# Patient Record
Sex: Female | Born: 1955
Health system: Southern US, Community
[De-identification: ages and names within clinical notes are randomized; demographics above are authoritative.]

## PROBLEM LIST (undated history)

## (undated) DIAGNOSIS — Z8489 Family history of other specified conditions: Secondary | ICD-10-CM

## (undated) DIAGNOSIS — G43909 Migraine, unspecified, not intractable, without status migrainosus: Secondary | ICD-10-CM

## (undated) DIAGNOSIS — C799 Secondary malignant neoplasm of unspecified site: Secondary | ICD-10-CM

## (undated) DIAGNOSIS — G9050 Complex regional pain syndrome I, unspecified: Secondary | ICD-10-CM

## (undated) DIAGNOSIS — J329 Chronic sinusitis, unspecified: Secondary | ICD-10-CM

## (undated) DIAGNOSIS — C50919 Malignant neoplasm of unspecified site of unspecified female breast: Secondary | ICD-10-CM

## (undated) DIAGNOSIS — M1990 Unspecified osteoarthritis, unspecified site: Secondary | ICD-10-CM

## (undated) DIAGNOSIS — E039 Hypothyroidism, unspecified: Secondary | ICD-10-CM

## (undated) DIAGNOSIS — C7951 Secondary malignant neoplasm of bone: Secondary | ICD-10-CM

## (undated) DIAGNOSIS — Z9221 Personal history of antineoplastic chemotherapy: Secondary | ICD-10-CM

## (undated) DIAGNOSIS — R42 Dizziness and giddiness: Secondary | ICD-10-CM

## (undated) DIAGNOSIS — E7800 Pure hypercholesterolemia, unspecified: Secondary | ICD-10-CM

## (undated) HISTORY — DX: Pure hypercholesterolemia, unspecified: E78.00

## (undated) HISTORY — PX: EYE SURGERY: SHX253

## (undated) HISTORY — DX: Hypothyroidism, unspecified: E03.9

## (undated) HISTORY — DX: Complex regional pain syndrome I, unspecified: G90.50

## (undated) HISTORY — PX: KNEE ARTHROSCOPY: SHX127

## (undated) HISTORY — PX: ROOT CANAL: SHX2363

## (undated) HISTORY — PX: KNEE SURGERY: SHX244

## (undated) HISTORY — DX: Chronic sinusitis, unspecified: J32.9

## (undated) HISTORY — DX: Migraine, unspecified, not intractable, without status migrainosus: G43.909

## (undated) HISTORY — PX: BREAST RECONSTRUCTION: SHX9

## (undated) HISTORY — PX: NASAL SINUS SURGERY: SHX719

## (undated) HISTORY — DX: Malignant neoplasm of unspecified site of unspecified female breast: C50.919

## (undated) HISTORY — PX: SHOULDER SURGERY: SHX246

---
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
## 2000-03-12 ENCOUNTER — Encounter (INDEPENDENT_AMBULATORY_CARE_PROVIDER_SITE_OTHER): Payer: Self-pay | Admitting: Specialist

## 2000-03-12 ENCOUNTER — Other Ambulatory Visit: Admission: RE | Admit: 2000-03-12 | Discharge: 2000-03-12 | Payer: Self-pay | Admitting: *Deleted

## 2001-05-08 ENCOUNTER — Other Ambulatory Visit: Admission: RE | Admit: 2001-05-08 | Discharge: 2001-05-08 | Payer: Self-pay | Admitting: Family Medicine

## 2003-03-08 ENCOUNTER — Ambulatory Visit (HOSPITAL_BASED_OUTPATIENT_CLINIC_OR_DEPARTMENT_OTHER): Admission: RE | Admit: 2003-03-08 | Discharge: 2003-03-08 | Payer: Self-pay | Admitting: Orthopaedic Surgery

## 2003-06-10 ENCOUNTER — Other Ambulatory Visit: Admission: RE | Admit: 2003-06-10 | Discharge: 2003-06-10 | Payer: Self-pay | Admitting: Family Medicine

## 2004-06-14 ENCOUNTER — Other Ambulatory Visit: Admission: RE | Admit: 2004-06-14 | Discharge: 2004-06-14 | Payer: Self-pay | Admitting: Family Medicine

## 2005-06-26 ENCOUNTER — Other Ambulatory Visit: Admission: RE | Admit: 2005-06-26 | Discharge: 2005-06-26 | Payer: Self-pay | Admitting: Family Medicine

## 2006-07-01 ENCOUNTER — Other Ambulatory Visit: Admission: RE | Admit: 2006-07-01 | Discharge: 2006-07-01 | Payer: Self-pay | Admitting: Family Medicine

## 2006-09-11 ENCOUNTER — Ambulatory Visit (HOSPITAL_COMMUNITY): Admission: RE | Admit: 2006-09-11 | Discharge: 2006-09-11 | Payer: Self-pay | Admitting: Family Medicine

## 2008-04-15 DIAGNOSIS — C50919 Malignant neoplasm of unspecified site of unspecified female breast: Secondary | ICD-10-CM

## 2008-04-15 HISTORY — DX: Malignant neoplasm of unspecified site of unspecified female breast: C50.919

## 2008-10-12 ENCOUNTER — Encounter: Admission: RE | Admit: 2008-10-12 | Discharge: 2008-10-12 | Payer: Self-pay | Admitting: Family Medicine

## 2008-12-14 HISTORY — PX: PORTA CATH INSERTION: CATH118285

## 2008-12-21 ENCOUNTER — Encounter: Admission: RE | Admit: 2008-12-21 | Discharge: 2008-12-21 | Payer: Self-pay | Admitting: Radiology

## 2008-12-23 ENCOUNTER — Ambulatory Visit: Payer: Self-pay | Admitting: Oncology

## 2009-01-04 LAB — CBC WITH DIFFERENTIAL/PLATELET
Basophils Absolute: 0 10*3/uL (ref 0.0–0.1)
Eosinophils Absolute: 0 10*3/uL (ref 0.0–0.5)
HCT: 36.3 % (ref 34.8–46.6)
HGB: 12.6 g/dL (ref 11.6–15.9)
MCH: 32.1 pg (ref 25.1–34.0)
MCV: 92.1 fL (ref 79.5–101.0)
MONO%: 8.5 % (ref 0.0–14.0)
NEUT#: 2 10*3/uL (ref 1.5–6.5)
NEUT%: 44.3 % (ref 38.4–76.8)
Platelets: 344 10*3/uL (ref 145–400)
RDW: 12.7 % (ref 11.2–14.5)

## 2009-01-05 LAB — COMPREHENSIVE METABOLIC PANEL
Albumin: 4.4 g/dL (ref 3.5–5.2)
Alkaline Phosphatase: 72 U/L (ref 39–117)
BUN: 18 mg/dL (ref 6–23)
Calcium: 9.5 mg/dL (ref 8.4–10.5)
Creatinine, Ser: 0.74 mg/dL (ref 0.40–1.20)
Glucose, Bld: 92 mg/dL (ref 70–99)
Potassium: 3.9 mEq/L (ref 3.5–5.3)

## 2009-01-05 LAB — LACTATE DEHYDROGENASE: LDH: 145 U/L (ref 94–250)

## 2009-01-05 LAB — VITAMIN D 25 HYDROXY (VIT D DEFICIENCY, FRACTURES): Vit D, 25-Hydroxy: 35 ng/mL (ref 30–89)

## 2009-01-05 LAB — CANCER ANTIGEN 27.29: CA 27.29: 28 U/mL (ref 0–39)

## 2009-01-06 ENCOUNTER — Ambulatory Visit (HOSPITAL_COMMUNITY): Admission: RE | Admit: 2009-01-06 | Discharge: 2009-01-06 | Payer: Self-pay | Admitting: Oncology

## 2009-01-11 ENCOUNTER — Ambulatory Visit: Payer: Self-pay

## 2009-01-11 ENCOUNTER — Encounter: Payer: Self-pay | Admitting: Cardiology

## 2009-01-11 ENCOUNTER — Encounter: Payer: Self-pay | Admitting: Oncology

## 2009-01-13 ENCOUNTER — Ambulatory Visit (HOSPITAL_BASED_OUTPATIENT_CLINIC_OR_DEPARTMENT_OTHER): Admission: RE | Admit: 2009-01-13 | Discharge: 2009-01-13 | Payer: Self-pay | Admitting: General Surgery

## 2009-01-18 ENCOUNTER — Ambulatory Visit (HOSPITAL_COMMUNITY): Admission: RE | Admit: 2009-01-18 | Discharge: 2009-01-18 | Payer: Self-pay | Admitting: Oncology

## 2009-01-25 ENCOUNTER — Ambulatory Visit: Payer: Self-pay | Admitting: Oncology

## 2009-02-03 LAB — CBC WITH DIFFERENTIAL/PLATELET
BASO%: 0.7 % (ref 0.0–2.0)
EOS%: 10.3 % — ABNORMAL HIGH (ref 0.0–7.0)
HCT: 35.5 % (ref 34.8–46.6)
HGB: 12 g/dL (ref 11.6–15.9)
MCH: 30.9 pg (ref 25.1–34.0)
MCHC: 33.8 g/dL (ref 31.5–36.0)
MONO#: 0 10*3/uL — ABNORMAL LOW (ref 0.1–0.9)
NEUT%: 4.4 % — ABNORMAL LOW (ref 38.4–76.8)
RDW: 12.4 % (ref 11.2–14.5)
WBC: 1.4 10*3/uL — ABNORMAL LOW (ref 3.9–10.3)
lymph#: 1.1 10*3/uL (ref 0.9–3.3)

## 2009-02-10 LAB — CBC WITH DIFFERENTIAL/PLATELET
Basophils Absolute: 0 10*3/uL (ref 0.0–0.1)
Eosinophils Absolute: 0 10*3/uL (ref 0.0–0.5)
HCT: 34.5 % — ABNORMAL LOW (ref 34.8–46.6)
HGB: 11.8 g/dL (ref 11.6–15.9)
MONO#: 0.9 10*3/uL (ref 0.1–0.9)
NEUT#: 10.3 10*3/uL — ABNORMAL HIGH (ref 1.5–6.5)
NEUT%: 80.1 % — ABNORMAL HIGH (ref 38.4–76.8)
RDW: 12.7 % (ref 11.2–14.5)
lymph#: 1.6 10*3/uL (ref 0.9–3.3)

## 2009-02-16 LAB — CBC WITH DIFFERENTIAL/PLATELET
Basophils Absolute: 0 10*3/uL (ref 0.0–0.1)
EOS%: 0.2 % (ref 0.0–7.0)
Eosinophils Absolute: 0 10*3/uL (ref 0.0–0.5)
HCT: 32.5 % — ABNORMAL LOW (ref 34.8–46.6)
HGB: 11.5 g/dL — ABNORMAL LOW (ref 11.6–15.9)
MCH: 32.7 pg (ref 25.1–34.0)
NEUT#: 0.9 10*3/uL — ABNORMAL LOW (ref 1.5–6.5)
NEUT%: 60.9 % (ref 38.4–76.8)
lymph#: 0.6 10*3/uL — ABNORMAL LOW (ref 0.9–3.3)

## 2009-02-24 ENCOUNTER — Ambulatory Visit: Payer: Self-pay | Admitting: Oncology

## 2009-02-24 LAB — CBC WITH DIFFERENTIAL/PLATELET
Basophils Absolute: 0.1 10*3/uL (ref 0.0–0.1)
EOS%: 0 % (ref 0.0–7.0)
Eosinophils Absolute: 0 10*3/uL (ref 0.0–0.5)
HCT: 32.7 % — ABNORMAL LOW (ref 34.8–46.6)
HGB: 10.6 g/dL — ABNORMAL LOW (ref 11.6–15.9)
LYMPH%: 9.2 % — ABNORMAL LOW (ref 14.0–49.7)
MCH: 30.5 pg (ref 25.1–34.0)
MCV: 94.2 fL (ref 79.5–101.0)
MONO%: 7.7 % (ref 0.0–14.0)
NEUT%: 82.6 % — ABNORMAL HIGH (ref 38.4–76.8)
Platelets: 194 10*3/uL (ref 145–400)
RDW: 14 % (ref 11.2–14.5)

## 2009-03-03 LAB — CBC WITH DIFFERENTIAL/PLATELET
EOS%: 0.2 % (ref 0.0–7.0)
LYMPH%: 29.8 % (ref 14.0–49.7)
MCH: 32 pg (ref 25.1–34.0)
MCHC: 34.3 g/dL (ref 31.5–36.0)
MCV: 93.3 fL (ref 79.5–101.0)
MONO%: 5.4 % (ref 0.0–14.0)
Platelets: 118 10*3/uL — ABNORMAL LOW (ref 145–400)
RBC: 2.98 10*6/uL — ABNORMAL LOW (ref 3.70–5.45)
RDW: 13.3 % (ref 11.2–14.5)

## 2009-03-08 LAB — COMPREHENSIVE METABOLIC PANEL
ALT: 39 U/L — ABNORMAL HIGH (ref 0–35)
AST: 29 U/L (ref 0–37)
Albumin: 4 g/dL (ref 3.5–5.2)
BUN: 9 mg/dL (ref 6–23)
CO2: 30 mEq/L (ref 19–32)
Calcium: 9.1 mg/dL (ref 8.4–10.5)
Chloride: 104 mEq/L (ref 96–112)
Creatinine, Ser: 0.69 mg/dL (ref 0.40–1.20)
Potassium: 3.6 mEq/L (ref 3.5–5.3)

## 2009-03-08 LAB — CBC WITH DIFFERENTIAL/PLATELET
Basophils Absolute: 0 10*3/uL (ref 0.0–0.1)
HCT: 29.3 % — ABNORMAL LOW (ref 34.8–46.6)
HGB: 9.7 g/dL — ABNORMAL LOW (ref 11.6–15.9)
MONO#: 0.3 10*3/uL (ref 0.1–0.9)
NEUT#: 12.9 10*3/uL — ABNORMAL HIGH (ref 1.5–6.5)
NEUT%: 90 % — ABNORMAL HIGH (ref 38.4–76.8)
RDW: 13.7 % (ref 11.2–14.5)
WBC: 14.3 10*3/uL — ABNORMAL HIGH (ref 3.9–10.3)
lymph#: 1.1 10*3/uL (ref 0.9–3.3)

## 2009-03-16 LAB — CBC WITH DIFFERENTIAL/PLATELET
BASO%: 0.1 % (ref 0.0–2.0)
Basophils Absolute: 0 10*3/uL (ref 0.0–0.1)
HCT: 26.3 % — ABNORMAL LOW (ref 34.8–46.6)
HGB: 9.2 g/dL — ABNORMAL LOW (ref 11.6–15.9)
MONO#: 0 10*3/uL — ABNORMAL LOW (ref 0.1–0.9)
NEUT%: 88.7 % — ABNORMAL HIGH (ref 38.4–76.8)
WBC: 3 10*3/uL — ABNORMAL LOW (ref 3.9–10.3)
lymph#: 0.3 10*3/uL — ABNORMAL LOW (ref 0.9–3.3)

## 2009-03-21 ENCOUNTER — Encounter: Admission: RE | Admit: 2009-03-21 | Discharge: 2009-03-21 | Payer: Self-pay | Admitting: Oncology

## 2009-03-27 ENCOUNTER — Ambulatory Visit: Payer: Self-pay | Admitting: Oncology

## 2009-04-15 HISTORY — PX: MASTECTOMY: SHX3

## 2009-05-16 HISTORY — PX: PORTA CATH REMOVAL: CATH118286

## 2009-05-22 ENCOUNTER — Encounter (INDEPENDENT_AMBULATORY_CARE_PROVIDER_SITE_OTHER): Payer: Self-pay | Admitting: General Surgery

## 2009-05-22 ENCOUNTER — Ambulatory Visit (HOSPITAL_COMMUNITY): Admission: RE | Admit: 2009-05-22 | Discharge: 2009-05-24 | Payer: Self-pay | Admitting: General Surgery

## 2009-05-22 HISTORY — PX: MASTECTOMY: SHX3

## 2009-05-25 ENCOUNTER — Ambulatory Visit: Payer: Self-pay | Admitting: Oncology

## 2009-05-29 LAB — CBC WITH DIFFERENTIAL/PLATELET
BASO%: 0.2 % (ref 0.0–2.0)
EOS%: 5.3 % (ref 0.0–7.0)
HCT: 34.6 % — ABNORMAL LOW (ref 34.8–46.6)
HGB: 11.7 g/dL (ref 11.6–15.9)
MCH: 31.6 pg (ref 25.1–34.0)
MCV: 93.6 fL (ref 79.5–101.0)
MONO%: 8.5 % (ref 0.0–14.0)
NEUT%: 64.5 % (ref 38.4–76.8)
Platelets: 408 10*3/uL — ABNORMAL HIGH (ref 145–400)
lymph#: 1 10*3/uL (ref 0.9–3.3)

## 2009-05-29 LAB — CANCER ANTIGEN 27.29: CA 27.29: 22 U/mL (ref 0–39)

## 2009-05-29 LAB — COMPREHENSIVE METABOLIC PANEL
ALT: 70 U/L — ABNORMAL HIGH (ref 0–35)
BUN: 11 mg/dL (ref 6–23)
CO2: 28 mEq/L (ref 19–32)
Chloride: 102 mEq/L (ref 96–112)
Glucose, Bld: 119 mg/dL — ABNORMAL HIGH (ref 70–99)
Potassium: 4 mEq/L (ref 3.5–5.3)
Sodium: 140 mEq/L (ref 135–145)

## 2009-05-29 LAB — VITAMIN D 25 HYDROXY (VIT D DEFICIENCY, FRACTURES): Vit D, 25-Hydroxy: 39 ng/mL (ref 30–89)

## 2009-05-29 LAB — LACTATE DEHYDROGENASE: LDH: 137 U/L (ref 94–250)

## 2009-07-14 ENCOUNTER — Ambulatory Visit: Payer: Self-pay | Admitting: Oncology

## 2009-07-18 LAB — HEPATIC FUNCTION PANEL
Bilirubin, Direct: 0.1 mg/dL (ref 0.0–0.3)
Total Bilirubin: 0.4 mg/dL (ref 0.3–1.2)

## 2009-07-18 LAB — GAMMA GT: GGT: 17 U/L (ref 7–51)

## 2009-08-14 ENCOUNTER — Other Ambulatory Visit: Admission: RE | Admit: 2009-08-14 | Discharge: 2009-08-14 | Payer: Self-pay | Admitting: Family Medicine

## 2009-11-24 ENCOUNTER — Ambulatory Visit: Payer: Self-pay | Admitting: Oncology

## 2009-11-28 LAB — CBC WITH DIFFERENTIAL/PLATELET
BASO%: 0.5 % (ref 0.0–2.0)
Basophils Absolute: 0 10*3/uL (ref 0.0–0.1)
HCT: 38 % (ref 34.8–46.6)
MCH: 32.2 pg (ref 25.1–34.0)
MONO%: 8.9 % (ref 0.0–14.0)
NEUT%: 52.5 % (ref 38.4–76.8)
Platelets: 356 10*3/uL (ref 145–400)
RDW: 12.7 % (ref 11.2–14.5)
WBC: 4 10*3/uL (ref 3.9–10.3)
lymph#: 1.4 10*3/uL (ref 0.9–3.3)

## 2009-11-28 LAB — COMPREHENSIVE METABOLIC PANEL
ALT: 10 U/L (ref 0–35)
Albumin: 4.6 g/dL (ref 3.5–5.2)
Alkaline Phosphatase: 79 U/L (ref 39–117)
BUN: 19 mg/dL (ref 6–23)
CO2: 25 mEq/L (ref 19–32)
Creatinine, Ser: 0.81 mg/dL (ref 0.40–1.20)
Glucose, Bld: 98 mg/dL (ref 70–99)
Sodium: 140 mEq/L (ref 135–145)
Total Protein: 7 g/dL (ref 6.0–8.3)

## 2009-11-28 LAB — CANCER ANTIGEN 27.29: CA 27.29: 25 U/mL (ref 0–39)

## 2009-11-28 LAB — VITAMIN D 25 HYDROXY (VIT D DEFICIENCY, FRACTURES): Vit D, 25-Hydroxy: 47 ng/mL (ref 30–89)

## 2009-11-28 LAB — LACTATE DEHYDROGENASE: LDH: 130 U/L (ref 94–250)

## 2009-12-06 ENCOUNTER — Ambulatory Visit (HOSPITAL_BASED_OUTPATIENT_CLINIC_OR_DEPARTMENT_OTHER): Admission: RE | Admit: 2009-12-06 | Discharge: 2009-12-06 | Payer: Self-pay | Admitting: Plastic Surgery

## 2010-02-21 ENCOUNTER — Ambulatory Visit: Payer: Self-pay | Admitting: Pain Medicine

## 2010-02-21 ENCOUNTER — Other Ambulatory Visit: Payer: Self-pay | Admitting: Pain Medicine

## 2010-05-06 ENCOUNTER — Encounter: Payer: Self-pay | Admitting: Oncology

## 2010-05-18 ENCOUNTER — Other Ambulatory Visit: Payer: Self-pay | Admitting: Oncology

## 2010-05-18 DIAGNOSIS — Z853 Personal history of malignant neoplasm of breast: Secondary | ICD-10-CM

## 2010-05-18 DIAGNOSIS — Z780 Asymptomatic menopausal state: Secondary | ICD-10-CM

## 2010-05-18 DIAGNOSIS — M898X9 Other specified disorders of bone, unspecified site: Secondary | ICD-10-CM

## 2010-05-23 ENCOUNTER — Ambulatory Visit: Payer: Self-pay | Admitting: Pain Medicine

## 2010-05-31 ENCOUNTER — Other Ambulatory Visit: Payer: Self-pay | Admitting: Oncology

## 2010-05-31 ENCOUNTER — Encounter (HOSPITAL_BASED_OUTPATIENT_CLINIC_OR_DEPARTMENT_OTHER): Payer: 59 | Admitting: Oncology

## 2010-05-31 DIAGNOSIS — C50319 Malignant neoplasm of lower-inner quadrant of unspecified female breast: Secondary | ICD-10-CM

## 2010-05-31 LAB — CBC WITH DIFFERENTIAL/PLATELET
BASO%: 0.5 % (ref 0.0–2.0)
EOS%: 1.8 % (ref 0.0–7.0)
HCT: 36.8 % (ref 34.8–46.6)
LYMPH%: 34.5 % (ref 14.0–49.7)
MCH: 32.2 pg (ref 25.1–34.0)
MCHC: 34.5 g/dL (ref 31.5–36.0)
MCV: 93.3 fL (ref 79.5–101.0)
MONO%: 7.6 % (ref 0.0–14.0)
Platelets: 348 10*3/uL (ref 145–400)
RBC: 3.95 10*6/uL (ref 3.70–5.45)
WBC: 4.6 10*3/uL (ref 3.9–10.3)
lymph#: 1.6 10*3/uL (ref 0.9–3.3)

## 2010-06-01 LAB — COMPREHENSIVE METABOLIC PANEL
ALT: 12 U/L (ref 0–35)
AST: 18 U/L (ref 0–37)
Albumin: 4.5 g/dL (ref 3.5–5.2)
Alkaline Phosphatase: 81 U/L (ref 39–117)
BUN: 21 mg/dL (ref 6–23)
CO2: 29 mEq/L (ref 19–32)
Calcium: 9.4 mg/dL (ref 8.4–10.5)
Chloride: 101 mEq/L (ref 96–112)
Creatinine, Ser: 0.7 mg/dL (ref 0.40–1.20)
Glucose, Bld: 114 mg/dL — ABNORMAL HIGH (ref 70–99)
Potassium: 4.1 mEq/L (ref 3.5–5.3)
Sodium: 139 mEq/L (ref 135–145)
Total Bilirubin: 0.3 mg/dL (ref 0.3–1.2)
Total Protein: 6.8 g/dL (ref 6.0–8.3)

## 2010-06-01 LAB — VITAMIN D 25 HYDROXY (VIT D DEFICIENCY, FRACTURES): Vit D, 25-Hydroxy: 49 ng/mL (ref 30–89)

## 2010-06-01 LAB — CANCER ANTIGEN 27.29: CA 27.29: 22 U/mL (ref 0–39)

## 2010-06-07 ENCOUNTER — Encounter (HOSPITAL_BASED_OUTPATIENT_CLINIC_OR_DEPARTMENT_OTHER): Payer: 59 | Admitting: Oncology

## 2010-06-07 DIAGNOSIS — C50319 Malignant neoplasm of lower-inner quadrant of unspecified female breast: Secondary | ICD-10-CM

## 2010-06-07 DIAGNOSIS — Z170 Estrogen receptor positive status [ER+]: Secondary | ICD-10-CM

## 2010-06-25 ENCOUNTER — Ambulatory Visit
Admission: RE | Admit: 2010-06-25 | Discharge: 2010-06-25 | Disposition: A | Discharge status: Home / Self Care | Payer: 59 | Source: Ambulatory Visit | Attending: Chiropractic Medicine | Admitting: Chiropractic Medicine

## 2010-06-25 ENCOUNTER — Other Ambulatory Visit: Payer: Self-pay | Admitting: Chiropractic Medicine

## 2010-06-25 DIAGNOSIS — M542 Cervicalgia: Secondary | ICD-10-CM

## 2010-06-25 DIAGNOSIS — M5416 Radiculopathy, lumbar region: Secondary | ICD-10-CM

## 2010-07-04 LAB — COMPREHENSIVE METABOLIC PANEL
AST: 36 U/L (ref 0–37)
Albumin: 4.3 g/dL (ref 3.5–5.2)
Alkaline Phosphatase: 68 U/L (ref 39–117)
Chloride: 101 mEq/L (ref 96–112)
Creatinine, Ser: 0.78 mg/dL (ref 0.4–1.2)
GFR calc Af Amer: >60 mL/min (ref >60)
Potassium: 3.6 mEq/L (ref 3.5–5.1)
Total Bilirubin: 0.4 mg/dL (ref 0.3–1.2)
Total Protein: 7.1 g/dL (ref 6.0–8.3)

## 2010-07-04 LAB — CBC
HCT: 26.9 % — ABNORMAL LOW (ref 36.0–46.0)
Hemoglobin: 9.1 g/dL — ABNORMAL LOW (ref 12.0–15.0)
MCHC: 34 g/dL (ref 30.0–36.0)
MCV: 96.2 fL (ref 78.0–100.0)
Platelets: 316 10*3/uL (ref 150–400)
RBC: 2.8 MIL/uL — ABNORMAL LOW (ref 3.87–5.11)
RDW: 14.4 % (ref 11.5–15.5)
WBC: 3.7 10*3/uL — ABNORMAL LOW (ref 4.0–10.5)
WBC: 6.4 10*3/uL (ref 4.0–10.5)

## 2010-07-04 LAB — DIFFERENTIAL
Basophils Absolute: 0 10*3/uL (ref 0.0–0.1)
Eosinophils Relative: 2 % (ref 0–5)
Lymphocytes Relative: 33 % (ref 12–46)
Monocytes Absolute: 0.4 10*3/uL (ref 0.1–1.0)
Monocytes Relative: 10 % (ref 3–12)

## 2010-07-04 LAB — URINALYSIS, ROUTINE W REFLEX MICROSCOPIC
Bilirubin Urine: NEGATIVE
Glucose, UA: NEGATIVE mg/dL
Hgb urine dipstick: NEGATIVE
Specific Gravity, Urine: 1.012 (ref 1.005–1.030)
Urobilinogen, UA: 0.2 mg/dL (ref 0.0–1.0)

## 2010-07-04 LAB — CANCER ANTIGEN 27.29: CA 27.29: 26 U/mL (ref 0–39)

## 2010-07-19 LAB — POCT HEMOGLOBIN-HEMACUE: Hemoglobin: 13.3 g/dL (ref 12.0–15.0)

## 2010-08-16 ENCOUNTER — Other Ambulatory Visit: Payer: Self-pay | Admitting: Physician Assistant

## 2010-08-16 ENCOUNTER — Other Ambulatory Visit (HOSPITAL_COMMUNITY)
Admission: RE | Admit: 2010-08-16 | Discharge: 2010-08-16 | Disposition: A | Discharge status: Home / Self Care | Payer: 59 | Source: Ambulatory Visit | Attending: Family Medicine | Admitting: Family Medicine

## 2010-08-16 DIAGNOSIS — Z124 Encounter for screening for malignant neoplasm of cervix: Secondary | ICD-10-CM | POA: Insufficient documentation

## 2010-08-31 NOTE — Op Note (Signed)
NAME:  APPLEErandi, Lemma                           ACCOUNT NO.:  [id number]   MEDICAL RECORD NO.:  [id number]                   PATIENT TYPE:  AMB   LOCATION:  DSC                                  FACILITY:  [facility]   PHYSICIAN:  Lubertha Basque. Jerl Santos, M.D.             DATE OF BIRTH:  1955/09/18   DATE OF PROCEDURE:  03/08/2003  DATE OF DISCHARGE:                                 OPERATIVE REPORT   PREOPERATIVE DIAGNOSES:  1. Right shoulder impingement.  2. Right shoulder acromioclavicular joint pain.   POSTOPERATIVE DIAGNOSES:  1. Right shoulder impingement.  2. Right shoulder acromioclavicular joint pain.   PROCEDURE:  1. Right shoulder arthroscopic acromioplasty.  2. Right shoulder AC resection.   ANESTHESIA:  General and block.   SURGEON:  Lubertha Basque. Jerl Santos, M.D.   ASSISTANT:  Lindwood Qua, P.A.   INDICATIONS FOR PROCEDURE:  The patient is a 55 year old woman with 5 or 6  months of right shoulder pain. This has persisted despite oral anti-  inflammatories and an exercise program. She responded in a transient fashion  to a subacromial injection. By x-ray and MRI she has things consistent with  impingement. She has pain at rest and pain with activity and is offered an  arthroscopy. Informed operative consent was obtained after a discussion of  possible complications of reaction to anesthesia and infection.   DESCRIPTION OF PROCEDURE:  The patient was brought to the operating suite  where general anesthesia was applied without difficulty. She was also given  a block in the preanesthesia area. She was positioned in the beachchair  position and prepped and draped in the normal sterile fashion.   After administration of preoperative IV antibiotics, an arthroscopy of the  right shoulder was performed through a total of 3 portals. The glenohumeral  joint showed no degenerative  change  and  the  biceps tendon and labral  structures were all intact. The rotator cuff appeared  benign  from below. In  the subacromial space she had an abundance of bursitis and some things  consistent with partial thickness rotator cuff tear. A thorough debridement  was done of the subacromial space. No full thickness tear was seen  consistent with her MRI.   She had a prominent subacromial morphology addressed with an acromioplasty  back  to a flat surface. This was done with the bur in the lateral  position, followed  by transfer of the bur to the posterior position. She  did have bone-on-bone contact at the acromioclavicular joint and a superior  spur on the distal clavicle. An arthroscopic AC resection done through the  additional anterior portal.   The shoulder was thoroughly irrigated at the end of the case, followed  by  placement of epinephrine and morphine  at the end of the case. Simple  sutures of nylon loosely  reapproximated the portals followed  by Adaptic  and a dry gauze  dressing  with tape.  Estimated blood loss and  intraoperative fluids can be obtained  from the anesthesia records.   DISPOSITION:  The patient was extubated in the operating room and taken to  the recovery room in stable condition. Plans for her to go home on the same  day and follow up in the office in one week. I will contact her  by phone  tonight.                                               Lubertha Basque Jerl Santos, M.D.    PGD/MEDQ  D:  03/08/2003  T:  03/08/2003  Job:  [id number]

## 2010-09-04 ENCOUNTER — Other Ambulatory Visit: Payer: Self-pay | Admitting: Dermatology

## 2010-10-19 ENCOUNTER — Ambulatory Visit
Admission: RE | Admit: 2010-10-19 | Discharge: 2010-10-19 | Disposition: A | Discharge status: Home / Self Care | Payer: 59 | Source: Ambulatory Visit | Attending: Oncology | Admitting: Oncology

## 2010-10-19 DIAGNOSIS — Z853 Personal history of malignant neoplasm of breast: Secondary | ICD-10-CM

## 2010-10-19 DIAGNOSIS — Z780 Asymptomatic menopausal state: Secondary | ICD-10-CM

## 2010-11-29 ENCOUNTER — Encounter (HOSPITAL_BASED_OUTPATIENT_CLINIC_OR_DEPARTMENT_OTHER): Payer: 59 | Admitting: Oncology

## 2010-11-29 ENCOUNTER — Other Ambulatory Visit: Payer: Self-pay | Admitting: Oncology

## 2010-11-29 DIAGNOSIS — Z170 Estrogen receptor positive status [ER+]: Secondary | ICD-10-CM

## 2010-11-29 DIAGNOSIS — C50319 Malignant neoplasm of lower-inner quadrant of unspecified female breast: Secondary | ICD-10-CM

## 2010-11-29 LAB — COMPREHENSIVE METABOLIC PANEL
ALT: 12 U/L (ref 0–35)
AST: 17 U/L (ref 0–37)
BUN: 17 mg/dL (ref 6–23)
Calcium: 9.5 mg/dL (ref 8.4–10.5)
Chloride: 105 mEq/L (ref 96–112)
Creatinine, Ser: 0.76 mg/dL (ref 0.50–1.10)
Total Bilirubin: 0.4 mg/dL (ref 0.3–1.2)

## 2010-11-29 LAB — CBC WITH DIFFERENTIAL/PLATELET
BASO%: 0.7 % (ref 0.0–2.0)
Basophils Absolute: 0 10*3/uL (ref 0.0–0.1)
EOS%: 2.3 % (ref 0.0–7.0)
HCT: 37.5 % (ref 34.8–46.6)
HGB: 13.1 g/dL (ref 11.6–15.9)
LYMPH%: 40.4 % (ref 14.0–49.7)
MCH: 32.3 pg (ref 25.1–34.0)
MCHC: 35 g/dL (ref 31.5–36.0)
MCV: 92.2 fL (ref 79.5–101.0)
NEUT%: 47.1 % (ref 38.4–76.8)
Platelets: 307 10*3/uL (ref 145–400)
lymph#: 1.5 10*3/uL (ref 0.9–3.3)

## 2010-11-29 LAB — VITAMIN D 25 HYDROXY (VIT D DEFICIENCY, FRACTURES): Vit D, 25-Hydroxy: 52 ng/mL (ref 30–89)

## 2010-11-29 LAB — CANCER ANTIGEN 27.29: CA 27.29: 24 U/mL (ref 0–39)

## 2010-12-06 ENCOUNTER — Encounter: Payer: 59 | Admitting: Oncology

## 2011-03-30 ENCOUNTER — Telehealth: Payer: Self-pay | Admitting: *Deleted

## 2011-03-30 NOTE — Telephone Encounter (Signed)
left message to inform the patient of her new date and time on 05-2011 printed out calendar and maield out to the patient

## 2011-05-24 ENCOUNTER — Other Ambulatory Visit: Payer: 59 | Admitting: Lab

## 2011-05-31 ENCOUNTER — Ambulatory Visit: Payer: 59 | Admitting: Oncology

## 2011-06-26 ENCOUNTER — Other Ambulatory Visit (HOSPITAL_BASED_OUTPATIENT_CLINIC_OR_DEPARTMENT_OTHER): Payer: PRIVATE HEALTH INSURANCE | Admitting: Lab

## 2011-06-26 ENCOUNTER — Other Ambulatory Visit: Payer: Self-pay | Admitting: Oncology

## 2011-06-26 DIAGNOSIS — C50919 Malignant neoplasm of unspecified site of unspecified female breast: Secondary | ICD-10-CM

## 2011-06-26 DIAGNOSIS — E559 Vitamin D deficiency, unspecified: Secondary | ICD-10-CM

## 2011-06-26 DIAGNOSIS — D702 Other drug-induced agranulocytosis: Secondary | ICD-10-CM

## 2011-06-26 DIAGNOSIS — C50319 Malignant neoplasm of lower-inner quadrant of unspecified female breast: Secondary | ICD-10-CM

## 2011-06-26 LAB — CBC WITH DIFFERENTIAL/PLATELET
Basophils Absolute: 0 10*3/uL (ref 0.0–0.1)
Eosinophils Absolute: 0.1 10*3/uL (ref 0.0–0.5)
HGB: 12.5 g/dL (ref 11.6–15.9)
MCV: 93.5 fL (ref 79.5–101.0)
MONO#: 0.3 10*3/uL (ref 0.1–0.9)
MONO%: 8.9 % (ref 0.0–14.0)
NEUT#: 1.5 10*3/uL (ref 1.5–6.5)
RBC: 3.94 10*6/uL (ref 3.70–5.45)
RDW: 13.1 % (ref 11.2–14.5)
WBC: 3.7 10*3/uL — ABNORMAL LOW (ref 3.9–10.3)
lymph#: 1.7 10*3/uL (ref 0.9–3.3)

## 2011-06-27 ENCOUNTER — Other Ambulatory Visit: Payer: Self-pay | Admitting: Oncology

## 2011-06-27 DIAGNOSIS — C50319 Malignant neoplasm of lower-inner quadrant of unspecified female breast: Secondary | ICD-10-CM

## 2011-06-27 DIAGNOSIS — Z170 Estrogen receptor positive status [ER+]: Secondary | ICD-10-CM

## 2011-06-27 DIAGNOSIS — C50919 Malignant neoplasm of unspecified site of unspecified female breast: Secondary | ICD-10-CM

## 2011-06-27 LAB — COMPREHENSIVE METABOLIC PANEL
Albumin: 4.4 g/dL (ref 3.5–5.2)
Alkaline Phosphatase: 72 U/L (ref 39–117)
BUN: 13 mg/dL (ref 6–23)
Calcium: 9.4 mg/dL (ref 8.4–10.5)
Chloride: 103 mEq/L (ref 96–112)
Glucose, Bld: 95 mg/dL (ref 70–99)
Potassium: 4.5 mEq/L (ref 3.5–5.3)
Sodium: 140 mEq/L (ref 135–145)
Total Protein: 6.6 g/dL (ref 6.0–8.3)

## 2011-06-27 LAB — VITAMIN D 25 HYDROXY (VIT D DEFICIENCY, FRACTURES): Vit D, 25-Hydroxy: 50 ng/mL (ref 30–89)

## 2011-07-03 ENCOUNTER — Ambulatory Visit (HOSPITAL_BASED_OUTPATIENT_CLINIC_OR_DEPARTMENT_OTHER): Payer: PRIVATE HEALTH INSURANCE | Admitting: Oncology

## 2011-07-03 ENCOUNTER — Telehealth: Payer: Self-pay | Admitting: *Deleted

## 2011-07-03 VITALS — BP 151/94 | HR 92 | Temp 98.3°F | Ht 64.5 in | Wt 127.2 lb

## 2011-07-03 DIAGNOSIS — E559 Vitamin D deficiency, unspecified: Secondary | ICD-10-CM

## 2011-07-03 DIAGNOSIS — Z170 Estrogen receptor positive status [ER+]: Secondary | ICD-10-CM

## 2011-07-03 DIAGNOSIS — C50319 Malignant neoplasm of lower-inner quadrant of unspecified female breast: Secondary | ICD-10-CM

## 2011-07-03 DIAGNOSIS — C50919 Malignant neoplasm of unspecified site of unspecified female breast: Secondary | ICD-10-CM

## 2011-07-03 NOTE — Patient Instructions (Signed)
   For Hot Flashes  Take peridin C , over the counter medicine, 2 tablets up to 3 times a day.

## 2011-07-03 NOTE — Telephone Encounter (Signed)
gave patient appointment for 01-2012 with labs one week before appointment with the doctor

## 2011-08-05 NOTE — Progress Notes (Signed)
Hematology and Oncology Follow Up Visit  Elizabeth [name] [id number] 03-03-1956 56 y.o. 08/05/2011 8:06 PM PCP   Principle Diagnosis:  1. Advanced ER/PR positive, HER-2/neu negative breast cancer, status post 4 cycles of dose-dense FEC.  Status post surgery 05/22/2009.  Status post bilateral expander.  Currently on Femara. 2. History of RSD with peripheral neuropathy.  3.   Interim History:  There have been no intercurrent illness, hospitalizations or medication changes. She remains on femara, which she is tolerating a bit better. Some hot flashes continue.  Medications: I have reviewed the patient's current medications.  Allergies:  Allergies  Allergen Reactions  . Shellfish Allergy Anaphylaxis    Allergist said she was "highly allergic to shellfish"  . Sulfa Antibiotics Rash    Looks like severe sunburn    Past Medical History, Surgical history, Social history, and Family History were reviewed and updated.  Review of Systems: Constitutional:  Negative for fever, chills, night sweats, anorexia, weight loss, pain. Cardiovascular: no chest pain or dyspnea on exertion Respiratory: negative Neurological: negative Dermatological: negative ENT: negative Skin Gastrointestinal: negative Genito-Urinary: negative Hematological and Lymphatic: negative Breast: negative Musculoskeletal: negative Remaining ROS negative.  Physical Exam: Blood pressure 151/94, pulse 92, temperature 98.3 F (36.8 C), temperature source Oral, height 5' 4.5" (1.638 m), weight 127 lb 3.2 oz (57.698 kg). ECOG: 0 General appearance: alert, cooperative and appears stated age Head: Normocephalic, without obvious abnormality, atraumatic Neck: no adenopathy, no carotid bruit, no JVD, supple, symmetrical, trachea midline and thyroid not enlarged, symmetric, no tenderness/mass/nodules Lymph nodes: Cervical, supraclavicular, and axillary nodes normal. Cardiac : regular rate and rhythm, no murmurs or  gallops Pulmonary:clear to auscultation bilaterally and normal percussion bilaterally Breasts: inspection negative, s/p bilateral implants, no evidence of local recurrence Abdomen:soft, non-tender; bowel sounds normal; no masses,  no organomegaly Extremities negative Neuro: alert, oriented, normal speech, no focal findings or movement disorder noted  Lab Results: Lab Results  Component Value Date   WBC 3.7* 06/26/2011   HGB 12.5 06/26/2011   HCT 36.9 06/26/2011   MCV 93.5 06/26/2011   PLT 338 06/26/2011     Chemistry      Component Value Date/Time   NA 140 06/26/2011 1251   K 4.5 06/26/2011 1251   CL 103 06/26/2011 1251   CO2 27 06/26/2011 1251   BUN 13 06/26/2011 1251   CREATININE 0.87 06/26/2011 1251      Component Value Date/Time   CALCIUM 9.4 06/26/2011 1251   ALKPHOS 72 06/26/2011 1251   AST 17 06/26/2011 1251   ALT 13 06/26/2011 1251   BILITOT 0.3 06/26/2011 1251      .pathology. Radiological Studies: chest X-ray n/a Mammogram n/a Bone density 7/12- wnl  Impression and Plan: Elizabeth [name] is doing well, she is tolerating femara, labs from 3/13- wnl; f/u in 6 months.  More than 50% of the visit was spent in patient-related counselling   Pierce Crane, MD 4/22/20138:06 PM

## 2011-08-21 ENCOUNTER — Other Ambulatory Visit (HOSPITAL_COMMUNITY)
Admission: RE | Admit: 2011-08-21 | Discharge: 2011-08-21 | Disposition: A | Discharge status: Home / Self Care | Payer: PRIVATE HEALTH INSURANCE | Source: Ambulatory Visit | Attending: Family Medicine | Admitting: Family Medicine

## 2011-08-21 ENCOUNTER — Other Ambulatory Visit: Payer: Self-pay | Admitting: Physician Assistant

## 2011-08-21 DIAGNOSIS — Z124 Encounter for screening for malignant neoplasm of cervix: Secondary | ICD-10-CM | POA: Insufficient documentation

## 2012-01-28 ENCOUNTER — Other Ambulatory Visit (HOSPITAL_BASED_OUTPATIENT_CLINIC_OR_DEPARTMENT_OTHER): Payer: PRIVATE HEALTH INSURANCE | Admitting: Lab

## 2012-01-28 DIAGNOSIS — E559 Vitamin D deficiency, unspecified: Secondary | ICD-10-CM

## 2012-01-28 DIAGNOSIS — C50919 Malignant neoplasm of unspecified site of unspecified female breast: Secondary | ICD-10-CM

## 2012-01-28 LAB — CBC WITH DIFFERENTIAL/PLATELET
Basophils Absolute: 0 10*3/uL (ref 0.0–0.1)
Eosinophils Absolute: 0.1 10*3/uL (ref 0.0–0.5)
HCT: 35.8 % (ref 34.8–46.6)
HGB: 12.5 g/dL (ref 11.6–15.9)
LYMPH%: 45.4 % (ref 14.0–49.7)
MCV: 93.5 fL (ref 79.5–101.0)
MONO%: 10.1 % (ref 0.0–14.0)
NEUT#: 1.7 10*3/uL (ref 1.5–6.5)
NEUT%: 42.1 % (ref 38.4–76.8)
Platelets: 309 10*3/uL (ref 145–400)
RDW: 12.9 % (ref 11.2–14.5)

## 2012-01-28 LAB — COMPREHENSIVE METABOLIC PANEL (CC13)
Albumin: 3.7 g/dL (ref 3.5–5.0)
Alkaline Phosphatase: 74 U/L (ref 40–150)
BUN: 16 mg/dL (ref 7.0–26.0)
Creatinine: 0.9 mg/dL (ref 0.6–1.1)
Glucose: 89 mg/dl (ref 70–99)
Potassium: 4.1 mEq/L (ref 3.5–5.1)

## 2012-01-29 LAB — CANCER ANTIGEN 27.29: CA 27.29: 22 U/mL (ref 0–39)

## 2012-02-04 ENCOUNTER — Ambulatory Visit (HOSPITAL_BASED_OUTPATIENT_CLINIC_OR_DEPARTMENT_OTHER): Payer: PRIVATE HEALTH INSURANCE | Admitting: Oncology

## 2012-02-04 ENCOUNTER — Telehealth: Payer: Self-pay | Admitting: Oncology

## 2012-02-04 VITALS — BP 103/73 | HR 102 | Temp 98.6°F | Resp 20 | Ht 64.5 in | Wt 123.6 lb

## 2012-02-04 DIAGNOSIS — C50919 Malignant neoplasm of unspecified site of unspecified female breast: Secondary | ICD-10-CM

## 2012-02-04 DIAGNOSIS — Z170 Estrogen receptor positive status [ER+]: Secondary | ICD-10-CM

## 2012-02-04 DIAGNOSIS — C50319 Malignant neoplasm of lower-inner quadrant of unspecified female breast: Secondary | ICD-10-CM

## 2012-02-04 NOTE — Telephone Encounter (Signed)
gve the pt her 6 mnth f/u for April,may 2014

## 2012-02-04 NOTE — Progress Notes (Signed)
Hematology and Oncology Follow Up Visit  Elizabeth [name] [id number] 1955/06/03 56 y.o. 02/04/2012 9:25 AM PCP   Principle Diagnosis:  1. Advanced ER/PR positive, HER-2/neu negative breast cancer, status post 4 cycles of dose-dense FEC.  Status post surgery, with bilateral mastectomy,   05/22/2009.  Status post bilateral expander.  Currently on Femara. 2. History of RSD with peripheral neuropathy.  3.   Interim History:  There have been no intercurrent illness, hospitalizations or medication changes. She remains on femara, which she is tolerating a bit better. Some hot flashes continue., Especially nighttime. She does take a paradigm C. with some modest relief. She is getting used to them. She continues to work full-time Density due in 2014.  Medications: I have reviewed the patient's current medications.  Allergies:  Allergies  Allergen Reactions  . Shellfish Allergy Anaphylaxis    Allergist said she was "highly allergic to shellfish"  . Sulfa Antibiotics Rash    Looks like severe sunburn    Past Medical History, Surgical history, Social history, and Family History were reviewed and updated.  Review of Systems: Constitutional:  Negative for fever, chills, night sweats, anorexia, weight loss, pain. Cardiovascular: no chest pain or dyspnea on exertion Respiratory: negative Neurological: negative Dermatological: negative ENT: negative Skin Gastrointestinal: negative Genito-Urinary: negative Hematological and Lymphatic: negative Breast: negative Musculoskeletal: negative Remaining ROS negative.  Physical Exam: Blood pressure 103/73, pulse 102, temperature 98.6 F (37 C), resp. rate 20, height 5' 4.5" (1.638 m), weight 123 lb 9.6 oz (56.065 kg). ECOG: 0 Slightly anxious woman General appearance: alert, cooperative and appears stated age Head: Normocephalic, without obvious abnormality, atraumatic Neck: no adenopathy, no carotid bruit, no JVD, supple, symmetrical, trachea  midline and thyroid not enlarged, symmetric, no tenderness/mass/nodules Lymph nodes: Cervical, supraclavicular, and axillary nodes normal. Cardiac : regular rate and rhythm, no murmurs or gallops Pulmonary:clear to auscultation bilaterally and normal percussion bilaterally Breasts: inspection negative, s/p bilateral implants, no evidence of local recurrence Abdomen:soft, non-tender; bowel sounds normal; no masses,  no organomegaly Extremities negative Neuro: alert, oriented, normal speech, no focal findings or movement disorder noted  Lab Results: Lab Results  Component Value Date   WBC 4.0 01/28/2012   HGB 12.5 01/28/2012   HCT 35.8 01/28/2012   MCV 93.5 01/28/2012   PLT 309 01/28/2012     Chemistry      Component Value Date/Time   NA 140 01/28/2012 1230   NA 140 06/26/2011 1251   K 4.1 01/28/2012 1230   K 4.5 06/26/2011 1251   CL 105 01/28/2012 1230   CL 103 06/26/2011 1251   CO2 27 01/28/2012 1230   CO2 27 06/26/2011 1251   BUN 16.0 01/28/2012 1230   BUN 13 06/26/2011 1251   CREATININE 0.9 01/28/2012 1230   CREATININE 0.87 06/26/2011 1251      Component Value Date/Time   CALCIUM 9.5 01/28/2012 1230   CALCIUM 9.4 06/26/2011 1251   ALKPHOS 74 01/28/2012 1230   ALKPHOS 72 06/26/2011 1251   AST 14 01/28/2012 1230   AST 17 06/26/2011 1251   ALT 12 01/28/2012 1230   ALT 13 06/26/2011 1251   BILITOT 0.40 01/28/2012 1230   BILITOT 0.3 06/26/2011 1251      .pathology. Radiological Studies: chest X-ray n/a Mammogram n/a Bone density 7/12- wnl  Impression and Plan: Ms Narang is doing well, she is tolerating femara, I will see her in 6 months time. I've referred for sheet study. She has no evidence of recurrence. More than 50% of  the visit was spent in patient-related counselling   Pierce Crane, MD 10/22/20139:25 AM

## 2012-02-20 ENCOUNTER — Other Ambulatory Visit: Payer: Self-pay | Admitting: Oncology

## 2012-06-17 ENCOUNTER — Telehealth: Payer: Self-pay | Admitting: *Deleted

## 2012-06-17 NOTE — Telephone Encounter (Signed)
Left message for a return phone call to reschedule her appt.  Awaiting patient response I have cancelled her appts.  

## 2012-06-22 ENCOUNTER — Encounter: Payer: Self-pay | Admitting: *Deleted

## 2012-06-22 ENCOUNTER — Encounter: Payer: Self-pay | Admitting: Oncology

## 2012-06-22 NOTE — Progress Notes (Signed)
Mailed letter & calendar to pt. 

## 2012-08-06 ENCOUNTER — Other Ambulatory Visit: Payer: PRIVATE HEALTH INSURANCE | Admitting: Lab

## 2012-08-06 ENCOUNTER — Ambulatory Visit (HOSPITAL_BASED_OUTPATIENT_CLINIC_OR_DEPARTMENT_OTHER): Payer: PRIVATE HEALTH INSURANCE | Admitting: Lab

## 2012-08-06 ENCOUNTER — Ambulatory Visit (HOSPITAL_BASED_OUTPATIENT_CLINIC_OR_DEPARTMENT_OTHER): Payer: 59 | Admitting: Family

## 2012-08-06 ENCOUNTER — Encounter: Payer: Self-pay | Admitting: Family

## 2012-08-06 ENCOUNTER — Telehealth: Payer: Self-pay | Admitting: *Deleted

## 2012-08-06 VITALS — BP 135/87 | HR 87 | Temp 98.6°F | Resp 20 | Ht 64.0 in | Wt 122.9 lb

## 2012-08-06 DIAGNOSIS — Z853 Personal history of malignant neoplasm of breast: Secondary | ICD-10-CM

## 2012-08-06 DIAGNOSIS — C50519 Malignant neoplasm of lower-outer quadrant of unspecified female breast: Secondary | ICD-10-CM

## 2012-08-06 DIAGNOSIS — Z170 Estrogen receptor positive status [ER+]: Secondary | ICD-10-CM

## 2012-08-06 DIAGNOSIS — M899 Disorder of bone, unspecified: Secondary | ICD-10-CM

## 2012-08-06 DIAGNOSIS — C50912 Malignant neoplasm of unspecified site of left female breast: Secondary | ICD-10-CM

## 2012-08-06 DIAGNOSIS — G4700 Insomnia, unspecified: Secondary | ICD-10-CM

## 2012-08-06 DIAGNOSIS — C50319 Malignant neoplasm of lower-inner quadrant of unspecified female breast: Secondary | ICD-10-CM

## 2012-08-06 DIAGNOSIS — M949 Disorder of cartilage, unspecified: Secondary | ICD-10-CM

## 2012-08-06 DIAGNOSIS — C50512 Malignant neoplasm of lower-outer quadrant of left female breast: Secondary | ICD-10-CM | POA: Insufficient documentation

## 2012-08-06 LAB — CBC WITH DIFFERENTIAL/PLATELET
BASO%: 0.5 % (ref 0.0–2.0)
EOS%: 1.6 % (ref 0.0–7.0)
HCT: 37.2 % (ref 34.8–46.6)
LYMPH%: 36.3 % (ref 14.0–49.7)
MCH: 31.4 pg (ref 25.1–34.0)
MCHC: 34.4 g/dL (ref 31.5–36.0)
MONO#: 0.5 10*3/uL (ref 0.1–0.9)
NEUT%: 52.8 % (ref 38.4–76.8)
Platelets: 315 10*3/uL (ref 145–400)

## 2012-08-06 LAB — COMPREHENSIVE METABOLIC PANEL (CC13)
ALT: 13 U/L (ref 0–55)
Albumin: 3.9 g/dL (ref 3.5–5.0)
Alkaline Phosphatase: 95 U/L (ref 40–150)
CO2: 27 mEq/L (ref 22–29)
Glucose: 100 mg/dl — ABNORMAL HIGH (ref 70–99)
Potassium: 3.9 mEq/L (ref 3.5–5.1)
Sodium: 141 mEq/L (ref 136–145)
Total Bilirubin: 0.27 mg/dL (ref 0.20–1.20)
Total Protein: 7 g/dL (ref 6.4–8.3)

## 2012-08-06 MED ORDER — LETROZOLE 2.5 MG PO TABS: 2.5000 mg | ORAL_TABLET | Freq: Every day | ORAL | Status: DC

## 2012-08-06 NOTE — Patient Instructions (Addendum)
Please contact us at [phone] if you have any questions or concerns. 

## 2012-08-06 NOTE — Telephone Encounter (Signed)
appts made and printed...td 

## 2012-08-06 NOTE — Progress Notes (Signed)
[facility]  Telephone:([phone]) [phone] Fax:([phone]) [phone]  OFFICE PROGRESS NOTE  PATIENT: Elizabeth [name]   DOB: 02-28-1956  MR#: [id number]  ZHY#:[id number]   EX:BMWUXL,KGMWNU, PA-C Claud Kelp, MD   DIAGNOSIS: A 57 year old Elizabeth [name] woman with a history of invasive ductal carcinoma of the left breast diagnosed in 11/2008.   PRIOR THERAPY: 1.  On 12/13/2008 the patient had a left breast needle core biopsy at the 5:30 position which showed invasive ductal carcinoma, ER 99%, PR 41%, Ki-67 11%, HER-2/neu negative by CISH with a ratio of 1.00.  Oncotype DX revealed a Oncotype recurrence score of 23 with average rate of distance recurrence at 15%.  2.  The patient underwent a bilateral breast MRI on 12/21/2008 which showed:  "Minimal background parenchymal nodular enhancement in both breasts. 2.7 x 2.0 x 1.7 cm enhancing mass with irregular margins in the lower outer left breast in the 5 o'clock position. This contains a biopsy marker clip artifact superiorly and corresponds to the recently biopsied invasive ductal carcinoma. No other masses or areas of abnormal enhancement suspicious for malignancy in either breast. No abnormal appearing lymph nodes."  3.  On 01/11/2009 the patient had echocardiogram which showed an ejection fraction of 55-60%.  4.  Status post neoadjuvant chemotherapy with FEC from 01/27/2009 through 03/10/2009 x 4 cycles.  5.  The patient was a participant in the "bed sheets" study.  6.  Status post bilateral mastectomy with sentinel node biopsy with small fibroadenoma on 05/22/2009 for a stage IIB, pT2 pN0 (i-), invasive ductal carcinoma the left breast, grade 1, 2.5 cm, 8 or 99%, PR 41%, Ki-67 11%, HER-2/neu negative by CISH.  7.  The patient underwent breast reconstruction surgery in 08/201.  8.  The patient has been on antiestrogen therapy with Femara since 04/2009.  CURRENT THERAPY: Femara 2.5 mg by mouth daily.   INTERVAL  HISTORY: Dr. Welton Flakes and I saw Ms. Elizabeth [name].  Ponciano today for follow up of invasive ductal carcinoma of the left breast status post bilateral mastectomy with breast reconstruction.  She was last seen by Dr. Donnie Coffin on 02/04/2012.  Since her last office visit the patient has ongoing complaints of hot flashes, body aches and insomnia.  The patient has RSD (reflex sympathetic dystrophy - a nerve disorder).  The patient denies any other symptomatology.  Elizabeth [name] is establishing herself with Dr. Milta Deiters service today.  PAST MEDICAL HISTORY: Past Medical History  Diagnosis Date  . Breast cancer 2010    Left  . Hypercholesteremia   . RSD (reflex sympathetic dystrophy)   . Chronic sinusitis   . Migraines   . Hypothyroid     PAST SURGICAL HISTORY: Past Surgical History  Procedure Laterality Date  . Mastectomy Bilateral 05/22/2009  . Breast reconstruction    . Nasal sinus surgery    . Shoulder surgery Right   . Knee surgery Right     Two surgeries  . Knee arthroscopy Bilateral     FAMILY HISTORY: Family History  Problem Relation Age of Onset  . Hypertension Mother     SOCIAL HISTORY: History  Substance Use Topics  . Smoking status: Never Smoker   . Smokeless tobacco: Never Used  . Alcohol Use: No    ALLERGIES: Allergies  Allergen Reactions  . Shellfish Allergy Anaphylaxis    Allergist said she was "highly allergic to shellfish"  . Sulfa Antibiotics Rash    Looks like severe sunburn     MEDICATIONS:  Current Outpatient Prescriptions  Medication Sig Dispense Refill  . atorvastatin (LIPITOR) 20 MG tablet Take 20 mg by mouth daily.      Marland Kitchen BIOTIN PO Take 1,000 mcg by mouth daily.       . fish oil-omega-3 fatty acids 1000 MG capsule Take 1 g by mouth daily.       Marland Kitchen letrozole (FEMARA) 2.5 MG tablet Take 1 tablet (2.5 mg total) by mouth daily.  90 tablet  8  . levothyroxine (SYNTHROID, LEVOTHROID) 25 MCG tablet Take 25 mcg by mouth daily.      . Multiple Vitamin (MULTIVITAMIN) tablet  Take 1 tablet by mouth daily.      . Red Yeast Rice 600 MG CAPS Take 1,200 mg by mouth 2 (two) times daily.       No current facility-administered medications for this visit.      REVIEW OF SYSTEMS: A 10 point review of systems was completed and is negative except as noted above.    PHYSICAL EXAMINATION: BP 135/87  Pulse 87  Temp(Src) 98.6 F (37 C) (Oral)  Resp 20  Ht 5\' 4"  (1.626 m)  Wt 122 lb 14.4 oz (55.747 kg)  BMI 21.09 kg/m2   General appearance: Alert, cooperative, well nourished, thin frame, no apparent distress Head: Normocephalic, without obvious abnormality, atraumatic Eyes: Conjunctivae/corneas clear, PERRLA, EOMI Nose: Nares, septum and mucosa are normal, no drainage or sinus tenderness Neck: No adenopathy, supple, symmetrical, trachea midline, thyroid not enlarged, no tenderness Resp: Clear to auscultation bilaterally Cardio: Regular rate and rhythm, S1, S2 normal, no murmur, click, rub or gallop Breasts: Bilateral mastectomy with reconstruction, bilateral well-healed surgical scars, no lymphadenopathy, no axilla fullness GI: Soft, not distended, non-tender, hypoactive bowel sounds, no organomegaly Extremities: Extremities normal, atraumatic, no cyanosis or edema Lymph nodes: Cervical, supraclavicular, and axillary nodes normal Neurologic: Grossly normal    ECOG FS:  Grade 1 - Symptomatic but completely ambulatory   LAB RESULTS: Lab Results  Component Value Date   WBC 5.3 08/06/2012   NEUTROABS 2.8 08/06/2012   HGB 12.8 08/06/2012   HCT 37.2 08/06/2012   MCV 91.2 08/06/2012   PLT 315 08/06/2012      Chemistry      Component Value Date/Time   NA 141 08/06/2012 1313   NA 140 06/26/2011 1251   K 3.9 08/06/2012 1313   K 4.5 06/26/2011 1251   CL 105 08/06/2012 1313   CL 103 06/26/2011 1251   CO2 27 08/06/2012 1313   CO2 27 06/26/2011 1251   BUN 16.9 08/06/2012 1313   BUN 13 06/26/2011 1251   CREATININE 0.8 08/06/2012 1313   CREATININE 0.87 06/26/2011 1251       Component Value Date/Time   CALCIUM 9.3 08/06/2012 1313   CALCIUM 9.4 06/26/2011 1251   ALKPHOS 95 08/06/2012 1313   ALKPHOS 72 06/26/2011 1251   AST 18 08/06/2012 1313   AST 17 06/26/2011 1251   ALT 13 08/06/2012 1313   ALT 13 06/26/2011 1251   BILITOT 0.27 08/06/2012 1313   BILITOT 0.3 06/26/2011 1251       Lab Results  Component Value Date   LABCA2 22 01/28/2012     RADIOGRAPHIC STUDIES: No results found.  ASSESSMENT: 57 y.o. [place], [place] woman:  1.  On 12/13/2008 the patient had a left breast needle core biopsy at the 5:30 position which showed invasive ductal carcinoma, ER 99%, PR 41%, Ki-67 11%, HER-2/neu negative by CISH with a ratio of 1.00.  Oncotype DX revealed a Oncotype recurrence  score of 23 with average rate of distance recurrence at 15%.  The patient underwent a bilateral breast MRI on 12/21/2008 which showed:  "Minimal background parenchymal nodular enhancement in both breasts. 2.7 x 2.0 x 1.7 cm enhancing mass with irregular margins in the lower outer left breast in the 5 o'clock position. No abnormal appearing lymph nodes."  On 01/11/2009 the patient had echocardiogram which showed an ejection fraction of 55-60%.  Status post neoadjuvant chemotherapy with FEC from 01/27/2009 through 03/10/2009 x 4 cycles.  The patient was a participant in the "bed sheets" study.  Status post bilateral mastectomy with sentinel node biopsy with small fibroadenoma on 05/22/2009 for a stage IIB, pT2 pN0 (i-), invasive ductal carcinoma the left breast, grade 1, 2.5 cm, 8 or 99%, PR 41%, Ki-67 11%, HER-2/neu negative by CISH.  The patient underwent breast reconstruction surgery in 08/201.  The patient has been on antiestrogen therapy with Femara since 04/2009.  2.  Hot flashes/ body aches/ insomnia  3.  Osteopenia  PLAN: 1.  The patient will continue antiestrogen therapy with Femara 2.5 mg by mouth daily.  Electronic prescription for Femara #90 with 8 refills was sent to the patient's  pharmacy.  2.  Dr. Welton Flakes discussed prescribing Cymbalta to the patient as a possible remedy for the patient's hot flashes/ body aches/insomnia.  The patient declined literature and a prescription for this medication, but will complete her own research and give this medication further consideration.  3.  The patient's last bone density examination on 10/22/2010 showed a T score of -1.6 (osteopenia).  The patient was asked to take vitamin D 1000 IUs by mouth daily in addition to calcium 600 mg by mouth daily for osteopenia.  We will schedule a bone density scan for her in 10/2012.  4.  We plan to see the patient again in 6 months at which time we will check a CBC, CMP, LDH, and vitamin D level.  All questions were answered.  The patient was encouraged to contact us in the interim with any problems, questions or concerns.    Larina Bras, NP-C 08/08/2012, 6:49 PM

## 2012-08-07 LAB — VITAMIN D 25 HYDROXY (VIT D DEFICIENCY, FRACTURES): Vit D, 25-Hydroxy: 47 ng/mL (ref 30–89)

## 2012-08-11 ENCOUNTER — Telehealth: Payer: Self-pay | Admitting: Medical Oncology

## 2012-08-11 NOTE — Telephone Encounter (Signed)
Per Larina Bras, NP informed patient that her labs are great [facility]). Patient to call office with any questions or concerns.

## 2012-08-13 ENCOUNTER — Ambulatory Visit: Payer: PRIVATE HEALTH INSURANCE | Admitting: Oncology

## 2012-10-22 ENCOUNTER — Ambulatory Visit
Admission: RE | Admit: 2012-10-22 | Discharge: 2012-10-22 | Disposition: A | Discharge status: Home / Self Care | Payer: PRIVATE HEALTH INSURANCE | Source: Ambulatory Visit | Attending: Family | Admitting: Family

## 2012-10-22 DIAGNOSIS — Z853 Personal history of malignant neoplasm of breast: Secondary | ICD-10-CM

## 2012-11-05 ENCOUNTER — Telehealth: Payer: Self-pay | Admitting: Medical Oncology

## 2012-11-05 NOTE — Telephone Encounter (Signed)
Patient called inquiring whether she was BRCA positive or negative. Informed patient that there is nothing in medical records to show that this test has been done. Patient expressed verbal understanding, no further questions at this time.

## 2012-11-13 ENCOUNTER — Telehealth: Payer: Self-pay | Admitting: Family

## 2012-11-13 NOTE — Telephone Encounter (Signed)
Wanted to discuss bone density scan results with the patient.  Her bone density scan showed osteopenia with a T score of -1.5.  I would like the patient to take calcium 600 mg daily (i.e. Caltrate with D., calcium gummies, Viactiv chews) and vitamin D3 1000 IUs by mouth daily for osteopenia.  LM to call back

## 2012-11-17 ENCOUNTER — Telehealth: Payer: Self-pay | Admitting: Family

## 2012-11-17 NOTE — Telephone Encounter (Signed)
Explained bone density exam results (mild osteopenia - Tscore -1.5) - Patient is already taking vitamin D3 1000 IUs daily.  Will increase to 2000 IUs daily + add Caltrate with D once daily.  Elizabeth [name] voiced understanding.

## 2013-02-04 ENCOUNTER — Other Ambulatory Visit (HOSPITAL_BASED_OUTPATIENT_CLINIC_OR_DEPARTMENT_OTHER): Payer: PRIVATE HEALTH INSURANCE | Admitting: Lab

## 2013-02-04 ENCOUNTER — Telehealth: Payer: Self-pay | Admitting: Oncology

## 2013-02-04 ENCOUNTER — Ambulatory Visit (HOSPITAL_BASED_OUTPATIENT_CLINIC_OR_DEPARTMENT_OTHER): Payer: PRIVATE HEALTH INSURANCE | Admitting: Oncology

## 2013-02-04 ENCOUNTER — Encounter: Payer: Self-pay | Admitting: Oncology

## 2013-02-04 ENCOUNTER — Encounter (INDEPENDENT_AMBULATORY_CARE_PROVIDER_SITE_OTHER): Payer: Self-pay

## 2013-02-04 VITALS — BP 131/78 | HR 99 | Temp 98.2°F | Resp 20 | Ht 64.0 in | Wt 122.7 lb

## 2013-02-04 DIAGNOSIS — C50919 Malignant neoplasm of unspecified site of unspecified female breast: Secondary | ICD-10-CM

## 2013-02-04 DIAGNOSIS — R232 Flushing: Secondary | ICD-10-CM

## 2013-02-04 DIAGNOSIS — C50519 Malignant neoplasm of lower-outer quadrant of unspecified female breast: Secondary | ICD-10-CM

## 2013-02-04 DIAGNOSIS — M899 Disorder of bone, unspecified: Secondary | ICD-10-CM

## 2013-02-04 DIAGNOSIS — Z853 Personal history of malignant neoplasm of breast: Secondary | ICD-10-CM

## 2013-02-04 DIAGNOSIS — G4700 Insomnia, unspecified: Secondary | ICD-10-CM

## 2013-02-04 DIAGNOSIS — R52 Pain, unspecified: Secondary | ICD-10-CM

## 2013-02-04 DIAGNOSIS — C50912 Malignant neoplasm of unspecified site of left female breast: Secondary | ICD-10-CM

## 2013-02-04 LAB — CBC WITH DIFFERENTIAL/PLATELET
BASO%: 0.9 % (ref 0.0–2.0)
EOS%: 3.7 % (ref 0.0–7.0)
HCT: 36.7 % (ref 34.8–46.6)
LYMPH%: 27.7 % (ref 14.0–49.7)
MCH: 31.7 pg (ref 25.1–34.0)
MCHC: 34.1 g/dL (ref 31.5–36.0)
MONO%: 10.5 % (ref 0.0–14.0)
NEUT%: 57.2 % (ref 38.4–76.8)
Platelets: 323 10*3/uL (ref 145–400)
RBC: 3.94 10*6/uL (ref 3.70–5.45)
WBC: 4.5 10*3/uL (ref 3.9–10.3)

## 2013-02-04 LAB — COMPREHENSIVE METABOLIC PANEL (CC13)
ALT: 12 U/L (ref 0–55)
AST: 18 U/L (ref 5–34)
Alkaline Phosphatase: 81 U/L (ref 40–150)
BUN: 12.6 mg/dL (ref 7.0–26.0)
Calcium: 9.4 mg/dL (ref 8.4–10.4)
Chloride: 109 mEq/L (ref 98–109)
Creatinine: 0.8 mg/dL (ref 0.6–1.1)
Glucose: 81 mg/dl (ref 70–140)
Total Protein: 6.8 g/dL (ref 6.4–8.3)

## 2013-02-04 NOTE — Telephone Encounter (Signed)
, °

## 2013-02-04 NOTE — Progress Notes (Signed)
[facility]  Telephone:(336) [phone] [phone]  OFFICE PROGRESS NOTE  PATIENT: Elizabeth [name]   DOB: 05/09/1955  MR#: [id number]  JYN#:[id number]   QM:VHQION,GEXBMW, PA-C Claud Kelp, MD   DIAGNOSIS: A 57 year old [place], [place] woman with a history of invasive ductal carcinoma of the left breast diagnosed in 11/2008.   PRIOR THERAPY: 1.  On 12/13/2008 the patient had a left breast needle core biopsy at the 5:30 position which showed invasive ductal carcinoma, ER 99%, PR 41%, Ki-67 11%, HER-2/neu negative by CISH with a ratio of 1.00.  Oncotype DX revealed a Oncotype recurrence score of 23 with average rate of distance recurrence at 15%.  2.  The patient underwent a bilateral breast MRI on 12/21/2008 which showed:  "Minimal background parenchymal nodular enhancement in both breasts. 2.7 x 2.0 x 1.7 cm enhancing mass with irregular margins in the lower outer left breast in the 5 o'clock position. This contains a biopsy marker clip artifact superiorly and corresponds to the recently biopsied invasive ductal carcinoma. No other masses or areas of abnormal enhancement suspicious for malignancy in either breast. No abnormal appearing lymph nodes."  3.  On 01/11/2009 the patient had echocardiogram which showed an ejection fraction of 55-60%.  4.  Status post neoadjuvant chemotherapy with FEC from 01/27/2009 through 03/10/2009 x 4 cycles.  5.  The patient was a participant in the "bed sheets" study.  6.  Status post bilateral mastectomy with sentinel node biopsy with small fibroadenoma on 05/22/2009 for a stage IIB, pT2 pN0 (i-), invasive ductal carcinoma the left breast, grade 1, 2.5 cm, 8 or 99%, PR 41%, Ki-67 11%, HER-2/neu negative by CISH.  7.  The patient underwent breast reconstruction surgery in 11/2009.  8.  The patient has been on antiestrogen therapy with Femara since 04/2009.  CURRENT THERAPY: Femara 2.5 mg by mouth daily.   INTERVAL HISTORY:  Ms. Nasia Cannan.  Holloran today for follow up of invasive ductal carcinoma of the left breast status post bilateral mastectomy with breast reconstruction.Since her last office visit the patient has ongoing complaints of hot flashes, body aches and insomnia.  The patient has RSD (reflex sympathetic dystrophy - a nerve disorder).  The patient denies any other symptomatology.    PAST MEDICAL HISTORY: Past Medical History  Diagnosis Date  . Breast cancer 2010    Left  . Hypercholesteremia   . RSD (reflex sympathetic dystrophy)   . Chronic sinusitis   . Migraines   . Hypothyroid     PAST SURGICAL HISTORY: Past Surgical History  Procedure Laterality Date  . Mastectomy Bilateral 05/22/2009  . Breast reconstruction    . Nasal sinus surgery    . Shoulder surgery Right   . Knee surgery Right     Two surgeries  . Knee arthroscopy Bilateral     FAMILY HISTORY: Family History  Problem Relation Age of Onset  . Hypertension Mother     SOCIAL HISTORY: History  Substance Use Topics  . Smoking status: Never Smoker   . Smokeless tobacco: Never Used  . Alcohol Use: No    ALLERGIES: Allergies  Allergen Reactions  . Shellfish Allergy Anaphylaxis    Allergist said she was "highly allergic to shellfish"  . Sulfa Antibiotics Rash    Looks like severe sunburn     MEDICATIONS:  Current Outpatient Prescriptions  Medication Sig Dispense Refill  . atorvastatin (LIPITOR) 20 MG tablet Take 20 mg by mouth daily.      Marland Kitchen BIOTIN PO  Take 1,000 mcg by mouth daily.       . Calcium Carbonate-Vit D-Min (CALTRATE 600+D PLUS PO) Take by mouth.      . cholecalciferol (VITAMIN D) 1000 UNITS tablet Take 1,000 Units by mouth daily.      . fish oil-omega-3 fatty acids 1000 MG capsule Take 1 g by mouth daily.       Marland Kitchen letrozole (FEMARA) 2.5 MG tablet Take 1 tablet (2.5 mg total) by mouth daily.  90 tablet  8  . levothyroxine (SYNTHROID, LEVOTHROID) 25 MCG tablet Take 25 mcg by mouth daily.      . Multiple Vitamin  (MULTIVITAMIN) tablet Take 1 tablet by mouth daily.      . Red Yeast Rice 600 MG CAPS Take 1,200 mg by mouth 2 (two) times daily.       No current facility-administered medications for this visit.      REVIEW OF SYSTEMS: A 10 point review of systems was completed and is negative except as noted above.    PHYSICAL EXAMINATION: BP 131/78  Pulse 99  Temp(Src) 98.2 F (36.8 C) (Oral)  Resp 20  Ht 5\' 4"  (1.626 m)  Wt 122 lb 11.2 oz (55.656 kg)  BMI 21.05 kg/m2   General appearance: Alert, cooperative, well nourished, thin frame, no apparent distress Head: Normocephalic, without obvious abnormality, atraumatic Eyes: Conjunctivae/corneas clear, PERRLA, EOMI Nose: Nares, septum and mucosa are normal, no drainage or sinus tenderness Neck: No adenopathy, supple, symmetrical, trachea midline, thyroid not enlarged, no tenderness Resp: Clear to auscultation bilaterally Cardio: Regular rate and rhythm, S1, S2 normal, no murmur, click, rub or gallop Breasts: Bilateral mastectomy with reconstruction, bilateral well-healed surgical scars, no lymphadenopathy, no axilla fullness GI: Soft, not distended, non-tender, hypoactive bowel sounds, no organomegaly Extremities: Extremities normal, atraumatic, no cyanosis or edema Lymph nodes: Cervical, supraclavicular, and axillary nodes normal Neurologic: Grossly normal    ECOG FS:  Grade 1 - Symptomatic but completely ambulatory   LAB RESULTS: Lab Results  Component Value Date   WBC 4.5 02/04/2013   NEUTROABS 2.6 02/04/2013   HGB 12.5 02/04/2013   HCT 36.7 02/04/2013   MCV 93.0 02/04/2013   PLT 323 02/04/2013      Chemistry      Component Value Date/Time   NA 141 08/06/2012 1313   NA 140 06/26/2011 1251   K 3.9 08/06/2012 1313   K 4.5 06/26/2011 1251   CL 105 08/06/2012 1313   CL 103 06/26/2011 1251   CO2 27 08/06/2012 1313   CO2 27 06/26/2011 1251   BUN 16.9 08/06/2012 1313   BUN 13 06/26/2011 1251   CREATININE 0.8 08/06/2012 1313    CREATININE 0.87 06/26/2011 1251      Component Value Date/Time   CALCIUM 9.3 08/06/2012 1313   CALCIUM 9.4 06/26/2011 1251   ALKPHOS 95 08/06/2012 1313   ALKPHOS 72 06/26/2011 1251   AST 18 08/06/2012 1313   AST 17 06/26/2011 1251   ALT 13 08/06/2012 1313   ALT 13 06/26/2011 1251   BILITOT 0.27 08/06/2012 1313   BILITOT 0.3 06/26/2011 1251       Lab Results  Component Value Date   LABCA2 22 01/28/2012     RADIOGRAPHIC STUDIES: No results found.  ASSESSMENT: 57 y.o. [place], [place] woman:  1.  On 12/13/2008 the patient had a left breast needle core biopsy at the 5:30 position which showed invasive ductal carcinoma, ER 99%, PR 41%, Ki-67 11%, HER-2/neu negative by CISH with a ratio  of 1.00.  Oncotype DX revealed a Oncotype recurrence score of 23 with average rate of distance recurrence at 15%.  The patient underwent a bilateral breast MRI on 12/21/2008 which showed:  "Minimal background parenchymal nodular enhancement in both breasts. 2.7 x 2.0 x 1.7 cm enhancing mass with irregular margins in the lower outer left breast in the 5 o'clock position. No abnormal appearing lymph nodes."  On 01/11/2009 the patient had echocardiogram which showed an ejection fraction of 55-60%.  Status post neoadjuvant chemotherapy with FEC from 01/27/2009 through 03/10/2009 x 4 cycles.  The patient was a participant in the "bed sheets" study.  Status post bilateral mastectomy with sentinel node biopsy with small fibroadenoma on 05/22/2009 for a stage IIB, pT2 pN0 (i-), invasive ductal carcinoma the left breast, grade 1, 2.5 cm, 8 or 99%, PR 41%, Ki-67 11%, HER-2/neu negative by CISH.  The patient underwent breast reconstruction surgery in 08/201.  The patient has been on antiestrogen therapy with Femara since 04/2009.  2.  Hot flashes/ body aches/ insomnia  3.  Osteopenia improved  PLAN: 1.  The patient will continue antiestrogen therapy with Femara 2.5 mg by mouth daily.   2. Osteopenia on calcium and  vitamin D. Repeat bone density looks normal  3. We will see her back in 6 months    All questions were answered.  The patient was encouraged to contact us in the interim with any problems, questions or concerns. The length of time of the face-to-face encounter was 25    minutes. More than 50% of time was spent counseling and coordination of care.   Drue Second, MD Medical/Oncology [facility] [phone] (beeper) [phone] (Office)  02/04/2013, 9:24 AM

## 2013-02-04 NOTE — Patient Instructions (Signed)
Continue femara daily  Continue exercise and being active, eating healthy  We will see you back in 6 months

## 2013-02-05 LAB — VITAMIN D 25 HYDROXY (VIT D DEFICIENCY, FRACTURES): Vit D, 25-Hydroxy: 62 ng/mL (ref 30–89)

## 2013-08-17 ENCOUNTER — Telehealth: Payer: Self-pay | Admitting: Oncology

## 2013-08-17 ENCOUNTER — Other Ambulatory Visit: Payer: Self-pay | Admitting: *Deleted

## 2013-08-17 DIAGNOSIS — Z853 Personal history of malignant neoplasm of breast: Secondary | ICD-10-CM

## 2013-08-17 DIAGNOSIS — C50912 Malignant neoplasm of unspecified site of left female breast: Secondary | ICD-10-CM

## 2013-08-17 MED ORDER — LETROZOLE 2.5 MG PO TABS: 2.5000 mg | ORAL_TABLET | Freq: Every day | ORAL | Status: DC

## 2013-08-17 NOTE — Telephone Encounter (Signed)
, °

## 2013-08-26 ENCOUNTER — Ambulatory Visit: Payer: PRIVATE HEALTH INSURANCE | Admitting: Oncology

## 2013-08-26 ENCOUNTER — Other Ambulatory Visit: Payer: PRIVATE HEALTH INSURANCE

## 2013-09-23 ENCOUNTER — Other Ambulatory Visit: Payer: Self-pay | Admitting: Hematology and Oncology

## 2013-09-23 DIAGNOSIS — C50919 Malignant neoplasm of unspecified site of unspecified female breast: Secondary | ICD-10-CM

## 2013-09-24 ENCOUNTER — Ambulatory Visit (HOSPITAL_BASED_OUTPATIENT_CLINIC_OR_DEPARTMENT_OTHER): Payer: PRIVATE HEALTH INSURANCE | Admitting: Hematology and Oncology

## 2013-09-24 ENCOUNTER — Telehealth: Payer: Self-pay | Admitting: Hematology and Oncology

## 2013-09-24 ENCOUNTER — Other Ambulatory Visit (HOSPITAL_BASED_OUTPATIENT_CLINIC_OR_DEPARTMENT_OTHER): Payer: PRIVATE HEALTH INSURANCE

## 2013-09-24 VITALS — BP 132/86 | HR 97 | Temp 98.3°F | Resp 18 | Ht 64.0 in | Wt 123.6 lb

## 2013-09-24 DIAGNOSIS — IMO0001 Reserved for inherently not codable concepts without codable children: Secondary | ICD-10-CM

## 2013-09-24 DIAGNOSIS — C50519 Malignant neoplasm of lower-outer quadrant of unspecified female breast: Secondary | ICD-10-CM

## 2013-09-24 DIAGNOSIS — Z170 Estrogen receptor positive status [ER+]: Secondary | ICD-10-CM

## 2013-09-24 DIAGNOSIS — C50919 Malignant neoplasm of unspecified site of unspecified female breast: Secondary | ICD-10-CM

## 2013-09-24 DIAGNOSIS — M949 Disorder of cartilage, unspecified: Secondary | ICD-10-CM

## 2013-09-24 DIAGNOSIS — N951 Menopausal and female climacteric states: Secondary | ICD-10-CM

## 2013-09-24 DIAGNOSIS — M899 Disorder of bone, unspecified: Secondary | ICD-10-CM

## 2013-09-24 LAB — COMPREHENSIVE METABOLIC PANEL (CC13)
ALT: 12 U/L (ref 0–55)
ANION GAP: 8 meq/L (ref 3–11)
AST: 17 U/L (ref 5–34)
Albumin: 3.9 g/dL (ref 3.5–5.0)
Alkaline Phosphatase: 86 U/L (ref 40–150)
BUN: 15 mg/dL (ref 7.0–26.0)
CALCIUM: 9.1 mg/dL (ref 8.4–10.4)
CHLORIDE: 108 meq/L (ref 98–109)
CO2: 28 mEq/L (ref 22–29)
Creatinine: 0.9 mg/dL (ref 0.6–1.1)
Glucose: 101 mg/dl (ref 70–140)
Potassium: 4.1 mEq/L (ref 3.5–5.1)
SODIUM: 145 meq/L (ref 136–145)
TOTAL PROTEIN: 6.6 g/dL (ref 6.4–8.3)
Total Bilirubin: 0.26 mg/dL (ref 0.20–1.20)

## 2013-09-24 LAB — CBC WITH DIFFERENTIAL/PLATELET
BASO%: 0.7 % (ref 0.0–2.0)
Basophils Absolute: 0 10*3/uL (ref 0.0–0.1)
EOS%: 1.9 % (ref 0.0–7.0)
Eosinophils Absolute: 0.1 10*3/uL (ref 0.0–0.5)
HCT: 37.3 % (ref 34.8–46.6)
HGB: 12.6 g/dL (ref 11.6–15.9)
LYMPH#: 2 10*3/uL (ref 0.9–3.3)
LYMPH%: 42.9 % (ref 14.0–49.7)
MCH: 30.8 pg (ref 25.1–34.0)
MCHC: 33.7 g/dL (ref 31.5–36.0)
MCV: 91.3 fL (ref 79.5–101.0)
MONO#: 0.5 10*3/uL (ref 0.1–0.9)
MONO%: 10.1 % (ref 0.0–14.0)
NEUT#: 2 10*3/uL (ref 1.5–6.5)
NEUT%: 44.4 % (ref 38.4–76.8)
Platelets: 332 10*3/uL (ref 145–400)
RBC: 4.08 10*6/uL (ref 3.70–5.45)
RDW: 13.3 % (ref 11.2–14.5)
WBC: 4.6 10*3/uL (ref 3.9–10.3)

## 2013-09-24 MED ORDER — VENLAFAXINE HCL ER 37.5 MG PO CP24: 37.5000 mg | ORAL_CAPSULE | Freq: Every day | ORAL | Status: DC

## 2013-09-24 NOTE — Progress Notes (Signed)
Elizabeth [name]  Telephone:(336) [phone] Fax:(336) [phone]  OFFICE PROGRESS NOTE  PATIENT: Elizabeth [name]   DOB: 06-16-1955  MR#: [id number]  RFX#:[id number]   YM:EBRAXE,NMMHWK, PA-C Elizabeth Skates, MD  Chief complaint: Follow up visit for breast cancer  DIAGNOSIS:  Invasive ductal carcinoma of the left breast diagnosed in 11/2008.   PRIOR THERAPY: As per previously dictated Dr. Laurelyn Sickle note:  1.  On 12/13/2008 the patient had a left breast needle core biopsy at the 5:30 position which showed invasive ductal carcinoma, ER 99%, PR 41%, Ki-67 11%, HER-2/neu negative by CISH with a ratio of 1.00.  Oncotype DX revealed a Oncotype recurrence score of 23 with average rate of distance recurrence at 15%.  2.  The patient underwent a bilateral breast MRI on 12/21/2008 which showed:  "Minimal background parenchymal nodular enhancement in both breasts. 2.7 x 2.0 x 1.7 cm enhancing mass with irregular margins in the lower outer left breast in the 5 o'clock position. This contains a biopsy marker clip artifact superiorly and corresponds to the recently biopsied invasive ductal carcinoma. No other masses or areas of abnormal enhancement suspicious for malignancy in either breast. No abnormal appearing lymph nodes."  3.  On 01/11/2009 the patient had echocardiogram which showed an ejection fraction of 55-60%.  4.  Status post neoadjuvant chemotherapy with FEC from 01/27/2009 through 03/10/2009 x 4 cycles.  5.  The patient was a participant in the "bed sheets" study.  6.  Status post bilateral mastectomy with sentinel node biopsy with small fibroadenoma on 05/22/2009 for a stage IIB, pT2 pN0 (i-), invasive ductal carcinoma the left breast, grade 1, 2.5 cm, 8 or 99%, PR 41%, Ki-67 11%, HER-2/neu negative by CISH.  7.  The patient underwent breast reconstruction surgery in 11/2009.  8.  The patient has been on antiestrogen therapy with Femara since 04/2009.  CURRENT THERAPY: Femara 2.5 mg  by mouth daily.   INTERVAL HISTORY: Elizabeth [name] is a 58 years old [place] female is here for followup visit for her left breast cancer. She's been on having worsening hot flashes and also arthralgias and myalgias. she says for  2 years she had worsening of hot flashes and those got better.  Recently she started noticing worsening of hot flashes. She says she's  not able to sleep properly because of hot flashes. She denies any shortness of breath, chest pain, palpitations, blood in the stool or blood in the urine. Denies any fever headaches or blurred vision.  she gained some weight recently because of not able to exercise because of her arthralgias and hot flashes  PAST MEDICAL HISTORY: Past Medical History  Diagnosis Date  . Breast cancer 2010    Left  . Hypercholesteremia   . RSD (reflex sympathetic dystrophy)   . Chronic sinusitis   . Migraines   . Hypothyroid     PAST SURGICAL HISTORY: Past Surgical History  Procedure Laterality Date  . Mastectomy Bilateral 05/22/2009  . Breast reconstruction    . Nasal sinus surgery    . Shoulder surgery Right   . Knee surgery Right     Two surgeries  . Knee arthroscopy Bilateral     FAMILY HISTORY: Family History  Problem Relation Age of Onset  . Hypertension Mother     SOCIAL HISTORY: History  Substance Use Topics  . Smoking status: Never Smoker   . Smokeless tobacco: Never Used  . Alcohol Use: No    ALLERGIES: Allergies  Allergen Reactions  . Shellfish Allergy Anaphylaxis  Allergist said she was "highly allergic to shellfish"  . Sulfa Antibiotics Rash    Looks like severe sunburn     MEDICATIONS:  Current Outpatient Prescriptions  Medication Sig Dispense Refill  . atorvastatin (LIPITOR) 20 MG tablet Take 20 mg by mouth daily.      Marland Kitchen BIOTIN PO Take 1,000 mcg by mouth daily.       . Calcium Carbonate-Vit D-Min (CALTRATE 600+D PLUS PO) Take by mouth.      . cholecalciferol (VITAMIN D) 1000 UNITS tablet Take 1,000 Units by  mouth daily.      Marland Kitchen letrozole (FEMARA) 2.5 MG tablet Take 1 tablet (2.5 mg total) by mouth daily.  90 tablet  0  . levothyroxine (SYNTHROID, LEVOTHROID) 25 MCG tablet Take 25 mcg by mouth daily.      . Multiple Vitamin (MULTIVITAMIN) tablet Take 1 tablet by mouth daily.      Marland Kitchen venlafaxine XR (EFFEXOR-XR) 37.5 MG 24 hr capsule Take 1 capsule (37.5 mg total) by mouth daily with breakfast.  30 capsule  6   No current facility-administered medications for this visit.      REVIEW OF SYSTEMS: A 60 point review of systems was completed and is negative except as noted above.    PHYSICAL EXAMINATION: BP 132/86  Pulse 97  Temp(Src) 98.3 F (36.8 C) (Oral)  Resp 18  Ht '5\' 4"'  (1.626 m)  Wt 123 lb 9.6 oz (56.065 kg)  BMI 21.21 kg/m2   General appearance: Alert, cooperative, well nourished, thin frame, no apparent distress Head: Normocephalic, without obvious abnormality, atraumatic Eyes: Conjunctivae/corneas clear, PERRLA, EOMI Nose: Nares, septum and mucosa are normal, no drainage or sinus tenderness Neck: No adenopathy, supple, symmetrical, trachea midline, thyroid not enlarged, no tenderness Lymph nodes: Cervical, supraclavicular, and axillary nodes normal Resp: Clear to auscultation bilaterally Cardio: Regular rate and rhythm, S1, S2 normal, no murmur, click, rub or gallop Breasts: Bilateral mastectomy with reconstruction, bilateral well-healed surgical scars, no lymphadenopathy, no axillary lymphadenopathy  GI: Soft, not distended, non-tender, hypoactive bowel sounds, no organomegaly Extremities: Extremities normal, atraumatic, no cyanosis or edema Neurologic: Grossly normal  Skin: Intact, no petechiae or purpura noted  ECOG FS:  Grade 1 - Symptomatic but completely ambulatory   LAB RESULTS: Lab Results  Component Value Date   WBC 4.6 09/24/2013   NEUTROABS 2.0 09/24/2013   HGB 12.6 09/24/2013   HCT 37.3 09/24/2013   MCV 91.3 09/24/2013   PLT 332 09/24/2013      Chemistry       Component Value Date/Time   NA 145 09/24/2013 1359   NA 140 06/26/2011 1251   K 4.1 09/24/2013 1359   K 4.5 06/26/2011 1251   CL 105 08/06/2012 1313   CL 103 06/26/2011 1251   CO2 28 09/24/2013 1359   CO2 27 06/26/2011 1251   BUN 15.0 09/24/2013 1359   BUN 13 06/26/2011 1251   CREATININE 0.9 09/24/2013 1359   CREATININE 0.87 06/26/2011 1251      Component Value Date/Time   CALCIUM 9.1 09/24/2013 1359   CALCIUM 9.4 06/26/2011 1251   ALKPHOS 86 09/24/2013 1359   ALKPHOS 72 06/26/2011 1251   AST 17 09/24/2013 1359   AST 17 06/26/2011 1251   ALT 12 09/24/2013 1359   ALT 13 06/26/2011 1251   BILITOT 0.26 09/24/2013 1359   BILITOT 0.3 06/26/2011 1251       Lab Results  Component Value Date   LABCA2 22 01/28/2012     RADIOGRAPHIC STUDIES:  No results found.  ASSESSMENT/PLAN: 58 y.o. [place], [place] woman:  1.  On 12/13/2008 the patient had a left breast needle core biopsy at the 5:30 position which showed invasive ductal carcinoma, ER 99%, PR 41%, Ki-67 11%, HER-2/neu negative by CISH with a ratio of 1.00.  Oncotype DX revealed a Oncotype recurrence score of 23 with average rate of distance recurrence at 15%.  The patient underwent a bilateral breast MRI on 12/21/2008 which showed:  "Minimal background parenchymal nodular enhancement in both breasts. 2.7 x 2.0 x 1.7 cm enhancing mass with irregular margins in the lower outer left breast in the 5 o'clock position. No abnormal appearing lymph nodes."  On 01/11/2009 the patient had echocardiogram which showed an ejection fraction of 55-60%.  Status post neoadjuvant chemotherapy with FEC from 01/27/2009 through 03/10/2009 x 4 cycles.  The patient was a participant in the "bed sheets" study.  Status post bilateral mastectomy with sentinel node biopsy with small fibroadenoma on 05/22/2009 for a stage IIB, pT2 pN0 (i-), invasive ductal carcinoma the left breast, grade 1, 2.5 cm, 8 or 99%, PR 41%, Ki-67 11%, HER-2/neu negative by CISH.  The patient  underwent breast reconstruction surgery in 11/2009.  The patient has been on antiestrogen therapy with Femara since 04/2009. I have reviewed her CBC differential and CMP in those are acceptable. Continue Femara 2.5 mg once daily as recommended  2.  Hot flashes: We'll initiate Effexor-XR 37.5 mg by mouth once daily. I have discussed the benefits and side effects of Effexor therapy. patient understood the same and she verbally consented to proceed with Effexor.  3.  Osteopenia improved to normal on repeat DEXA scan which was done in July 2014: Continue calcium with vitamin D supplementation.  4. arthralgias and myalgias  secondary to aromatase inhibitor therapy  Next followup visit in 6 months with CBC differential and CMP   All questions were answered.  The patient was encouraged to contact us in the interim with any problems, questions or concerns.  The length of time of the face-to-face encounter was 20  minutes. Total time spent was 30 minutes  Wilmon Arms, MD Medical/Oncology [facility] [phone] (Office)  09/24/2013, 4:22 PM

## 2013-09-24 NOTE — Telephone Encounter (Signed)
per pof to sch pt appt-printed & gave pt copy of sch

## 2013-09-30 ENCOUNTER — Other Ambulatory Visit: Payer: Self-pay | Admitting: Physician Assistant

## 2013-09-30 ENCOUNTER — Other Ambulatory Visit (HOSPITAL_COMMUNITY)
Admission: RE | Admit: 2013-09-30 | Discharge: 2013-09-30 | Disposition: A | Discharge status: Home / Self Care | Payer: PRIVATE HEALTH INSURANCE | Source: Ambulatory Visit | Attending: Family Medicine | Admitting: Family Medicine

## 2013-09-30 DIAGNOSIS — Z124 Encounter for screening for malignant neoplasm of cervix: Secondary | ICD-10-CM | POA: Insufficient documentation

## 2013-10-04 LAB — CYTOLOGY - PAP

## 2014-02-15 ENCOUNTER — Telehealth: Payer: Self-pay

## 2014-02-15 NOTE — Telephone Encounter (Signed)
LMOVM - pt to return call to clinic.   

## 2014-02-16 ENCOUNTER — Telehealth: Payer: Self-pay

## 2014-02-16 NOTE — Telephone Encounter (Signed)
Returned pt call re: femara.  Pt wants to know ramifications of stopping femara after 5 yrs vs 10 due to hot flashes, joint pain.  Effexor did not help with the hot flashed - pt stopped taking.  Recommended pt try claritin with the femara.  Appt made for 11/24 at 815.  Pt to call if symptoms improve.  Pt voiced understanding.  pof sent

## 2014-02-22 ENCOUNTER — Other Ambulatory Visit: Payer: Self-pay | Admitting: Hematology and Oncology

## 2014-02-24 ENCOUNTER — Other Ambulatory Visit: Payer: Self-pay | Admitting: *Deleted

## 2014-02-24 DIAGNOSIS — Z853 Personal history of malignant neoplasm of breast: Secondary | ICD-10-CM

## 2014-02-24 DIAGNOSIS — C50912 Malignant neoplasm of unspecified site of left female breast: Secondary | ICD-10-CM

## 2014-02-24 MED ORDER — LETROZOLE 2.5 MG PO TABS: 2.5000 mg | ORAL_TABLET | Freq: Every day | ORAL | Status: DC

## 2014-03-01 ENCOUNTER — Telehealth: Payer: Self-pay | Admitting: Hematology and Oncology

## 2014-03-08 ENCOUNTER — Ambulatory Visit: Payer: PRIVATE HEALTH INSURANCE | Admitting: Hematology and Oncology

## 2014-03-08 ENCOUNTER — Other Ambulatory Visit: Payer: PRIVATE HEALTH INSURANCE

## 2014-04-06 ENCOUNTER — Other Ambulatory Visit: Payer: Self-pay | Admitting: *Deleted

## 2014-04-06 DIAGNOSIS — C50919 Malignant neoplasm of unspecified site of unspecified female breast: Secondary | ICD-10-CM

## 2014-04-11 ENCOUNTER — Other Ambulatory Visit (HOSPITAL_BASED_OUTPATIENT_CLINIC_OR_DEPARTMENT_OTHER): Payer: PRIVATE HEALTH INSURANCE

## 2014-04-11 ENCOUNTER — Telehealth: Payer: Self-pay | Admitting: Hematology and Oncology

## 2014-04-11 ENCOUNTER — Ambulatory Visit (HOSPITAL_BASED_OUTPATIENT_CLINIC_OR_DEPARTMENT_OTHER): Payer: PRIVATE HEALTH INSURANCE | Admitting: Hematology and Oncology

## 2014-04-11 VITALS — BP 138/81 | HR 88 | Temp 98.6°F | Resp 20 | Ht 64.0 in | Wt 122.6 lb

## 2014-04-11 DIAGNOSIS — C50512 Malignant neoplasm of lower-outer quadrant of left female breast: Secondary | ICD-10-CM

## 2014-04-11 DIAGNOSIS — C50919 Malignant neoplasm of unspecified site of unspecified female breast: Secondary | ICD-10-CM

## 2014-04-11 DIAGNOSIS — M8580 Other specified disorders of bone density and structure, unspecified site: Secondary | ICD-10-CM

## 2014-04-11 DIAGNOSIS — N951 Menopausal and female climacteric states: Secondary | ICD-10-CM

## 2014-04-11 DIAGNOSIS — Z170 Estrogen receptor positive status [ER+]: Secondary | ICD-10-CM

## 2014-04-11 LAB — CBC WITH DIFFERENTIAL/PLATELET
BASO%: 1 % (ref 0.0–2.0)
Basophils Absolute: 0 10*3/uL (ref 0.0–0.1)
EOS%: 3.6 % (ref 0.0–7.0)
Eosinophils Absolute: 0.2 10*3/uL (ref 0.0–0.5)
HCT: 39.5 % (ref 34.8–46.6)
HGB: 13 g/dL (ref 11.6–15.9)
LYMPH%: 40.9 % (ref 14.0–49.7)
MCH: 30.7 pg (ref 25.1–34.0)
MCHC: 32.8 g/dL (ref 31.5–36.0)
MCV: 93.4 fL (ref 79.5–101.0)
MONO#: 0.5 10*3/uL (ref 0.1–0.9)
MONO%: 11 % (ref 0.0–14.0)
NEUT#: 1.9 10*3/uL (ref 1.5–6.5)
NEUT%: 43.5 % (ref 38.4–76.8)
PLATELETS: 359 10*3/uL (ref 145–400)
RBC: 4.23 10*6/uL (ref 3.70–5.45)
RDW: 13.1 % (ref 11.2–14.5)
WBC: 4.3 10*3/uL (ref 3.9–10.3)
lymph#: 1.8 10*3/uL (ref 0.9–3.3)

## 2014-04-11 LAB — COMPREHENSIVE METABOLIC PANEL (CC13)
ALBUMIN: 3.7 g/dL (ref 3.5–5.0)
ALK PHOS: 91 U/L (ref 40–150)
ALT: 16 U/L (ref 0–55)
ANION GAP: 7 meq/L (ref 3–11)
AST: 16 U/L (ref 5–34)
BUN: 13.4 mg/dL (ref 7.0–26.0)
CO2: 28 mEq/L (ref 22–29)
Calcium: 9.1 mg/dL (ref 8.4–10.4)
Chloride: 106 mEq/L (ref 98–109)
Creatinine: 0.8 mg/dL (ref 0.6–1.1)
EGFR: 81 mL/min/{1.73_m2} — AB (ref >90)
Glucose: 82 mg/dl (ref 70–140)
Potassium: 4 mEq/L (ref 3.5–5.1)
Sodium: 142 mEq/L (ref 136–145)
TOTAL PROTEIN: 6.6 g/dL (ref 6.4–8.3)
Total Bilirubin: 0.48 mg/dL (ref 0.20–1.20)

## 2014-04-11 NOTE — Progress Notes (Signed)
Patient Care Team: [name], PA-C as PCP - General (Nurse Practitioner)  DIAGNOSIS: No matching staging information was found for the patient.  SUMMARY OF ONCOLOGIC HISTORY:   Breast cancer of lower-outer quadrant of left female breast   12/13/2008 Initial Diagnosis Left breast biopsy: Invasive ductal carcinoma ER 90% percent, PR 41%, Ki-67 11%, HER-2 negative ratio 1, Oncotype DX score 23, ROR15%   01/27/2009 - 03/10/2009 Neo-Adjuvant Chemotherapy Neoadjuvant FEC 4; participant in the "bed sheets" study.   05/12/2009 -  Anti-estrogen oral therapy Femara 2.5 mg daily   05/22/2009 Surgery Bilateral mastectomy stage IIB T2 N0 M0 IDC left breast grade 1, 2.5 cm, ER 99%, PR 41%, Ki-67 11%, HER-2 negative   12/10/2009 Surgery Breast reconstruction surgery    CHIEF COMPLIANT: Six-month follow-up on aromatase and immunotherapy  INTERVAL HISTORY: Elizabeth [name] is a 58 year old lady with above-mentioned history of left-sided breast cancer treated with bilateral mastectomies followed by reconstruction and on aromatase inhibitor therapy since January 2011. She would complete 5 years of therapy as of January 2016. Over the past several months she was having increasing muscle aches and pains. She finally figured it out and it was attributed to her cholesterol medication. Once the medication was changed to some to Crestor, her symptoms had improved significantly. She would like to stay on aromatase inhibitor past 5 years.  REVIEW OF SYSTEMS:   Constitutional: Denies fevers, chills or abnormal weight loss Eyes: Denies blurriness of vision Ears, nose, mouth, throat, and face: Denies mucositis or sore throat Respiratory: Denies cough, dyspnea or wheezes Cardiovascular: Denies palpitation, chest discomfort or lower extremity swelling Gastrointestinal:  Denies nausea, heartburn or change in bowel habits Skin: Denies abnormal skin rashes Lymphatics: Denies new lymphadenopathy or easy  bruising Neurological:Denies numbness, tingling or new weaknesses Behavioral/Psych: Mood is stable, no new changes  Breast:  denies any pain or lumps or nodules in either breasts or axilla All other systems were reviewed with the patient and are negative.  I have reviewed the past medical history, past surgical history, social history and family history with the patient and they are unchanged from previous note.  ALLERGIES:  is allergic to shellfish allergy and sulfa antibiotics.  MEDICATIONS:  Current Outpatient Prescriptions  Medication Sig Dispense Refill  . BIOTIN PO Take 1,000 mcg by mouth daily.     . Calcium Carbonate-Vit D-Min (CALTRATE 600+D PLUS PO) Take by mouth.    . cholecalciferol (VITAMIN D) 1000 UNITS tablet Take 1,000 Units by mouth daily.    . CRESTOR 10 MG tablet Take 5 mg by mouth daily.  3  . letrozole (FEMARA) 2.5 MG tablet Take 1 tablet (2.5 mg total) by mouth daily. 90 tablet 6  . levothyroxine (SYNTHROID, LEVOTHROID) 25 MCG tablet Take 25 mcg by mouth daily.    . Multiple Vitamin (MULTIVITAMIN) tablet Take 1 tablet by mouth daily.    . Omega-3 Fatty Acids (OMEGA 3 PO) Take by mouth.    . venlafaxine XR (EFFEXOR-XR) 37.5 MG 24 hr capsule Take 1 capsule (37.5 mg total) by mouth daily with breakfast. (Patient not taking: Reported on 04/11/2014) 30 capsule 6   No current facility-administered medications for this visit.    PHYSICAL EXAMINATION: ECOG PERFORMANCE STATUS: 0 - Asymptomatic  Filed Vitals:   04/11/14 0822  BP: 138/81  Pulse: 88  Temp: 98.6 F (37 C)  Resp: 20   Filed Weights   04/11/14 0822  Weight: 122 lb 9.6 oz (55.611 kg)    GENERAL:alert, no distress  and comfortable SKIN: skin color, texture, turgor are normal, no rashes or significant lesions EYES: normal, Conjunctiva are pink and non-injected, sclera clear OROPHARYNX:no exudate, no erythema and lips, buccal mucosa, and tongue normal  NECK: supple, thyroid normal size, non-tender,  without nodularity LYMPH:  no palpable lymphadenopathy in the cervical, axillary or inguinal LUNGS: clear to auscultation and percussion with normal breathing effort HEART: regular rate & rhythm and no murmurs and no lower extremity edema ABDOMEN:abdomen soft, non-tender and normal bowel sounds Musculoskeletal:no cyanosis of digits and no clubbing  NEURO: alert & oriented x 3 with fluent speech, no focal motor/sensory deficits BREAST: No palpable masses or nodules in either right or left breasts around reconstruction. No palpable axillary supraclavicular or infraclavicular adenopathy.   LABORATORY DATA:  I have reviewed the data as listed   Chemistry      Component Value Date/Time   NA 142 04/11/2014 0804   NA 140 06/26/2011 1251   K 4.0 04/11/2014 0804   K 4.5 06/26/2011 1251   CL 105 08/06/2012 1313   CL 103 06/26/2011 1251   CO2 28 04/11/2014 0804   CO2 27 06/26/2011 1251   BUN 13.4 04/11/2014 0804   BUN 13 06/26/2011 1251   CREATININE 0.8 04/11/2014 0804   CREATININE 0.87 06/26/2011 1251      Component Value Date/Time   CALCIUM 9.1 04/11/2014 0804   CALCIUM 9.4 06/26/2011 1251   ALKPHOS 91 04/11/2014 0804   ALKPHOS 72 06/26/2011 1251   AST 16 04/11/2014 0804   AST 17 06/26/2011 1251   ALT 16 04/11/2014 0804   ALT 13 06/26/2011 1251   BILITOT 0.48 04/11/2014 0804   BILITOT 0.3 06/26/2011 1251       Lab Results  Component Value Date   WBC 4.3 04/11/2014   HGB 13.0 04/11/2014   HCT 39.5 04/11/2014   MCV 93.4 04/11/2014   PLT 359 04/11/2014   NEUTROABS 1.9 04/11/2014   ASSESSMENT & PLAN:  Breast cancer of lower-outer quadrant of left female breast Left breast cancer invasive ductal carcinoma 2.5 cm in size grade 1, ER 99%, PR 41%, Ki-67 11%, HER-2 negative T2 N0 M0 stage II a status post neoadjuvant chemotherapy followed by surgery February 2011 currently on antiestrogen therapy with Femara from January 2011  Femara adverse effects: 1. Hot flashes: On  Effexor 2. Myalgias: Probably not related to aromatase inhibitors but will related to cholesterol medication. Since there was a change in the cholesterol medication her symptoms have improved markedly. 3. Difficulty with sleep related to hot flashes 4. Weight gain 5. Osteopenia calcium and vitamin D supplementation; next bone density will be scheduled for July 2016 at [facility] Patient would complete 5 years of aromatase inhibitor therapy as of January 2016. She wishes to continue aromatase and immunotherapy with the full understanding and knowledge that there is no data to support it  And there are risks to antiestrogen therapy including the risk of osteoporosis, uterine bleeding/cancer, DVT risk.  Breast cancer surveillance: Breast and axilla exam 04/11/2014 is normal Since she had bilateral mastectomies no role of imaging studies   Return to clinic in 6 months for follow-up    Orders Placed This Encounter  Procedures  . DG Bone Density    Standing Status: Future     Number of Occurrences:      Standing Expiration Date: 04/11/2015    Order Specific Question:  Reason for Exam (SYMPTOM  OR DIAGNOSIS REQUIRED)    Answer:  on aromatase inhibitor with  osteopenia follow up    Order Specific Question:  Is the patient pregnant?    Answer:  No    Order Specific Question:  Preferred imaging location?    Answer:  External     Comments:  [facility]   The patient has a good understanding of the overall plan. she agrees with it. She will call with any problems that may develop before her next visit here.   Rulon Eisenmenger, MD 04/11/2014 8:52 AM

## 2014-04-11 NOTE — Assessment & Plan Note (Addendum)
Left breast cancer invasive ductal carcinoma 2.5 cm in size grade 1, ER 99%, PR 41%, Ki-67 11%, HER-2 negative T2 N0 M0 stage II a status post neoadjuvant chemotherapy followed by surgery February 2011 currently on antiestrogen therapy with Femara from January 2011  Femara adverse effects: 1. Hot flashes: On Effexor 2. Myalgias: Probably not related to aromatase inhibitors but will related to cholesterol medication. Since there was a change in the cholesterol medication her symptoms have improved markedly. 3. Difficulty with sleep related to hot flashes 4. Weight gain 5. Osteopenia calcium and vitamin D supplementation; next bone density will be scheduled for July 2016 at [facility] Patient would complete 5 years of aromatase inhibitor therapy as of January 2016. She wishes to continue aromatase and immunotherapy with the full understanding and knowledge that there is no data to support it  And there are risks to antiestrogen therapy including the risk of osteoporosis, uterine bleeding/cancer, DVT risk.  Breast cancer surveillance: Breast and axilla exam 04/11/2014 is normal Since she had bilateral mastectomies no role of imaging studies   Return to clinic in 6 months for follow-up  

## 2014-04-11 NOTE — Telephone Encounter (Signed)
per pof to sch pt appt-gave pt copy of sch °

## 2014-04-12 ENCOUNTER — Ambulatory Visit: Payer: PRIVATE HEALTH INSURANCE | Admitting: Hematology and Oncology

## 2014-04-12 ENCOUNTER — Other Ambulatory Visit: Payer: PRIVATE HEALTH INSURANCE

## 2014-10-10 NOTE — Assessment & Plan Note (Signed)
Left breast cancer invasive ductal carcinoma 2.5 cm in size grade 1, ER 99%, PR 41%, Ki-67 11%, HER-2 negative T2 N0 M0 stage II a status post neoadjuvant chemotherapy followed by surgery February 2011 currently on antiestrogen therapy with Femara from January 2011  Femara adverse effects: 1. Hot flashes: On Effexor 2. Myalgias: Probably not related to aromatase inhibitors but will related to cholesterol medication. Since there was a change in the cholesterol medication her symptoms have improved markedly. 3. Difficulty with sleep related to hot flashes 4. Weight gain 5. Osteopenia calcium and vitamin D supplementation; next bone density will be scheduled for July 2016 at [facility] Patient would complete 5 years of aromatase inhibitor therapy as of January 2016. She wishes to continue aromatase and immunotherapy with the full understanding and knowledge that there is no data to support it  And there are risks to antiestrogen therapy including the risk of osteoporosis, uterine bleeding/cancer, DVT risk.  Breast cancer surveillance: Breast and axilla exam 04/11/2014 is normal Since she had bilateral mastectomies no role of imaging studies   Return to clinic in 12 months for follow-up 

## 2014-10-11 ENCOUNTER — Encounter: Payer: Self-pay | Admitting: Hematology and Oncology

## 2014-10-11 ENCOUNTER — Telehealth: Payer: Self-pay | Admitting: Hematology and Oncology

## 2014-10-11 ENCOUNTER — Ambulatory Visit (HOSPITAL_BASED_OUTPATIENT_CLINIC_OR_DEPARTMENT_OTHER): Payer: 59 | Admitting: Hematology and Oncology

## 2014-10-11 VITALS — BP 135/86 | HR 76 | Temp 98.7°F | Resp 18 | Ht 64.0 in | Wt 119.5 lb

## 2014-10-11 DIAGNOSIS — M791 Myalgia: Secondary | ICD-10-CM

## 2014-10-11 DIAGNOSIS — C50512 Malignant neoplasm of lower-outer quadrant of left female breast: Secondary | ICD-10-CM | POA: Diagnosis not present

## 2014-10-11 DIAGNOSIS — M8580 Other specified disorders of bone density and structure, unspecified site: Secondary | ICD-10-CM

## 2014-10-11 DIAGNOSIS — Z170 Estrogen receptor positive status [ER+]: Secondary | ICD-10-CM | POA: Diagnosis not present

## 2014-10-11 DIAGNOSIS — N951 Menopausal and female climacteric states: Secondary | ICD-10-CM

## 2014-10-11 DIAGNOSIS — Z79811 Long term (current) use of aromatase inhibitors: Secondary | ICD-10-CM

## 2014-10-11 NOTE — Progress Notes (Signed)
Patient Care Team: [name], PA-C as PCP - General (Nurse Practitioner)  DIAGNOSIS: No matching staging information was found for the patient.  SUMMARY OF ONCOLOGIC HISTORY:   Breast cancer of lower-outer quadrant of left female breast   12/13/2008 Initial Diagnosis Left breast biopsy: Invasive ductal carcinoma ER 90% percent, PR 41%, Ki-67 11%, HER-2 negative ratio 1, Oncotype DX score 23, ROR15%   01/27/2009 - 03/10/2009 Neo-Adjuvant Chemotherapy Neoadjuvant FEC 4; participant in the "bed sheets" study.   05/12/2009 -  Anti-estrogen oral therapy Femara 2.5 mg daily   05/22/2009 Surgery Bilateral mastectomy stage IIB T2 N0 M0 IDC left breast grade 1, 2.5 cm, ER 99%, PR 41%, Ki-67 11%, HER-2 negative   12/10/2009 Surgery Breast reconstruction surgery    CHIEF COMPLIANT: Follow-up of breast cancer on Femara  INTERVAL HISTORY: Elizabeth [name] is a 59 year old with above-mentioned history of left breast cancer treated with beam adjuvant chemotherapy followed by bilateral mastectomies currently on antiestrogen therapy with Femara are generally 2011. She is tolerating it fairly well without any major problems.  REVIEW OF SYSTEMS:   Constitutional: Denies fevers, chills or abnormal weight loss Eyes: Denies blurriness of vision Ears, nose, mouth, throat, and face: Denies mucositis or sore throat Respiratory: Denies cough, dyspnea or wheezes Cardiovascular: Denies palpitation, chest discomfort or lower extremity swelling Gastrointestinal:  Denies nausea, heartburn or change in bowel habits Skin: Denies abnormal skin rashes Lymphatics: Denies new lymphadenopathy or easy bruising Neurological:Denies numbness, tingling or new weaknesses Behavioral/Psych: Mood is stable, no new changes  Breast:  Patient had bilateral mastectomies All other systems were reviewed with the patient and are negative.  I have reviewed the past medical history, past surgical history, social history and family history  with the patient and they are unchanged from previous note.  ALLERGIES:  is allergic to shellfish allergy and sulfa antibiotics.  MEDICATIONS:  Current Outpatient Prescriptions  Medication Sig Dispense Refill  . BIOTIN PO Take 1,000 mcg by mouth daily.     . Calcium Carbonate-Vit D-Min (CALTRATE 600+D PLUS PO) Take by mouth.    . cholecalciferol (VITAMIN D) 1000 UNITS tablet Take 1,000 Units by mouth daily.    . CRESTOR 10 MG tablet Take 5 mg by mouth daily.  3  . letrozole (FEMARA) 2.5 MG tablet Take 1 tablet (2.5 mg total) by mouth daily. 90 tablet 6  . levothyroxine (SYNTHROID, LEVOTHROID) 25 MCG tablet Take 25 mcg by mouth daily.    . Multiple Vitamin (MULTIVITAMIN) tablet Take 1 tablet by mouth daily.    . Omega-3 Fatty Acids (OMEGA 3 PO) Take by mouth.    . venlafaxine XR (EFFEXOR-XR) 37.5 MG 24 hr capsule Take 1 capsule (37.5 mg total) by mouth daily with breakfast. (Patient not taking: Reported on 04/11/2014) 30 capsule 6   No current facility-administered medications for this visit.    PHYSICAL EXAMINATION: ECOG PERFORMANCE STATUS: 0 - Asymptomatic  There were no vitals filed for this visit. There were no vitals filed for this visit.  GENERAL:alert, no distress and comfortable SKIN: skin color, texture, turgor are normal, no rashes or significant lesions EYES: normal, Conjunctiva are pink and non-injected, sclera clear OROPHARYNX:no exudate, no erythema and lips, buccal mucosa, and tongue normal  NECK: supple, thyroid normal size, non-tender, without nodularity LYMPH:  no palpable lymphadenopathy in the cervical, axillary or inguinal LUNGS: clear to auscultation and percussion with normal breathing effort HEART: regular rate & rhythm and no murmurs and no lower extremity edema ABDOMEN:abdomen soft, non-tender and  normal bowel sounds Musculoskeletal:no cyanosis of digits and no clubbing  NEURO: alert & oriented x 3 with fluent speech, no focal motor/sensory  deficits BREAST: No palpable masses or nodules in either right or left breasts. No palpable axillary supraclavicular or infraclavicular adenopathy no breast tenderness or nipple discharge. (exam performed in the presence of a chaperone)  LABORATORY DATA:  I have reviewed the data as listed   Chemistry      Component Value Date/Time   NA 142 04/11/2014 0804   NA 140 06/26/2011 1251   K 4.0 04/11/2014 0804   K 4.5 06/26/2011 1251   CL 105 08/06/2012 1313   CL 103 06/26/2011 1251   CO2 28 04/11/2014 0804   CO2 27 06/26/2011 1251   BUN 13.4 04/11/2014 0804   BUN 13 06/26/2011 1251   CREATININE 0.8 04/11/2014 0804   CREATININE 0.87 06/26/2011 1251      Component Value Date/Time   CALCIUM 9.1 04/11/2014 0804   CALCIUM 9.4 06/26/2011 1251   ALKPHOS 91 04/11/2014 0804   ALKPHOS 72 06/26/2011 1251   AST 16 04/11/2014 0804   AST 17 06/26/2011 1251   ALT 16 04/11/2014 0804   ALT 13 06/26/2011 1251   BILITOT 0.48 04/11/2014 0804   BILITOT 0.3 06/26/2011 1251       Lab Results  Component Value Date   WBC 4.3 04/11/2014   HGB 13.0 04/11/2014   HCT 39.5 04/11/2014   MCV 93.4 04/11/2014   PLT 359 04/11/2014   NEUTROABS 1.9 04/11/2014   ASSESSMENT & PLAN:  Breast cancer of lower-outer quadrant of left female breast Left breast cancer invasive ductal carcinoma 2.5 cm in size grade 1, ER 99%, PR 41%, Ki-67 11%, HER-2 negative T2 N0 M0 stage II a status post neoadjuvant chemotherapy followed by surgery February 2011 currently on antiestrogen therapy with Femara from January 2011  Femara adverse effects: 1. Hot flashes: On Effexor 2. Myalgias: Probably not related to aromatase inhibitors but will related to cholesterol medication. Since there was a change in the cholesterol medication her symptoms have improved markedly. 3. Difficulty with sleep related to hot flashes 4. Weight gain 5. Osteopenia calcium and vitamin D supplementation; next bone density will be scheduled for July 2016  at [facility]  Patient would complete 5 years of aromatase inhibitor therapy as of January 2016. She wishes to continue aromatase and immunotherapy with the full understanding and knowledge that there is no data to support it  And there are risks to antiestrogen therapy including the risk of osteoporosis, uterine bleeding/cancer, DVT risk.  Breast cancer surveillance: Breast and axilla exam 10/11/2014 is normal Since she had bilateral mastectomies no role of imaging studies   Return to clinic in 12 months for follow-up   No orders of the defined types were placed in this encounter.   The patient has a good understanding of the overall plan. she agrees with it. she will call with any problems that may develop before the next visit here.   Rulon Eisenmenger, MD     His business patient is

## 2014-10-11 NOTE — Telephone Encounter (Signed)
Appointments made and avs printed for patient °

## 2014-10-31 ENCOUNTER — Telehealth: Payer: Self-pay

## 2014-11-04 ENCOUNTER — Other Ambulatory Visit: Payer: Self-pay | Admitting: Hematology and Oncology

## 2014-11-04 ENCOUNTER — Other Ambulatory Visit: Payer: Self-pay | Admitting: Physician Assistant

## 2014-11-04 DIAGNOSIS — M8580 Other specified disorders of bone density and structure, unspecified site: Secondary | ICD-10-CM

## 2014-11-04 NOTE — Telephone Encounter (Signed)
Charting error.

## 2014-11-09 ENCOUNTER — Telehealth: Payer: Self-pay

## 2014-11-09 NOTE — Telephone Encounter (Signed)
Lab results dtd 11/02/14 rcvd from [place].  Reviewed by Dr. Lindi Adie.  Sent to scan.

## 2015-04-11 ENCOUNTER — Other Ambulatory Visit: Payer: Self-pay | Admitting: Hematology and Oncology

## 2015-04-12 ENCOUNTER — Other Ambulatory Visit: Payer: Self-pay | Admitting: *Deleted

## 2015-04-12 DIAGNOSIS — Z853 Personal history of malignant neoplasm of breast: Secondary | ICD-10-CM

## 2015-04-12 DIAGNOSIS — C50912 Malignant neoplasm of unspecified site of left female breast: Secondary | ICD-10-CM

## 2015-04-12 MED ORDER — LETROZOLE 2.5 MG PO TABS: 2.5000 mg | ORAL_TABLET | Freq: Every day | ORAL | Status: DC

## 2015-05-17 ENCOUNTER — Other Ambulatory Visit: Payer: 59

## 2015-07-11 ENCOUNTER — Ambulatory Visit
Admission: RE | Admit: 2015-07-11 | Discharge: 2015-07-11 | Disposition: A | Discharge status: Home / Self Care | Payer: 59 | Source: Ambulatory Visit | Attending: Physician Assistant | Admitting: Physician Assistant

## 2015-07-11 DIAGNOSIS — M8580 Other specified disorders of bone density and structure, unspecified site: Secondary | ICD-10-CM

## 2015-10-09 ENCOUNTER — Telehealth: Payer: Self-pay | Admitting: Hematology and Oncology

## 2015-10-09 NOTE — Telephone Encounter (Signed)
lvm to inform pt of r/s appt 6/29 to 6/28 per [facility]G 6/26, meeting 10am-noon [place] cxl 6/28

## 2015-10-09 NOTE — Telephone Encounter (Signed)
Patient called to move 6/28 f/u to 7/5 @ 11:45 am. Patient has new date/time.

## 2015-10-11 ENCOUNTER — Ambulatory Visit: Payer: 59 | Admitting: Hematology and Oncology

## 2015-10-12 ENCOUNTER — Ambulatory Visit: Payer: 59 | Admitting: Hematology and Oncology

## 2015-10-17 NOTE — Assessment & Plan Note (Signed)
Left breast cancer invasive ductal carcinoma 2.5 cm in size grade 1, ER 99%, PR 41%, Ki-67 11%, HER-2 negative T2 N0 M0 stage II a status post neoadjuvant chemotherapy followed by surgery February 2011 currently on antiestrogen therapy with Femara from January 2011  Femara adverse effects: 1. Hot flashes: On Effexor 2. Myalgias: Probably not related to aromatase inhibitors but will related to cholesterol medication. Since there was a change in the cholesterol medication her symptoms have improved markedly. 3. Difficulty with sleep related to hot flashes 4. Weight gain 5. Osteopenia calcium and vitamin D supplementation; next bone density will be scheduled for July 2016 at [facility]  Patient completed 5 years of aromatase inhibitor therapy as of January 2016. She wishes to continue aromatase inhibitor for 5 more years.  Breast cancer surveillance: Breast and axilla exam 10/18/15 is normal Since she had bilateral mastectomies no role of imaging studies   Return to clinic in 12 months for follow-up

## 2015-10-18 ENCOUNTER — Encounter: Payer: Self-pay | Admitting: Hematology and Oncology

## 2015-10-18 ENCOUNTER — Ambulatory Visit (HOSPITAL_BASED_OUTPATIENT_CLINIC_OR_DEPARTMENT_OTHER): Payer: 59 | Admitting: Hematology and Oncology

## 2015-10-18 VITALS — BP 135/90 | HR 85 | Temp 98.4°F | Resp 18 | Ht 64.0 in | Wt 116.7 lb

## 2015-10-18 DIAGNOSIS — M8580 Other specified disorders of bone density and structure, unspecified site: Secondary | ICD-10-CM

## 2015-10-18 DIAGNOSIS — Z79811 Long term (current) use of aromatase inhibitors: Secondary | ICD-10-CM

## 2015-10-18 DIAGNOSIS — C50512 Malignant neoplasm of lower-outer quadrant of left female breast: Secondary | ICD-10-CM

## 2015-10-18 DIAGNOSIS — Z170 Estrogen receptor positive status [ER+]: Secondary | ICD-10-CM | POA: Diagnosis not present

## 2015-10-18 NOTE — Progress Notes (Signed)
Patient Care Team: [name], PA-C as PCP - General (Nurse Practitioner)  SUMMARY OF ONCOLOGIC HISTORY:   Breast cancer of lower-outer quadrant of left female breast ([place])   12/13/2008 Initial Diagnosis Left breast biopsy: Invasive ductal carcinoma ER 90% percent, PR 41%, Ki-67 11%, HER-2 negative ratio 1, Oncotype DX score 23, ROR15%   01/27/2009 - 03/10/2009 Neo-Adjuvant Chemotherapy Neoadjuvant FEC 4; participant in the "bed sheets" study.   05/12/2009 -  Anti-estrogen oral therapy Femara 2.5 mg daily   05/22/2009 Surgery Bilateral mastectomy stage IIB T2 N0 M0 IDC left breast grade 1, 2.5 cm, ER 99%, PR 41%, Ki-67 11%, HER-2 negative   12/10/2009 Surgery Breast reconstruction surgery    CHIEF COMPLIANT: Follow-up on antiestrogen therapy with Femara  INTERVAL HISTORY: Elizabeth [name] is a 60 year old with above-mentioned history of left breast cancer treated with bilateral mastectomies with reconstruction currently on antiestrogen therapy with Femara. She has been on it since January 2011 and had completed 6-1/2 years. Recently she has had multiple grandchildren. These grandchildren were born around 94 weeks because of her twins. One of the twins did not survive but she currently has 2 twins. They are doing well and she is very happy about that. She has to help her daughter take care of the twins and this is causing her lots of musculoskeletal aches and pains.  REVIEW OF SYSTEMS:   Constitutional: Denies fevers, chills or abnormal weight loss Eyes: Denies blurriness of vision Ears, nose, mouth, throat, and face: Denies mucositis or sore throat Respiratory: Denies cough, dyspnea or wheezes Cardiovascular: Denies palpitation, chest discomfort Gastrointestinal:  Denies nausea, heartburn or change in bowel habits Skin: Denies abnormal skin rashes Lymphatics: Denies new lymphadenopathy or easy bruising Neurological:Denies numbness, tingling or new weaknesses Behavioral/Psych: Mood is  stable, no new changes  Extremities: No lower extremity edema Breast:  denies any pain or lumps or nodules in either breasts All other systems were reviewed with the patient and are negative.  I have reviewed the past medical history, past surgical history, social history and family history with the patient and they are unchanged from previous note.  ALLERGIES:  is allergic to shellfish allergy and sulfa antibiotics.  MEDICATIONS:  Current Outpatient Prescriptions  Medication Sig Dispense Refill  . BIOTIN PO Take 1,000 mcg by mouth daily.     . Calcium Carbonate-Vit D-Min (CALTRATE 600+D PLUS PO) Take by mouth.    . cholecalciferol (VITAMIN D) 1000 UNITS tablet Take 1,000 Units by mouth daily.    . CRESTOR 10 MG tablet Take 5 mg by mouth daily.  3  . letrozole (FEMARA) 2.5 MG tablet Take 1 tablet (2.5 mg total) by mouth daily. 90 tablet 3  . levothyroxine (SYNTHROID, LEVOTHROID) 25 MCG tablet Take 25 mcg by mouth daily.    . Multiple Vitamin (MULTIVITAMIN) tablet Take 1 tablet by mouth daily.    . Omega-3 Fatty Acids (OMEGA 3 PO) Take by mouth.    . venlafaxine XR (EFFEXOR-XR) 37.5 MG 24 hr capsule Take 1 capsule (37.5 mg total) by mouth daily with breakfast. 30 capsule 6   No current facility-administered medications for this visit.    PHYSICAL EXAMINATION: ECOG PERFORMANCE STATUS: 1 - Symptomatic but completely ambulatory  Filed Vitals:   10/18/15 1142  BP: 135/90  Pulse: 85  Temp: 98.4 F (36.9 C)  Resp: 18   Filed Weights   10/18/15 1142  Weight: 116 lb 11.2 oz (52.935 kg)    GENERAL:alert, no distress and comfortable SKIN:  skin color, texture, turgor are normal, no rashes or significant lesions EYES: normal, Conjunctiva are pink and non-injected, sclera clear OROPHARYNX:no exudate, no erythema and lips, buccal mucosa, and tongue normal  NECK: supple, thyroid normal size, non-tender, without nodularity LYMPH:  no palpable lymphadenopathy in the cervical, axillary or  inguinal LUNGS: clear to auscultation and percussion with normal breathing effort HEART: regular rate & rhythm and no murmurs and no lower extremity edema ABDOMEN:abdomen soft, non-tender and normal bowel sounds MUSCULOSKELETAL:no cyanosis of digits and no clubbing  NEURO: alert & oriented x 3 with fluent speech, no focal motor/sensory deficits EXTREMITIES: No lower extremity edema BREAST: No palpable masses or nodules in either right or left Axilla. No palpable axillary supraclavicular or infraclavicular adenopathy . (exam performed in the presence of a chaperone)  LABORATORY DATA:  I have reviewed the data as listed   Chemistry      Component Value Date/Time   NA 142 04/11/2014 0804   NA 140 06/26/2011 1251   K 4.0 04/11/2014 0804   K 4.5 06/26/2011 1251   CL 105 08/06/2012 1313   CL 103 06/26/2011 1251   CO2 28 04/11/2014 0804   CO2 27 06/26/2011 1251   BUN 13.4 04/11/2014 0804   BUN 13 06/26/2011 1251   CREATININE 0.8 04/11/2014 0804   CREATININE 0.87 06/26/2011 1251      Component Value Date/Time   CALCIUM 9.1 04/11/2014 0804   CALCIUM 9.4 06/26/2011 1251   ALKPHOS 91 04/11/2014 0804   ALKPHOS 72 06/26/2011 1251   AST 16 04/11/2014 0804   AST 17 06/26/2011 1251   ALT 16 04/11/2014 0804   ALT 13 06/26/2011 1251   BILITOT 0.48 04/11/2014 0804   BILITOT 0.3 06/26/2011 1251       Lab Results  Component Value Date   WBC 4.3 04/11/2014   HGB 13.0 04/11/2014   HCT 39.5 04/11/2014   MCV 93.4 04/11/2014   PLT 359 04/11/2014   NEUTROABS 1.9 04/11/2014     ASSESSMENT & PLAN:  Breast cancer of lower-outer quadrant of left female breast Left breast cancer invasive ductal carcinoma 2.5 cm in size grade 1, ER 99%, PR 41%, Ki-67 11%, HER-2 negative T2 N0 M0 stage II a status post neoadjuvant chemotherapy followed by surgery February 2011 currently on antiestrogen therapy with Femara from January 2011  Femara adverse effects: 1. Hot flashes: On Effexor 2. Myalgias:  Probably not related to aromatase inhibitors but will related to cholesterol medication. Since there was a change in the cholesterol medication her symptoms have improved markedly. 3. Difficulty with sleep related to hot flashes 4. Weight gain 5. Osteopenia calcium and vitamin D supplementation; next bone density will be scheduled [facility] July 2016 [facility]  Patient completed 6 years of aromatase inhibitor therapy as of January 2017. We discussed the pros and cons of continuing antiestrogen therapy. I recommended sending for breast cancer index to determine if she will need to continue it for 10 years. She currently wants to take a break from antiestrogen therapy. I recommended that she can take a break until the fall. Even if she were to need to stay on antiestrogen therapy for the full 10 years, she plans to take every summer off if possible.  Breast cancer surveillance: Breast and axilla exam 10/18/15 is normal Since she had bilateral mastectomies no role of imaging studies   Return to clinic in 12 months for follow-up   No orders of the defined types were placed in this encounter.  The patient has a good understanding of the overall plan. she agrees with it. she will call with any problems that may develop before the next visit here.   Rulon Eisenmenger, MD 10/18/2015

## 2015-10-19 ENCOUNTER — Telehealth: Payer: Self-pay | Admitting: *Deleted

## 2015-10-19 NOTE — Telephone Encounter (Signed)
Received order per Dr. Lindi Adie for BCI testing. Requisition sent to [facility].

## 2015-11-14 ENCOUNTER — Encounter (HOSPITAL_COMMUNITY): Payer: Self-pay

## 2015-11-14 ENCOUNTER — Telehealth: Payer: Self-pay | Admitting: Hematology and Oncology

## 2015-11-14 ENCOUNTER — Other Ambulatory Visit (HOSPITAL_COMMUNITY)
Admission: RE | Admit: 2015-11-14 | Discharge: 2015-11-14 | Disposition: A | Discharge status: Home / Self Care | Payer: 59 | Source: Ambulatory Visit | Attending: Family Medicine | Admitting: Family Medicine

## 2015-11-14 ENCOUNTER — Other Ambulatory Visit: Payer: Self-pay | Admitting: Physician Assistant

## 2015-11-14 DIAGNOSIS — Z124 Encounter for screening for malignant neoplasm of cervix: Secondary | ICD-10-CM | POA: Diagnosis present

## 2015-11-14 NOTE — Telephone Encounter (Signed)
I called and discussed with the patient the result of breast cancer index which showed low risk of distant recurrence of 3.1%. Although the predictive BCI suggests that she would have high likelihood of benefit from extended adjuvant therapy, I do not recommend continuation of antiestrogen therapy any further. Patient is very happy to hear this information. She wishes to follow with her primary care physician for annual surveillance. She currently has a one-year follow-up to see me. She may try to cancel this appointment.

## 2015-11-15 LAB — CYTOLOGY - PAP

## 2016-09-06 ENCOUNTER — Telehealth: Payer: Self-pay | Admitting: Hematology and Oncology

## 2016-09-06 NOTE — Telephone Encounter (Signed)
Thank you for letting us know.  

## 2016-09-06 NOTE — Telephone Encounter (Signed)
Patient called to say that she had to have more surgery and would call back to reschedule after she knew when the surgery would be

## 2016-10-17 ENCOUNTER — Ambulatory Visit: Payer: 59 | Admitting: Hematology and Oncology

## 2016-10-24 ENCOUNTER — Ambulatory Visit: Payer: 59 | Admitting: Hematology and Oncology

## 2017-04-28 ENCOUNTER — Other Ambulatory Visit: Payer: Self-pay | Admitting: Orthopedic Surgery

## 2017-05-09 ENCOUNTER — Other Ambulatory Visit: Payer: Self-pay | Admitting: Orthopedic Surgery

## 2017-05-15 ENCOUNTER — Encounter (HOSPITAL_COMMUNITY): Payer: Self-pay

## 2017-05-15 NOTE — Pre-Procedure Instructions (Addendum)
Elizabeth [name]  05/15/2017      [id number] [address], [place] [address] [place] [address] Phone: [phone] Fax: [phone]    Your procedure is scheduled on Mon. Feb. 11  Report to [facility] Admitting at 5:30 A.M.  Call this number if you have problems the morning of surgery:  [phone]   Remember:  Do not eat food or drink liquids after midnight on Sun. Feb. 10   Take these medicines the morning of surgery with A SIP OF WATER : levothyroxine (synthroid),eye drops                 7 days prior to surgery STOP taking any Aspirin(unless otherwise instructed by your surgeon), Aleve, Naproxen, Ibuprofen, Motrin, Advil, Goody's, BC's, all herbal medications, fish oil, and all vitamins   Do not wear jewelry, make-up or nail polish.  Do not wear lotions, powders, or perfumes, or deodorant.  Do not shave 48 hours prior to surgery.  Men may shave face and neck.  Do not bring valuables to the hospital.  [facility] is not responsible for any belongings or valuables.  Contacts, dentures or bridgework may not be worn into surgery.  Leave your suitcase in the car.  After surgery it may be brought to your room.  For patients admitted to the hospital, discharge time will be determined by your treatment team.  Patients discharged the day of surgery will not be allowed to drive home.     Special instructions:   [name]- Preparing For Surgery  Before surgery, you can play an important role. Because skin is not sterile, your skin needs to be as free of germs as possible. You can reduce the number of germs on your skin by washing with CHG (chlorahexidine gluconate) Soap before surgery.  CHG is an antiseptic cleaner which kills germs and bonds with the skin to continue killing germs even after washing.  Please do not use if you have an allergy to CHG or antibacterial soaps. If your skin becomes reddened/irritated stop using the CHG.   Do not shave (including legs and underarms) for at least 48 hours prior to first CHG shower. It is OK to shave your face.  Please follow these instructions carefully.   1. Shower the NIGHT BEFORE SURGERY and the MORNING OF SURGERY with CHG.   2. If you chose to wash your hair, wash your hair first as usual with your normal shampoo.  3. After you shampoo, rinse your hair and body thoroughly to remove the shampoo.  4. Use CHG as you would any other liquid soap. You can apply CHG directly to the skin and wash gently with a scrungie or a clean washcloth.   5. Apply the CHG Soap to your body ONLY FROM THE NECK DOWN.  Do not use on open wounds or open sores. Avoid contact with your eyes, ears, mouth and genitals (private parts). Wash Face and genitals (private parts)  with your normal soap.  6. Wash thoroughly, paying special attention to the area where your surgery will be performed.  7. Thoroughly rinse your body with warm water from the neck down.  8. DO NOT shower/wash with your normal soap after using and rinsing off the CHG Soap.  9. Pat yourself dry with a CLEAN TOWEL.  10. Wear CLEAN PAJAMAS to bed the night before surgery, wear comfortable clothes the morning of surgery  11. Place CLEAN SHEETS on your  bed the night of your first shower and DO NOT SLEEP WITH PETS.    Day of Surgery: Do not apply any deodorants/lotions. Please wear clean clothes to the hospital/surgery center.      Please read over the following fact sheets that you were given. Coughing and Deep Breathing and Surgical Site Infection Prevention

## 2017-05-16 ENCOUNTER — Other Ambulatory Visit: Payer: Self-pay

## 2017-05-16 ENCOUNTER — Ambulatory Visit (HOSPITAL_COMMUNITY)
Admission: RE | Admit: 2017-05-16 | Discharge: 2017-05-16 | Disposition: A | Discharge status: Home / Self Care | Payer: 59 | Source: Ambulatory Visit | Attending: Orthopedic Surgery | Admitting: Orthopedic Surgery

## 2017-05-16 ENCOUNTER — Encounter (HOSPITAL_COMMUNITY): Payer: Self-pay

## 2017-05-16 ENCOUNTER — Encounter (HOSPITAL_COMMUNITY)
Admission: RE | Admit: 2017-05-16 | Discharge: 2017-05-16 | Disposition: A | Discharge status: Home / Self Care | Payer: 59 | Source: Ambulatory Visit | Attending: Orthopedic Surgery | Admitting: Orthopedic Surgery

## 2017-05-16 DIAGNOSIS — Z01812 Encounter for preprocedural laboratory examination: Secondary | ICD-10-CM | POA: Insufficient documentation

## 2017-05-16 DIAGNOSIS — Z01818 Encounter for other preprocedural examination: Secondary | ICD-10-CM

## 2017-05-16 HISTORY — DX: Family history of other specified conditions: Z84.89

## 2017-05-16 HISTORY — DX: Personal history of antineoplastic chemotherapy: Z92.21

## 2017-05-16 LAB — CBC WITH DIFFERENTIAL/PLATELET
BASOS ABS: 0 10*3/uL (ref 0.0–0.1)
BASOS PCT: 0 %
EOS ABS: 0.1 10*3/uL (ref 0.0–0.7)
Eosinophils Relative: 2 %
HCT: 41.2 % (ref 36.0–46.0)
Hemoglobin: 13.4 g/dL (ref 12.0–15.0)
Lymphocytes Relative: 41 %
Lymphs Abs: 1.9 10*3/uL (ref 0.7–4.0)
MCH: 30.7 pg (ref 26.0–34.0)
MCHC: 32.5 g/dL (ref 30.0–36.0)
MCV: 94.5 fL (ref 78.0–100.0)
Monocytes Absolute: 0.5 10*3/uL (ref 0.1–1.0)
Monocytes Relative: 11 %
Neutro Abs: 2.1 10*3/uL (ref 1.7–7.7)
Neutrophils Relative %: 46 %
PLATELETS: 388 10*3/uL (ref 150–400)
RBC: 4.36 MIL/uL (ref 3.87–5.11)
RDW: 12.8 % (ref 11.5–15.5)
WBC: 4.6 10*3/uL (ref 4.0–10.5)

## 2017-05-16 LAB — BASIC METABOLIC PANEL
ANION GAP: 10 (ref 5–15)
BUN: 16 mg/dL (ref 6–20)
CALCIUM: 9.5 mg/dL (ref 8.9–10.3)
CO2: 27 mmol/L (ref 22–32)
Chloride: 104 mmol/L (ref 101–111)
Creatinine, Ser: 0.81 mg/dL (ref 0.44–1.00)
GFR calc Af Amer: >60 mL/min (ref >60)
GLUCOSE: 87 mg/dL (ref 65–99)
Potassium: 3.8 mmol/L (ref 3.5–5.1)
SODIUM: 141 mmol/L (ref 135–145)

## 2017-05-16 LAB — PROTIME-INR
INR: 0.96
PROTHROMBIN TIME: 12.7 s (ref 11.4–15.2)

## 2017-05-16 LAB — URINALYSIS, ROUTINE W REFLEX MICROSCOPIC
BILIRUBIN URINE: NEGATIVE
GLUCOSE, UA: 50 mg/dL — AB
HGB URINE DIPSTICK: NEGATIVE
Ketones, ur: NEGATIVE mg/dL
Leukocytes, UA: NEGATIVE
Nitrite: NEGATIVE
PH: 6 (ref 5.0–8.0)
Protein, ur: NEGATIVE mg/dL
SPECIFIC GRAVITY, URINE: 1.016 (ref 1.005–1.030)

## 2017-05-16 LAB — TYPE AND SCREEN
ABO/RH(D): O POS
ANTIBODY SCREEN: NEGATIVE

## 2017-05-16 LAB — SURGICAL PCR SCREEN
MRSA, PCR: NEGATIVE
STAPHYLOCOCCUS AUREUS: POSITIVE — AB

## 2017-05-16 LAB — ABO/RH: ABO/RH(D): O POS

## 2017-05-16 LAB — APTT: APTT: 29 s (ref 24–36)

## 2017-05-16 NOTE — Progress Notes (Addendum)
PCP: [name], PA @ [facility]  No cardiologist  [facility] [place]

## 2017-05-21 DIAGNOSIS — Z947 Corneal transplant status: Secondary | ICD-10-CM | POA: Diagnosis not present

## 2017-05-21 DIAGNOSIS — Z9841 Cataract extraction status, right eye: Secondary | ICD-10-CM | POA: Diagnosis not present

## 2017-05-21 DIAGNOSIS — Z9842 Cataract extraction status, left eye: Secondary | ICD-10-CM | POA: Diagnosis not present

## 2017-05-23 DIAGNOSIS — M1612 Unilateral primary osteoarthritis, left hip: Secondary | ICD-10-CM | POA: Diagnosis present

## 2017-05-23 MED ORDER — LACTATED RINGERS IV SOLN
INTRAVENOUS | Status: DC
  Administered 2017-05-26 (×2): via INTRAVENOUS

## 2017-05-23 MED ORDER — TRANEXAMIC ACID 1000 MG/10ML IV SOLN
2000.0000 mg | INTRAVENOUS | Status: AC
  Administered 2017-05-26: 2000 mg via TOPICAL
  Filled 2017-05-23: qty 20

## 2017-05-23 MED ORDER — CEFAZOLIN SODIUM-DEXTROSE 2-4 GM/100ML-% IV SOLN
2.0000 g | INTRAVENOUS | Status: AC
  Administered 2017-05-26: 2 g via INTRAVENOUS
  Filled 2017-05-23: qty 100

## 2017-05-23 NOTE — H&P (Signed)
TOTAL HIP ADMISSION H&P  Patient is admitted for left total hip arthroplasty.  Subjective:  Chief Complaint: left hip pain  HPI: Elizabeth [name], 62 y.o. female, has a history of pain and functional disability in the left hip(s) due to arthritis and patient has failed non-surgical conservative treatments for greater than 12 weeks to include NSAID's and/or analgesics, corticosteriod injections and activity modification.  Onset of symptoms was gradual starting 1 years ago with gradually worsening course since that time.The patient noted no past surgery on the left hip(s).  Patient currently rates pain in the left hip at 10 out of 10 with activity. Patient has night pain, worsening of pain with activity and weight bearing, pain that interfers with activities of daily living and pain with passive range of motion. Patient has evidence of subchondral cysts and joint space narrowing by imaging studies. This condition presents safety issues increasing the risk of falls.    There is no current active infection.  Patient Active Problem List   Diagnosis Date Noted  . Breast cancer of lower-outer quadrant of left female breast ([place]) 08/06/2012   Past Medical History:  Diagnosis Date  . Breast cancer ([place]) 2010   Left  . Chronic sinusitis   . Family history of adverse reaction to anesthesia    sister-nausea/vomiting  . History of chemotherapy 01/2009-03/2009  . Hypercholesteremia   . Hypothyroid    "inactive thyroid"  . Migraines   . RSD (reflex sympathetic dystrophy)     Past Surgical History:  Procedure Laterality Date  . BREAST RECONSTRUCTION    . EYE SURGERY Bilateral 7/18, 10/18  . KNEE ARTHROSCOPY Bilateral   . KNEE SURGERY Right    Two surgeries  . MASTECTOMY Bilateral 05/22/2009  . MASTECTOMY Bilateral 2011   restrictions on left arm-no labs/bloodpressure  . NASAL SINUS SURGERY    . PORTA CATH INSERTION  12/2008  . PORTA CATH REMOVAL  05/2009  . SHOULDER SURGERY Right     Current  Facility-Administered Medications  Medication Dose Route Frequency Provider Last Rate Last Dose  . [START ON 05/26/2017] ceFAZolin (ANCEF) IVPB 2g/100 mL premix  2 g Intravenous To Joellyn Haff, MD      . Derrill Memo ON 05/26/2017] lactated ringers infusion   Intravenous Continuous Frederik Pear, MD      . Derrill Memo ON 05/26/2017] tranexamic acid (CYKLOKAPRON) 2,000 mg in sodium chloride 0.9 % 50 mL Topical Application  5,284 mg Topical To OR Frederik Pear, MD       Current Outpatient Medications  Medication Sig Dispense Refill Last Dose  . Biotin 5000 MCG TABS Take 5,000 mcg by mouth daily.    Taking  . cholecalciferol (VITAMIN D) 1000 UNITS tablet Take 5,000 Units by mouth daily.    Taking  . levothyroxine (SYNTHROID, LEVOTHROID) 25 MCG tablet Take 25 mcg by mouth daily.   Taking  . Multiple Vitamin (MULTIVITAMIN) tablet Take 1 tablet by mouth daily.   Taking  . Omega-3 Fatty Acids (OMEGA 3 PO) Take 1 capsule by mouth daily.    Taking  . Phenylephrine-APAP-Guaifenesin (TYLENOL SINUS SEVERE) 5-325-200 MG TABS Take 1 tablet by mouth 3 (three) times daily as needed (for sinus congestion).     . prednisoLONE acetate (PRED FORTE) 1 % ophthalmic suspension Place 1 drop into both eyes daily.     Marland Kitchen letrozole (FEMARA) 2.5 MG tablet Take 1 tablet (2.5 mg total) by mouth daily. (Patient not taking: Reported on 05/12/2017) 90 tablet 3 Not Taking at Unknown  time   Allergies  Allergen Reactions  . Shellfish Allergy Anaphylaxis and Other (See Comments)    Allergist said she was "highly allergic to shellfish"  . Sulfa Antibiotics Rash and Other (See Comments)    Looks like severe sunburn    Social History   Tobacco Use  . Smoking status: Never Smoker  . Smokeless tobacco: Never Used  Substance Use Topics  . Alcohol use: No    Family History  Problem Relation Age of Onset  . Hypertension Mother      Review of Systems  Constitutional: Negative.   HENT: Positive for sinus pain and tinnitus.   Eyes:  Positive for blurred vision.  Respiratory: Negative.   Cardiovascular: Negative.   Gastrointestinal: Negative.   Genitourinary: Negative.   Musculoskeletal: Positive for joint pain.  Skin: Negative.   Neurological: Negative.   Endo/Heme/Allergies: Negative.   Psychiatric/Behavioral: Positive for memory loss.    Objective:  Physical Exam  Constitutional: She is oriented to person, place, and time. She appears well-developed and well-nourished.  HENT:  Head: Normocephalic and atraumatic.  Eyes: Pupils are equal, round, and reactive to light.  Neck: Normal range of motion. Neck supple.  Cardiovascular: Intact distal pulses.  Respiratory: Effort normal.  Musculoskeletal:  The patient has a highly irritable left hip internal rotation to 5 causes severe pain and pelvic shift.  Foot tap is negative.  Skin is intact she is neurovascular intact.  Straight leg raising does not cause any pain.  Extension of the knee causes no pain  Neurological: She is alert and oriented to person, place, and time.  Skin: Skin is warm and dry.  Psychiatric: She has a normal mood and affect. Her behavior is normal. Judgment and thought content normal.    Vital signs in last 24 hours:    Labs:   Estimated body mass index is 20.91 kg/m as calculated from the following:   Height as of 05/16/17: 5\' 4"  (1.626 m).   Weight as of 05/16/17: 55.2 kg (121 lb 12.8 oz).   Imaging Review  MRI scan with super acetabular cysts and loss of articular cartilage.   Assessment/Plan:  End stage arthritis, left hip(s)  The patient history, physical examination, clinical judgement of the provider and imaging studies are consistent with end stage degenerative joint disease of the left hip(s) and total hip arthroplasty is deemed medically necessary. The treatment options including medical management, injection therapy, arthroscopy and arthroplasty were discussed at length. The risks and benefits of total hip arthroplasty  were presented and reviewed. The risks due to aseptic loosening, infection, stiffness, dislocation/subluxation,  thromboembolic complications and other imponderables were discussed.  The patient acknowledged the explanation, agreed to proceed with the plan and consent was signed. Patient is being admitted for inpatient treatment for surgery, pain control, PT, OT, prophylactic antibiotics, VTE prophylaxis, progressive ambulation and ADL's and discharge planning.The patient is planning to be discharged home with home health services.

## 2017-05-25 NOTE — Anesthesia Preprocedure Evaluation (Addendum)
Anesthesia Evaluation  Patient identified by MRN, date of birth, ID band Patient awake    Reviewed: Allergy & Precautions, NPO status , Patient's Chart, lab work & pertinent test results  Airway Mallampati: II  TM Distance: >3 FB Neck ROM: Full    Dental no notable dental hx.    Pulmonary neg pulmonary ROS,    Pulmonary exam normal        Cardiovascular negative cardio ROS   Rhythm:Regular Rate:Normal     Neuro/Psych  Headaches, negative neurological ROS  negative psych ROS   GI/Hepatic negative GI ROS, Neg liver ROS,   Endo/Other  negative endocrine ROSHypothyroidism   Renal/GU negative Renal ROS  negative genitourinary   Musculoskeletal negative musculoskeletal ROS (+) Arthritis ,   Abdominal   Peds  Hematology negative hematology ROS (+)   Anesthesia Other Findings   Reproductive/Obstetrics                            Lab Results  Component Value Date   WBC 4.6 05/16/2017   HGB 13.4 05/16/2017   HCT 41.2 05/16/2017   MCV 94.5 05/16/2017   PLT 388 05/16/2017   This SmartLink has not been configured with any valid records.    Lab Results  Component Value Date   CREATININE 0.81 05/16/2017   BUN 16 05/16/2017   NA 141 05/16/2017   K 3.8 05/16/2017   CL 104 05/16/2017   CO2 27 05/16/2017     Anesthesia Physical Anesthesia Plan  ASA: III  Anesthesia Plan: Spinal   Post-op Pain Management:    Induction:   PONV Risk Score and Plan: Treatment may vary due to age or medical condition and Ondansetron  Airway Management Planned: Natural Airway, Nasal Cannula and Mask  Additional Equipment:   Intra-op Plan:   Post-operative Plan:   Informed Consent:   Plan Discussed with:   Anesthesia Plan Comments:         Anesthesia Quick Evaluation

## 2017-05-26 ENCOUNTER — Inpatient Hospital Stay (HOSPITAL_COMMUNITY): Payer: 59 | Admitting: Anesthesiology

## 2017-05-26 ENCOUNTER — Encounter (HOSPITAL_COMMUNITY)
Admission: RE | Disposition: A | Discharge status: Home / Self Care | Payer: Self-pay | Source: Ambulatory Visit | Attending: Orthopedic Surgery

## 2017-05-26 ENCOUNTER — Inpatient Hospital Stay (HOSPITAL_COMMUNITY): Payer: 59

## 2017-05-26 ENCOUNTER — Inpatient Hospital Stay (HOSPITAL_COMMUNITY)
Admission: RE | Admit: 2017-05-26 | Discharge: 2017-05-28 | DRG: 470 | Disposition: A | Discharge status: Home / Self Care | Payer: 59 | Source: Ambulatory Visit | Attending: Orthopedic Surgery | Admitting: Orthopedic Surgery

## 2017-05-26 ENCOUNTER — Encounter (HOSPITAL_COMMUNITY): Payer: Self-pay

## 2017-05-26 ENCOUNTER — Other Ambulatory Visit: Payer: Self-pay

## 2017-05-26 DIAGNOSIS — Z471 Aftercare following joint replacement surgery: Secondary | ICD-10-CM | POA: Diagnosis not present

## 2017-05-26 DIAGNOSIS — C50512 Malignant neoplasm of lower-outer quadrant of left female breast: Secondary | ICD-10-CM | POA: Diagnosis not present

## 2017-05-26 DIAGNOSIS — Z79899 Other long term (current) drug therapy: Secondary | ICD-10-CM | POA: Diagnosis not present

## 2017-05-26 DIAGNOSIS — E039 Hypothyroidism, unspecified: Secondary | ICD-10-CM | POA: Diagnosis present

## 2017-05-26 DIAGNOSIS — Z9013 Acquired absence of bilateral breasts and nipples: Secondary | ICD-10-CM

## 2017-05-26 DIAGNOSIS — M25552 Pain in left hip: Secondary | ICD-10-CM | POA: Diagnosis not present

## 2017-05-26 DIAGNOSIS — Z96642 Presence of left artificial hip joint: Secondary | ICD-10-CM | POA: Diagnosis not present

## 2017-05-26 DIAGNOSIS — D62 Acute posthemorrhagic anemia: Secondary | ICD-10-CM | POA: Diagnosis not present

## 2017-05-26 DIAGNOSIS — G9050 Complex regional pain syndrome I, unspecified: Secondary | ICD-10-CM | POA: Diagnosis present

## 2017-05-26 DIAGNOSIS — Z419 Encounter for procedure for purposes other than remedying health state, unspecified: Secondary | ICD-10-CM

## 2017-05-26 DIAGNOSIS — Z853 Personal history of malignant neoplasm of breast: Secondary | ICD-10-CM

## 2017-05-26 DIAGNOSIS — Z9221 Personal history of antineoplastic chemotherapy: Secondary | ICD-10-CM | POA: Diagnosis not present

## 2017-05-26 DIAGNOSIS — Z79811 Long term (current) use of aromatase inhibitors: Secondary | ICD-10-CM | POA: Diagnosis not present

## 2017-05-26 DIAGNOSIS — E7800 Pure hypercholesterolemia, unspecified: Secondary | ICD-10-CM | POA: Diagnosis present

## 2017-05-26 DIAGNOSIS — M1612 Unilateral primary osteoarthritis, left hip: Principal | ICD-10-CM | POA: Diagnosis present

## 2017-05-26 HISTORY — DX: Unspecified osteoarthritis, unspecified site: M19.90

## 2017-05-26 HISTORY — PX: TOTAL HIP ARTHROPLASTY: SHX124

## 2017-05-26 SURGERY — ARTHROPLASTY, HIP, TOTAL, ANTERIOR APPROACH
Anesthesia: Spinal | Site: Hip | Laterality: Left

## 2017-05-26 MED ORDER — MENTHOL 3 MG MT LOZG
1.0000 | LOZENGE | OROMUCOSAL | Status: DC | PRN
Start: 2017-05-26 — End: 2017-05-28

## 2017-05-26 MED ORDER — FLEET ENEMA 7-19 GM/118ML RE ENEM: 1.0000 | ENEMA | Freq: Once | RECTAL | Status: DC | PRN

## 2017-05-26 MED ORDER — ONDANSETRON HCL 4 MG/2ML IJ SOLN
INTRAMUSCULAR | Status: DC | PRN
  Administered 2017-05-26: 4 mg via INTRAVENOUS

## 2017-05-26 MED ORDER — MEPERIDINE HCL 50 MG/ML IJ SOLN: 6.2500 mg | INTRAMUSCULAR | Status: DC | PRN

## 2017-05-26 MED ORDER — PREDNISOLONE ACETATE 1 % OP SUSP
1.0000 [drp] | Freq: Every day | OPHTHALMIC | Status: DC
  Administered 2017-05-27 – 2017-05-28 (×2): 1 [drp] via OPHTHALMIC
  Filled 2017-05-26: qty 1

## 2017-05-26 MED ORDER — HYDROCODONE-ACETAMINOPHEN 7.5-325 MG PO TABS: 1.0000 | ORAL_TABLET | Freq: Once | ORAL | Status: DC | PRN

## 2017-05-26 MED ORDER — ASPIRIN EC 325 MG PO TBEC: 325.0000 mg | DELAYED_RELEASE_TABLET | Freq: Two times a day (BID) | ORAL | 0 refills | Status: DC

## 2017-05-26 MED ORDER — METOCLOPRAMIDE HCL 5 MG PO TABS: 5.0000 mg | ORAL_TABLET | Freq: Three times a day (TID) | ORAL | Status: DC | PRN

## 2017-05-26 MED ORDER — PHENYLEPHRINE HCL 10 MG PO TABS
5.0000 mg | ORAL_TABLET | Freq: Three times a day (TID) | ORAL | Status: DC | PRN
  Filled 2017-05-26: qty 1

## 2017-05-26 MED ORDER — FENTANYL CITRATE (PF) 250 MCG/5ML IJ SOLN
INTRAMUSCULAR | Status: AC
  Filled 2017-05-26: qty 5

## 2017-05-26 MED ORDER — ONDANSETRON HCL 4 MG PO TABS: 4.0000 mg | ORAL_TABLET | Freq: Four times a day (QID) | ORAL | Status: DC | PRN

## 2017-05-26 MED ORDER — METOCLOPRAMIDE HCL 5 MG/ML IJ SOLN
5.0000 mg | Freq: Three times a day (TID) | INTRAMUSCULAR | Status: DC | PRN
  Administered 2017-05-27: 10 mg via INTRAVENOUS
  Filled 2017-05-26: qty 2

## 2017-05-26 MED ORDER — ACETAMINOPHEN 10 MG/ML IV SOLN: 1000.0000 mg | Freq: Once | INTRAVENOUS | Status: DC | PRN

## 2017-05-26 MED ORDER — GABAPENTIN 300 MG PO CAPS
300.0000 mg | ORAL_CAPSULE | Freq: Three times a day (TID) | ORAL | Status: DC
  Administered 2017-05-26 – 2017-05-28 (×6): 300 mg via ORAL
  Filled 2017-05-26 (×6): qty 1

## 2017-05-26 MED ORDER — TRANEXAMIC ACID 1000 MG/10ML IV SOLN
1000.0000 mg | INTRAVENOUS | Status: AC
  Administered 2017-05-26: 1000 mg via INTRAVENOUS
  Filled 2017-05-26: qty 1100

## 2017-05-26 MED ORDER — DIPHENHYDRAMINE HCL 12.5 MG/5ML PO ELIX: 12.5000 mg | ORAL_SOLUTION | ORAL | Status: DC | PRN

## 2017-05-26 MED ORDER — ALUMINUM HYDROXIDE GEL 320 MG/5ML PO SUSP
15.0000 mL | ORAL | Status: DC | PRN
  Filled 2017-05-26: qty 30

## 2017-05-26 MED ORDER — ONDANSETRON HCL 4 MG/2ML IJ SOLN
INTRAMUSCULAR | Status: AC
  Filled 2017-05-26: qty 2

## 2017-05-26 MED ORDER — BISACODYL 5 MG PO TBEC: 5.0000 mg | DELAYED_RELEASE_TABLET | Freq: Every day | ORAL | Status: DC | PRN

## 2017-05-26 MED ORDER — TRANEXAMIC ACID 1000 MG/10ML IV SOLN
1000.0000 mg | Freq: Once | INTRAVENOUS | Status: AC
  Administered 2017-05-26: 1000 mg via INTRAVENOUS
  Filled 2017-05-26: qty 10

## 2017-05-26 MED ORDER — MIDAZOLAM HCL 5 MG/5ML IJ SOLN
INTRAMUSCULAR | Status: DC | PRN
  Administered 2017-05-26: 2 mg via INTRAVENOUS

## 2017-05-26 MED ORDER — KCL IN DEXTROSE-NACL 20-5-0.45 MEQ/L-%-% IV SOLN
INTRAVENOUS | Status: DC
  Administered 2017-05-26 (×2): via INTRAVENOUS
  Filled 2017-05-26 (×3): qty 1000

## 2017-05-26 MED ORDER — PHENYLEPHRINE-APAP-GUAIFENESIN 5-325-200 MG PO TABS: 1.0000 | ORAL_TABLET | Freq: Three times a day (TID) | ORAL | Status: DC | PRN

## 2017-05-26 MED ORDER — BUPIVACAINE IN DEXTROSE 0.75-8.25 % IT SOLN
INTRATHECAL | Status: DC | PRN
  Administered 2017-05-26: 1.8 mL via INTRATHECAL

## 2017-05-26 MED ORDER — OXYCODONE-ACETAMINOPHEN 5-325 MG PO TABS: 1.0000 | ORAL_TABLET | ORAL | 0 refills | Status: DC | PRN

## 2017-05-26 MED ORDER — FENTANYL CITRATE (PF) 100 MCG/2ML IJ SOLN
INTRAMUSCULAR | Status: DC | PRN
  Administered 2017-05-26: 50 ug via INTRAVENOUS

## 2017-05-26 MED ORDER — METHOCARBAMOL 500 MG PO TABS
500.0000 mg | ORAL_TABLET | Freq: Four times a day (QID) | ORAL | Status: DC | PRN
  Administered 2017-05-26 – 2017-05-27 (×3): 500 mg via ORAL
  Filled 2017-05-26 (×3): qty 1

## 2017-05-26 MED ORDER — HYDROMORPHONE HCL 1 MG/ML IJ SOLN
0.2500 mg | INTRAMUSCULAR | Status: DC | PRN
  Administered 2017-05-26 (×2): 0.5 mg via INTRAVENOUS

## 2017-05-26 MED ORDER — OXYCODONE HCL 5 MG PO TABS
10.0000 mg | ORAL_TABLET | ORAL | Status: DC | PRN
  Administered 2017-05-26 (×2): 10 mg via ORAL
  Filled 2017-05-26 (×3): qty 2

## 2017-05-26 MED ORDER — ASPIRIN EC 325 MG PO TBEC
325.0000 mg | DELAYED_RELEASE_TABLET | Freq: Every day | ORAL | Status: DC
  Administered 2017-05-27 – 2017-05-28 (×2): 325 mg via ORAL
  Filled 2017-05-26 (×2): qty 1

## 2017-05-26 MED ORDER — HYDROMORPHONE HCL 1 MG/ML IJ SOLN
0.5000 mg | INTRAMUSCULAR | Status: DC | PRN
  Administered 2017-05-26 (×3): 0.5 mg via INTRAVENOUS
  Filled 2017-05-26 (×3): qty 1

## 2017-05-26 MED ORDER — POLYETHYLENE GLYCOL 3350 17 G PO PACK: 17.0000 g | PACK | Freq: Every day | ORAL | Status: DC | PRN

## 2017-05-26 MED ORDER — DEXAMETHASONE SODIUM PHOSPHATE 10 MG/ML IJ SOLN
10.0000 mg | Freq: Once | INTRAMUSCULAR | Status: AC
  Administered 2017-05-27: 10 mg via INTRAVENOUS
  Filled 2017-05-26: qty 1

## 2017-05-26 MED ORDER — BUPIVACAINE-EPINEPHRINE (PF) 0.5% -1:200000 IJ SOLN
INTRAMUSCULAR | Status: AC
  Filled 2017-05-26: qty 60

## 2017-05-26 MED ORDER — ACETAMINOPHEN 650 MG RE SUPP: 650.0000 mg | RECTAL | Status: DC | PRN

## 2017-05-26 MED ORDER — GUAIFENESIN 200 MG PO TABS
200.0000 mg | ORAL_TABLET | Freq: Three times a day (TID) | ORAL | Status: DC | PRN
  Filled 2017-05-26: qty 1

## 2017-05-26 MED ORDER — CHLORHEXIDINE GLUCONATE 4 % EX LIQD: 60.0000 mL | Freq: Once | CUTANEOUS | Status: DC

## 2017-05-26 MED ORDER — LEVOTHYROXINE SODIUM 25 MCG PO TABS
25.0000 ug | ORAL_TABLET | Freq: Every day | ORAL | Status: DC
  Administered 2017-05-27 – 2017-05-28 (×2): 25 ug via ORAL
  Filled 2017-05-26 (×2): qty 1

## 2017-05-26 MED ORDER — PROPOFOL 10 MG/ML IV BOLUS
INTRAVENOUS | Status: AC
  Filled 2017-05-26: qty 20

## 2017-05-26 MED ORDER — HYDROMORPHONE HCL 1 MG/ML IJ SOLN
INTRAMUSCULAR | Status: AC
  Filled 2017-05-26: qty 1

## 2017-05-26 MED ORDER — PROPOFOL 1000 MG/100ML IV EMUL
INTRAVENOUS | Status: AC
  Filled 2017-05-26: qty 100

## 2017-05-26 MED ORDER — ONDANSETRON HCL 4 MG/2ML IJ SOLN
4.0000 mg | Freq: Four times a day (QID) | INTRAMUSCULAR | Status: DC | PRN
  Administered 2017-05-26 – 2017-05-27 (×3): 4 mg via INTRAVENOUS
  Filled 2017-05-26 (×3): qty 2

## 2017-05-26 MED ORDER — BUPIVACAINE-EPINEPHRINE (PF) 0.5% -1:200000 IJ SOLN
INTRAMUSCULAR | Status: DC | PRN
  Administered 2017-05-26: 20 mL
  Administered 2017-05-26: 30 mL

## 2017-05-26 MED ORDER — DOCUSATE SODIUM 100 MG PO CAPS
100.0000 mg | ORAL_CAPSULE | Freq: Two times a day (BID) | ORAL | Status: DC
  Administered 2017-05-26 – 2017-05-28 (×4): 100 mg via ORAL
  Filled 2017-05-26 (×4): qty 1

## 2017-05-26 MED ORDER — BUPIVACAINE LIPOSOME 1.3 % IJ SUSP
20.0000 mL | Freq: Once | INTRAMUSCULAR | Status: AC
Start: 2017-05-26 — End: 2017-05-26
  Administered 2017-05-26: 20 mL
  Filled 2017-05-26: qty 20

## 2017-05-26 MED ORDER — DEXTROSE 5 % IV SOLN: 500.0000 mg | Freq: Four times a day (QID) | INTRAVENOUS | Status: DC | PRN

## 2017-05-26 MED ORDER — 0.9 % SODIUM CHLORIDE (POUR BTL) OPTIME
TOPICAL | Status: DC | PRN
  Administered 2017-05-26: 1000 mL

## 2017-05-26 MED ORDER — PROMETHAZINE HCL 25 MG/ML IJ SOLN: 6.2500 mg | INTRAMUSCULAR | Status: DC | PRN

## 2017-05-26 MED ORDER — PROPOFOL 500 MG/50ML IV EMUL
INTRAVENOUS | Status: DC | PRN
  Administered 2017-05-26: 50 ug/kg/min via INTRAVENOUS

## 2017-05-26 MED ORDER — OXYCODONE HCL 5 MG PO TABS
5.0000 mg | ORAL_TABLET | ORAL | Status: DC | PRN
  Administered 2017-05-27: 5 mg via ORAL

## 2017-05-26 MED ORDER — ACETAMINOPHEN 325 MG PO TABS
650.0000 mg | ORAL_TABLET | ORAL | Status: DC | PRN
Start: 2017-05-26 — End: 2017-05-28
  Administered 2017-05-27 – 2017-05-28 (×2): 650 mg via ORAL
  Filled 2017-05-26 (×2): qty 2

## 2017-05-26 MED ORDER — PHENOL 1.4 % MT LIQD: 1.0000 | OROMUCOSAL | Status: DC | PRN

## 2017-05-26 MED ORDER — TIZANIDINE HCL 2 MG PO TABS: 2.0000 mg | ORAL_TABLET | Freq: Four times a day (QID) | ORAL | 0 refills | Status: DC | PRN

## 2017-05-26 MED ORDER — MIDAZOLAM HCL 2 MG/2ML IJ SOLN
INTRAMUSCULAR | Status: AC
  Filled 2017-05-26: qty 2

## 2017-05-26 MED ORDER — AMOXICILLIN 500 MG PO CAPS
500.0000 mg | ORAL_CAPSULE | Freq: Four times a day (QID) | ORAL | Status: DC
  Administered 2017-05-26 – 2017-05-28 (×7): 500 mg via ORAL
  Filled 2017-05-26 (×9): qty 1

## 2017-05-26 SURGICAL SUPPLY — 44 items
BAG DECANTER FOR FLEXI CONT (MISCELLANEOUS) ×2 IMPLANT
BLADE SAW SGTL 18X1.27X75 (BLADE) ×2 IMPLANT
CAPT HIP TOTAL 2 ×2 IMPLANT
COVER PERINEAL POST (MISCELLANEOUS) ×2 IMPLANT
COVER SURGICAL LIGHT HANDLE (MISCELLANEOUS) ×2 IMPLANT
DRAPE C-ARM 42X72 X-RAY (DRAPES) ×2 IMPLANT
DRAPE STERI IOBAN 125X83 (DRAPES) ×2 IMPLANT
DRAPE U-SHAPE 47X51 STRL (DRAPES) ×4 IMPLANT
DRSG AQUACEL AG ADV 3.5X10 (GAUZE/BANDAGES/DRESSINGS) ×2 IMPLANT
DURAPREP 26ML APPLICATOR (WOUND CARE) ×2 IMPLANT
ELECT BLADE 4.0 EZ CLEAN MEGAD (MISCELLANEOUS) ×2
ELECT REM PT RETURN 9FT ADLT (ELECTROSURGICAL) ×2
ELECTRODE BLDE 4.0 EZ CLN MEGD (MISCELLANEOUS) ×1 IMPLANT
ELECTRODE REM PT RTRN 9FT ADLT (ELECTROSURGICAL) ×1 IMPLANT
FACESHIELD WRAPAROUND (MASK) ×4 IMPLANT
GLOVE BIO SURGEON STRL SZ7.5 (GLOVE) ×2 IMPLANT
GLOVE BIO SURGEON STRL SZ8.5 (GLOVE) ×2 IMPLANT
GLOVE BIOGEL PI IND STRL 8 (GLOVE) ×1 IMPLANT
GLOVE BIOGEL PI IND STRL 9 (GLOVE) ×1 IMPLANT
GLOVE BIOGEL PI INDICATOR 8 (GLOVE) ×1
GLOVE BIOGEL PI INDICATOR 9 (GLOVE) ×1
GOWN STRL REUS W/ TWL LRG LVL3 (GOWN DISPOSABLE) ×1 IMPLANT
GOWN STRL REUS W/ TWL XL LVL3 (GOWN DISPOSABLE) ×2 IMPLANT
GOWN STRL REUS W/TWL LRG LVL3 (GOWN DISPOSABLE) ×1
GOWN STRL REUS W/TWL XL LVL3 (GOWN DISPOSABLE) ×2
KIT BASIN OR (CUSTOM PROCEDURE TRAY) ×2 IMPLANT
KIT ROOM TURNOVER OR (KITS) ×2 IMPLANT
MANIFOLD NEPTUNE II (INSTRUMENTS) ×2 IMPLANT
NEEDLE HYPO 22GX1.5 SAFETY (NEEDLE) ×4 IMPLANT
NS IRRIG 1000ML POUR BTL (IV SOLUTION) ×2 IMPLANT
PACK TOTAL JOINT (CUSTOM PROCEDURE TRAY) ×2 IMPLANT
PAD ARMBOARD 7.5X6 YLW CONV (MISCELLANEOUS) ×4 IMPLANT
SUT ETHIBOND NAB CT1 #1 30IN (SUTURE) ×2 IMPLANT
SUT VIC AB 0 CT1 27 (SUTURE)
SUT VIC AB 0 CT1 27XBRD ANBCTR (SUTURE) IMPLANT
SUT VIC AB 1 CTX 36 (SUTURE) ×1
SUT VIC AB 1 CTX36XBRD ANBCTR (SUTURE) ×1 IMPLANT
SUT VIC AB 2-0 CT1 27 (SUTURE) ×1
SUT VIC AB 2-0 CT1 TAPERPNT 27 (SUTURE) ×1 IMPLANT
SUT VIC AB 3-0 CT1 27 (SUTURE) ×1
SUT VIC AB 3-0 CT1 TAPERPNT 27 (SUTURE) ×1 IMPLANT
SYR CONTROL 10ML LL (SYRINGE) ×4 IMPLANT
TOWEL OR 17X26 10 PK STRL BLUE (TOWEL DISPOSABLE) ×2 IMPLANT
TRAY CATH 16FR W/PLASTIC CATH (SET/KITS/TRAYS/PACK) IMPLANT

## 2017-05-26 NOTE — Evaluation (Signed)
Physical Therapy Evaluation Patient Details Name: Elizabeth [name] MRN: [id number] DOB: 1955/11/03 Today's Date: 05/26/2017   History of Present Illness  Pt is a 62 y/o female s/p elective L THA, anterior approach. PMH inlucdes L breast cancer s/p bilat mastectomy, and R knee surgery.   Clinical Impression  Pt s/p surgery above with deficits below. Pt limited in mobility tolerance secondary to nausea/vomiting. RN in room and gave meds. Requiring min guard to perform stand pivot with RW. Anticipate pt will progress well once nausea controlled. Will continue to follow acutely to maximize functional mobility independence and safety.     Follow Up Recommendations DC plan and follow up therapy as arranged by surgeon;Supervision for mobility/OOB    Equipment Recommendations  None recommended by PT    Recommendations for Other Services       Precautions / Restrictions Precautions Precautions: Anterior Hip Precaution Booklet Issued: Yes (comment) Precaution Comments: Reviewed precautions and verbally reviewed THA with pt's daughter and pt, as pt with nausea/vomiting after mobility.  Restrictions Weight Bearing Restrictions: Yes LLE Weight Bearing: Weight bearing as tolerated      Mobility  Bed Mobility Overal bed mobility: Needs Assistance Bed Mobility: Supine to Sit     Supine to sit: Min assist     General bed mobility comments: Min A for LLE assist. Increased time to perform secondary to pain.   Transfers Overall transfer level: Needs assistance Equipment used: Rolling walker (2 wheeled) Transfers: Sit to/from [facility] Sit to Stand: Min assist Stand pivot transfers: Min guard       General transfer comment: Min A for lift assist and steadying upon standing. Stand pivot X 2 to [facility] and then to recliner with min guard assist. Verbal cues for sequencing during mobility. Pt with nausea/vomiting so further mobility deferred.   Ambulation/Gait              General Gait Details: Deferred secondary to nausea/vomiting.   Stairs            Wheelchair Mobility    Modified Rankin (Stroke Patients Only)       Balance Overall balance assessment: Needs assistance Sitting-balance support: No upper extremity supported;Feet supported Sitting balance-Leahy Scale: Good     Standing balance support: Bilateral upper extremity supported;During functional activity Standing balance-Leahy Scale: Poor Standing balance comment: Reliant on BUE support.                              Pertinent Vitals/Pain Pain Assessment: 0-10 Pain Score: 8  Pain Location: L hip  Pain Descriptors / Indicators: Aching;Operative site guarding Pain Intervention(s): Limited activity within patient's tolerance;Monitored during session;Repositioned    Home Living Family/patient expects to be discharged to:: Private residence Living Arrangements: Spouse/significant other;Children Available Help at Discharge: Family;Available 24 hours/day Type of Home: House Home Access: Stairs to enter Entrance Stairs-Rails: Right(in the back, no rail in the front) Entrance Stairs-Number of Steps: 3  Home Layout: Two level;Able to live on main level with bedroom/bathroom Home Equipment: Gilford Rile - 2 wheels;Bedside commode;Crutches      Prior Function Level of Independence: Independent with assistive device(s)         Comments: Reports use of crutches on really bad days, however, was independent majority of the time.      Hand Dominance        Extremity/Trunk Assessment   Upper Extremity Assessment Upper Extremity Assessment: Defer to OT evaluation  Lower Extremity Assessment Lower Extremity Assessment: LLE deficits/detail LLE Deficits / Details: Sensory in tact. Deficits consistent with post op pain and weakness.     Cervical / Trunk Assessment Cervical / Trunk Assessment: Normal  Communication   Communication: No difficulties  Cognition  Arousal/Alertness: Awake/alert Behavior During Therapy: WFL for tasks assessed/performed Overall Cognitive Status: Within Functional Limits for tasks assessed                                        General Comments General comments (skin integrity, edema, etc.): Pt's daughter present during session.     Exercises Total Joint Exercises Ankle Circles/Pumps: AROM;Both;20 reps Quad Sets: (verbally reviewed ) Heel Slides: (Verbally reviewed )   Assessment/Plan    PT Assessment Patient needs continued PT services  PT Problem List Decreased strength;Decreased range of motion;Decreased activity tolerance;Decreased mobility;Decreased balance;Decreased knowledge of use of DME;Decreased knowledge of precautions;Pain       PT Treatment Interventions DME instruction;Gait training;Functional mobility training;Stair training;Therapeutic activities;Therapeutic exercise;Balance training;Neuromuscular re-education;Patient/family education    PT Goals (Current goals can be found in the Care Plan section)  Acute Rehab PT Goals Patient Stated Goal: to feel better  PT Goal Formulation: With patient Time For Goal Achievement: 06/02/17 Potential to Achieve Goals: Good    Frequency 7X/week   Barriers to discharge        Co-evaluation               AM-PAC PT "6 Clicks" Daily Activity  Outcome Measure Difficulty turning over in bed (including adjusting bedclothes, sheets and blankets)?: A Little Difficulty moving from lying on back to sitting on the side of the bed? : Unable Difficulty sitting down on and standing up from a chair with arms (e.g., wheelchair, bedside commode, etc,.)?: Unable Help needed moving to and from a bed to chair (including a wheelchair)?: A Little Help needed walking in hospital room?: A Little Help needed climbing 3-5 steps with a railing? : A Lot 6 Click Score: 13    End of Session Equipment Utilized During Treatment: Gait belt Activity  Tolerance: Treatment limited secondary to medical complications (Comment)(nausea/vomiting ) Patient left: in chair;with call bell/phone within reach;with family/visitor present Nurse Communication: Mobility status PT Visit Diagnosis: Other abnormalities of gait and mobility (R26.89);Pain Pain - Right/Left: Left Pain - part of body: Hip    Time: 1227-1259 PT Time Calculation (min) (ACUTE ONLY): 32 min   Charges:   PT Evaluation $PT Eval Low Complexity: 1 Low PT Treatments $Therapeutic Activity: 8-22 mins   PT G Codes:        Leighton Ruff, PT, DPT  Acute Rehabilitation Services  Pager: [phone]   Rudean Hitt 05/26/2017, 1:25 PM

## 2017-05-26 NOTE — Anesthesia Postprocedure Evaluation (Signed)
Anesthesia Post Note  Patient: Elizabeth [name]  Procedure(s) Performed: TOTAL HIP ARTHROPLASTY ANTERIOR APPROACH (Left Hip)     Patient location during evaluation: PACU Anesthesia Type: Spinal Level of consciousness: oriented and awake and alert Pain management: pain level controlled Vital Signs Assessment: post-procedure vital signs reviewed and stable Respiratory status: spontaneous breathing, respiratory function stable and patient connected to nasal cannula oxygen Cardiovascular status: blood pressure returned to baseline and stable Postop Assessment: no headache, no backache and no apparent nausea or vomiting Anesthetic complications: no    Last Vitals:  Vitals:   05/26/17 1000 05/26/17 1047  BP: 124/72 124/76  Pulse: 72 73  Resp: 11 16  Temp:  36.5 C  SpO2: 100% 100%    Last Pain:  Vitals:   05/26/17 1047  TempSrc: Oral  PainSc:                  Barnet Glasgow

## 2017-05-26 NOTE — Plan of Care (Signed)
  Progressing Education: Knowledge of General Education information will improve 05/26/2017 1558 - Progressing by Rance Muir, RN Health Behavior/Discharge Planning: Ability to manage health-related needs will improve 05/26/2017 1558 - Progressing by Rance Muir, RN Clinical Measurements: Ability to maintain clinical measurements within normal limits will improve 05/26/2017 1558 - Progressing by Rance Muir, RN Will remain free from infection 05/26/2017 1558 - Progressing by Rance Muir, RN Diagnostic test results will improve 05/26/2017 1558 - Progressing by Rance Muir, RN Respiratory complications will improve 05/26/2017 1558 - Progressing by Rance Muir, RN Cardiovascular complication will be avoided 05/26/2017 1558 - Progressing by Rance Muir, RN Activity: Risk for activity intolerance will decrease 05/26/2017 1558 - Progressing by Rance Muir, RN Nutrition: Adequate nutrition will be maintained 05/26/2017 1558 - Progressing by Rance Muir, RN Coping: Level of anxiety will decrease 05/26/2017 1558 - Progressing by Rance Muir, RN Elimination: Will not experience complications related to bowel motility 05/26/2017 1558 - Progressing by Rance Muir, RN Will not experience complications related to urinary retention 05/26/2017 1558 - Progressing by Rance Muir, RN Pain Managment: General experience of comfort will improve 05/26/2017 1558 - Progressing by Rance Muir, RN Safety: Ability to remain free from injury will improve 05/26/2017 1558 - Progressing by Rance Muir, RN Skin Integrity: Risk for impaired skin integrity will decrease 05/26/2017 1558 - Progressing by Rance Muir, RN

## 2017-05-26 NOTE — Op Note (Signed)
OPERATIVE REPORT    DATE OF PROCEDURE:  05/26/2017       PREOPERATIVE DIAGNOSIS:  LEFT HIP OSTEOARTHRITIS                                                          POSTOPERATIVE DIAGNOSIS:  LEFT HIP OSTEOARTHRITIS                                                           PROCEDURE: Anterior L total hip arthroplasty using a 48 mm DePuy Pinnacle  Cup, [employer], 0-degree polyethylene liner, a +1.5 32 mm ceramic head, a Freight forwarder stem   SURGEON: Kerin Salen    ASSISTANT:   Kerry Hough. [employer]  (present throughout entire procedure and necessary for timely completion of the procedure)   ANESTHESIA: Spinal BLOOD LOSS: 300cc FLUID REPLACEMENT: 1400 crystalloid Antibiotic: 2gm Ancef Tranexamic Acid: 1gm IV, 2gm Topical COMPLICATIONS: none    INDICATIONS FOR PROCEDURE: A 62 y.o. year-old With  LEFT HIP OSTEOARTHRITIS   for 3 years, x-rays show bone-on-bone arthritic changes, and osteophytes. Despite conservative measures with observation, anti-inflammatory medicine, narcotics, use of a cane, has severe unremitting pain and can ambulate only a few blocks before resting. Patient desires elective L total hip arthroplasty to decrease pain and increase function. The risks, benefits, and alternatives were discussed at length including but not limited to the risks of infection, bleeding, nerve injury, stiffness, blood clots, the need for revision surgery, cardiopulmonary complications, among others, and they were willing to proceed. Questions answered     PROCEDURE IN DETAIL: The patient was identified by armband,  received preoperative IV antibiotics in the holding area at [facility], taken to the operating room , appropriate anesthetic monitors  were attached and  anesthesia was induced with the patienton the gurney. The HANA boots were applied to the feet and he was then transferred to the HANA table with a peroneal post and support underneath the non-operative le,  which was locked in 5 lb traction. Theoperative lower extremity was then prepped and draped in the usual sterile fashion from just above the iliac crest to the knee. And a timeout procedure was performed. We then made a 12 cm incision along the interval at the leading edge of the tensor fascia lata of starting at 2 cm lateral to and 2 cm distal to the ASIS. Small bleeders in the skin and subcutaneous tissue identified and cauterized we dissected down to the fascia and made an incision in the fascia allowing [place] to elevate the fascia of the tensor muscle and exploited the interval between the rectus and the tensor fascia lata. A Hohmann retractor was then placed along the superior neck of the femur and a Cobra retractor along the inferior neck of the femur we teed the capsule starting out at the superior anterior aspect of the acetabulum going distally and made the T along the neck both leaflets of the T were tagged with #2 Ethibond suture. Cobra retractors were then placed along the inferior and superior neck allowing [place] to perform a standard neck cut and  removed the femoral head with a power corkscrew. We then placed a right angle Hohmann retractor along the anterior aspect of the acetabulum a spiked Cobra in the cotyloid notch and posteriorly a Muelller retractor. We then sequentially reamed up to a 48 mm basket reamer obtaining good coverage in all quadrants, verified by C-arm imaging. Under C-arm control with and hammered into place a 48 mm Pinnacle cup in 45 of abduction and 15 of anteversion. The cup seated nicely and required no supplemental screws. We then placed a central hole Eliminator and a 0 polyethylene liner. The foot was then externally rotated to 110, the HANA elevator was placed around the flare of the greater trochanter and the limb was extended and abducted delivering the proximal femur up into the wound. A medium Hohmann retractor was placed over the greater trochanter and a Mueller retractor  along the posterior femoral neck completing the exposure. We then performed releases superiorly and and inferiorly of the capsule going back to the pirformis fossa superiorly and to the lesser trochanter inferiorly. We then entered the proximal femur with the box cutting offset chisel followed by, a canal sounder, the chili pepper and broaching up to a 2 hi broach. This seated nicely and we reamed the calcar. A trial reduction was performed with a 1.5 mm 32 mm head.The limb lengths were excellent the hip was stable in 90 of external rotation. At this point the trial components removed and we hammered into place a # 2 hi  Offset Tri-Lock stem with Gryption coating. A + 1.5 32 mm ceramic ball was then hammered into place the hip was reduced and final C-arm images obtained. The wound was thoroughly irrigated with normal saline solution. We repaired the ant capsule and the tensor fascia lot a with running 0 vicryl suture. the subcutaneous tissue was closed with 2-0 and 3-0 Vicryl suture followed by an Aquacil dressing. At this point the patient was awaken and transferred to hospital gurney without difficulty. The subcutaneous tissue with 0 and 2-0 undyed Vicryl suture and the skin with running  3-0 vicryl subcuticular suture. Aquacil dressing was applied. The patient was then unclamped, rolled supine, awaken extubated and taken to recovery room without difficulty in stable condition.   Kerin Salen 05/26/2017, 8:28 AM

## 2017-05-26 NOTE — Transfer of Care (Signed)
Immediate Anesthesia Transfer of Care Note  Patient: Elizabeth [name]  Procedure(s) Performed: TOTAL HIP ARTHROPLASTY ANTERIOR APPROACH (Left Hip)  Patient Location: PACU  Anesthesia Type:MAC and Spinal  Level of Consciousness: awake, alert  and oriented  Airway & Oxygen Therapy: Patient Spontanous Breathing and Patient connected to face mask oxygen  Post-op Assessment: Report given to RN and Post -op Vital signs reviewed and stable  Post vital signs: Reviewed and stable  Last Vitals:  Vitals:   05/26/17 0541  BP: (!) 158/95  Pulse: 94  Resp: 18  Temp: 37.2 C  SpO2: 100%    Last Pain:  Vitals:   05/26/17 0602  TempSrc:   PainSc: 0-No pain      Patients Stated Pain Goal: 5 (70/48/88 9169)  Complications: No apparent anesthesia complications

## 2017-05-26 NOTE — Interval H&P Note (Signed)
History and Physical Interval Note:  05/26/2017 7:12 AM  Elizabeth [name]  has presented today for surgery, with the diagnosis of LEFT HIP OSTEOARTHRITIS  The various methods of treatment have been discussed with the patient and family. After consideration of risks, benefits and other options for treatment, the patient has consented to  Procedure(s): TOTAL HIP ARTHROPLASTY ANTERIOR APPROACH (Left) as a surgical intervention .  The patient's history has been reviewed, patient examined, no change in status, stable for surgery.  I have reviewed the patient's chart and labs.  Questions were answered to the patient's satisfaction.     Kerin Salen

## 2017-05-26 NOTE — Anesthesia Procedure Notes (Signed)
Spinal  Patient location during procedure: OR Start time: 05/26/2017 7:25 AM End time: 05/26/2017 7:31 AM Staffing Anesthesiologist: Barnet Glasgow, MD Performed: anesthesiologist  Preanesthetic Checklist Completed: patient identified, surgical consent, pre-op evaluation, timeout performed, IV checked, risks and benefits discussed and monitors and equipment checked Spinal Block Patient position: sitting Prep: site prepped and draped and DuraPrep Patient monitoring: heart rate, cardiac monitor, continuous pulse ox and blood pressure Approach: midline Location: L3-4 Injection technique: single-shot Needle Needle type: Pencan  Needle gauge: 24 G Needle length: 10 cm Assessment Sensory level: T4

## 2017-05-26 NOTE — Discharge Instructions (Signed)

## 2017-05-27 ENCOUNTER — Other Ambulatory Visit: Payer: Self-pay

## 2017-05-27 ENCOUNTER — Encounter (HOSPITAL_COMMUNITY): Payer: Self-pay | Admitting: Orthopedic Surgery

## 2017-05-27 LAB — BASIC METABOLIC PANEL
Anion gap: 9 (ref 5–15)
BUN: 6 mg/dL (ref 6–20)
CALCIUM: 8.1 mg/dL — AB (ref 8.9–10.3)
CO2: 23 mmol/L (ref 22–32)
CREATININE: 0.74 mg/dL (ref 0.44–1.00)
Chloride: 101 mmol/L (ref 101–111)
Glucose, Bld: 261 mg/dL — ABNORMAL HIGH (ref 65–99)
Potassium: 4.7 mmol/L (ref 3.5–5.1)
SODIUM: 133 mmol/L — AB (ref 135–145)

## 2017-05-27 LAB — CBC
HCT: 29.7 % — ABNORMAL LOW (ref 36.0–46.0)
HEMOGLOBIN: 9.7 g/dL — AB (ref 12.0–15.0)
MCH: 31.8 pg (ref 26.0–34.0)
MCHC: 32.7 g/dL (ref 30.0–36.0)
MCV: 97.4 fL (ref 78.0–100.0)
PLATELETS: 247 10*3/uL (ref 150–400)
RBC: 3.05 MIL/uL — ABNORMAL LOW (ref 3.87–5.11)
RDW: 13.4 % (ref 11.5–15.5)
WBC: 6.9 10*3/uL (ref 4.0–10.5)

## 2017-05-27 NOTE — Progress Notes (Signed)
PATIENT ID: Elizabeth [name]  MRN: [id number]  DOB/AGE:  09/21/1955 / 62 y.o.  1 Day Post-Op Procedure(s) (LRB): TOTAL HIP ARTHROPLASTY ANTERIOR APPROACH (Left)    PROGRESS NOTE Subjective: Patient is alert, oriented, x2 Nausea, x2 Vomiting, yes passing gas, . Taking PO well,Nausea is related to the use of Percocet. Denies SOB, Chest or Calf Pain. Using Incentive Spirometer, PAS in place. Ambulate Has been up to bathroom and room Patient reports pain as  4/10  .    Objective: Vital signs in last 24 hours: Vitals:   05/26/17 1300 05/26/17 2003 05/26/17 2356 05/27/17 0424  BP: 118/66 105/60 118/65 125/60  Pulse: 77 80 89 100  Resp: 18 16 16 16   Temp: 98.3 F (36.8 C) 98.6 F (37 C) 98.6 F (37 C) 98.5 F (36.9 C)  TempSrc: Oral Oral Oral Oral  SpO2: 98% 95% 92% 95%  Weight:      Height:          Intake/Output from previous day: I/O last 3 completed shifts: In: 1640 [P.O.:240; I.V.:1400] Out: 300 [Urine:100; Blood:200]   Intake/Output this shift: No intake/output data recorded.   LABORATORY DATA: Recent Labs    05/27/17 0622  WBC 6.9  HGB 9.7*  HCT 29.7*  PLT 247    Examination: Neurologically intact ABD soft Neurovascular intact Sensation intact distally Intact pulses distally Dorsiflexion/Plantar flexion intact Incision: scant drainage No cellulitis present Compartment soft} XR AP&Lat of hip shows well placed\fixed THA  Assessment:   1 Day Post-Op Procedure(s) (LRB): TOTAL HIP ARTHROPLASTY ANTERIOR APPROACH (Left) ADDITIONAL DIAGNOSIS:  Expected Acute Blood Loss Anemia, History of breast cancer  Plan: PT/OT WBAT, THA  DVT Prophylaxis: SCDx72 hrs, ASA 325 mg BID x 2 weeks  DISCHARGE PLAN: Home, When passed his physical therapy, probably tomorrow  DISCHARGE NEEDS: HHPT, Walker and 3-in-1 comode seat,Home health PT will be done by family member Estill Bamberg Gosch who is a physical therapist

## 2017-05-27 NOTE — Progress Notes (Signed)
Physical Therapy Treatment Patient Details Name: Elizabeth [name] MRN: [id number] DOB: 03/21/1956 Today's Date: 05/27/2017    History of Present Illness Pt is a 62 y/o female s/p elective L THA, anterior approach. PMH inlucdes L breast cancer s/p bilat mastectomy, and R knee surgery.     PT Comments    Pt still very lethargic but more arousable and kept eyes open with ambulation and was able to converse through session. Ambulated 200' with RW and min-guard A. Plan to practice steps in AM before d/c home. PT will continue to follow.    Follow Up Recommendations  Follow surgeon's recommendation for DC plan and follow-up therapies     Equipment Recommendations  None recommended by PT    Recommendations for Other Services       Precautions / Restrictions Precautions Precautions: Anterior Hip Precaution Booklet Issued: No Precaution Comments: reviewed precautions with pt and husband. Pt could not remember any of them due to lethargy Restrictions Weight Bearing Restrictions: Yes LLE Weight Bearing: Weight bearing as tolerated    Mobility  Bed Mobility Overal bed mobility: Needs Assistance Bed Mobility: Supine to Sit     Supine to sit: Supervision     General bed mobility comments: pt able to get legs off bed and to edge without physical assist  Transfers Overall transfer level: Needs assistance Equipment used: Rolling walker (2 wheeled) Transfers: Sit to/from Stand Sit to Stand: Min guard         General transfer comment: min-guard from bed and toilet. Pt with increased pain with sit to stand  Ambulation/Gait Ambulation/Gait assistance: Min guard Ambulation Distance (Feet): 200 Feet Assistive device: Rolling walker (2 wheeled) Gait Pattern/deviations: Step-through pattern;Decreased stride length;Decreased weight shift to left   Gait velocity interpretation: at or above normal speed for age/gender General Gait Details: pt remaining alert while ambualting this  afternoon. Fast gait pattern and pt reports she is a fast walker but had to cue her to slow down at one point for safety and take standing rest one time   Stairs            Wheelchair Mobility    Modified Rankin (Stroke Patients Only)       Balance Overall balance assessment: Needs assistance Sitting-balance support: No upper extremity supported;Feet supported Sitting balance-Leahy Scale: Good     Standing balance support: Bilateral upper extremity supported;During functional activity Standing balance-Leahy Scale: Fair Standing balance comment: able to maintain static standing without UE support                            Cognition Arousal/Alertness: Lethargic;Suspect due to medications Behavior During Therapy: [facility] for tasks assessed/performed Overall Cognitive Status: Within Functional Limits for tasks assessed                                        Exercises Total Joint Exercises Ankle Circles/Pumps: AROM;Both;20 reps Quad Sets: AROM;Both;10 reps;Seated Long Arc Quad: AROM;Both;15 reps;Seated    General Comments        Pertinent Vitals/Pain Pain Assessment: Faces Faces Pain Scale: Hurts even more Pain Location: L hip  Pain Descriptors / Indicators: Aching Pain Intervention(s): Limited activity within patient's tolerance;Monitored during session    Home Living                      Prior Function  PT Goals (current goals can now be found in the care plan section) Acute Rehab PT Goals Patient Stated Goal: return home PT Goal Formulation: With patient Time For Goal Achievement: 06/02/17 Potential to Achieve Goals: Good Progress towards PT goals: Progressing toward goals    Frequency    7X/week      PT Plan Current plan remains appropriate    Co-evaluation              AM-PAC PT "6 Clicks" Daily Activity  Outcome Measure  Difficulty turning over in bed (including adjusting bedclothes,  sheets and blankets)?: A Little Difficulty moving from lying on back to sitting on the side of the bed? : A Little Difficulty sitting down on and standing up from a chair with arms (e.g., wheelchair, bedside commode, etc,.)?: A Little Help needed moving to and from a bed to chair (including a wheelchair)?: A Little Help needed walking in hospital room?: A Little Help needed climbing 3-5 steps with a railing? : A Little 6 Click Score: 18    End of Session   Activity Tolerance: Patient tolerated treatment well Patient left: in chair;with call bell/phone within reach;with family/visitor present Nurse Communication: Mobility status PT Visit Diagnosis: Other abnormalities of gait and mobility (R26.89);Pain Pain - Right/Left: Left Pain - part of body: Hip     Time: 6734-1937 PT Time Calculation (min) (ACUTE ONLY): 20 min  Charges:  $Gait Training: 8-22 mins                    G Codes:       Peever  Blair 05/27/2017, 3:26 PM

## 2017-05-27 NOTE — Plan of Care (Signed)
  Progressing Education: Knowledge of General Education information will improve 05/27/2017 1250 - Progressing by Rance Muir, RN Health Behavior/Discharge Planning: Ability to manage health-related needs will improve 05/27/2017 1250 - Progressing by Rance Muir, RN Clinical Measurements: Ability to maintain clinical measurements within normal limits will improve 05/27/2017 1250 - Progressing by Rance Muir, RN Will remain free from infection 05/27/2017 1250 - Progressing by Rance Muir, RN Diagnostic test results will improve 05/27/2017 1250 - Progressing by Rance Muir, RN Respiratory complications will improve 05/27/2017 1250 - Progressing by Rance Muir, RN Cardiovascular complication will be avoided 05/27/2017 1250 - Progressing by Rance Muir, RN Activity: Risk for activity intolerance will decrease 05/27/2017 1250 - Progressing by Rance Muir, RN Nutrition: Adequate nutrition will be maintained 05/27/2017 1250 - Progressing by Rance Muir, RN Coping: Level of anxiety will decrease 05/27/2017 1250 - Progressing by Rance Muir, RN Elimination: Will not experience complications related to bowel motility 05/27/2017 1250 - Progressing by Rance Muir, RN Will not experience complications related to urinary retention 05/27/2017 1250 - Progressing by Rance Muir, RN Pain Managment: General experience of comfort will improve 05/27/2017 1250 - Progressing by Rance Muir, RN Safety: Ability to remain free from injury will improve 05/27/2017 1250 - Progressing by Rance Muir, RN Skin Integrity: Risk for impaired skin integrity will decrease 05/27/2017 1250 - Progressing by Rance Muir, RN Education: Knowledge of the prescribed therapeutic regimen will improve 05/27/2017 1250 - Progressing by Rance Muir, RN Understanding of discharge needs will improve 05/27/2017 1250 - Progressing by Rance Muir, RN Activity: Ability to avoid complications of mobility impairment will improve 05/27/2017 1250 - Progressing by Rance Muir,  RN Ability to tolerate increased activity will improve 05/27/2017 1250 - Progressing by Rance Muir, RN Clinical Measurements: Postoperative complications will be avoided or minimized 05/27/2017 1250 - Progressing by Rance Muir, RN Pain Management: Pain level will decrease with appropriate interventions 05/27/2017 1250 - Progressing by Rance Muir, RN Skin Integrity: Signs of wound healing will improve 05/27/2017 1250 - Progressing by Rance Muir, RN

## 2017-05-27 NOTE — Progress Notes (Signed)
Physical Therapy Treatment Patient Details Name: Elizabeth [name] MRN: [id number] DOB: [birth year] Today's Date: 05/27/2017    History of Present Illness Pt is a 62 y/o female s/p elective L THA, anterior approach. PMH inlucdes L breast cancer s/p bilat mastectomy, and R knee surgery.     PT Comments    Pt continues to be nauseous but was not during session, however, was very groggy to the point she could hardly keep her eyes open even with ambulation. Ambulated 35' with min A and RW. Will hopefully be more alert this afternoon and be able to advance distance and practice steps. If not, recommend hold d.c home until tomorrow.     Follow Up Recommendations  DC plan and follow up therapy as arranged by surgeon;Supervision for mobility/OOB     Equipment Recommendations  None recommended by PT    Recommendations for Other Services       Precautions / Restrictions Precautions Precautions: Anterior Hip Precaution Booklet Issued: No Precaution Comments: reviewed precautions with pt and husband. Pt could not remember any of them due to lethargy Restrictions Weight Bearing Restrictions: Yes LLE Weight Bearing: Weight bearing as tolerated    Mobility  Bed Mobility Overal bed mobility: Needs Assistance Bed Mobility: Supine to Sit     Supine to sit: Min assist     General bed mobility comments: min A for LE off bed. Pt able to manage upper body  Transfers Overall transfer level: Needs assistance Equipment used: Rolling walker (2 wheeled) Transfers: Sit to/from Stand Sit to Stand: Min assist         General transfer comment: min A to steady while pt moved hands from bed to RW  Ambulation/Gait Ambulation/Gait assistance: Min assist Ambulation Distance (Feet): 75 Feet Assistive device: Rolling walker (2 wheeled) Gait Pattern/deviations: Step-through pattern;Decreased stride length;Decreased weight shift to left Gait velocity: decreased Gait velocity interpretation: Below  normal speed for age/gender General Gait Details: pt barely able to keep eyes open even with ambulation. Min A to steady, vc's for sequencing   Stairs            Wheelchair Mobility    Modified Rankin (Stroke Patients Only)       Balance Overall balance assessment: Needs assistance Sitting-balance support: No upper extremity supported;Feet supported Sitting balance-Leahy Scale: Good     Standing balance support: Bilateral upper extremity supported;During functional activity Standing balance-Leahy Scale: Poor Standing balance comment: Reliant on BUE support.                             Cognition Arousal/Alertness: Lethargic;Suspect due to medications Behavior During Therapy: [facility] for tasks assessed/performed Overall Cognitive Status: Within Functional Limits for tasks assessed                                        Exercises Total Joint Exercises Ankle Circles/Pumps: AROM;Both;20 reps Quad Sets: AROM;Both;10 reps;Seated Gluteal Sets: AROM;Both;10 reps;Seated Heel Slides: AAROM;Both;5 reps;Supine Hip ABduction/ADduction: AAROM;Left;10 reps;Supine(limited range for anterior hip) Straight Leg Raises: AAROM;Left;10 reps;Supine    General Comments        Pertinent Vitals/Pain Pain Assessment: 0-10 Pain Score: 4  Pain Location: L hip  Pain Descriptors / Indicators: Aching Pain Intervention(s): Limited activity within patient's tolerance    Home Living  Prior Function            PT Goals (current goals can now be found in the care plan section) Acute Rehab PT Goals Patient Stated Goal: to feel better  PT Goal Formulation: With patient Time For Goal Achievement: 06/02/17 Potential to Achieve Goals: Good Progress towards PT goals: Progressing toward goals    Frequency    7X/week      PT Plan Current plan remains appropriate    Co-evaluation              AM-PAC PT "6 Clicks" Daily  Activity  Outcome Measure  Difficulty turning over in bed (including adjusting bedclothes, sheets and blankets)?: A Little Difficulty moving from lying on back to sitting on the side of the bed? : Unable Difficulty sitting down on and standing up from a chair with arms (e.g., wheelchair, bedside commode, etc,.)?: Unable Help needed moving to and from a bed to chair (including a wheelchair)?: A Little Help needed walking in hospital room?: A Little Help needed climbing 3-5 steps with a railing? : A Lot 6 Click Score: 13    End of Session Equipment Utilized During Treatment: Gait belt Activity Tolerance: Patient limited by lethargy Patient left: in chair;with call bell/phone within reach;with family/visitor present Nurse Communication: Mobility status PT Visit Diagnosis: Other abnormalities of gait and mobility (R26.89);Pain Pain - Right/Left: Left Pain - part of body: Hip     Time: 5035-4656 PT Time Calculation (min) (ACUTE ONLY): 25 min  Charges:  $Gait Training: 8-22 mins $Therapeutic Exercise: 8-22 mins                    G Codes:       Leighton Roach, PT  Acute Rehab Services  [place] 05/27/2017, 9:40 AM

## 2017-05-27 NOTE — Care Management Note (Signed)
Case Management Note  Patient Details  Name: Elizabeth [name] MRN: [id number] Date of Birth: [birth year]  Subjective/Objective:     Left TKA               Action/Plan: Spoke to husband at bedside. Pt was asleep. Offered choice for HH/list provided. Spouse states dtr is a Community education officer and will provide PT in the home. [facility] aware. Dtr has discussed with Psychologist, sport and exercise. Has RW and 3n1 at home.   Expected Discharge Date:                 Expected Discharge Plan:  [place]  In-House Referral:  NA  Discharge planning Services  CM Consult  Post Acute Care Choice:  Home Health Choice offered to:  Spouse  DME Arranged:  N/A DME Agency:  NA  HH Arranged:  PT, Patient Refused [place] Agency:  [facility] (formerly [employer])  Status of Service:  Completed, signed off  If discussed at [employer], dates discussed:    Additional Comments:  Erenest Rasher, RN 05/27/2017, 3:05 PM

## 2017-05-28 LAB — CBC
HCT: 30.6 % — ABNORMAL LOW (ref 36.0–46.0)
Hemoglobin: 9.9 g/dL — ABNORMAL LOW (ref 12.0–15.0)
MCH: 31.4 pg (ref 26.0–34.0)
MCHC: 32.4 g/dL (ref 30.0–36.0)
MCV: 97.1 fL (ref 78.0–100.0)
PLATELETS: 246 10*3/uL (ref 150–400)
RBC: 3.15 MIL/uL — AB (ref 3.87–5.11)
RDW: 13.2 % (ref 11.5–15.5)
WBC: 9 10*3/uL (ref 4.0–10.5)

## 2017-05-28 LAB — HEMOGLOBIN A1C
Hgb A1c MFr Bld: 5.4 % (ref 4.8–5.6)
Mean Plasma Glucose: 108.28 mg/dL

## 2017-05-28 MED ORDER — HYDROCODONE-ACETAMINOPHEN 5-325 MG PO TABS
1.0000 | ORAL_TABLET | ORAL | Status: DC | PRN
  Administered 2017-05-28: 1 via ORAL
  Filled 2017-05-28: qty 1

## 2017-05-28 MED ORDER — TRAMADOL HCL 50 MG PO TABS: 50.0000 mg | ORAL_TABLET | Freq: Four times a day (QID) | ORAL | 0 refills | Status: DC | PRN

## 2017-05-28 MED ORDER — HYDROCODONE-ACETAMINOPHEN 5-325 MG PO TABS: 1.0000 | ORAL_TABLET | Freq: Four times a day (QID) | ORAL | 0 refills | Status: DC | PRN

## 2017-05-28 NOTE — Progress Notes (Signed)
PATIENT ID: Elizabeth [name]  MRN: [id number]  DOB/AGE:  July 28, 1955 / 62 y.o.  2 Days Post-Op Procedure(s) (LRB): TOTAL HIP ARTHROPLASTY ANTERIOR APPROACH (Left)    PROGRESS NOTE Subjective: Patient is alert, oriented, no Nausea, no Vomiting, yes passing gas. Taking PO well. Denies SOB, Chest or Calf Pain. Using Incentive Spirometer, PAS in place. Ambulate WBAT with pt walking 200 ft, Patient reports pain as soreness .    Objective: Vital signs in last 24 hours: Vitals:   05/27/17 0424 05/27/17 1300 05/27/17 1931 05/28/17 0354  BP: 125/60 131/70 118/60 139/76  Pulse: 100 (!) 101 100 95  Resp: 16 16 16 16   Temp: 98.5 F (36.9 C) 98.8 F (37.1 C) 98.3 F (36.8 C) 98.4 F (36.9 C)  TempSrc: Oral Oral Oral Oral  SpO2: 95% 94% 93% 95%  Weight:      Height:          Intake/Output from previous day: I/O last 3 completed shifts: In: 47 [P.O.:600; I.V.:180] Out: -    Intake/Output this shift: No intake/output data recorded.   LABORATORY DATA: Recent Labs    05/27/17 0622  WBC 6.9  HGB 9.7*  HCT 29.7*  PLT 247  NA 133*  K 4.7  CL 101  CO2 23  BUN 6  CREATININE 0.74  GLUCOSE 261*  CALCIUM 8.1*    Examination: Neurologically intact Neurovascular intact Sensation intact distally Intact pulses distally Dorsiflexion/Plantar flexion intact Incision: dressing C/D/I and scant drainage No cellulitis present Compartment soft}  Assessment:   2 Days Post-Op Procedure(s) (LRB): TOTAL HIP ARTHROPLASTY ANTERIOR APPROACH (Left) ADDITIONAL DIAGNOSIS: Expected Acute Blood Loss Anemia,  History of breast cancer  Plan: PT/OT WBAT, AROM and PROM  DVT Prophylaxis:  SCDx72hrs, ASA 325 mg BID x 2 weeks DISCHARGE PLAN: Home DISCHARGE NEEDS: HHPT, Walker and 3-in-1 comode seat     Joanell Rising 05/28/2017, 7:47 AM

## 2017-05-28 NOTE — Progress Notes (Signed)
Physical Therapy Treatment and Discharge Patient Details Name: Elizabeth [name] MRN: [id number] DOB: 07/28/1955 Today's Date: 05/28/2017    History of Present Illness Pt is a 62 y/o female s/p elective L THA, anterior approach. PMH inlucdes L breast cancer s/p bilat mastectomy, and R knee surgery.     PT Comments    Pt no longer lethargic and participating well in therapy today. Session focused on stair training, patient progressed to supervision level with husbands support. Pt and family educated on safety considerations for home and have no further questions or concerns at this time. Pt has met all functional goals and will benefit from skilled home health PT when medically cleared for d/c.     Follow Up Recommendations  Follow surgeon's recommendation for DC plan and follow-up therapies     Equipment Recommendations  None recommended by PT    Recommendations for Other Services       Precautions / Restrictions Precautions Precautions: Anterior Hip Precaution Booklet Issued: No Precaution Comments: reveiwed precautions with patient and husband, both able to recall 3/3 Restrictions Weight Bearing Restrictions: Yes LLE Weight Bearing: Weight bearing as tolerated    Mobility  Bed Mobility Overal bed mobility: Modified Independent Bed Mobility: Supine to Sit     Supine to sit: Modified independent (Device/Increase time)        Transfers Overall transfer level: Modified independent Equipment used: Rolling walker (2 wheeled) Transfers: Sit to/from Stand Sit to Stand: Modified independent (Device/Increase time) Stand pivot transfers: Modified independent (Device/Increase time)       General transfer comment: good hand placement and stability   Ambulation/Gait Ambulation/Gait assistance: Supervision Ambulation Distance (Feet): 200 Feet Assistive device: Rolling walker (2 wheeled) Gait Pattern/deviations: Step-through pattern;Decreased stride length;Decreased weight  shift to left Gait velocity: decreased   General Gait Details: pt more alert this session, equal step length less antalgic no rest breaks.    Stairs Stairs: Yes   Stair Management: No rails;Backwards;With walker Number of Stairs: 16 General stair comments: several trials with cues for sequencing and techinque, family education on guarding and discussion of safety considerations. Patient able to progress to supervision with husbands stabilization of RW, no LOB on stairs and reports she feels confident with stairs for return home today.   Wheelchair Mobility    Modified Rankin (Stroke Patients Only)       Balance Overall balance assessment: Needs assistance Sitting-balance support: No upper extremity supported;Feet supported Sitting balance-Leahy Scale: Good     Standing balance support: Bilateral upper extremity supported;During functional activity Standing balance-Leahy Scale: Fair Standing balance comment: able to maintain static standing without UE support                            Cognition Arousal/Alertness: Awake/alert Behavior During Therapy: WFL for tasks assessed/performed Overall Cognitive Status: Within Functional Limits for tasks assessed                                        Exercises Total Joint Exercises Knee Flexion: 10 reps Marching in Standing: 10 reps    General Comments        Pertinent Vitals/Pain Pain Assessment: Faces Faces Pain Scale: Hurts little more Pain Location: L hip  Pain Descriptors / Indicators: Aching Pain Intervention(s): Limited activity within patient's tolerance;Monitored during session;Repositioned;Premedicated before session    Home Living  Prior Function            PT Goals (current goals can now be found in the care plan section) Acute Rehab PT Goals Patient Stated Goal: return home PT Goal Formulation: With patient Time For Goal Achievement:  06/02/17 Potential to Achieve Goals: Good Progress towards PT goals: Progressing toward goals    Frequency           PT Plan Current plan remains appropriate    Co-evaluation              AM-PAC PT "6 Clicks" Daily Activity  Outcome Measure  Difficulty turning over in bed (including adjusting bedclothes, sheets and blankets)?: A Little Difficulty moving from lying on back to sitting on the side of the bed? : A Little Difficulty sitting down on and standing up from a chair with arms (e.g., wheelchair, bedside commode, etc,.)?: A Little Help needed moving to and from a bed to chair (including a wheelchair)?: A Little Help needed walking in hospital room?: A Little Help needed climbing 3-5 steps with a railing? : A Little 6 Click Score: 18    End of Session Equipment Utilized During Treatment: Gait belt Activity Tolerance: Patient tolerated treatment well Patient left: in chair;with call bell/phone within reach;with family/visitor present Nurse Communication: Mobility status PT Visit Diagnosis: Other abnormalities of gait and mobility (R26.89);Pain Pain - Right/Left: Left Pain - part of body: Hip     Time: 3428-7681 PT Time Calculation (min) (ACUTE ONLY): 40 min  Charges:  $Gait Training: 23-37 mins                    G Codes:       Reinaldo Berber, PT, DPT Acute Rehab Services Pager: [phone]     Reinaldo Berber 05/28/2017, 10:26 AM

## 2017-05-28 NOTE — Plan of Care (Signed)
  Adequate for Discharge Education: Knowledge of General Education information will improve 05/28/2017 1051 - Adequate for Discharge by Shahid Flori, Vertell Limber, RN Health Behavior/Discharge Planning: Ability to manage health-related needs will improve 05/28/2017 1051 - Adequate for Discharge by Jalicia Roszak, Vertell Limber, RN Clinical Measurements: Ability to maintain clinical measurements within normal limits will improve 05/28/2017 1051 - Adequate for Discharge by Leilynn Pilat, Vertell Limber, RN Will remain free from infection 05/28/2017 1051 - Adequate for Discharge by Pepe Mineau, Vertell Limber, RN Diagnostic test results will improve 05/28/2017 1051 - Adequate for Discharge by Rahima Fleishman, Vertell Limber, RN Respiratory complications will improve 05/28/2017 1051 - Adequate for Discharge by Evalee Jefferson, RN Cardiovascular complication will be avoided 05/28/2017 1051 - Adequate for Discharge by Evalee Jefferson, RN Activity: Risk for activity intolerance will decrease 05/28/2017 1051 - Adequate for Discharge by Evalee Jefferson, RN Nutrition: Adequate nutrition will be maintained 05/28/2017 1051 - Adequate for Discharge by Evalee Jefferson, RN Coping: Level of anxiety will decrease 05/28/2017 1051 - Adequate for Discharge by Viona Hosking, Vertell Limber, RN Elimination: Will not experience complications related to bowel motility 05/28/2017 1051 - Adequate for Discharge by Keerthana Vanrossum, Vertell Limber, RN Will not experience complications related to urinary retention 05/28/2017 1051 - Adequate for Discharge by Roniya Tetro, Vertell Limber, RN Pain Managment: General experience of comfort will improve 05/28/2017 1051 - Adequate for Discharge by Evalee Jefferson, RN Safety: Ability to remain free from injury will improve 05/28/2017 1051 - Adequate for Discharge by Kevia Zaucha, Vertell Limber, RN Skin Integrity: Risk for impaired skin integrity will decrease 05/28/2017 1051 - Adequate for Discharge by  Evalee Jefferson, RN Education: Knowledge of the prescribed therapeutic regimen will improve 05/28/2017 1051 - Adequate for Discharge by Catherin Doorn, Vertell Limber, RN Understanding of discharge needs will improve 05/28/2017 1051 - Adequate for Discharge by Yaslin Kirtley, Vertell Limber, RN Activity: Ability to avoid complications of mobility impairment will improve 05/28/2017 1051 - Adequate for Discharge by Gordie Crumby, Vertell Limber, RN Ability to tolerate increased activity will improve 05/28/2017 1051 - Adequate for Discharge by Jermichael Belmares, Vertell Limber, RN Clinical Measurements: Postoperative complications will be avoided or minimized 05/28/2017 1051 - Adequate for Discharge by Evalee Jefferson, RN Pain Management: Pain level will decrease with appropriate interventions 05/28/2017 1051 - Adequate for Discharge by Evalee Jefferson, RN Skin Integrity: Signs of wound healing will improve 05/28/2017 1051 - Adequate for Discharge by Johnpaul Gillentine, Vertell Limber, RN

## 2017-05-28 NOTE — Discharge Summary (Signed)
Patient ID: Elizabeth [name] MRN: [id number] DOB/AGE: April 24, 1955 62 y.o.  Admit date: 05/26/2017 Discharge date: 05/28/2017  Admission Diagnoses:  Principal Problem:   Osteoarthritis of left hip Active Problems:   Primary osteoarthritis of left hip   Discharge Diagnoses:  Same  Past Medical History:  Diagnosis Date  . Arthritis   . Breast cancer ([place]) 2010   Left  . Chronic sinusitis   . Family history of adverse reaction to anesthesia    sister-nausea/vomiting  . History of chemotherapy 01/2009-03/2009  . Hypercholesteremia   . Hypothyroid    "inactive thyroid"  . Migraines   . RSD (reflex sympathetic dystrophy)     Surgeries: Procedure(s): TOTAL HIP ARTHROPLASTY ANTERIOR APPROACH on 05/26/2017   Consultants:   Discharged Condition: Improved  Hospital Course: Elizabeth [name] is an 62 y.o. female who was admitted 05/26/2017 for operative treatment ofOsteoarthritis of left hip. Patient has severe unremitting pain that affects sleep, daily activities, and work/hobbies. After pre-op clearance the patient was taken to the operating room on 05/26/2017 and underwent  Procedure(s): TOTAL HIP ARTHROPLASTY ANTERIOR APPROACH.    Patient was given perioperative antibiotics:  Anti-infectives (From admission, onward)   Start     Dose/Rate Route Frequency Ordered Stop   05/26/17 1400  amoxicillin (AMOXIL) capsule 500 mg     500 mg Oral 4 times daily 05/26/17 1020 05/30/17 1359   05/26/17 0600  ceFAZolin (ANCEF) IVPB 2g/100 mL premix     2 g 200 mL/hr over 30 Minutes Intravenous To [facility] 05/23/17 1013 05/26/17 0803       Patient was given sequential compression devices, early ambulation, and chemoprophylaxis to prevent DVT.  Patient benefited maximally from hospital stay and there were no complications.    Recent vital signs:  Patient Vitals for the past 24 hrs:  BP Temp Temp src Pulse Resp SpO2  05/28/17 0354 139/76 98.4 F (36.9 C) Oral 95 16 95 %  05/27/17 1931  118/60 98.3 F (36.8 C) Oral 100 16 93 %  05/27/17 1300 131/70 98.8 F (37.1 C) Oral (!) 101 16 94 %     Recent laboratory studies:  Recent Labs    05/27/17 0622  WBC 6.9  HGB 9.7*  HCT 29.7*  PLT 247  NA 133*  K 4.7  CL 101  CO2 23  BUN 6  CREATININE 0.74  GLUCOSE 261*  CALCIUM 8.1*     Discharge Medications:   Allergies as of 05/28/2017      Reactions   Shellfish Allergy Anaphylaxis, Other (See Comments)   Allergist said she was "highly allergic to shellfish"   Sulfa Antibiotics Rash, Other (See Comments)   Looks like severe sunburn      Medication List    TAKE these medications   amoxicillin 500 MG capsule Commonly known as:  AMOXIL Take 500 mg by mouth 4 (four) times daily.   aspirin EC 325 MG tablet Take 1 tablet (325 mg total) by mouth 2 (two) times daily.   Biotin 5000 MCG Tabs Take 5,000 mcg by mouth daily.   cholecalciferol 1000 units tablet Commonly known as:  VITAMIN D Take 5,000 Units by mouth daily.   HYDROcodone-acetaminophen 5-325 MG tablet Commonly known as:  NORCO Take 1 tablet by mouth every 6 (six) hours as needed.   letrozole 2.5 MG tablet Commonly known as:  FEMARA Take 1 tablet (2.5 mg total) by mouth daily.   levothyroxine 25 MCG tablet Commonly known as:  SYNTHROID, LEVOTHROID Take 25  mcg by mouth daily.   multivitamin tablet Take 1 tablet by mouth daily.   OMEGA 3 PO Take 1 capsule by mouth daily.   prednisoLONE acetate 1 % ophthalmic suspension Commonly known as:  PRED FORTE Place 1 drop into both eyes daily.   tiZANidine 2 MG tablet Commonly known as:  ZANAFLEX Take 1 tablet (2 mg total) by mouth every 6 (six) hours as needed for muscle spasms.   traMADol 50 MG tablet Commonly known as:  ULTRAM Take 1 tablet (50 mg total) by mouth every 6 (six) hours as needed.   TYLENOL SINUS SEVERE 5-325-200 MG Tabs Generic drug:  Phenylephrine-APAP-Guaifenesin Take 1 tablet by mouth 3 (three) times daily as needed (for  sinus congestion).            Durable Medical Equipment  (From admission, onward)        Start     Ordered   05/26/17 1021  DME Walker rolling  Once    Question:  Patient needs a walker to treat with the following condition  Answer:  Status post total hip replacement, left   05/26/17 1021   05/26/17 1021  DME 3 n 1  Once     05/26/17 1020   05/26/17 1021  DME Bedside commode  Once    Question:  Patient needs a bedside commode to treat with the following condition  Answer:  Status post total hip replacement, left   05/26/17 1020      Diagnostic Studies: Dg Chest 2 View  Result Date: 05/16/2017 CLINICAL DATA:  Preoperative evaluation for upcoming hip replacement EXAM: CHEST  2 VIEW COMPARISON:  06/25/2010 FINDINGS: The heart size and mediastinal contours are within normal limits. Both lungs are clear. The visualized skeletal structures are unremarkable. Postsurgical changes are noted in the axilla bilaterally. IMPRESSION: No active cardiopulmonary disease. Electronically Signed   By: Inez Catalina M.D.   On: 05/16/2017 09:19   Dg C-arm 1-60 Min  Result Date: 05/26/2017 CLINICAL DATA:  Left total hip arthroplasty. EXAM: OPERATIVE LEFT HIP (WITH PELVIS IF PERFORMED) 2 VIEWS TECHNIQUE: Fluoroscopic spot image(s) were submitted for interpretation post-operatively. FLUOROSCOPY TIME:  C-arm fluoroscopic images were obtained intraoperatively and submitted for post operative interpretation. Please see the performing provider's procedural report for the fluoroscopy time utilized. COMPARISON:  Left hip MRI 03/17/2017 FINDINGS: Two intraoperative fluoroscopic images of the left hip are provided. A left total hip arthroplasty has been performed. The prosthetic femoral and acetabular components appear normally located on these frontal projection images. No acute fracture is evident. IMPRESSION: Intraoperative images during left total hip arthroplasty. Electronically Signed   By: Logan Bores M.D.   On:  05/26/2017 08:36   Dg Hip Operative Unilat W Or W/o Pelvis Left  Result Date: 05/26/2017 CLINICAL DATA:  Left total hip arthroplasty. EXAM: OPERATIVE LEFT HIP (WITH PELVIS IF PERFORMED) 2 VIEWS TECHNIQUE: Fluoroscopic spot image(s) were submitted for interpretation post-operatively. FLUOROSCOPY TIME:  C-arm fluoroscopic images were obtained intraoperatively and submitted for post operative interpretation. Please see the performing provider's procedural report for the fluoroscopy time utilized. COMPARISON:  Left hip MRI 03/17/2017 FINDINGS: Two intraoperative fluoroscopic images of the left hip are provided. A left total hip arthroplasty has been performed. The prosthetic femoral and acetabular components appear normally located on these frontal projection images. No acute fracture is evident. IMPRESSION: Intraoperative images during left total hip arthroplasty. Electronically Signed   By: Logan Bores M.D.   On: 05/26/2017 08:36    Disposition:  Discharge Instructions    Call MD / Call 911   Complete by:  As directed    If you experience chest pain or shortness of breath, CALL 911 and be transported to the hospital emergency room.  If you develope a fever above 101 F, pus (white drainage) or increased drainage or redness at the wound, or calf pain, call your surgeon's office.   Constipation Prevention   Complete by:  As directed    Drink plenty of fluids.  Prune juice may be helpful.  You may use a stool softener, such as Colace (over the counter) 100 mg twice a day.  Use MiraLax (over the counter) for constipation as needed.   Diet - low sodium heart healthy   Complete by:  As directed    Driving restrictions   Complete by:  As directed    No driving for 2 weeks   Follow the hip precautions as taught in Physical Therapy   Complete by:  As directed    Increase activity slowly as tolerated   Complete by:  As directed    Patient may shower   Complete by:  As directed    You may shower  without a dressing once there is no drainage.  Do not wash over the wound.  If drainage remains, cover wound with plastic wrap and then shower.      Follow-up Information    Frederik Pear, MD Follow up in 2 week(s).   Specialty:  Orthopedic Surgery Contact information: [place] [id number] [phone]            Signed: Joanell Rising 05/28/2017, 7:51 AM

## 2017-06-10 DIAGNOSIS — M25552 Pain in left hip: Secondary | ICD-10-CM | POA: Diagnosis not present

## 2017-07-08 DIAGNOSIS — M25552 Pain in left hip: Secondary | ICD-10-CM | POA: Diagnosis not present

## 2017-07-08 DIAGNOSIS — Z96642 Presence of left artificial hip joint: Secondary | ICD-10-CM | POA: Diagnosis not present

## 2017-07-08 DIAGNOSIS — Z471 Aftercare following joint replacement surgery: Secondary | ICD-10-CM | POA: Diagnosis not present

## 2017-08-08 DIAGNOSIS — H26492 Other secondary cataract, left eye: Secondary | ICD-10-CM | POA: Diagnosis not present

## 2017-08-19 DIAGNOSIS — M7062 Trochanteric bursitis, left hip: Secondary | ICD-10-CM | POA: Diagnosis not present

## 2017-08-19 DIAGNOSIS — Z471 Aftercare following joint replacement surgery: Secondary | ICD-10-CM | POA: Diagnosis not present

## 2017-08-19 DIAGNOSIS — Z96642 Presence of left artificial hip joint: Secondary | ICD-10-CM | POA: Diagnosis not present

## 2017-09-05 DIAGNOSIS — H26491 Other secondary cataract, right eye: Secondary | ICD-10-CM | POA: Diagnosis not present

## 2017-11-08 DIAGNOSIS — M546 Pain in thoracic spine: Secondary | ICD-10-CM | POA: Diagnosis not present

## 2017-11-08 DIAGNOSIS — M545 Low back pain: Secondary | ICD-10-CM | POA: Diagnosis not present

## 2017-11-13 DIAGNOSIS — C799 Secondary malignant neoplasm of unspecified site: Secondary | ICD-10-CM

## 2017-11-13 DIAGNOSIS — C7951 Secondary malignant neoplasm of bone: Secondary | ICD-10-CM

## 2017-11-13 HISTORY — DX: Secondary malignant neoplasm of unspecified site: C79.9

## 2017-11-13 HISTORY — DX: Secondary malignant neoplasm of bone: C79.51

## 2017-11-14 DIAGNOSIS — M546 Pain in thoracic spine: Secondary | ICD-10-CM | POA: Diagnosis not present

## 2017-11-17 DIAGNOSIS — M546 Pain in thoracic spine: Secondary | ICD-10-CM | POA: Diagnosis not present

## 2017-11-20 ENCOUNTER — Telehealth: Payer: Self-pay

## 2017-11-20 NOTE — Telephone Encounter (Signed)
Returned Meredith's call from [employer] [facility] regarding referral for existing patient.  Patient with new findings on imaging and needs to be seen by Dr. Lindi Adie.  Referral obtained, collaborated with Biomedical engineer.  Contacted patient with attempt to schedule appointment, no answer.  Message left.  Scheduling message sent.

## 2017-11-21 ENCOUNTER — Telehealth: Payer: Self-pay | Admitting: Hematology and Oncology

## 2017-11-21 NOTE — Telephone Encounter (Signed)
Patients doctors office called to schedule an appt per 8/8 sch message.

## 2017-11-23 NOTE — Assessment & Plan Note (Addendum)
Left breast cancer invasive ductal carcinoma 2.5 cm in size grade 1, ER 99%, PR 41%, Ki-67 11%, HER-2 negative T2 N0 M0 stage II a status post neoadjuvant chemotherapy followed by surgery February 2011 and was on antiestrogen therapy with Femara from January 2011-July 2017  New onset mid back pain: MRI revealed T11 severe pathologic fracture, abnormal signal T10, T11 and T12, moderate canal stenosis at T11  Recommendation: 1.  Biopsy of the T11 vertebral body 2. referral to radiation oncology 3.  Send biopsy material for foundation 1, PDL 1, PI 3K mutation testing 4.  Obtain blood for BRCA mutation testing  Return to clinic in 2 weeks to discuss the treatment plan. I briefly provided her with information on Ibrance with Faslodex.

## 2017-11-24 ENCOUNTER — Inpatient Hospital Stay: Payer: 59

## 2017-11-24 ENCOUNTER — Inpatient Hospital Stay: Payer: 59 | Attending: Hematology and Oncology | Admitting: Hematology and Oncology

## 2017-11-24 ENCOUNTER — Telehealth: Payer: Self-pay | Admitting: Hematology and Oncology

## 2017-11-24 ENCOUNTER — Telehealth: Payer: Self-pay

## 2017-11-24 VITALS — BP 160/96 | HR 98 | Temp 97.8°F | Resp 18 | Ht 64.0 in | Wt 122.2 lb

## 2017-11-24 DIAGNOSIS — Z9013 Acquired absence of bilateral breasts and nipples: Secondary | ICD-10-CM | POA: Insufficient documentation

## 2017-11-24 DIAGNOSIS — Z923 Personal history of irradiation: Secondary | ICD-10-CM | POA: Diagnosis not present

## 2017-11-24 DIAGNOSIS — M8448XA Pathological fracture, other site, initial encounter for fracture: Secondary | ICD-10-CM | POA: Diagnosis present

## 2017-11-24 DIAGNOSIS — C50512 Malignant neoplasm of lower-outer quadrant of left female breast: Secondary | ICD-10-CM

## 2017-11-24 DIAGNOSIS — C7951 Secondary malignant neoplasm of bone: Secondary | ICD-10-CM | POA: Diagnosis present

## 2017-11-24 DIAGNOSIS — Z9221 Personal history of antineoplastic chemotherapy: Secondary | ICD-10-CM | POA: Insufficient documentation

## 2017-11-24 DIAGNOSIS — C7989 Secondary malignant neoplasm of other specified sites: Secondary | ICD-10-CM | POA: Diagnosis not present

## 2017-11-24 DIAGNOSIS — Z170 Estrogen receptor positive status [ER+]: Secondary | ICD-10-CM

## 2017-11-24 LAB — CMP (CANCER CENTER ONLY)
ALK PHOS: 146 U/L — AB (ref 38–126)
ALT: 11 U/L (ref 0–44)
AST: 23 U/L (ref 15–41)
Albumin: 3.9 g/dL (ref 3.5–5.0)
Anion gap: 11 (ref 5–15)
BILIRUBIN TOTAL: 0.3 mg/dL (ref 0.3–1.2)
BUN: 22 mg/dL (ref 8–23)
CALCIUM: 9.9 mg/dL (ref 8.9–10.3)
CO2: 26 mmol/L (ref 22–32)
CREATININE: 1.07 mg/dL — AB (ref 0.44–1.00)
Chloride: 105 mmol/L (ref 98–111)
GFR, EST NON AFRICAN AMERICAN: 54 mL/min — AB (ref >60)
Glucose, Bld: 103 mg/dL — ABNORMAL HIGH (ref 70–99)
Potassium: 4.1 mmol/L (ref 3.5–5.1)
Sodium: 142 mmol/L (ref 135–145)
TOTAL PROTEIN: 7.5 g/dL (ref 6.5–8.1)

## 2017-11-24 LAB — CBC WITH DIFFERENTIAL (CANCER CENTER ONLY)
BASOS PCT: 0 %
Basophils Absolute: 0 10*3/uL (ref 0.0–0.1)
EOS ABS: 0.1 10*3/uL (ref 0.0–0.5)
Eosinophils Relative: 1 %
HCT: 40.7 % (ref 34.8–46.6)
HEMOGLOBIN: 13.2 g/dL (ref 11.6–15.9)
Lymphocytes Relative: 26 %
Lymphs Abs: 1.5 10*3/uL (ref 0.9–3.3)
MCH: 30.6 pg (ref 25.1–34.0)
MCHC: 32.4 g/dL (ref 31.5–36.0)
MCV: 94.2 fL (ref 79.5–101.0)
MONOS PCT: 9 %
Monocytes Absolute: 0.5 10*3/uL (ref 0.1–0.9)
NEUTROS PCT: 64 %
Neutro Abs: 3.7 10*3/uL (ref 1.5–6.5)
Platelet Count: 387 10*3/uL (ref 145–400)
RBC: 4.32 MIL/uL (ref 3.70–5.45)
RDW: 12.8 % (ref 11.2–14.5)
WBC: 5.8 10*3/uL (ref 3.9–10.3)

## 2017-11-24 MED ORDER — OXYCODONE-ACETAMINOPHEN 5-325 MG PO TABS: 1.0000 | ORAL_TABLET | ORAL | 0 refills | Status: DC | PRN

## 2017-11-24 MED ORDER — TRAMADOL HCL 50 MG PO TABS: 50.0000 mg | ORAL_TABLET | Freq: Four times a day (QID) | ORAL | Status: DC | PRN

## 2017-11-24 NOTE — Telephone Encounter (Signed)
Left message for patient to call back regarding BRCA lab needing to be drawn.

## 2017-11-24 NOTE — Telephone Encounter (Signed)
Per 8/12 patient decline avs and calendar

## 2017-11-24 NOTE — Progress Notes (Signed)
Patient Care Team: [name] as PCP - General (Nurse Practitioner)  DIAGNOSIS:  Encounter Diagnoses  Name Primary?  . Malignant neoplasm of lower-outer quadrant of left breast of female, estrogen receptor positive ([place])   . Bone metastasis ([place]) Yes    SUMMARY OF ONCOLOGIC HISTORY:   Breast cancer of lower-outer quadrant of left female breast ([place])   12/13/2008 Initial Diagnosis    Left breast biopsy: Invasive ductal carcinoma ER 90% percent, PR 41%, Ki-67 11%, HER-2 negative ratio 1, Oncotype DX score 23, ROR15%    01/27/2009 - 03/10/2009 Neo-Adjuvant Chemotherapy    Neoadjuvant FEC 4; participant in the "bed sheets" study.    05/12/2009 -  Anti-estrogen oral therapy    Femara 2.5 mg daily    05/22/2009 Surgery    Bilateral mastectomy stage IIB T2 N0 M0 IDC left breast grade 1, 2.5 cm, ER 99%, PR 41%, Ki-67 11%, HER-2 negative    12/10/2009 Surgery    Breast reconstruction surgery    11/14/2017 Relapse/Recurrence    Mid back pain: MRI revealed T11 severe pathologic fracture, abnormal signal T10, T11 and T12, moderate canal stenosis at T11     CHIEF COMPLIANT: New onset mid back pain  INTERVAL HISTORY: Elizabeth [name] is a 62 year old with above-mentioned history of bilateral mastectomy for left breast cancer who was treated with neoadjuvant chemotherapy followed by antiestrogen therapy.  She completed antiestrogen therapy in 2017.  Recently she was having increasing mid back pain.  She went to see orthopedics who performed a spine MRI and revealed multilevel findings suspicious for metastatic disease.  She was referred to me once again to discuss the treatment options.  She has tramadol but does not appear to be taking it.  She has excruciating pain especially in the evening.  She works full-time at [employer].  REVIEW OF SYSTEMS:   Constitutional: Denies fevers, chills or abnormal weight loss Eyes: Denies blurriness of vision Ears, nose, mouth, throat, and  face: Denies mucositis or sore throat Respiratory: Denies cough, dyspnea or wheezes Cardiovascular: Denies palpitation, chest discomfort Gastrointestinal:  Denies nausea, heartburn or change in bowel habits Skin: Denies abnormal skin rashes Lymphatics: Denies new lymphadenopathy or easy bruising Neurological:Denies numbness, tingling or new weaknesses Behavioral/Psych: Mood is stable, no new changes  Extremities: No lower extremity edema  All other systems were reviewed with the patient and are negative.  I have reviewed the past medical history, past surgical history, social history and family history with the patient and they are unchanged from previous note.  ALLERGIES:  is allergic to shellfish allergy and sulfa antibiotics.  MEDICATIONS:  Current Outpatient Medications  Medication Sig Dispense Refill  . Biotin 5000 MCG TABS Take 5,000 mcg by mouth daily.     . cholecalciferol (VITAMIN D) 1000 UNITS tablet Take 5,000 Units by mouth daily.     Marland Kitchen levothyroxine (SYNTHROID, LEVOTHROID) 25 MCG tablet Take 25 mcg by mouth daily.    . Multiple Vitamin (MULTIVITAMIN) tablet Take 1 tablet by mouth daily.    . Omega-3 Fatty Acids (OMEGA 3 PO) Take 1 capsule by mouth daily.     Marland Kitchen oxyCODONE-acetaminophen (PERCOCET/ROXICET) 5-325 MG tablet Take 1 tablet by mouth every 4 (four) hours as needed for severe pain. 30 tablet 0  . prednisoLONE acetate (PRED FORTE) 1 % ophthalmic suspension Place 1 drop into both eyes daily.    . traMADol (ULTRAM) 50 MG tablet Take 1 tablet (50 mg total) by mouth every 6 (six) hours as  needed. 30 tablet    No current facility-administered medications for this visit.     PHYSICAL EXAMINATION: ECOG PERFORMANCE STATUS: 1 - Symptomatic but completely ambulatory  Vitals:   11/24/17 0817  BP: (!) 160/96  Pulse: 98  Resp: 18  Temp: 97.8 F (36.6 C)  SpO2: 100%   Filed Weights   11/24/17 0817  Weight: 122 lb 3.2 oz (55.4 kg)    GENERAL:alert, no distress and  comfortable SKIN: skin color, texture, turgor are normal, no rashes or significant lesions EYES: normal, Conjunctiva are pink and non-injected, sclera clear OROPHARYNX:no exudate, no erythema and lips, buccal mucosa, and tongue normal  NECK: supple, thyroid normal size, non-tender, without nodularity LYMPH:  no palpable lymphadenopathy in the cervical, axillary or inguinal LUNGS: clear to auscultation and percussion with normal breathing effort HEART: regular rate & rhythm and no murmurs and no lower extremity edema ABDOMEN:abdomen soft, non-tender and normal bowel sounds MUSCULOSKELETAL:no cyanosis of digits and no clubbing  NEURO: alert & oriented x 3 with fluent speech, no focal motor/sensory deficits EXTREMITIES: No lower extremity edema   LABORATORY DATA:  I have reviewed the data as listed CMP Latest Ref Rng & Units 11/24/2017 05/27/2017 05/16/2017  Glucose 70 - 99 mg/dL 103(H) 261(H) 87  BUN 8 - 23 mg/dL '22 6 16  ' Creatinine 0.44 - 1.00 mg/dL 1.07(H) 0.74 0.81  Sodium 135 - 145 mmol/L 142 133(L) 141  Potassium 3.5 - 5.1 mmol/L 4.1 4.7 3.8  Chloride 98 - 111 mmol/L 105 101 104  CO2 22 - 32 mmol/L '26 23 27  ' Calcium 8.9 - 10.3 mg/dL 9.9 8.1(L) 9.5  Total Protein 6.5 - 8.1 g/dL 7.5 - -  Total Bilirubin 0.3 - 1.2 mg/dL 0.3 - -  Alkaline Phos 38 - 126 U/L 146(H) - -  AST 15 - 41 U/L 23 - -  ALT 0 - 44 U/L 11 - -    Lab Results  Component Value Date   WBC 5.8 11/24/2017   HGB 13.2 11/24/2017   HCT 40.7 11/24/2017   MCV 94.2 11/24/2017   PLT 387 11/24/2017   NEUTROABS 3.7 11/24/2017    ASSESSMENT & PLAN:  Breast cancer of lower-outer quadrant of left female breast Left breast cancer invasive ductal carcinoma 2.5 cm in size grade 1, ER 99%, PR 41%, Ki-67 11%, HER-2 negative T2 N0 M0 stage II a status post neoadjuvant chemotherapy followed by surgery February 2011 and was on antiestrogen therapy with Femara from January 2011-July 2017  New onset mid back pain: MRI revealed T11  severe pathologic fracture, abnormal signal T10, T11 and T12, moderate canal stenosis at T11  Recommendation: 1.  Biopsy of the T11 vertebral body 2. referral to radiation oncology 3.  Send biopsy material for foundation 1, PDL 1, PI 3K mutation testing 4.  Obtain blood for BRCA mutation testing  Return to clinic in 2 weeks to discuss the treatment plan. I briefly provided her with information on Ibrance with Faslodex.       Orders Placed This Encounter  Procedures  . NM PET Image Initial (PI) Skull Base To Thigh    Standing Status:   Future    Standing Expiration Date:   11/24/2018    Order Specific Question:   ** REASON FOR EXAM (FREE TEXT)    Answer:   Bone mets seen on MRI    Order Specific Question:   If indicated for the ordered procedure, I authorize the administration of a radiopharmaceutical per Radiology protocol  Answer:   Yes    Order Specific Question:   Preferred imaging location?    Answer:   [facility]    Order Specific Question:   Radiology Contrast Protocol - do NOT remove file path    Answer:   [email or web address]  . CT BIOPSY    Standing Status:   Future    Standing Expiration Date:   12/29/2018    Scheduling Instructions:     Arrange after PET CT    Order Specific Question:   Reason for Exam (SYMPTOM  OR DIAGNOSIS REQUIRED)    Answer:   New bone mets    Order Specific Question:   Preferred imaging location?    Answer:   [facility]  . CBC with Differential ([place] Only)    Standing Status:   Future    Number of Occurrences:   1    Standing Expiration Date:   11/25/2018  . CMP ([place] only)    Standing Status:   Future    Number of Occurrences:   1    Standing Expiration Date:   11/25/2018  . BRCA Deletion/Duplication Test   The patient has a good understanding of the overall plan. she agrees with it. she will call with any problems that may develop before the next visit here.   Harriette Ohara, MD 11/24/17

## 2017-11-25 DIAGNOSIS — Z09 Encounter for follow-up examination after completed treatment for conditions other than malignant neoplasm: Secondary | ICD-10-CM | POA: Diagnosis not present

## 2017-11-25 DIAGNOSIS — Z96642 Presence of left artificial hip joint: Secondary | ICD-10-CM | POA: Diagnosis not present

## 2017-11-25 DIAGNOSIS — M25552 Pain in left hip: Secondary | ICD-10-CM | POA: Diagnosis not present

## 2017-11-27 ENCOUNTER — Telehealth: Payer: Self-pay

## 2017-11-27 ENCOUNTER — Other Ambulatory Visit: Payer: 59

## 2017-11-27 NOTE — Telephone Encounter (Signed)
Spoke with patient in regards to genetic testing, BRCA.  Patient has requested to hold off on lab until she is able to contact insurance company to ensure it is covered.  Nurse provided resources for financial assistance through [employer] if insurance does not cover.  Patient will call back to inform if she would like to proceed with testing.  No further needs at this time.

## 2017-12-03 ENCOUNTER — Encounter (HOSPITAL_COMMUNITY)
Admission: RE | Admit: 2017-12-03 | Discharge: 2017-12-03 | Disposition: A | Discharge status: Home / Self Care | Payer: 59 | Source: Ambulatory Visit | Attending: Hematology and Oncology | Admitting: Hematology and Oncology

## 2017-12-03 DIAGNOSIS — C7951 Secondary malignant neoplasm of bone: Secondary | ICD-10-CM | POA: Insufficient documentation

## 2017-12-03 DIAGNOSIS — Z170 Estrogen receptor positive status [ER+]: Secondary | ICD-10-CM | POA: Diagnosis present

## 2017-12-03 DIAGNOSIS — C50912 Malignant neoplasm of unspecified site of left female breast: Secondary | ICD-10-CM | POA: Diagnosis not present

## 2017-12-03 DIAGNOSIS — C50512 Malignant neoplasm of lower-outer quadrant of left female breast: Secondary | ICD-10-CM | POA: Insufficient documentation

## 2017-12-03 LAB — GLUCOSE, CAPILLARY: Glucose-Capillary: 100 mg/dL — ABNORMAL HIGH (ref 70–99)

## 2017-12-03 MED ORDER — FLUDEOXYGLUCOSE F - 18 (FDG) INJECTION
6.1400 | Freq: Once | INTRAVENOUS | Status: AC
  Administered 2017-12-03: 6.14 via INTRAVENOUS

## 2017-12-08 ENCOUNTER — Other Ambulatory Visit: Payer: Self-pay

## 2017-12-08 ENCOUNTER — Telehealth: Payer: Self-pay

## 2017-12-08 ENCOUNTER — Inpatient Hospital Stay: Payer: 59 | Admitting: Hematology and Oncology

## 2017-12-08 MED ORDER — TRAMADOL HCL 50 MG PO TABS: 50.0000 mg | ORAL_TABLET | Freq: Four times a day (QID) | ORAL | 0 refills | Status: DC | PRN

## 2017-12-08 MED ORDER — OXYCODONE-ACETAMINOPHEN 5-325 MG PO TABS: 1.0000 | ORAL_TABLET | ORAL | 0 refills | Status: DC | PRN

## 2017-12-08 MED ORDER — TRAMADOL HCL 50 MG PO TABS: 50.0000 mg | ORAL_TABLET | Freq: Four times a day (QID) | ORAL | Status: DC | PRN

## 2017-12-08 NOTE — Telephone Encounter (Signed)
Pt called wanting to know if she could be seen sooner than 9/3 d/t CT scans on 8/29 and increased pain.  Prescriptions for  Tramadol and Oxycodone written and placed in prescription book in triage for pickup - pt aware.   Per Dr Lindi Adie pt can be put on his schedule for 8/30 and cancel 9/3.  High priority msg sent to scheduling.  Pt is aware of new appt date and time.

## 2017-12-10 ENCOUNTER — Other Ambulatory Visit: Payer: Self-pay | Admitting: Radiology

## 2017-12-10 ENCOUNTER — Other Ambulatory Visit: Payer: Self-pay | Admitting: Physician Assistant

## 2017-12-11 ENCOUNTER — Other Ambulatory Visit: Payer: Self-pay | Admitting: Hematology and Oncology

## 2017-12-11 ENCOUNTER — Other Ambulatory Visit (HOSPITAL_COMMUNITY): Payer: Self-pay | Admitting: Hematology and Oncology

## 2017-12-11 ENCOUNTER — Ambulatory Visit (HOSPITAL_COMMUNITY)
Admission: RE | Admit: 2017-12-11 | Discharge: 2017-12-11 | Disposition: A | Discharge status: Home / Self Care | Payer: 59 | Source: Ambulatory Visit | Attending: Hematology and Oncology | Admitting: Hematology and Oncology

## 2017-12-11 ENCOUNTER — Other Ambulatory Visit: Payer: Self-pay

## 2017-12-11 DIAGNOSIS — G959 Disease of spinal cord, unspecified: Secondary | ICD-10-CM

## 2017-12-11 DIAGNOSIS — I700 Atherosclerosis of aorta: Secondary | ICD-10-CM | POA: Insufficient documentation

## 2017-12-11 DIAGNOSIS — J90 Pleural effusion, not elsewhere classified: Secondary | ICD-10-CM | POA: Insufficient documentation

## 2017-12-11 DIAGNOSIS — E7800 Pure hypercholesterolemia, unspecified: Secondary | ICD-10-CM | POA: Insufficient documentation

## 2017-12-11 DIAGNOSIS — M1990 Unspecified osteoarthritis, unspecified site: Secondary | ICD-10-CM | POA: Diagnosis not present

## 2017-12-11 DIAGNOSIS — E039 Hypothyroidism, unspecified: Secondary | ICD-10-CM | POA: Insufficient documentation

## 2017-12-11 DIAGNOSIS — Z170 Estrogen receptor positive status [ER+]: Secondary | ICD-10-CM | POA: Insufficient documentation

## 2017-12-11 DIAGNOSIS — Z79890 Hormone replacement therapy (postmenopausal): Secondary | ICD-10-CM | POA: Diagnosis not present

## 2017-12-11 DIAGNOSIS — Z96642 Presence of left artificial hip joint: Secondary | ICD-10-CM | POA: Insufficient documentation

## 2017-12-11 DIAGNOSIS — C7951 Secondary malignant neoplasm of bone: Secondary | ICD-10-CM | POA: Diagnosis not present

## 2017-12-11 DIAGNOSIS — C50512 Malignant neoplasm of lower-outer quadrant of left female breast: Secondary | ICD-10-CM | POA: Diagnosis present

## 2017-12-11 DIAGNOSIS — Z79899 Other long term (current) drug therapy: Secondary | ICD-10-CM | POA: Insufficient documentation

## 2017-12-11 DIAGNOSIS — Z9221 Personal history of antineoplastic chemotherapy: Secondary | ICD-10-CM | POA: Diagnosis not present

## 2017-12-11 DIAGNOSIS — G9050 Complex regional pain syndrome I, unspecified: Secondary | ICD-10-CM | POA: Insufficient documentation

## 2017-12-11 DIAGNOSIS — Z853 Personal history of malignant neoplasm of breast: Secondary | ICD-10-CM | POA: Diagnosis not present

## 2017-12-11 DIAGNOSIS — Z9013 Acquired absence of bilateral breasts and nipples: Secondary | ICD-10-CM | POA: Diagnosis not present

## 2017-12-11 DIAGNOSIS — G43909 Migraine, unspecified, not intractable, without status migrainosus: Secondary | ICD-10-CM | POA: Insufficient documentation

## 2017-12-11 DIAGNOSIS — M899 Disorder of bone, unspecified: Secondary | ICD-10-CM | POA: Diagnosis not present

## 2017-12-11 LAB — CBC
HCT: 38.9 % (ref 36.0–46.0)
HEMOGLOBIN: 12.6 g/dL (ref 12.0–15.0)
MCH: 30.1 pg (ref 26.0–34.0)
MCHC: 32.4 g/dL (ref 30.0–36.0)
MCV: 92.8 fL (ref 78.0–100.0)
Platelets: 385 10*3/uL (ref 150–400)
RBC: 4.19 MIL/uL (ref 3.87–5.11)
RDW: 12.1 % (ref 11.5–15.5)
WBC: 4.1 10*3/uL (ref 4.0–10.5)

## 2017-12-11 LAB — PROTIME-INR
INR: 0.94
Prothrombin Time: 12.5 seconds (ref 11.4–15.2)

## 2017-12-11 MED ORDER — LIDOCAINE HCL 1 % IJ SOLN
INTRAMUSCULAR | Status: AC
  Filled 2017-12-11: qty 20

## 2017-12-11 MED ORDER — FENTANYL CITRATE (PF) 100 MCG/2ML IJ SOLN
INTRAMUSCULAR | Status: AC | PRN
  Administered 2017-12-11 (×2): 50 ug via INTRAVENOUS
  Administered 2017-12-11: 25 ug via INTRAVENOUS

## 2017-12-11 MED ORDER — LIDOCAINE-EPINEPHRINE 1 %-1:100000 IJ SOLN
INTRAMUSCULAR | Status: AC
  Filled 2017-12-11: qty 1

## 2017-12-11 MED ORDER — FENTANYL CITRATE (PF) 100 MCG/2ML IJ SOLN
INTRAMUSCULAR | Status: AC
  Filled 2017-12-11: qty 2

## 2017-12-11 MED ORDER — MIDAZOLAM HCL 2 MG/2ML IJ SOLN
INTRAMUSCULAR | Status: AC
  Filled 2017-12-11: qty 2

## 2017-12-11 MED ORDER — OXYCODONE-ACETAMINOPHEN 5-325 MG PO TABS
ORAL_TABLET | ORAL | Status: AC
  Administered 2017-12-11: 1 via ORAL
  Filled 2017-12-11: qty 1

## 2017-12-11 MED ORDER — OXYCODONE-ACETAMINOPHEN 5-325 MG PO TABS
1.0000 | ORAL_TABLET | Freq: Once | ORAL | Status: AC
  Administered 2017-12-11: 1 via ORAL
  Filled 2017-12-11: qty 1

## 2017-12-11 MED ORDER — SODIUM CHLORIDE 0.9 % IV SOLN: INTRAVENOUS | Status: DC

## 2017-12-11 MED ORDER — MIDAZOLAM HCL 2 MG/2ML IJ SOLN
INTRAMUSCULAR | Status: AC | PRN
  Administered 2017-12-11 (×3): 1 mg via INTRAVENOUS

## 2017-12-11 NOTE — Discharge Instructions (Signed)

## 2017-12-11 NOTE — H&P (Signed)
Chief Complaint: Patient was seen in consultation today for breast cancer  Referring Physician(s): Gudena,Vinay  Supervising Physician: Sandi Mariscal  Patient Status: [facility] - Out-pt  History of Present Illness: Elizabeth [name] is a 62 y.o. female arthritis, hypothyroid, migraines, and left breast cancer treated with neoadjuvant chemotherapy completed in 2017 who was recently evaluated by orthopedics due to increased back pain.    MRI revealed mutlilevel findings suspicious for metastatic disease.   Subsequent PET scan 12/04/17 showed: 1. Hypermetabolic metastatic breast cancer involving thoracic and upper abdominal lymph nodes, spine and ribs. There may be hypermetabolic adenopathy in the left neck, incompletely imaged. 2. Hypermetabolic lymph node versus intramuscular metastasis involving the medial left pectoralis musculature. 3. Pathologic T11 fracture better visualized 11/14/2017. 4. Moderate left pleural effusion. 5.  Aortic atherosclerosis (ICD10-170.0).  IR consulted for biopsy at the request of Dr. Lindi Adie.  Case reviewed by Dr. Anselm Pancoast who approves patient for T11 lesion biopsy vs. Soft tissue biopsy.   Patient presents to IR today in her usual state of health.  She has been NPO.  She does not take blood thinners.   Past Medical History:  Diagnosis Date  . Arthritis   . Breast cancer ([place]) 2010   Left  . Chronic sinusitis   . Family history of adverse reaction to anesthesia    sister-nausea/vomiting  . History of chemotherapy 01/2009-03/2009  . Hypercholesteremia   . Hypothyroid    "inactive thyroid"  . Migraines   . RSD (reflex sympathetic dystrophy)     Past Surgical History:  Procedure Laterality Date  . BREAST RECONSTRUCTION    . EYE SURGERY Bilateral 7/18, 10/18  . KNEE ARTHROSCOPY Bilateral   . KNEE SURGERY Right    Two surgeries  . MASTECTOMY Bilateral 05/22/2009  . MASTECTOMY Bilateral 2011   restrictions on left arm-no labs/bloodpressure  . NASAL  SINUS SURGERY    . PORTA CATH INSERTION  12/2008  . PORTA CATH REMOVAL  05/2009  . ROOT CANAL    . SHOULDER SURGERY Right   . TOTAL HIP ARTHROPLASTY Left 05/26/2017   Procedure: TOTAL HIP ARTHROPLASTY ANTERIOR APPROACH;  Surgeon: Frederik Pear, MD;  Location: [place];  Service: Orthopedics;  Laterality: Left;    Allergies: Shellfish allergy and Sulfa antibiotics  Medications: Prior to Admission medications   Medication Sig Start Date End Date Taking? Authorizing Provider  Biotin 10000 MCG TABS Take 10,000 mcg by mouth daily.    Yes [provider]  Cholecalciferol (VITAMIN D PO) Take 5,000 Units by mouth daily.    Yes [provider]  levothyroxine (SYNTHROID, LEVOTHROID) 25 MCG tablet Take 25 mcg by mouth daily before breakfast.    Yes [provider]  Multiple Vitamin (MULTIVITAMIN) tablet Take 1 tablet by mouth daily.   Yes [provider]  Omega-3 Fatty Acids (SUPER OMEGA 3 EPA/DHA PO) Take 1 capsule by mouth daily.   Yes [provider]  oxyCODONE-acetaminophen (PERCOCET/ROXICET) 5-325 MG tablet Take 1 tablet by mouth every 4 (four) hours as needed for severe pain. 12/08/17  Yes Nicholas Lose, MD  prednisoLONE acetate (PRED FORTE) 1 % ophthalmic suspension Place 1 drop into both eyes daily.   Yes [provider]  traMADol (ULTRAM) 50 MG tablet Take 1 tablet (50 mg total) by mouth every 6 (six) hours as needed. 12/08/17  Yes Nicholas Lose, MD     Family History  Problem Relation Age of Onset  . Hypertension Mother     Social History  Socioeconomic History  . Marital status: Married    Spouse name: Not on file  . Number of children: Not on file  . Years of education: Not on file  . Highest education level: Not on file  Occupational History  . Not on file  Social Needs  . Financial resource strain: Not on file  . Food insecurity:    Worry: Not on file    Inability: Not on file  . Transportation needs:    Medical: Not on  file    Non-medical: Not on file  Tobacco Use  . Smoking status: Never Smoker  . Smokeless tobacco: Never Used  Substance and Sexual Activity  . Alcohol use: No  . Drug use: No  . Sexual activity: Yes    Birth control/protection: Post-menopausal  Lifestyle  . Physical activity:    Days per week: Not on file    Minutes per session: Not on file  . Stress: Not on file  Relationships  . Social connections:    Talks on phone: Not on file    Gets together: Not on file    Attends religious service: Not on file    Active member of club or organization: Not on file    Attends meetings of clubs or organizations: Not on file    Relationship status: Not on file  Other Topics Concern  . Not on file  Social History Narrative  . Not on file     Review of Systems: A 12 point ROS discussed and pertinent positives are indicated in the HPI above.  All other systems are negative.  Review of Systems  Constitutional: Negative for fatigue and fever.  Respiratory: Negative for cough and shortness of breath.   Cardiovascular: Negative for chest pain.  Gastrointestinal: Negative for abdominal pain, nausea and vomiting.  Genitourinary: Negative for dysuria.  Musculoskeletal: Positive for back pain.  Psychiatric/Behavioral: Negative for behavioral problems and confusion.    Vital Signs: BP (!) 149/80   Pulse 90   Temp (!) 96.5 F (35.8 C) (Temporal)   Ht 5\' 4"  (1.626 m)   Wt 122 lb (55.3 kg)   SpO2 100%   BMI 20.94 kg/m   Physical Exam  Constitutional: She is oriented to person, place, and time. She appears well-developed.  Neck: Normal range of motion. Neck supple.  Cardiovascular: Normal rate, regular rhythm and normal heart sounds.  Pulmonary/Chest: Effort normal and breath sounds normal. No respiratory distress.  Abdominal: Soft. She exhibits no distension. There is no tenderness.  Lymphadenopathy:    She has no cervical adenopathy (no palpable adenopathy, mild swelling on the  left chest wall compared to right).  Neurological: She is alert and oriented to person, place, and time.  Skin: Skin is warm and dry.  Psychiatric: She has a normal mood and affect. Her behavior is normal. Judgment and thought content normal.  Nursing note and vitals reviewed.    MD Evaluation Airway: WNL Heart: WNL Abdomen: WNL Chest/ Lungs: WNL ASA  Classification: 3 Mallampati/Airway Score: Three   Imaging: Nm Pet Image Initial (pi) Skull Base To Thigh  Result Date: 12/04/2017 CLINICAL DATA:  Initial treatment strategy for current left breast cancer. EXAM: NUCLEAR MEDICINE PET SKULL BASE TO THIGH TECHNIQUE: 6.1 mCi F-18 FDG was injected intravenously. Full-ring PET imaging was performed from the skull base to thigh after the radiotracer. CT data was obtained and used for attenuation correction and anatomic localization. Fasting blood glucose: 100 mg/dl COMPARISON:  MR thoracic spine 11/14/2017. FINDINGS:  Mediastinal blood pool activity: SUV max 2.2 NECK: Suspect a hypermetabolic high left level 2 lymph node with SUV max of 8.5. This area is incompletely imaged, however. No additional hypermetabolic lymph nodes in the neck. Incidental CT findings: None. CHEST: Hypermetabolic right supraclavicular lymph node measures 8 mm (CT image 23) with an SUV max of 7 1. Numerous hypermetabolic mediastinal lymph nodes are seen. Index low right paratracheal lymph node measuring 12 mm with an SUV max of 17.9. Hypermetabolic internal mammary adenopathy with an index 13 mm lymph node (CT image 45) and SUV max of 8 5. No hypermetabolic hilar or axillary lymph nodes. No hypermetabolic pulmonary nodules. Incidental CT findings: Atherosclerotic calcification of the arterial vasculature including coronary arteries. Heart is at the upper limits of in size. No pericardial effusion. Moderate simple appearing left pleural effusion with compressive atelectasis in the left lower lobe. ABDOMEN/PELVIS: 6 mm  juxtadiaphragmatic lymph node (CT image 77), SUV max 3.2. Left periaortic lymph nodes measure up to 7 mm (CT image 82) an SUV max of 4.1. No abnormal hypermetabolism in the liver, adrenal glands, spleen or pancreas. Incidental CT findings: Liver, gallbladder, adrenal glands, kidneys, spleen, pancreas, stomach and bowel are grossly unremarkable. SKELETON: Hypermetabolic lesions are seen in the spine and ribs. Index lytic lesions in the T11 vertebral body and posterior left eleventh rib have a collective SUV max of 11 4. Uptake within or deep to the medial left pectoralis musculature has an SUV max of 7.3 with a CT measurement of approximately 7 mm (CT image 35). Incidental CT findings: None. IMPRESSION: 1. Hypermetabolic metastatic breast cancer involving thoracic and upper abdominal lymph nodes, spine and ribs. There may be hypermetabolic adenopathy in the left neck, incompletely imaged. 2. Hypermetabolic lymph node versus intramuscular metastasis involving the medial left pectoralis musculature. 3. Pathologic T11 fracture better visualized 11/14/2017. 4. Moderate left pleural effusion. 5.  Aortic atherosclerosis (ICD10-170.0). Electronically Signed   By: Lorin Picket M.D.   On: 12/04/2017 08:37    Labs:  CBC: Recent Labs    05/27/17 0622 05/28/17 0621 11/24/17 0908 12/11/17 0641  WBC 6.9 9.0 5.8 4.1  HGB 9.7* 9.9* 13.2 12.6  HCT 29.7* 30.6* 40.7 38.9  PLT 247 246 387 385    COAGS: Recent Labs    05/16/17 0841 12/11/17 0711  INR 0.96 0.94  APTT 29  --     BMP: Recent Labs    05/16/17 0841 05/27/17 0622 11/24/17 0908  NA 141 133* 142  K 3.8 4.7 4.1  CL 104 101 105  CO2 27 23 26   GLUCOSE 87 261* 103*  BUN 16 6 22   CALCIUM 9.5 8.1* 9.9  CREATININE 0.81 0.74 1.07*  GFRNONAA >60 >60 54*  GFRAA >60 >60 >60    LIVER FUNCTION TESTS: Recent Labs    11/24/17 0908  BILITOT 0.3  AST 23  ALT 11  ALKPHOS 146*  PROT 7.5  ALBUMIN 3.9    TUMOR MARKERS: No results for  input(s): AFPTM, CEA, CA199, CHROMGRNA in the last 8760 hours.  Assessment and Plan: Patient with past medical history of left breast cancer s/p treatment from 2011-2017 presents with complaint of back pain, abnormal findings suspicious for metastatic disease on MRI and PET scan.  IR consulted for biopsy at the request of Dr. Lindi Adie. Case reviewed by Dr. Anselm Pancoast who approves patient for procedure.  Patient presents today in their usual state of health.  She has been NPO and is not currently on blood thinners.  Risks and benefits discussed with the patient including, but not limited to bleeding, infection, damage to adjacent structures or low yield requiring additional tests.  All of the patient's questions were answered, patient is agreeable to proceed. Consent signed and in chart.   Thank you for this interesting consult.  I greatly enjoyed meeting Elizabeth [name] and look forward to participating in their care.  A copy of this report was sent to the requesting provider on this date.  Electronically Signed: Docia Barrier, PA 12/11/2017, 8:21 AM   I spent a total of  30 Minutes   in face to face in clinical consultation, greater than 50% of which was counseling/coordinating care for breast cancer.

## 2017-12-11 NOTE — Procedures (Signed)
Pre procedural Dx: Metastatic Breast Cancer  Post procedural Dx: Same  Technically successful CT guided biopsy of lytic lesion involving the T11 vertebral body.   EBL: None.   Complications: None immediate.   Ronny Bacon, MD Pager #: [phone]

## 2017-12-11 NOTE — Sedation Documentation (Signed)
Patient discharged from Radiology nurses stations. Discharge AVS teaching reviewed with patient and husband. They both state understanding without any further questions.

## 2017-12-11 NOTE — Progress Notes (Signed)
PA notified patient with back pain after procedure.  Has acute back pain related to pathology and typically takes percocet at home.  Ordered home dose of percocet.   Brynda Greathouse, MS RD PA-C 10:29 AM

## 2017-12-12 ENCOUNTER — Ambulatory Visit
Admission: RE | Admit: 2017-12-12 | Discharge: 2017-12-12 | Disposition: A | Discharge status: Home / Self Care | Payer: 59 | Source: Ambulatory Visit | Attending: Radiation Oncology | Admitting: Radiation Oncology

## 2017-12-12 ENCOUNTER — Telehealth: Payer: Self-pay | Admitting: Hematology and Oncology

## 2017-12-12 ENCOUNTER — Inpatient Hospital Stay (HOSPITAL_BASED_OUTPATIENT_CLINIC_OR_DEPARTMENT_OTHER): Payer: 59 | Admitting: Hematology and Oncology

## 2017-12-12 ENCOUNTER — Other Ambulatory Visit: Payer: Self-pay

## 2017-12-12 ENCOUNTER — Encounter: Payer: Self-pay | Admitting: Radiation Oncology

## 2017-12-12 VITALS — BP 155/92 | HR 86 | Temp 97.5°F | Resp 18 | Ht 64.0 in | Wt 123.0 lb

## 2017-12-12 DIAGNOSIS — Z853 Personal history of malignant neoplasm of breast: Secondary | ICD-10-CM | POA: Diagnosis not present

## 2017-12-12 DIAGNOSIS — E039 Hypothyroidism, unspecified: Secondary | ICD-10-CM | POA: Insufficient documentation

## 2017-12-12 DIAGNOSIS — G9050 Complex regional pain syndrome I, unspecified: Secondary | ICD-10-CM | POA: Insufficient documentation

## 2017-12-12 DIAGNOSIS — Z170 Estrogen receptor positive status [ER+]: Secondary | ICD-10-CM | POA: Insufficient documentation

## 2017-12-12 DIAGNOSIS — C7989 Secondary malignant neoplasm of other specified sites: Secondary | ICD-10-CM

## 2017-12-12 DIAGNOSIS — M8458XA Pathological fracture in neoplastic disease, other specified site, initial encounter for fracture: Secondary | ICD-10-CM | POA: Diagnosis not present

## 2017-12-12 DIAGNOSIS — M129 Arthropathy, unspecified: Secondary | ICD-10-CM | POA: Insufficient documentation

## 2017-12-12 DIAGNOSIS — Z9013 Acquired absence of bilateral breasts and nipples: Secondary | ICD-10-CM | POA: Diagnosis not present

## 2017-12-12 DIAGNOSIS — M8448XA Pathological fracture, other site, initial encounter for fracture: Secondary | ICD-10-CM

## 2017-12-12 DIAGNOSIS — J90 Pleural effusion, not elsewhere classified: Secondary | ICD-10-CM | POA: Insufficient documentation

## 2017-12-12 DIAGNOSIS — C7951 Secondary malignant neoplasm of bone: Secondary | ICD-10-CM

## 2017-12-12 DIAGNOSIS — Z9221 Personal history of antineoplastic chemotherapy: Secondary | ICD-10-CM

## 2017-12-12 DIAGNOSIS — Z79899 Other long term (current) drug therapy: Secondary | ICD-10-CM | POA: Diagnosis not present

## 2017-12-12 DIAGNOSIS — C801 Malignant (primary) neoplasm, unspecified: Secondary | ICD-10-CM | POA: Diagnosis not present

## 2017-12-12 DIAGNOSIS — C50512 Malignant neoplasm of lower-outer quadrant of left female breast: Secondary | ICD-10-CM | POA: Diagnosis not present

## 2017-12-12 DIAGNOSIS — E7800 Pure hypercholesterolemia, unspecified: Secondary | ICD-10-CM | POA: Diagnosis not present

## 2017-12-12 DIAGNOSIS — Z23 Encounter for immunization: Secondary | ICD-10-CM | POA: Diagnosis not present

## 2017-12-12 DIAGNOSIS — I700 Atherosclerosis of aorta: Secondary | ICD-10-CM | POA: Diagnosis not present

## 2017-12-12 DIAGNOSIS — Z903 Acquired absence of stomach [part of]: Secondary | ICD-10-CM

## 2017-12-12 NOTE — Telephone Encounter (Signed)
Scheduled appt per 8/30 sch message - left message for patient with appt date and time.  

## 2017-12-12 NOTE — Progress Notes (Signed)
Pt presents today for initial consult with Dr. Lisbeth Renshaw for Radiation Oncology. Pt is accompanied by husband. Pt c/o pain in back, rib, and "in left pectoral chest muscle" all on left side. Pt recently took PRN pain medication and currently rates pain 6-7/10.  Prior Radiation:  No Pacemaker/ICD:  No Possible Pregnancy:  No, post-menopausal Taking Methotrexate:  No  BP (!) 155/92   Pulse 86   Temp (!) 97.5 F (36.4 C) (Oral)   Resp 18   Ht 5\' 4"  (1.626 m)   Wt 123 lb (55.8 kg)   SpO2 100%   BMI 21.11 kg/m   Wt Readings from Last 3 Encounters:  12/12/17 123 lb (55.8 kg)  12/12/17 123 lb 11.2 oz (56.1 kg)  12/11/17 122 lb (55.3 kg)   Loma Sousa, RN BSN

## 2017-12-12 NOTE — Assessment & Plan Note (Signed)
Left breast cancer invasive ductal carcinoma 2.5 cm in size grade 1, ER 99%, PR 41%, Ki-67 11%, HER-2 negative T2 N0 M0 stage II a status post neoadjuvant chemotherapy followed by surgery February 2011 and was on antiestrogen therapy with Femara from January 2011-July 2017  New onset mid back pain: MRI revealed T11 severe pathologic fracture, abnormal signal T10, T11 and T12, moderate canal stenosis at T11  12/02/2017: PET CT scan hypermetabolic lymphadenopathy in the thoracic and upper abdominal lymph nodes, spine and rib metastases, intramuscular metastases the left pectoralis muscle, T11 vertebral fracture pathologic  Biopsy of the T11 vertebral body was performed results are pending.  Tissue has been requested to be sent for prognostic panel, foundation 1, PDL 1  Treatment plan: 1.  Palliative radiation to T11 vertebral body and the pectoralis muscle.  I requested Dr. Lisbeth Renshaw to see the patient and he graciously agreed. 2. once a breast prognostic panel is available and if it is ER PR positive then she will go on Faslodex with Ibrance. 3.  We will schedule her for Faslodex injections along with Xgeva next week. I will see the patient along with 1 of her Faslodex injections so I can review the final treatment plan.  Pathologic fracture T11: I discussed with her that she may be referred for orthopedics for kyphoplasty if the pain is not under control.

## 2017-12-12 NOTE — Progress Notes (Signed)
Patient Care Team: [name] as PCP - General (Nurse Practitioner)  DIAGNOSIS:  Encounter Diagnosis  Name Primary?  . Malignant neoplasm of lower-outer quadrant of left breast of female, estrogen receptor positive ([place])     SUMMARY OF ONCOLOGIC HISTORY:   Breast cancer of lower-outer quadrant of left female breast ([place])   12/13/2008 Initial Diagnosis    Left breast biopsy: Invasive ductal carcinoma ER 90% percent, PR 41%, Ki-67 11%, HER-2 negative ratio 1, Oncotype DX score 23, ROR15%    01/27/2009 - 03/10/2009 Neo-Adjuvant Chemotherapy    Neoadjuvant FEC 4; participant in the "bed sheets" study.    05/12/2009 -  Anti-estrogen oral therapy    Femara 2.5 mg daily    05/22/2009 Surgery    Bilateral mastectomy stage IIB T2 N0 M0 IDC left breast grade 1, 2.5 cm, ER 99%, PR 41%, Ki-67 11%, HER-2 negative    12/10/2009 Surgery    Breast reconstruction surgery    11/14/2017 Relapse/Recurrence    Mid back pain: MRI revealed T11 severe pathologic fracture, abnormal signal T10, T11 and T12, moderate canal stenosis at T11    12/03/2017 PET scan    Hypermetabolic metastatic breast cancer involving thoracic and upper abdominal lymph nodes, spine and ribs. There may be hypermetabolic adenopathy in the left neck; Hypermetabolic lymph node versus intramuscular metastasis involving the medial left pectoralis musculature Pathologic T11 fracture       CHIEF COMPLIANT: Follow-up after recent PET CT scan and biopsy  INTERVAL HISTORY: Elizabeth [name] is a 62 year old with above-mentioned history of her newly diagnosed metastatic breast cancer who underwent a biopsy of T11 vertebral body and is here today to discuss her treatment plan.  She is in a lot of pain from the lower back.  She takes Percocets and Ultram but it does not seem to be helping her pain completely.  She is working full-time and appears to be in a lot of discomfort.  She is also complaining of left chest wall pain at the  pectoralis muscle.  REVIEW OF SYSTEMS:   Constitutional: Denies fevers, chills or abnormal weight loss Eyes: Denies blurriness of vision Ears, nose, mouth, throat, and face: Denies mucositis or sore throat Respiratory: Denies cough, dyspnea or wheezes Cardiovascular: Denies palpitation, chest discomfort Gastrointestinal:  Denies nausea, heartburn or change in bowel habits Skin: Denies abnormal skin rashes Lymphatics: Denies new lymphadenopathy or easy bruising Neurological:Denies numbness, tingling or new weaknesses Behavioral/Psych: Mood is stable, no new changes  Extremities: No lower extremity edema  All other systems were reviewed with the patient and are negative.  I have reviewed the past medical history, past surgical history, social history and family history with the patient and they are unchanged from previous note.  ALLERGIES:  is allergic to shellfish allergy and sulfa antibiotics.  MEDICATIONS:  Current Outpatient Medications  Medication Sig Dispense Refill  . Biotin 10000 MCG TABS Take 10,000 mcg by mouth daily.     . Cholecalciferol (VITAMIN D PO) Take 5,000 Units by mouth daily.     Marland Kitchen levothyroxine (SYNTHROID, LEVOTHROID) 25 MCG tablet Take 25 mcg by mouth daily before breakfast.     . Multiple Vitamin (MULTIVITAMIN) tablet Take 1 tablet by mouth daily.    . Omega-3 Fatty Acids (SUPER OMEGA 3 EPA/DHA PO) Take 1 capsule by mouth daily.    Marland Kitchen oxyCODONE-acetaminophen (PERCOCET/ROXICET) 5-325 MG tablet Take 1 tablet by mouth every 4 (four) hours as needed for severe pain. 30 tablet 0  . prednisoLONE acetate (  PRED FORTE) 1 % ophthalmic suspension Place 1 drop into both eyes daily.    . traMADol (ULTRAM) 50 MG tablet Take 1 tablet (50 mg total) by mouth every 6 (six) hours as needed. 30 tablet 0   No current facility-administered medications for this visit.     PHYSICAL EXAMINATION: ECOG PERFORMANCE STATUS: 1 - Symptomatic but completely ambulatory  Vitals:   12/12/17  1217  BP: (!) 155/92  Pulse: 86  Resp: 18  Temp: (!) 97.5 F (36.4 C)  SpO2: 100%   Filed Weights   12/12/17 1217  Weight: 123 lb 11.2 oz (56.1 kg)    GENERAL:alert, no distress and comfortable SKIN: skin color, texture, turgor are normal, no rashes or significant lesions EYES: normal, Conjunctiva are pink and non-injected, sclera clear OROPHARYNX:no exudate, no erythema and lips, buccal mucosa, and tongue normal  NECK: supple, thyroid normal size, non-tender, without nodularity LYMPH:  no palpable lymphadenopathy in the cervical, axillary or inguinal LUNGS: clear to auscultation and percussion with normal breathing effort HEART: regular rate & rhythm and no murmurs and no lower extremity edema ABDOMEN:abdomen soft, non-tender and normal bowel sounds MUSCULOSKELETAL:no cyanosis of digits and no clubbing  NEURO: alert & oriented x 3 with fluent speech, no focal motor/sensory deficits EXTREMITIES: No lower extremity edema   LABORATORY DATA:  I have reviewed the data as listed CMP Latest Ref Rng & Units 11/24/2017 05/27/2017 05/16/2017  Glucose 70 - 99 mg/dL 103(H) 261(H) 87  BUN 8 - 23 mg/dL '22 6 16  ' Creatinine 0.44 - 1.00 mg/dL 1.07(H) 0.74 0.81  Sodium 135 - 145 mmol/L 142 133(L) 141  Potassium 3.5 - 5.1 mmol/L 4.1 4.7 3.8  Chloride 98 - 111 mmol/L 105 101 104  CO2 22 - 32 mmol/L '26 23 27  ' Calcium 8.9 - 10.3 mg/dL 9.9 8.1(L) 9.5  Total Protein 6.5 - 8.1 g/dL 7.5 - -  Total Bilirubin 0.3 - 1.2 mg/dL 0.3 - -  Alkaline Phos 38 - 126 U/L 146(H) - -  AST 15 - 41 U/L 23 - -  ALT 0 - 44 U/L 11 - -    Lab Results  Component Value Date   WBC 4.1 12/11/2017   HGB 12.6 12/11/2017   HCT 38.9 12/11/2017   MCV 92.8 12/11/2017   PLT 385 12/11/2017   NEUTROABS 3.7 11/24/2017    ASSESSMENT & PLAN:  Breast cancer of lower-outer quadrant of left female breast Left breast cancer invasive ductal carcinoma 2.5 cm in size grade 1, ER 99%, PR 41%, Ki-67 11%, HER-2 negative T2 N0 M0 stage  II a status post neoadjuvant chemotherapy followed by surgery February 2011 and was on antiestrogen therapy with Femara from January 2011-July 2017  New onset mid back pain: MRI revealed T11 severe pathologic fracture, abnormal signal T10, T11 and T12, moderate canal stenosis at T11  12/02/2017: PET CT scan hypermetabolic lymphadenopathy in the thoracic and upper abdominal lymph nodes, spine and rib metastases, intramuscular metastases the left pectoralis muscle, T11 vertebral fracture pathologic  Biopsy of the T11 vertebral body was performed results are pending.  Tissue has been requested to be sent for prognostic panel, foundation 1, PDL 1  Treatment plan: 1.  Palliative radiation to T11 vertebral body and the pectoralis muscle.  I requested Dr. Lisbeth Renshaw to see the patient and he graciously agreed. 2. once a breast prognostic panel is available and if it is ER PR positive then she will go on Faslodex with Ibrance. 3.  We will schedule  her for Faslodex injections along with Xgeva next week. I will see the patient along with 1 of her Faslodex injections so I can review the final treatment plan.  Pathologic fracture T11: I discussed with her that she may be referred for orthopedics for kyphoplasty if the pain is not under control.       No orders of the defined types were placed in this encounter.  The patient has a good understanding of the overall plan. she agrees with it. she will call with any problems that may develop before the next visit here.   Harriette Ohara, MD 12/12/17

## 2017-12-16 ENCOUNTER — Ambulatory Visit: Payer: 59 | Admitting: Radiation Oncology

## 2017-12-16 ENCOUNTER — Ambulatory Visit: Payer: 59 | Admitting: Hematology and Oncology

## 2017-12-16 ENCOUNTER — Telehealth: Payer: Self-pay

## 2017-12-16 ENCOUNTER — Telehealth: Payer: Self-pay | Admitting: Hematology and Oncology

## 2017-12-16 ENCOUNTER — Other Ambulatory Visit: Payer: Self-pay | Admitting: Hematology and Oncology

## 2017-12-16 NOTE — Telephone Encounter (Signed)
Pt calling to rescheduling her injection appt in the afternoon, closer to her radiation time. Pt would like to also schedule an appt with Dr.Gudena towards the end of her radiation to discuss the next plan of treatment. Confirmed MD and new injection appt. Pt verbalized understanding and grateful for the call.

## 2017-12-16 NOTE — Telephone Encounter (Signed)
I informed the patient that the bone biopsy came back estrogen receptor positive. I recommended treatment with Faslodex with Brock Bad every 3 months Palbociclib will not be started until radiation is complete. She will be coming this Friday to receive her first set of injections.

## 2017-12-17 ENCOUNTER — Ambulatory Visit
Admission: RE | Admit: 2017-12-17 | Discharge: 2017-12-17 | Disposition: A | Discharge status: Home / Self Care | Payer: 59 | Source: Ambulatory Visit | Attending: Radiation Oncology | Admitting: Radiation Oncology

## 2017-12-17 DIAGNOSIS — E7800 Pure hypercholesterolemia, unspecified: Secondary | ICD-10-CM | POA: Diagnosis not present

## 2017-12-17 DIAGNOSIS — E039 Hypothyroidism, unspecified: Secondary | ICD-10-CM | POA: Diagnosis not present

## 2017-12-17 DIAGNOSIS — J90 Pleural effusion, not elsewhere classified: Secondary | ICD-10-CM | POA: Diagnosis not present

## 2017-12-17 DIAGNOSIS — G9050 Complex regional pain syndrome I, unspecified: Secondary | ICD-10-CM | POA: Diagnosis not present

## 2017-12-17 DIAGNOSIS — M129 Arthropathy, unspecified: Secondary | ICD-10-CM | POA: Diagnosis not present

## 2017-12-17 DIAGNOSIS — Z79899 Other long term (current) drug therapy: Secondary | ICD-10-CM | POA: Diagnosis not present

## 2017-12-17 DIAGNOSIS — C7951 Secondary malignant neoplasm of bone: Secondary | ICD-10-CM | POA: Diagnosis not present

## 2017-12-17 DIAGNOSIS — Z170 Estrogen receptor positive status [ER+]: Secondary | ICD-10-CM | POA: Diagnosis not present

## 2017-12-17 DIAGNOSIS — C50512 Malignant neoplasm of lower-outer quadrant of left female breast: Secondary | ICD-10-CM | POA: Diagnosis not present

## 2017-12-17 DIAGNOSIS — I700 Atherosclerosis of aorta: Secondary | ICD-10-CM | POA: Diagnosis not present

## 2017-12-17 DIAGNOSIS — Z9013 Acquired absence of bilateral breasts and nipples: Secondary | ICD-10-CM | POA: Diagnosis not present

## 2017-12-18 ENCOUNTER — Ambulatory Visit
Admission: RE | Admit: 2017-12-18 | Discharge: 2017-12-18 | Disposition: A | Discharge status: Home / Self Care | Payer: 59 | Source: Ambulatory Visit | Attending: Radiation Oncology | Admitting: Radiation Oncology

## 2017-12-18 DIAGNOSIS — M129 Arthropathy, unspecified: Secondary | ICD-10-CM | POA: Insufficient documentation

## 2017-12-18 DIAGNOSIS — G9050 Complex regional pain syndrome I, unspecified: Secondary | ICD-10-CM | POA: Insufficient documentation

## 2017-12-18 DIAGNOSIS — C7951 Secondary malignant neoplasm of bone: Secondary | ICD-10-CM | POA: Diagnosis not present

## 2017-12-18 DIAGNOSIS — I700 Atherosclerosis of aorta: Secondary | ICD-10-CM | POA: Insufficient documentation

## 2017-12-18 DIAGNOSIS — C50512 Malignant neoplasm of lower-outer quadrant of left female breast: Secondary | ICD-10-CM | POA: Insufficient documentation

## 2017-12-18 DIAGNOSIS — Z79899 Other long term (current) drug therapy: Secondary | ICD-10-CM | POA: Insufficient documentation

## 2017-12-18 DIAGNOSIS — E039 Hypothyroidism, unspecified: Secondary | ICD-10-CM | POA: Insufficient documentation

## 2017-12-18 DIAGNOSIS — J90 Pleural effusion, not elsewhere classified: Secondary | ICD-10-CM | POA: Insufficient documentation

## 2017-12-18 DIAGNOSIS — Z170 Estrogen receptor positive status [ER+]: Secondary | ICD-10-CM | POA: Insufficient documentation

## 2017-12-18 DIAGNOSIS — E7800 Pure hypercholesterolemia, unspecified: Secondary | ICD-10-CM | POA: Insufficient documentation

## 2017-12-18 DIAGNOSIS — Z9013 Acquired absence of bilateral breasts and nipples: Secondary | ICD-10-CM | POA: Insufficient documentation

## 2017-12-19 ENCOUNTER — Ambulatory Visit
Admission: RE | Admit: 2017-12-19 | Discharge: 2017-12-19 | Disposition: A | Discharge status: Home / Self Care | Payer: 59 | Source: Ambulatory Visit | Attending: Radiation Oncology | Admitting: Radiation Oncology

## 2017-12-19 ENCOUNTER — Telehealth: Payer: Self-pay

## 2017-12-19 ENCOUNTER — Other Ambulatory Visit: Payer: Self-pay | Admitting: Radiation Oncology

## 2017-12-19 ENCOUNTER — Ambulatory Visit: Payer: 59

## 2017-12-19 ENCOUNTER — Other Ambulatory Visit: Payer: Self-pay

## 2017-12-19 ENCOUNTER — Inpatient Hospital Stay: Payer: 59 | Attending: Hematology and Oncology

## 2017-12-19 ENCOUNTER — Other Ambulatory Visit: Payer: Self-pay | Admitting: Hematology and Oncology

## 2017-12-19 VITALS — BP 125/61 | HR 87 | Temp 97.8°F | Resp 16

## 2017-12-19 DIAGNOSIS — C7951 Secondary malignant neoplasm of bone: Secondary | ICD-10-CM | POA: Diagnosis not present

## 2017-12-19 DIAGNOSIS — Z170 Estrogen receptor positive status [ER+]: Principal | ICD-10-CM

## 2017-12-19 DIAGNOSIS — C50512 Malignant neoplasm of lower-outer quadrant of left female breast: Secondary | ICD-10-CM

## 2017-12-19 MED ORDER — ONDANSETRON HCL 8 MG PO TABS: 8.0000 mg | ORAL_TABLET | Freq: Three times a day (TID) | ORAL | 1 refills | Status: DC | PRN

## 2017-12-19 MED ORDER — ONDANSETRON HCL 8 MG PO TABS
8.0000 mg | ORAL_TABLET | Freq: Once | ORAL | Status: AC
  Administered 2017-12-19: 8 mg via ORAL

## 2017-12-19 MED ORDER — ONDANSETRON HCL 8 MG PO TABS: 8.0000 mg | ORAL_TABLET | Freq: Once | ORAL | 0 refills | Status: DC

## 2017-12-19 MED ORDER — FULVESTRANT 250 MG/5ML IM SOLN
500.0000 mg | Freq: Once | INTRAMUSCULAR | Status: AC
  Administered 2017-12-19: 500 mg via INTRAMUSCULAR

## 2017-12-19 MED ORDER — ONDANSETRON HCL 8 MG PO TABS: 8.0000 mg | ORAL_TABLET | Freq: Once | ORAL | Status: DC

## 2017-12-19 NOTE — Telephone Encounter (Signed)
Returned patient's call.  Patient having complications post radiation.  Inquiring if she needs to continue with injections that are to start today.  Left voicemail for patient to return call.

## 2017-12-19 NOTE — Telephone Encounter (Signed)
Patient returned call.  Per Dr. Lindi Adie we can hold Xgeva today d/t bone pain and nausea.  Continue with start date of Faslodex today.  Patient aware and voiced understanding.  Radiation staff will contact patient regarding radiation side effects.

## 2017-12-19 NOTE — Progress Notes (Unsigned)
Pt had nausea and lightheadedness while administering FASLODEX injection. This nurse had to wait before administering second injection. Applied cold cloth to neck and forehead. Was able to administer second injection. B/P 133/80 P 105 before injection. After injection B/P 101/55 P 72. Dr. Lindi Adie desk nurse was called and order was given per Dr. Satira Sark to administer Zofran 8 mg. B/P 125/61 after giving Zofran. Pt is now awaiting appt for radiation in lobby. Jakayden Cancio LPN

## 2017-12-19 NOTE — Progress Notes (Signed)
Injection nurse called to notify that patient was c/o nausea after injection.  BP prior to injection was 133/88, post injection 101/55.  Verbal orders given from Sandi Mealy, PA-C for Zofran 8mg  IV.  Orders placed, injection nurse notified.

## 2017-12-22 ENCOUNTER — Ambulatory Visit
Admission: RE | Admit: 2017-12-22 | Discharge: 2017-12-22 | Disposition: A | Discharge status: Home / Self Care | Payer: 59 | Source: Ambulatory Visit | Attending: Radiation Oncology | Admitting: Radiation Oncology

## 2017-12-22 DIAGNOSIS — C7951 Secondary malignant neoplasm of bone: Secondary | ICD-10-CM | POA: Diagnosis not present

## 2017-12-22 NOTE — Progress Notes (Signed)
  Radiation Oncology         [phone] ________________________________  Name: Elizabeth [name] MRN: [id number]  Date: 12/12/2017  DOB: [birth year]  SIMULATION AND TREATMENT PLANNING NOTE  DIAGNOSIS:     ICD-10-CM   1. Bone metastasis ([place]) C79.51      Site:   1.  Anterior left chest 2. T-spine  NARRATIVE:  The patient was brought to the [facility].  Identity was confirmed.  All relevant records and images related to the planned course of therapy were reviewed.   Written consent to proceed with treatment was confirmed which was freely given after reviewing the details related to the planned course of therapy had been reviewed with the patient.  Then, the patient was set-up in a stable reproducible  supine position for radiation therapy.  CT images were obtained.  Surface markings were placed.    Medically necessary treatment device(s) for immobilization:  Wing-board.   The CT images were loaded into the planning software.  Then the target and avoidance structures were contoured.  Treatment planning then occurred.  The radiation prescription was entered and confirmed.  A total of 5 complex treatment devices were fabricated which relate to the designed radiation treatment fields. Each of these customized fields/ complex treatment devices will be used on a daily basis during the radiation course. I have requested : 3D Simulation  I have requested a DVH of the following structures: Target volume, spinal cord, lungs.   PLAN:  The patient will receive 30 Gy in 10 fractions to each of the 2 target sites above.  ________________________________   Jodelle Gross, MD, PhD

## 2017-12-23 ENCOUNTER — Ambulatory Visit
Admission: RE | Admit: 2017-12-23 | Discharge: 2017-12-23 | Disposition: A | Discharge status: Home / Self Care | Payer: 59 | Source: Ambulatory Visit | Attending: Radiation Oncology | Admitting: Radiation Oncology

## 2017-12-23 DIAGNOSIS — C7951 Secondary malignant neoplasm of bone: Secondary | ICD-10-CM | POA: Diagnosis not present

## 2017-12-24 ENCOUNTER — Ambulatory Visit
Admission: RE | Admit: 2017-12-24 | Discharge: 2017-12-24 | Disposition: A | Discharge status: Home / Self Care | Payer: 59 | Source: Ambulatory Visit | Attending: Radiation Oncology | Admitting: Radiation Oncology

## 2017-12-24 DIAGNOSIS — C7951 Secondary malignant neoplasm of bone: Secondary | ICD-10-CM | POA: Diagnosis not present

## 2017-12-25 ENCOUNTER — Ambulatory Visit
Admission: RE | Admit: 2017-12-25 | Discharge: 2017-12-25 | Disposition: A | Discharge status: Home / Self Care | Payer: 59 | Source: Ambulatory Visit | Attending: Radiation Oncology | Admitting: Radiation Oncology

## 2017-12-25 ENCOUNTER — Other Ambulatory Visit: Payer: Self-pay

## 2017-12-25 ENCOUNTER — Telehealth: Payer: Self-pay

## 2017-12-25 DIAGNOSIS — C7951 Secondary malignant neoplasm of bone: Secondary | ICD-10-CM | POA: Diagnosis not present

## 2017-12-25 MED ORDER — OXYCODONE-ACETAMINOPHEN 5-325 MG PO TABS: 1.0000 | ORAL_TABLET | ORAL | 0 refills | Status: DC | PRN

## 2017-12-25 NOTE — Telephone Encounter (Signed)
Patient requesting refill on Oxycodone.  Last refill was 12/08/2017 for 5 day supply.  Verbal okay given by Dr. Lindi Adie for refill.  Script placed in Rx book for pick up.

## 2017-12-26 ENCOUNTER — Ambulatory Visit
Admission: RE | Admit: 2017-12-26 | Discharge: 2017-12-26 | Disposition: A | Discharge status: Home / Self Care | Payer: 59 | Source: Ambulatory Visit | Attending: Radiation Oncology | Admitting: Radiation Oncology

## 2017-12-26 DIAGNOSIS — C7951 Secondary malignant neoplasm of bone: Secondary | ICD-10-CM | POA: Diagnosis not present

## 2017-12-29 ENCOUNTER — Other Ambulatory Visit: Payer: Self-pay

## 2017-12-29 ENCOUNTER — Telehealth: Payer: Self-pay

## 2017-12-29 ENCOUNTER — Ambulatory Visit
Admission: RE | Admit: 2017-12-29 | Discharge: 2017-12-29 | Disposition: A | Discharge status: Home / Self Care | Payer: 59 | Source: Ambulatory Visit | Attending: Radiation Oncology | Admitting: Radiation Oncology

## 2017-12-29 DIAGNOSIS — C7951 Secondary malignant neoplasm of bone: Secondary | ICD-10-CM

## 2017-12-29 NOTE — Assessment & Plan Note (Deleted)
Left breast cancer invasive ductal carcinoma 2.5 cm in size grade 1, ER 99%, PR 41%, Ki-67 11%, HER-2 negative T2 N0 M0 stage II a status post neoadjuvant chemotherapy followed by surgery February 2011 and was on antiestrogen therapy with Femara from January 2011-July 2017  New onset mid back pain: MRI revealed T11 severe pathologic fracture, abnormal signal T10, T11 and T12, moderate canal stenosis at T11  12/02/2017: PET CT scan hypermetabolic lymphadenopathy in the thoracic and upper abdominal lymph nodes, spine and rib metastases, intramuscular metastases the left pectoralis muscle, T11 vertebral fracture pathologic  Biopsy of the T11 vertebral body was performed results are pending.  Tissue has been requested to be sent for prognostic panel (ER and PR Positive), foundation 1, PDL 1  Treatment plan: 1.  Palliative radiation to T11 vertebral body and the pectoralis muscle: 12/20/17-12/26/17 2.  Treatment Plan: Ibrance Faslodex injections along with Xgeva  Pathologic fracture T11: I discussed with her that she may be referred for orthopedics for kyphoplasty if the pain is not under control.

## 2017-12-29 NOTE — Telephone Encounter (Signed)
Referral faxed to Dr. Lynann Bologna at [facility].

## 2017-12-29 NOTE — Telephone Encounter (Signed)
Called pt regarding need to change appt time for 12/30/17. LVM on pt's work number with the two different time options available per Dr Lindi Adie.   No answer, no vm on pt's "home number". Per Dr Lindi Adie he is not available at 3:30 9/17 and appt needs to be changed to 1:15 or 4:45. LVM instructing pt to call back and let us know which time she can come in.

## 2017-12-30 ENCOUNTER — Telehealth: Payer: Self-pay | Admitting: *Deleted

## 2017-12-30 ENCOUNTER — Ambulatory Visit: Payer: 59

## 2017-12-30 ENCOUNTER — Inpatient Hospital Stay: Payer: 59 | Admitting: Hematology and Oncology

## 2017-12-30 NOTE — Telephone Encounter (Signed)
This RN spoke with pt per call ( note several calls between this RN and pt resulting in VMs ) at [phone].  Per call Mekia states she has had onset of severe nausea post eating lunch " and have been in and out of the bathroom "  " I need to reschedule my appointments "  She asked that we call in the morning to her home number regarding appointments.  Jordynne did not want to engage in long phone conversation due to " feeling like I am about to vomit "  This RN did ask if she has taken her zofran - with pt stating " no- I feel like if I swallow anything I will vomit "  This RN encouraged pt to take the zofran - which may help her current nausea.  Informed pt above request will be called to radiation as well as discuss with MD.  No further needs with the patient at this time.  This RN contacted Threasa Beards in [employer] and informed her of above.  Dr Lindi Adie made aware with request to reschedule appointment to later this week.

## 2017-12-30 NOTE — Progress Notes (Signed)
Radiation Oncology         [phone] ________________________________  Name: Elizabeth [name] MRN: [id number]  Date: 12/12/2017  DOB: June 19, 1955  SA:[id number], Barth Kirks, PA-C  Nicholas Lose, MD     REFERRING PHYSICIAN: Nicholas Lose, MD   DIAGNOSIS: The encounter diagnosis was Bone metastasis ([place]).   HISTORY OF PRESENT ILLNESS::Elizabeth [name] is a 62 y.o. female who is seen for an initial consultation visit regarding the patient's diagnosis of metastatic breast cancer.  The patient was originally diagnosed with breast cancer in 2011 and now has known stage IV disease.  The patient has been experiencing significant mid back pain and an MRI scan revealed a T11 pathologic fracture.  Biopsy of this vertebral body was positive for adenocarcinoma.  A PET scan also was completed on 12/02/2017 recently.  This showed multifocal disease including a significant additional area in the left pectoralis muscle which also is associated with pain currently.  I have therefore been asked to see the patient today for palliative radiation treatment to the T11 vertebral body and the left pectoralis muscle medially.    PREVIOUS RADIATION THERAPY: No   PAST MEDICAL HISTORY:  has a past medical history of Arthritis, Breast cancer ([place]) (2010), Chronic sinusitis, Family history of adverse reaction to anesthesia, History of chemotherapy (01/2009-03/2009), Hypercholesteremia, Hypothyroid, Migraines, and RSD (reflex sympathetic dystrophy).     PAST SURGICAL HISTORY: Past Surgical History:  Procedure Laterality Date  . BREAST RECONSTRUCTION    . EYE SURGERY Bilateral 7/18, 10/18  . KNEE ARTHROSCOPY Bilateral   . KNEE SURGERY Right    Two surgeries  . MASTECTOMY Bilateral 05/22/2009  . MASTECTOMY Bilateral 2011   restrictions on left arm-no labs/bloodpressure  . NASAL SINUS SURGERY    . PORTA CATH INSERTION  12/2008  . PORTA CATH REMOVAL  05/2009  . ROOT CANAL    . SHOULDER SURGERY Right   . TOTAL HIP  ARTHROPLASTY Left 05/26/2017   Procedure: TOTAL HIP ARTHROPLASTY ANTERIOR APPROACH;  Surgeon: Frederik Pear, MD;  Location: [facility];  Service: Orthopedics;  Laterality: Left;     FAMILY HISTORY: family history includes Hypertension in her mother.   SOCIAL HISTORY:  reports that she has never smoked. She has never used smokeless tobacco. She reports that she does not drink alcohol or use drugs.   ALLERGIES: Shellfish allergy and Sulfa antibiotics   MEDICATIONS:  Current Outpatient Medications  Medication Sig Dispense Refill  . Biotin 10000 MCG TABS Take 10,000 mcg by mouth daily.     . Cholecalciferol (VITAMIN D PO) Take 5,000 Units by mouth daily.     Marland Kitchen levothyroxine (SYNTHROID, LEVOTHROID) 25 MCG tablet Take 25 mcg by mouth daily before breakfast.     . Multiple Vitamin (MULTIVITAMIN) tablet Take 1 tablet by mouth daily.    . Omega-3 Fatty Acids (SUPER OMEGA 3 EPA/DHA PO) Take 1 capsule by mouth daily.    . prednisoLONE acetate (PRED FORTE) 1 % ophthalmic suspension Place 1 drop into both eyes daily.    . traMADol (ULTRAM) 50 MG tablet Take 1 tablet (50 mg total) by mouth every 6 (six) hours as needed. 30 tablet 0  . ondansetron (ZOFRAN) 8 MG tablet Take 1 tablet (8 mg total) by mouth every 8 (eight) hours as needed for nausea or vomiting. 30 tablet 1  . oxyCODONE-acetaminophen (PERCOCET/ROXICET) 5-325 MG tablet Take 1 tablet by mouth every 4 (four) hours as needed for severe pain. 30 tablet 0   No current facility-administered medications for  this encounter.      REVIEW OF SYSTEMS:  A 15 point review of systems is documented in the electronic medical record. This was obtained by the nursing staff. However, I reviewed this with the patient to discuss relevant findings and make appropriate changes.  Pertinent items are noted in HPI.    PHYSICAL EXAM:  height is 5\' 4"  (1.626 m) and weight is 123 lb (55.8 kg). Her oral temperature is 97.5 F (36.4 C) (abnormal). Her blood pressure is  155/92 (abnormal) and her pulse is 86. Her respiration is 18 and oxygen saturation is 100%.   ECOG = 1  0 - Asymptomatic (Fully active, able to carry on all predisease activities without restriction)  1 - Symptomatic but completely ambulatory (Restricted in physically strenuous activity but ambulatory and able to carry out work of a light or sedentary nature. For example, light housework, office work)  2 - Symptomatic, <50% in bed during the day (Ambulatory and capable of all self care but unable to carry out any work activities. Up and about more than 50% of waking hours)  3 - Symptomatic, >50% in bed, but not bedbound (Capable of only limited self-care, confined to bed or chair 50% or more of waking hours)  4 - Bedbound (Completely disabled. Cannot carry on any self-care. Totally confined to bed or chair)  5 - Death   Eustace Pen MM, Creech RH, Tormey DC, et al. [phone]). "Toxicity and response criteria of the [facility]". [place] Oncol. 5 (6): 649-55  Alert, no distress   LABORATORY DATA:  Lab Results  Component Value Date   WBC 4.1 12/11/2017   HGB 12.6 12/11/2017   HCT 38.9 12/11/2017   MCV 92.8 12/11/2017   PLT 385 12/11/2017   Lab Results  Component Value Date   NA 142 11/24/2017   K 4.1 11/24/2017   CL 105 11/24/2017   CO2 26 11/24/2017   Lab Results  Component Value Date   ALT 11 11/24/2017   AST 23 11/24/2017   ALKPHOS 146 (H) 11/24/2017   BILITOT 0.3 11/24/2017      RADIOGRAPHY: Nm Pet Image Initial (pi) Skull Base To Thigh  Result Date: 12/04/2017 CLINICAL DATA:  Initial treatment strategy for current left breast cancer. EXAM: NUCLEAR MEDICINE PET SKULL BASE TO THIGH TECHNIQUE: 6.1 mCi F-18 FDG was injected intravenously. Full-ring PET imaging was performed from the skull base to thigh after the radiotracer. CT data was obtained and used for attenuation correction and anatomic localization. Fasting blood glucose: 100 mg/dl COMPARISON:  MR  thoracic spine 11/14/2017. FINDINGS: Mediastinal blood pool activity: SUV max 2.2 NECK: Suspect a hypermetabolic high left level 2 lymph node with SUV max of 8.5. This area is incompletely imaged, however. No additional hypermetabolic lymph nodes in the neck. Incidental CT findings: None. CHEST: Hypermetabolic right supraclavicular lymph node measures 8 mm (CT image 23) with an SUV max of 7 1. Numerous hypermetabolic mediastinal lymph nodes are seen. Index low right paratracheal lymph node measuring 12 mm with an SUV max of 17.9. Hypermetabolic internal mammary adenopathy with an index 13 mm lymph node (CT image 45) and SUV max of 8 5. No hypermetabolic hilar or axillary lymph nodes. No hypermetabolic pulmonary nodules. Incidental CT findings: Atherosclerotic calcification of the arterial vasculature including coronary arteries. Heart is at the upper limits of in size. No pericardial effusion. Moderate simple appearing left pleural effusion with compressive atelectasis in the left lower lobe. ABDOMEN/PELVIS: 6 mm juxtadiaphragmatic lymph  node (CT image 77), SUV max 3.2. Left periaortic lymph nodes measure up to 7 mm (CT image 82) an SUV max of 4.1. No abnormal hypermetabolism in the liver, adrenal glands, spleen or pancreas. Incidental CT findings: Liver, gallbladder, adrenal glands, kidneys, spleen, pancreas, stomach and bowel are grossly unremarkable. SKELETON: Hypermetabolic lesions are seen in the spine and ribs. Index lytic lesions in the T11 vertebral body and posterior left eleventh rib have a collective SUV max of 11 4. Uptake within or deep to the medial left pectoralis musculature has an SUV max of 7.3 with a CT measurement of approximately 7 mm (CT image 35). Incidental CT findings: None. IMPRESSION: 1. Hypermetabolic metastatic breast cancer involving thoracic and upper abdominal lymph nodes, spine and ribs. There may be hypermetabolic adenopathy in the left neck, incompletely imaged. 2. Hypermetabolic  lymph node versus intramuscular metastasis involving the medial left pectoralis musculature. 3. Pathologic T11 fracture better visualized 11/14/2017. 4. Moderate left pleural effusion. 5.  Aortic atherosclerosis (ICD10-170.0). Electronically Signed   By: Lorin Picket M.D.   On: 12/04/2017 08:37   Ct Biopsy  Result Date: 12/11/2017 INDICATION: History of metastatic breast cancer, now with lytic lesion involving the T11 vertebral body. Additionally, there was a questioned, incompletely imaged hypermetabolic left cervical lymph node. Request made for biopsy for tissue diagnostic purposes. EXAM: CT GUIDED BIOPSY T11 LYTIC LESION COMPARISON:  PET-CT-12/03/2017 MEDICATIONS: None. ANESTHESIA/SEDATION: Fentanyl 150 mcg IV; Versed 3 mg IV Sedation time: 23 minutes; The patient was continuously monitored during the procedure by the interventional radiology nurse under my direct supervision. CONTRAST:  None. COMPLICATIONS: None immediate. PROCEDURE: Informed consent was obtained from the patient following an explanation of the procedure, risks, benefits and alternatives. A time out was performed prior to the initiation of the procedure. Initial ultrasound scanning was performed of the left neck and demonstrated a borderline enlarged though benign appearing left mid cervical lymph node. As I was uncertain whether this lymph node was reactive versus associated with the patient's suspected malignancy, the decision was made to proceed with CT-guided biopsy of the T11 lytic lesion for definitive tissue diagnostic purposes. As such, the patient was escorted to CT and positioned prone on the CT table and a limited CT was performed for procedural planning demonstrating unchanged appearance of mixed though predominantly lytic lesion involving the left side of the T11 vertebral body with extension to involve both the posterior elements as well as the posteromedial aspect of the adjacent eleventh rib. The procedure was planned.  The operative site was prepped and draped in the usual sterile fashion. Appropriate trajectory was confirmed with a 22 gauge spinal needle after the adjacent tissues were anesthetized with 1% Lidocaine with epinephrine. Under intermittent CT guidance, a 17 gauge coaxial needle was advanced into the peripheral aspect of the mass. Appropriate positioning was confirmed and 3 core needle biopsy samples were obtained with an 18 gauge core needle biopsy device. The co-axial needle was removed and hemostasis was achieved with manual compression. Given lack of substantial lesional tissue, an 11 gauge bone biopsy device was advanced into the posterior lateral aspect of the lytic T11 vertebral body under intermittent fluoroscopic guidance. Appropriate position was confirmed and initially a biopsy was obtained with the inner 13 gauge bone biopsy device followed by the acquisition of an additional sample with the outer 11 gauge bone biopsy device. The bone biopsy device was removed and superficial hemostasis was achieved manual compression. A limited postprocedural CT was negative for hemorrhage or additional complication.  A dressing was placed. The patient tolerated the procedure well without immediate postprocedural complication. IMPRESSION: Technically successful CT guided core needle biopsy of T11 lytic lesion. Electronically Signed   By: Sandi Mariscal M.D.   On: 12/11/2017 11:09       IMPRESSION:  The patient is a good candidate for palliative radiation treatment for her diagnosis of metastatic breast cancer.  We discussed treating both the lower thoracic spine, with an area at T11 in particular, as well as an area of metastasis in the left pectoralis muscle medially.  The patient has some associated pain in both of these areas.  We discussed an approximate 2-week course of treatment and the patient is interested in proceeding with treatment as soon as possible.   PLAN: The patient will proceed with CT simulation  today for treatment planning.  I anticipate treating the patient to the above to target regions to a dose of 30 Gy in 10 fractions.    ________________________________   Jodelle Gross, MD, PhD   **Disclaimer: This note was dictated with voice recognition software. Similar sounding words can inadvertently be transcribed and this note may contain transcription errors which may not have been corrected upon publication of note.**

## 2017-12-31 ENCOUNTER — Ambulatory Visit
Admission: RE | Admit: 2017-12-31 | Discharge: 2017-12-31 | Disposition: A | Discharge status: Home / Self Care | Payer: 59 | Source: Ambulatory Visit | Attending: Radiation Oncology | Admitting: Radiation Oncology

## 2017-12-31 DIAGNOSIS — C7951 Secondary malignant neoplasm of bone: Secondary | ICD-10-CM | POA: Diagnosis not present

## 2018-01-02 ENCOUNTER — Telehealth: Payer: Self-pay | Admitting: Hematology and Oncology

## 2018-01-02 NOTE — Telephone Encounter (Signed)
Appt scheduled LMVM for patient with date/time per 9/20 sch msg

## 2018-01-09 DIAGNOSIS — M545 Low back pain: Secondary | ICD-10-CM | POA: Diagnosis not present

## 2018-01-12 ENCOUNTER — Inpatient Hospital Stay (HOSPITAL_BASED_OUTPATIENT_CLINIC_OR_DEPARTMENT_OTHER): Payer: 59 | Admitting: Hematology and Oncology

## 2018-01-12 DIAGNOSIS — C7951 Secondary malignant neoplasm of bone: Secondary | ICD-10-CM

## 2018-01-12 DIAGNOSIS — Z170 Estrogen receptor positive status [ER+]: Secondary | ICD-10-CM | POA: Insufficient documentation

## 2018-01-12 DIAGNOSIS — C50512 Malignant neoplasm of lower-outer quadrant of left female breast: Secondary | ICD-10-CM | POA: Insufficient documentation

## 2018-01-12 MED ORDER — LETROZOLE 2.5 MG PO TABS: 2.5000 mg | ORAL_TABLET | Freq: Every day | ORAL | 3 refills | Status: DC

## 2018-01-12 MED ORDER — PALBOCICLIB 125 MG PO CAPS: 125.0000 mg | ORAL_CAPSULE | Freq: Every day | ORAL | 3 refills | Status: DC

## 2018-01-12 MED ORDER — OXYCODONE-ACETAMINOPHEN 5-325 MG PO TABS: 1.0000 | ORAL_TABLET | Freq: Three times a day (TID) | ORAL | 0 refills | Status: DC | PRN

## 2018-01-12 NOTE — Progress Notes (Signed)
Patient Care Team: [name] as PCP - General (Nurse Practitioner)  DIAGNOSIS:  Encounter Diagnosis  Name Primary?  . Malignant neoplasm of lower-outer quadrant of left breast of female, estrogen receptor positive ([place])     SUMMARY OF ONCOLOGIC HISTORY:   Breast cancer of lower-outer quadrant of left female breast ([place])   12/13/2008 Initial Diagnosis    Left breast biopsy: Invasive ductal carcinoma ER 90% percent, PR 41%, Ki-67 11%, HER-2 negative ratio 1, Oncotype DX score 23, ROR15%    01/27/2009 - 03/10/2009 Neo-Adjuvant Chemotherapy    Neoadjuvant FEC 4; participant in the "bed sheets" study.    05/12/2009 -  Anti-estrogen oral therapy    Femara 2.5 mg daily    05/22/2009 Surgery    Bilateral mastectomy stage IIB T2 N0 M0 IDC left breast grade 1, 2.5 cm, ER 99%, PR 41%, Ki-67 11%, HER-2 negative    12/10/2009 Surgery    Breast reconstruction surgery    11/14/2017 Relapse/Recurrence    Mid back pain: MRI revealed T11 severe pathologic fracture, abnormal signal T10, T11 and T12, moderate canal stenosis at T11    12/03/2017 PET scan    Hypermetabolic metastatic breast cancer involving thoracic and upper abdominal lymph nodes, spine and ribs. There may be hypermetabolic adenopathy in the left neck; Hypermetabolic lymph node versus intramuscular metastasis involving the medial left pectoralis musculature Pathologic T11 fracture      12/11/2017 Procedure    Biopsy of T11 vertebral body lytic lesion: Metastatic breast adenocarcinoma ER positive, PR negative    12/19/2017 - 12/26/2017 Radiation Therapy    Palliative XRT to T 11    01/12/2018 -  Anti-estrogen oral therapy    Patient could not tolerate Faslodex because of severe hypotension and severe pain in the legs after injections.  Ibrance with letrozole starting 01/12/2018     CHIEF COMPLIANT: Inability to tolerate Faslodex.  Patient is here to discuss treatment plan since radiation is complete  INTERVAL HISTORY:  Elizabeth [name] is a 61 year old with above-mentioned history of metastatic breast cancer is here to discuss her treatment options.  She completed palliative radiation therapy to T11 and is doing much better regarding that.  She received 1 dose of Faslodex and had severe hypotension as well as pain and discomfort in the legs.  She does not want to receive that anymore. Her pain is under reasonable control but it wakes her up at night and keeps her awake.  She needs refill on her medications and she is requiring more pain medication.  REVIEW OF SYSTEMS:   Constitutional: Denies fevers, chills or abnormal weight loss Eyes: Denies blurriness of vision Ears, nose, mouth, throat, and face: Denies mucositis or sore throat Respiratory: Denies cough, dyspnea or wheezes Cardiovascular: Denies palpitation, chest discomfort Gastrointestinal:  Denies nausea, heartburn or change in bowel habits Skin: Denies abnormal skin rashes Lymphatics: Denies new lymphadenopathy or easy bruising Neurological:Denies numbness, tingling or new weaknesses Behavioral/Psych: Mood is stable, no new changes  Extremities: No lower extremity edema Breast: Back pain and chest wall pain All other systems were reviewed with the patient and are negative.  I have reviewed the past medical history, past surgical history, social history and family history with the patient and they are unchanged from previous note.  ALLERGIES:  is allergic to shellfish allergy and sulfa antibiotics.  MEDICATIONS:  Current Outpatient Medications  Medication Sig Dispense Refill  . Biotin 10000 MCG TABS Take 10,000 mcg by mouth daily.     Marland Kitchen  Cholecalciferol (VITAMIN D PO) Take 5,000 Units by mouth daily.     Marland Kitchen letrozole (FEMARA) 2.5 MG tablet Take 1 tablet (2.5 mg total) by mouth daily. 90 tablet 3  . levothyroxine (SYNTHROID, LEVOTHROID) 25 MCG tablet Take 25 mcg by mouth daily before breakfast.     . Multiple Vitamin (MULTIVITAMIN) tablet Take 1  tablet by mouth daily.    . Omega-3 Fatty Acids (SUPER OMEGA 3 EPA/DHA PO) Take 1 capsule by mouth daily.    . ondansetron (ZOFRAN) 8 MG tablet Take 1 tablet (8 mg total) by mouth every 8 (eight) hours as needed for nausea or vomiting. 30 tablet 1  . oxyCODONE-acetaminophen (PERCOCET/ROXICET) 5-325 MG tablet Take 1 tablet by mouth every 8 (eight) hours as needed for severe pain. 60 tablet 0  . palbociclib (IBRANCE) 125 MG capsule Take 1 capsule (125 mg total) by mouth daily with breakfast. Take whole with food. Take for 21 days on, 7 days off, repeat every 28 days. 21 capsule 3  . prednisoLONE acetate (PRED FORTE) 1 % ophthalmic suspension Place 1 drop into both eyes daily.    . traMADol (ULTRAM) 50 MG tablet Take 1 tablet (50 mg total) by mouth every 6 (six) hours as needed. 30 tablet 0   No current facility-administered medications for this visit.     PHYSICAL EXAMINATION: ECOG PERFORMANCE STATUS: 1 - Symptomatic but completely ambulatory  Vitals:   01/12/18 1531  BP: 122/88  Pulse: (!) 103  Resp: 17  Temp: 98.5 F (36.9 C)  SpO2: 100%   Filed Weights   01/12/18 1531  Weight: 122 lb 12.8 oz (55.7 kg)    GENERAL:alert, no distress and comfortable SKIN: skin color, texture, turgor are normal, no rashes or significant lesions EYES: normal, Conjunctiva are pink and non-injected, sclera clear OROPHARYNX:no exudate, no erythema and lips, buccal mucosa, and tongue normal  NECK: supple, thyroid normal size, non-tender, without nodularity LYMPH:  no palpable lymphadenopathy in the cervical, axillary or inguinal LUNGS: clear to auscultation and percussion with normal breathing effort HEART: regular rate & rhythm and no murmurs and no lower extremity edema ABDOMEN:abdomen soft, non-tender and normal bowel sounds MUSCULOSKELETAL:no cyanosis of digits and no clubbing  NEURO: alert & oriented x 3 with fluent speech, no focal motor/sensory deficits EXTREMITIES: No lower extremity edema   LABORATORY DATA:  I have reviewed the data as listed CMP Latest Ref Rng & Units 11/24/2017 05/27/2017 05/16/2017  Glucose 70 - 99 mg/dL 103(H) 261(H) 87  BUN 8 - 23 mg/dL _0 Creatinine 0.44 - 1.00 mg/dL 1.07(H) 0.74 0.81  Sodium 135 - 145 mmol/L 142 133(L) 141  Potassium 3.5 - 5.1 mmol/L 4.1 4.7 3.8  Chloride 98 - 111 mmol/L 105 101 104  CO2 22 - 32 mmol/L _1 Calcium 8.9 - 10.3 mg/dL 9.9 8.1(L) 9.5  Total Protein 6.5 - 8.1 g/dL 7.5 - -  Total Bilirubin 0.3 - 1.2 mg/dL 0.3 - -  Alkaline Phos 38 - 126 U/L 146(H) - -  AST 15 - 41 U/L 23 - -  ALT 0 - 44 U/L 11 - -    Lab Results  Component Value Date   WBC 4.1 12/11/2017   HGB 12.6 12/11/2017   HCT 38.9 12/11/2017   MCV 92.8 12/11/2017   PLT 385 12/11/2017   NEUTROABS 3.7 11/24/2017    ASSESSMENT & PLAN:  Breast cancer of lower-outer quadrant of left female breast Left breast cancer invasive ductal carcinoma 2.5  cm in size grade 1, ER 99%, PR 41%, Ki-67 11%, HER-2 negative T2 N0 M0 stage II a status post neoadjuvant chemotherapy followed by surgery February 2011 and was on antiestrogen therapy with Femara from January 2011-July 2017  New onset mid back pain: MRI revealed T11 severe pathologic fracture, abnormal signal T10, T11 and T12, moderate canal stenosis at T11  12/02/2017: PET CT scan hypermetabolic lymphadenopathy in the thoracic and upper abdominal lymph nodes, spine and rib metastases, intramuscular metastases the left pectoralis muscle, T11 vertebral fracture pathologic  Biopsy of the T11 vertebral body was performed results show ER PR positive.      Treatment plan: 1.  Palliative radiation to T11 vertebral body and the pectoralis muscle: 12/20/17-12/26/17 2.  Treatment Plan: Ibrance with letrozole along with Delton See or Zometa Patient could not tolerate Faslodex because of intense bone pain as well as hypotension.  Pathologic fracture T11: She met with orthopedics regarding kyphoplasty and they determined that  there is no surgical options available for her.  Return to clinic in 2 weeks with labs and follow-up to assess tolerability to Ibrance.   No orders of the defined types were placed in this encounter.  The patient has a good understanding of the overall plan. she agrees with it. she will call with any problems that may develop before the next visit here.   Harriette Ohara, MD 01/12/18

## 2018-01-12 NOTE — Assessment & Plan Note (Signed)
Left breast cancer invasive ductal carcinoma 2.5 cm in size grade 1, ER 99%, PR 41%, Ki-67 11%, HER-2 negative T2 N0 M0 stage II a status post neoadjuvant chemotherapy followed by surgery February 2011 and was on antiestrogen therapy with Femara from January 2011-July 2017  New onset mid back pain: MRI revealed T11 severe pathologic fracture, abnormal signal T10, T11 and T12, moderate canal stenosis at T11  12/02/2017: PET CT scan hypermetabolic lymphadenopathy in the thoracic and upper abdominal lymph nodes, spine and rib metastases, intramuscular metastases the left pectoralis muscle, T11 vertebral fracture pathologic  Biopsy of the T11 vertebral body was performed results are pending.  Tissue has been requested to be sent for prognostic panel (ER and PR Positive), foundation 1, PDL 1  Treatment plan: 1.  Palliative radiation to T11 vertebral body and the pectoralis muscle: 12/20/17-12/26/17 2.  Treatment Plan: Ibrance with letrozole along with Delton See or Zometa Patient could not tolerate Faslodex because of intense bone pain as well as hypotension.  Pathologic fracture T11: She met with orthopedics regarding kyphoplasty and they determined that there is no surgical options available for her.  Return to clinic in 2 weeks with labs and follow-up to assess tolerability to Ibrance.

## 2018-01-13 ENCOUNTER — Telehealth: Payer: Self-pay | Admitting: Hematology and Oncology

## 2018-01-13 NOTE — Telephone Encounter (Signed)
Scheduled appt per 10/1 sch message - left message for patient with appt date and time.

## 2018-01-14 ENCOUNTER — Telehealth: Payer: Self-pay

## 2018-01-14 ENCOUNTER — Telehealth: Payer: Self-pay | Admitting: Pharmacist

## 2018-01-14 DIAGNOSIS — C50512 Malignant neoplasm of lower-outer quadrant of left female breast: Secondary | ICD-10-CM

## 2018-01-14 DIAGNOSIS — Z170 Estrogen receptor positive status [ER+]: Principal | ICD-10-CM

## 2018-01-14 NOTE — Progress Notes (Signed)
FMLA successfully faxed to Kerney Elbe at [place] at [phone]. Mailed copy to patient address on file.

## 2018-01-14 NOTE — Telephone Encounter (Signed)
Oral Oncology Pharmacist Encounter  Received new prescription for Ibrance (palbociclib) for the treatment of hormone-receptor positive, metastatic breast cancer in conjunction with Femara, planned duration until disease progression or unacceptable toxicity.  Labs from 11/24/17 assessed, OK for treatment. Noted SCr=1.07, est CrCl ~ 45 mL/min, no dose adjustment recommended by manufacturer for renal dysfunction  Current medication list in Epic reviewed, no significant DDIs with [name] identified:  Prescription has been e-scribed to the [employer] for benefits analysis and approval on 01/12/18 by MD. [facility] is not in network for patient's prescription insurance coverage. Patient must use [facility] per insurance requirement.  Insurance authorization will be submitted. Ibrance prescription wil be e-scribed to [facility] once authorization is obtained from [facility].  Oral Oncology Clinic will continue to follow for insurance authorization, copayment issues, initial counseling and start date.  Johny Drilling, PharmD, BCPS, BCOP  01/14/2018 8:32 AM Oral Oncology Clinic [phone]

## 2018-01-14 NOTE — Telephone Encounter (Signed)
Oral Oncology Patient Advocate Encounter  Received notification from [employer] that prior authorization for Elizabeth [name] is required.  PA submitted online at [email or web address]  I tried to use Covermymeds and it instructed me to call [phone] and they gave me the website to sumbit the PA at.  Status is pending  Oral Oncology Clinic will continue to follow.  [place] Patient [place] Phone [phone] Fax [phone]

## 2018-01-15 ENCOUNTER — Telehealth: Payer: Self-pay | Admitting: Pharmacist

## 2018-01-15 NOTE — Telephone Encounter (Signed)
Oral Oncology Pharmacist Encounter  Insurance authorization for Elizabeth [name] has been denied. Letter of medical necessity has been completed and signed by Dr. Lindi Adie. Patient's insurance requires patient signs form to a point the office to appeal this denial on her request. This form has also been signed by Dr. Lindi Adie.  Once appeal packet is completed it will be faxed to [place] attention: Rx benefits appeals at [phone] Appeal packet will be marked as urgent.  Left voicemail with patient to return call to oral oncology patient advocate so that we can discuss insurance appeal process and coordinate a time for signature on the appointee form.  We will plan to dispense Ibrance samples to patient at that time.  Johny Drilling, PharmD, BCPS, BCOP  01/15/2018 2:21 PM Oral Oncology Clinic [phone]

## 2018-01-16 MED ORDER — PALBOCICLIB 125 MG PO CAPS: 125.0000 mg | ORAL_CAPSULE | Freq: Every day | ORAL | 3 refills | Status: DC

## 2018-01-16 NOTE — Telephone Encounter (Signed)
Oral Chemotherapy Pharmacist Encounter   I spoke with patient for overview of: Ibrance (palbociclib) for the treatment of hormone-receptor positive, metastatic breast cancer in conjunction with Femara, planned duration until disease progression or unacceptable toxicity.   Counseled patient on administration, dosing, side effects, monitoring, drug-food interactions, safe handling, storage, and disposal.  Patient will take Ibrance 125mg  capsules, 1 capsule by mouth once daily with breakfast for 3 weeks on, 1 week off.  Patient knows to avoid grapefruit and grapefruit juice.  Patient is taking Femara.  Ibrance start date: 01/16/2018  Adverse effects include but are not limited to: fatigue, hair loss, GI upset, nausea, decreased blood counts, and increased upper respiratory infections. Patient will obtain anti diarrheal and alert the office of 4 or more loose stools above baseline.  Patient endorses continued low-grade nausea that still continues even after completion of radiation course on 12/31/2017. Patient states that she has to snack throughout the day and keep a little something in her stomach at all times or else the nausea worsens. We discussed that this may be an effective preventative measure as she gets started on her Ibrance.  Patient reminded of WBC check on Cycle 1 Day 14 for dose and ANC assessment.  Reviewed with patient importance of keeping a medication schedule and plan for any missed doses.  Mrs. Antilla voiced understanding and appreciation.   All questions answered. Medication reconciliation performed and medication/allergy list updated.  Patient informed that insurance authorization for [name] has been approved. [name] must be dispensed at [place] per insurance requirement. Ibrance prescription has been E scribed to appropriate dispensing pharmacy.  Patient planning to come to the office this afternoon to pick up Ibrance sample so that she can  go on to get started. She will take her first dose of Ibrance today (01/16/2018) with food as soon as she leaves the office. She will take her second dose of Ibrance tomorrow with lunch. She will start breakfast dosing on Sunday. We discussed that Fridays will be her start day of each cycle.  Patient knows to call the office with questions or concerns. Oral Oncology Clinic will continue to follow.  Johny Drilling, PharmD, BCPS, BCOP  01/16/2018   11:18 AM Oral Oncology Clinic [phone]

## 2018-01-16 NOTE — Telephone Encounter (Signed)
Oral Oncology Patient Advocate Encounter  PA was denied. Appeal has been faxed. Patient will come in today, 10/4 to get samples.  [place] Patient [place] Phone [phone] Fax [phone]

## 2018-01-16 NOTE — Telephone Encounter (Signed)
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 Patient is planned to come to the office this afternoon to pick up samples of Ibrance to get started and to sign waiver form to allow the office to appeal to her insurance company on her behalf  Dispensed samples to patient:  Medication: Ibrance 125 mg capsules Instructions: Take 1 capsule (125 mg) by mouth once daily with food.  Take for 21 days on, 7 days off, repeat every 28 days. Quantity dispensed: 21 Days supply: 28 Manufacturer: Pfizer Lot: [id number] Exp: April 2020  I will provide initial face-to-face counseling when patient is in the office this afternoon to pick up her samples.  Johny Drilling, PharmD, BCPS, BCOP  01/16/2018 9:10 AM Oral Oncology Clinic [phone]

## 2018-01-19 ENCOUNTER — Telehealth: Payer: Self-pay

## 2018-01-19 NOTE — Telephone Encounter (Signed)
Oral Oncology Patient Advocate Encounter  I received an email from [place] stating that the [place] is $250. I was able to get a copay card, the information is as follows and has been shared with [facility].  BIN: [id number] Group: [id number] ID: [id number]  [place] Patient [place] Phone [phone] Fax [phone]

## 2018-01-19 NOTE — Telephone Encounter (Signed)
Oral Oncology Patient Advocate Encounter  Prior Authorization for [name] has been approved.    PA# [id number] Effective dates: 01/15/18 through 01/16/19  Oral Oncology Clinic will continue to follow.   [place] Patient [place] Phone [phone] Fax [phone]

## 2018-01-27 ENCOUNTER — Other Ambulatory Visit: Payer: Self-pay

## 2018-01-27 ENCOUNTER — Telehealth: Payer: Self-pay | Admitting: Radiation Oncology

## 2018-01-27 DIAGNOSIS — C50512 Malignant neoplasm of lower-outer quadrant of left female breast: Secondary | ICD-10-CM

## 2018-01-27 DIAGNOSIS — Z170 Estrogen receptor positive status [ER+]: Secondary | ICD-10-CM

## 2018-01-27 DIAGNOSIS — C7951 Secondary malignant neoplasm of bone: Secondary | ICD-10-CM

## 2018-01-27 NOTE — Telephone Encounter (Signed)
Patient states that she is doing fine and would like to cancel the 1 month follow up appointment that was scheduled for 01/28/2018 at 1:30 with Bryson Ha. Patient would like to be followed by Dr. Lindi Adie she has an appointment to see him on 01/28/2018. If she need to see [place] for any reason she will call to schedule with our office.

## 2018-01-28 ENCOUNTER — Inpatient Hospital Stay: Payer: 59

## 2018-01-28 ENCOUNTER — Inpatient Hospital Stay: Payer: 59 | Attending: Hematology and Oncology | Admitting: Hematology and Oncology

## 2018-01-28 ENCOUNTER — Ambulatory Visit: Admission: RE | Admit: 2018-01-28 | Payer: 59 | Source: Ambulatory Visit | Admitting: Radiation Oncology

## 2018-01-28 ENCOUNTER — Telehealth: Payer: Self-pay | Admitting: Radiation Oncology

## 2018-01-28 DIAGNOSIS — C50512 Malignant neoplasm of lower-outer quadrant of left female breast: Secondary | ICD-10-CM

## 2018-01-28 DIAGNOSIS — Z170 Estrogen receptor positive status [ER+]: Secondary | ICD-10-CM | POA: Insufficient documentation

## 2018-01-28 DIAGNOSIS — C7951 Secondary malignant neoplasm of bone: Secondary | ICD-10-CM | POA: Insufficient documentation

## 2018-01-28 LAB — CMP (CANCER CENTER ONLY)
ALBUMIN: 3.7 g/dL (ref 3.5–5.0)
ALK PHOS: 103 U/L (ref 38–126)
ALT: 18 U/L (ref 0–44)
AST: 23 U/L (ref 15–41)
Anion gap: 10 (ref 5–15)
BILIRUBIN TOTAL: 0.3 mg/dL (ref 0.3–1.2)
BUN: 20 mg/dL (ref 8–23)
CO2: 26 mmol/L (ref 22–32)
CREATININE: 1.56 mg/dL — AB (ref 0.44–1.00)
Calcium: 9.6 mg/dL (ref 8.9–10.3)
Chloride: 105 mmol/L (ref 98–111)
GFR, EST NON AFRICAN AMERICAN: 34 mL/min — AB (ref >60)
GFR, Est AFR Am: 40 mL/min — ABNORMAL LOW (ref >60)
GLUCOSE: 99 mg/dL (ref 70–99)
Potassium: 4.5 mmol/L (ref 3.5–5.1)
Sodium: 141 mmol/L (ref 135–145)
TOTAL PROTEIN: 7 g/dL (ref 6.5–8.1)

## 2018-01-28 LAB — CBC WITH DIFFERENTIAL (CANCER CENTER ONLY)
ABS IMMATURE GRANULOCYTES: 0.02 10*3/uL (ref 0.00–0.07)
BASOS PCT: 1 %
Basophils Absolute: 0 10*3/uL (ref 0.0–0.1)
Eosinophils Absolute: 0 10*3/uL (ref 0.0–0.5)
Eosinophils Relative: 3 %
HEMATOCRIT: 33.9 % — AB (ref 36.0–46.0)
HEMOGLOBIN: 11.1 g/dL — AB (ref 12.0–15.0)
IMMATURE GRANULOCYTES: 1 %
LYMPHS PCT: 27 %
Lymphs Abs: 0.4 10*3/uL — ABNORMAL LOW (ref 0.7–4.0)
MCH: 30.8 pg (ref 26.0–34.0)
MCHC: 32.7 g/dL (ref 30.0–36.0)
MCV: 94.2 fL (ref 80.0–100.0)
MONOS PCT: 14 %
Monocytes Absolute: 0.2 10*3/uL (ref 0.1–1.0)
NEUTROS ABS: 0.8 10*3/uL — AB (ref 1.7–7.7)
NEUTROS PCT: 54 %
Platelet Count: 235 10*3/uL (ref 150–400)
RBC: 3.6 MIL/uL — ABNORMAL LOW (ref 3.87–5.11)
RDW: 13.2 % (ref 11.5–15.5)
WBC Count: 1.4 10*3/uL — ABNORMAL LOW (ref 4.0–10.5)
nRBC: 0 % (ref 0.0–0.2)

## 2018-01-28 NOTE — Assessment & Plan Note (Signed)
Left breast cancer invasive ductal carcinoma 2.5 cm in size grade 1, ER 99%, PR 41%, Ki-67 11%, HER-2 negative T2 N0 M0 stage II a status post neoadjuvant chemotherapy followed by surgery February 2011 and was on antiestrogen therapy with Femara from January 2011-July 2017  New onset mid back pain: MRI revealed T11 severe pathologic fracture, abnormal signal T10, T11 and T12, moderate canal stenosis at T11  12/02/2017: PET CT scan hypermetabolic lymphadenopathy in the thoracic and upper abdominal lymph nodes, spine and rib metastases, intramuscular metastases the left pectoralis muscle, T11 vertebral fracture pathologic  Biopsy of the T11 vertebral body was performed results are pending.  Tissue has been requested to be sent for prognostic panel (ER and PR Positive), foundation 1, PDL 1  Treatment plan: 1.  Palliative radiation to T11 vertebral body and the pectoralis muscle: 12/20/17-12/26/17 2.  Treatment Plan: Ibrance with letrozole along with Xgeva or Zometa Patient could not tolerate Faslodex because of intense bone pain as well as hypotension.  Pathologic fracture T11: She met with orthopedics regarding kyphoplasty and they determined that there is no surgical options available for her.  Return to clinic in 2 weeks with labs and follow-up to assess tolerability to Ibrance.    

## 2018-01-28 NOTE — Telephone Encounter (Signed)
LM for pt to call back if she had questions or would like to review considerations following radiation. I did reveal parameters to call and skin considerations.

## 2018-01-28 NOTE — Progress Notes (Signed)
Patient Care Team: [name] as PCP - General (Nurse Practitioner)  DIAGNOSIS:  Encounter Diagnosis  Name Primary?  . Malignant neoplasm of lower-outer quadrant of left breast of female, estrogen receptor positive ([place])     SUMMARY OF ONCOLOGIC HISTORY:   Breast cancer of lower-outer quadrant of left female breast ([place])   12/13/2008 Initial Diagnosis    Left breast biopsy: Invasive ductal carcinoma ER 90% percent, PR 41%, Ki-67 11%, HER-2 negative ratio 1, Oncotype DX score 23, ROR15%    01/27/2009 - 03/10/2009 Neo-Adjuvant Chemotherapy    Neoadjuvant FEC 4; participant in the "bed sheets" study.    05/12/2009 -  Anti-estrogen oral therapy    Femara 2.5 mg daily    05/22/2009 Surgery    Bilateral mastectomy stage IIB T2 N0 M0 IDC left breast grade 1, 2.5 cm, ER 99%, PR 41%, Ki-67 11%, HER-2 negative    12/10/2009 Surgery    Breast reconstruction surgery    11/14/2017 Relapse/Recurrence    Mid back pain: MRI revealed T11 severe pathologic fracture, abnormal signal T10, T11 and T12, moderate canal stenosis at T11    12/03/2017 PET scan    Hypermetabolic metastatic breast cancer involving thoracic and upper abdominal lymph nodes, spine and ribs. There may be hypermetabolic adenopathy in the left neck; Hypermetabolic lymph node versus intramuscular metastasis involving the medial left pectoralis musculature Pathologic T11 fracture      12/11/2017 Procedure    Biopsy of T11 vertebral body lytic lesion: Metastatic breast adenocarcinoma ER positive, PR negative    12/19/2017 - 12/26/2017 Radiation Therapy    Palliative XRT to T 11    01/12/2018 -  Anti-estrogen oral therapy    Patient could not tolerate Faslodex because of severe hypotension and severe pain in the legs after injections.  Ibrance with letrozole starting 01/12/2018     CHIEF COMPLIANT: Follow-up on Ibrance  INTERVAL HISTORY: KEELIA GRAYBILL is a 62 year old with above-mentioned history metastatic breast cancer  currently on Ibrance with letrozole.  She has been tolerating Ibrance fairly well she has very minimal nausea.  She is here for two-week follow-up on Ibrance.  Today her [place] is only 800.  She denies any fevers or chills.  REVIEW OF SYSTEMS:   Constitutional: Denies fevers, chills or abnormal weight loss Eyes: Denies blurriness of vision Ears, nose, mouth, throat, and face: Denies mucositis or sore throat Respiratory: Denies cough, dyspnea or wheezes Cardiovascular: Denies palpitation, chest discomfort Gastrointestinal:  Denies nausea, heartburn or change in bowel habits Skin: Denies abnormal skin rashes Lymphatics: Denies new lymphadenopathy or easy bruising Neurological:Denies numbness, tingling or new weaknesses Behavioral/Psych: Mood is stable, no new changes  Extremities: No lower extremity edema Breast:  denies any pain or lumps or nodules in either breasts All other systems were reviewed with the patient and are negative.  I have reviewed the past medical history, past surgical history, social history and family history with the patient and they are unchanged from previous note.  ALLERGIES:  is allergic to shellfish allergy and sulfa antibiotics.  MEDICATIONS:  Current Outpatient Medications  Medication Sig Dispense Refill  . Biotin 10000 MCG TABS Take 10,000 mcg by mouth daily.     . Cholecalciferol (VITAMIN D PO) Take 5,000 Units by mouth daily.     Marland Kitchen letrozole (FEMARA) 2.5 MG tablet Take 1 tablet (2.5 mg total) by mouth daily. 90 tablet 3  . levothyroxine (SYNTHROID, LEVOTHROID) 25 MCG tablet Take 25 mcg by mouth daily before breakfast.     .  Multiple Vitamin (MULTIVITAMIN) tablet Take 1 tablet by mouth daily.    . Omega-3 Fatty Acids (SUPER OMEGA 3 EPA/DHA PO) Take 1 capsule by mouth daily.    . ondansetron (ZOFRAN) 8 MG tablet Take 1 tablet (8 mg total) by mouth every 8 (eight) hours as needed for nausea or vomiting. 30 tablet 1  . oxyCODONE-acetaminophen (PERCOCET/ROXICET)  5-325 MG tablet Take 1 tablet by mouth every 8 (eight) hours as needed for severe pain. 60 tablet 0  . palbociclib (IBRANCE) 125 MG capsule Take 1 capsule (125 mg total) by mouth daily with breakfast. Take whole with food. Take for 21 days on, 7 days off, repeat every 28 days. 21 capsule 3  . prednisoLONE acetate (PRED FORTE) 1 % ophthalmic suspension Place 1 drop into both eyes daily.    . traMADol (ULTRAM) 50 MG tablet Take 1 tablet (50 mg total) by mouth every 6 (six) hours as needed. 30 tablet 0   No current facility-administered medications for this visit.     PHYSICAL EXAMINATION: ECOG PERFORMANCE STATUS: 1 - Symptomatic but completely ambulatory  Vitals:   01/28/18 1549  BP: 126/76  Pulse: 97  Resp: 19  Temp: 99 F (37.2 C)  SpO2: 99%   Filed Weights   01/28/18 1549  Weight: 122 lb 1.6 oz (55.4 kg)    GENERAL:alert, no distress and comfortable SKIN: skin color, texture, turgor are normal, no rashes or significant lesions EYES: normal, Conjunctiva are pink and non-injected, sclera clear OROPHARYNX:no exudate, no erythema and lips, buccal mucosa, and tongue normal  NECK: supple, thyroid normal size, non-tender, without nodularity LYMPH:  no palpable lymphadenopathy in the cervical, axillary or inguinal LUNGS: clear to auscultation and percussion with normal breathing effort HEART: regular rate & rhythm and no murmurs and no lower extremity edema ABDOMEN:abdomen soft, non-tender and normal bowel sounds MUSCULOSKELETAL:no cyanosis of digits and no clubbing  NEURO: alert & oriented x 3 with fluent speech, no focal motor/sensory deficits EXTREMITIES: No lower extremity edema   LABORATORY DATA:  I have reviewed the data as listed CMP Latest Ref Rng & Units 01/28/2018 11/24/2017 05/27/2017  Glucose 70 - 99 mg/dL 99 103(H) 261(H)  BUN 8 - 23 mg/dL _0 Creatinine 0.44 - 1.00 mg/dL 1.56(H) 1.07(H) 0.74  Sodium 135 - 145 mmol/L 141 142 133(L)  Potassium 3.5 - 5.1 mmol/L 4.5  4.1 4.7  Chloride 98 - 111 mmol/L 105 105 101  CO2 22 - 32 mmol/L _1 Calcium 8.9 - 10.3 mg/dL 9.6 9.9 8.1(L)  Total Protein 6.5 - 8.1 g/dL 7.0 7.5 -  Total Bilirubin 0.3 - 1.2 mg/dL 0.3 0.3 -  Alkaline Phos 38 - 126 U/L 103 146(H) -  AST 15 - 41 U/L 23 23 -  ALT 0 - 44 U/L 18 11 -    Lab Results  Component Value Date   WBC 1.4 (L) 01/28/2018   HGB 11.1 (L) 01/28/2018   HCT 33.9 (L) 01/28/2018   MCV 94.2 01/28/2018   PLT 235 01/28/2018   NEUTROABS 0.8 (L) 01/28/2018    ASSESSMENT & PLAN:  Breast cancer of lower-outer quadrant of left female breast Left breast cancer invasive ductal carcinoma 2.5 cm in size grade 1, ER 99%, PR 41%, Ki-67 11%, HER-2 negative T2 N0 M0 stage II a status post neoadjuvant chemotherapy followed by surgery February 2011 and was on antiestrogen therapy with Femara from January 2011-July 2017  New onset mid back pain: MRI revealed T11 severe  pathologic fracture, abnormal signal T10, T11 and T12, moderate canal stenosis at T11  12/02/2017: PET CT scan hypermetabolic lymphadenopathy in the thoracic and upper abdominal lymph nodes, spine and rib metastases, intramuscular metastases the left pectoralis muscle, T11 vertebral fracture pathologic  Biopsy of the T11 vertebral body was performed results are pending.  Tissue has been requested to be sent for prognostic panel (ER and PR Positive), foundation 1, PDL 1 Pathologic fracture T11: She met with orthopedics regarding kyphoplasty and they determined that there is no surgical options available for her.  Treatment  : 1.  Palliative radiation to T11 vertebral body and the pectoralis muscle: 12/20/17-12/26/17 2.  Treatment Plan: Ibrance with letrozole along with Delton See or Zometa Patient could not tolerate Faslodex because of intense bone pain as well as hypotension.  Ibrance toxicities: 1.  ANC 800: I recommended holding Ibrance for a week and coming back next week for recheck. If her [place] is over 1000 she can  resume the full dose. It is very likely that we may have to reduce the dosage of the next cycle of Ibrance.    Return to clinic in 1 weeks with labs and follow-up    Orders Placed This Encounter  Procedures  . CBC with Differential (Cancer Center Only)    Standing Status:   Standing    Number of Occurrences:   30    Standing Expiration Date:   01/29/2019   The patient has a good understanding of the overall plan. she agrees with it. she will call with any problems that may develop before the next visit here.   Harriette Ohara, MD 01/28/18

## 2018-01-29 NOTE — Telephone Encounter (Signed)
Oral Oncology Pharmacist Encounter  Prior Authorization for Elizabeth [name] has now been been approved.    PA# [id number] Effective dates: 01/15/18 through 01/16/19  Johny Drilling, PharmD, BCPS, BCOP  01/29/2018 10:27 AM Oral Oncology Clinic [phone]

## 2018-02-04 ENCOUNTER — Other Ambulatory Visit: Payer: Self-pay | Admitting: *Deleted

## 2018-02-04 ENCOUNTER — Inpatient Hospital Stay: Payer: 59

## 2018-02-04 ENCOUNTER — Telehealth: Payer: Self-pay | Admitting: Hematology and Oncology

## 2018-02-04 ENCOUNTER — Inpatient Hospital Stay (HOSPITAL_BASED_OUTPATIENT_CLINIC_OR_DEPARTMENT_OTHER): Payer: 59 | Admitting: Hematology and Oncology

## 2018-02-04 VITALS — BP 135/84 | HR 106 | Temp 98.1°F | Resp 18 | Ht 64.0 in | Wt 122.2 lb

## 2018-02-04 DIAGNOSIS — Z9221 Personal history of antineoplastic chemotherapy: Secondary | ICD-10-CM | POA: Diagnosis not present

## 2018-02-04 DIAGNOSIS — D701 Agranulocytosis secondary to cancer chemotherapy: Secondary | ICD-10-CM

## 2018-02-04 DIAGNOSIS — Z170 Estrogen receptor positive status [ER+]: Principal | ICD-10-CM

## 2018-02-04 DIAGNOSIS — T733XXA Exhaustion due to excessive exertion, initial encounter: Secondary | ICD-10-CM

## 2018-02-04 DIAGNOSIS — C50512 Malignant neoplasm of lower-outer quadrant of left female breast: Secondary | ICD-10-CM | POA: Diagnosis not present

## 2018-02-04 DIAGNOSIS — C7951 Secondary malignant neoplasm of bone: Secondary | ICD-10-CM

## 2018-02-04 DIAGNOSIS — R5383 Other fatigue: Secondary | ICD-10-CM

## 2018-02-04 LAB — CBC WITH DIFFERENTIAL (CANCER CENTER ONLY)
Abs Immature Granulocytes: 0 10*3/uL (ref 0.00–0.07)
BASOS ABS: 0 10*3/uL (ref 0.0–0.1)
BASOS PCT: 2 %
EOS ABS: 0 10*3/uL (ref 0.0–0.5)
EOS PCT: 3 %
HCT: 33.4 % — ABNORMAL LOW (ref 36.0–46.0)
Hemoglobin: 11.2 g/dL — ABNORMAL LOW (ref 12.0–15.0)
Immature Granulocytes: 0 %
LYMPHS PCT: 29 %
Lymphs Abs: 0.4 10*3/uL — ABNORMAL LOW (ref 0.7–4.0)
MCH: 31.4 pg (ref 26.0–34.0)
MCHC: 33.5 g/dL (ref 30.0–36.0)
MCV: 93.6 fL (ref 80.0–100.0)
MONO ABS: 0.3 10*3/uL (ref 0.1–1.0)
Monocytes Relative: 24 %
NRBC: 0 % (ref 0.0–0.2)
Neutro Abs: 0.5 10*3/uL — ABNORMAL LOW (ref 1.7–7.7)
Neutrophils Relative %: 42 %
Platelet Count: 127 10*3/uL — ABNORMAL LOW (ref 150–400)
RBC: 3.57 MIL/uL — AB (ref 3.87–5.11)
RDW: 13.6 % (ref 11.5–15.5)
WBC: 1.2 10*3/uL — AB (ref 4.0–10.5)

## 2018-02-04 LAB — VITAMIN B12: VITAMIN B 12: 479 pg/mL (ref 180–914)

## 2018-02-04 MED ORDER — PALBOCICLIB 100 MG PO CAPS: 100.0000 mg | ORAL_CAPSULE | Freq: Every day | ORAL | 0 refills | Status: DC

## 2018-02-04 NOTE — Assessment & Plan Note (Signed)
Left breast cancer invasive ductal carcinoma 2.5 cm in size grade 1, ER 99%, PR 41%, Ki-67 11%, HER-2 negative T2 N0 M0 stage II a status post neoadjuvant chemotherapy followed by surgery February 2011and wason antiestrogen therapy with Femara from January 2011-July 2017  New onset mid back pain: MRI revealedT11 severe pathologic fracture, abnormal signal T10, T11 and T12, moderate canal stenosis at T11  12/02/2017: PET CT scan hypermetabolic lymphadenopathy in the thoracic and upper abdominal lymph nodes, spine and rib metastases, intramuscular metastases the left pectoralis muscle, T11 vertebral fracture pathologic  Biopsy of the T11 vertebral body was performed results are pending.  Tissue has been requested to be sent for prognostic panel (ER and PR Positive), foundation 1, PDL 1 Pathologic fracture T11: She met with orthopedics regarding kyphoplasty and they determined that there is no surgical options available for her. ------------------------------------------------------------------- Treatment  : 1.  Palliative radiation to T11 vertebral body and the pectoralis muscle: 12/20/17-12/26/17 2.   current treatment: Ibrance with letrozole along with Delton See or Zometa Patient could not tolerate Faslodex because of intense bone pain as well as hypotension.  Ibrance toxicities: 1.  ANC:      Return to clinic in 2 weeks with labs and follow-up

## 2018-02-04 NOTE — Telephone Encounter (Signed)
Per 10/23 patient decline avs and calendar

## 2018-02-04 NOTE — Progress Notes (Signed)
Patient Care Team: [name] as PCP - General (Nurse Practitioner)  DIAGNOSIS:  Encounter Diagnoses  Name Primary?  . Malignant neoplasm of lower-outer quadrant of left breast of female, estrogen receptor positive ([place])   . Fatigue due to excessive exertion, initial encounter Yes    SUMMARY OF ONCOLOGIC HISTORY:   Breast cancer of lower-outer quadrant of left female breast ([place])   12/13/2008 Initial Diagnosis    Left breast biopsy: Invasive ductal carcinoma ER 90% percent, PR 41%, Ki-67 11%, HER-2 negative ratio 1, Oncotype DX score 23, ROR15%    01/27/2009 - 03/10/2009 Neo-Adjuvant Chemotherapy    Neoadjuvant FEC 4; participant in the "bed sheets" study.    05/12/2009 -  Anti-estrogen oral therapy    Femara 2.5 mg daily    05/22/2009 Surgery    Bilateral mastectomy stage IIB T2 N0 M0 IDC left breast grade 1, 2.5 cm, ER 99%, PR 41%, Ki-67 11%, HER-2 negative    12/10/2009 Surgery    Breast reconstruction surgery    11/14/2017 Relapse/Recurrence    Mid back pain: MRI revealed T11 severe pathologic fracture, abnormal signal T10, T11 and T12, moderate canal stenosis at T11    12/03/2017 PET scan    Hypermetabolic metastatic breast cancer involving thoracic and upper abdominal lymph nodes, spine and ribs. There may be hypermetabolic adenopathy in the left neck; Hypermetabolic lymph node versus intramuscular metastasis involving the medial left pectoralis musculature Pathologic T11 fracture      12/11/2017 Procedure    Biopsy of T11 vertebral body lytic lesion: Metastatic breast adenocarcinoma ER positive, PR negative    12/19/2017 - 12/26/2017 Radiation Therapy    Palliative XRT to T 11    01/12/2018 -  Anti-estrogen oral therapy    Patient could not tolerate Faslodex because of severe hypotension and severe pain in the legs after injections.  Ibrance with letrozole starting 01/12/2018     CHIEF COMPLIANT: Follow-up to review blood work for [employer]  INTERVAL HISTORY:  Elizabeth [name] is a 62 year old with above-mentioned history of metastatic breast cancer currently on Ibrance with letrozole.  She was neutropenic last week and we held [place].  She is here today to recheck her blood work.  It appears that her [place] today is only 400.  So she will continue to hold off on taking Ibrance.  She does not have any fevers or chills.  She continues to have back pain issues. She is applying for [employer] and disability.  I believe she does qualify for these benefits because her inability to work are markedly impaired due to metastatic breast cancer.  REVIEW OF SYSTEMS:   Constitutional: Profound fatigue to the point that she has difficulty with activities of daily living. Eyes: Denies blurriness of vision Ears, nose, mouth, throat, and face: Denies mucositis or sore throat Respiratory: Shortness of breath to minimal exertion probably due to muscle fatigue Cardiovascular: Denies palpitation, chest discomfort Gastrointestinal: Slight nausea Skin: Denies abnormal skin rashes Lymphatics: Denies new lymphadenopathy or easy bruising Neurological: Upper and lower extremity weakness and chronic back pain Behavioral/Psych: Depression Extremities: No lower extremity edema  All other systems were reviewed with the patient and are negative.  I have reviewed the past medical history, past surgical history, social history and family history with the patient and they are unchanged from previous note.  ALLERGIES:  is allergic to shellfish allergy and sulfa antibiotics.  MEDICATIONS:  Current Outpatient Medications  Medication Sig Dispense Refill  . Biotin 10000 MCG TABS Take  10,000 mcg by mouth daily.     . Cholecalciferol (VITAMIN D PO) Take 5,000 Units by mouth daily.     Marland Kitchen letrozole (FEMARA) 2.5 MG tablet Take 1 tablet (2.5 mg total) by mouth daily. 90 tablet 3  . levothyroxine (SYNTHROID, LEVOTHROID) 25 MCG tablet Take 25 mcg by mouth daily before breakfast.     .  Multiple Vitamin (MULTIVITAMIN) tablet Take 1 tablet by mouth daily.    . Omega-3 Fatty Acids (SUPER OMEGA 3 EPA/DHA PO) Take 1 capsule by mouth daily.    . ondansetron (ZOFRAN) 8 MG tablet Take 1 tablet (8 mg total) by mouth every 8 (eight) hours as needed for nausea or vomiting. 30 tablet 1  . oxyCODONE-acetaminophen (PERCOCET/ROXICET) 5-325 MG tablet Take 1 tablet by mouth every 8 (eight) hours as needed for severe pain. 60 tablet 0  . palbociclib (IBRANCE) 100 MG capsule Take 1 capsule (100 mg total) by mouth daily with breakfast. Take whole with food. Take for 21 days on, 7 days off, repeat every 28 days. 21 capsule 0  . prednisoLONE acetate (PRED FORTE) 1 % ophthalmic suspension Place 1 drop into both eyes daily.    . traMADol (ULTRAM) 50 MG tablet Take 1 tablet (50 mg total) by mouth every 6 (six) hours as needed. 30 tablet 0   No current facility-administered medications for this visit.     PHYSICAL EXAMINATION: ECOG PERFORMANCE STATUS: 2 - Symptomatic, <50% confined to bed  Vitals:   02/04/18 0914  BP: 135/84  Pulse: (!) 106  Resp: 18  Temp: 98.1 F (36.7 C)  SpO2: 98%   Filed Weights   02/04/18 0914  Weight: 122 lb 3.2 oz (55.4 kg)    GENERAL:alert, no distress and comfortable SKIN: skin color, texture, turgor are normal, no rashes or significant lesions EYES: normal, Conjunctiva are pink and non-injected, sclera clear OROPHARYNX:no exudate, no erythema and lips, buccal mucosa, and tongue normal  NECK: supple, thyroid normal size, non-tender, without nodularity LYMPH:  no palpable lymphadenopathy in the cervical, axillary or inguinal LUNGS: clear to auscultation and percussion with normal breathing effort HEART: regular rate & rhythm and no murmurs and no lower extremity edema ABDOMEN:abdomen soft, non-tender and normal bowel sounds MUSCULOSKELETAL:no cyanosis of digits and no clubbing  NEURO: alert & oriented x 3 with fluent speech, generalized weakness and  fatigue. EXTREMITIES: No lower extremity edema   LABORATORY DATA:  I have reviewed the data as listed CMP Latest Ref Rng & Units 01/28/2018 11/24/2017 05/27/2017  Glucose 70 - 99 mg/dL 99 103(H) 261(H)  BUN 8 - 23 mg/dL '20 22 6  ' Creatinine 0.44 - 1.00 mg/dL 1.56(H) 1.07(H) 0.74  Sodium 135 - 145 mmol/L 141 142 133(L)  Potassium 3.5 - 5.1 mmol/L 4.5 4.1 4.7  Chloride 98 - 111 mmol/L 105 105 101  CO2 22 - 32 mmol/L '26 26 23  ' Calcium 8.9 - 10.3 mg/dL 9.6 9.9 8.1(L)  Total Protein 6.5 - 8.1 g/dL 7.0 7.5 -  Total Bilirubin 0.3 - 1.2 mg/dL 0.3 0.3 -  Alkaline Phos 38 - 126 U/L 103 146(H) -  AST 15 - 41 U/L 23 23 -  ALT 0 - 44 U/L 18 11 -    Lab Results  Component Value Date   WBC 1.2 (L) 02/04/2018   HGB 11.2 (L) 02/04/2018   HCT 33.4 (L) 02/04/2018   MCV 93.6 02/04/2018   PLT 127 (L) 02/04/2018   NEUTROABS 0.5 (L) 02/04/2018    ASSESSMENT & PLAN:  Breast cancer of lower-outer quadrant of left female breast Left breast cancer invasive ductal carcinoma 2.5 cm in size grade 1, ER 99%, PR 41%, Ki-67 11%, HER-2 negative T2 N0 M0 stage II a status post neoadjuvant chemotherapy followed by surgery February 2011and wason antiestrogen therapy with Femara from January 2011-July 2017  New onset mid back pain: MRI revealedT11 severe pathologic fracture, abnormal signal T10, T11 and T12, moderate canal stenosis at T11  12/02/2017: PET CT scan hypermetabolic lymphadenopathy in the thoracic and upper abdominal lymph nodes, spine and rib metastases, intramuscular metastases the left pectoralis muscle, T11 vertebral fracture pathologic  Biopsy of the T11 vertebral body was performed results are pending.  Tissue has been requested to be sent for prognostic panel (ER and PR Positive), foundation 1, PDL 1 Pathologic fracture T11: She met with orthopedics regarding kyphoplasty and they determined that there is no surgical options available for  her. ------------------------------------------------------------------- Treatment  : 1.  Palliative radiation to T11 vertebral body and the pectoralis muscle: 12/20/17-12/26/17 2.   current treatment: Ibrance with letrozole along with Delton See or Zometa Patient could not tolerate Faslodex because of intense bone pain as well as hypotension.  Ibrance toxicities: 1.  ANC: 400.  I instructed her to discontinue the current dose of Ibrance.    We will check her back in 2 weeks and if her [place] is more than 1000 then we will resume at a lower dosage of 100 mg. I sent a new prescription to her pharmacy. 2. severe profound fatigue: She is been having difficulty with performing her activities of daily living. She also has chronic back pain which is causing her significant discomfort.  She will request Social Security and disability application. Return to clinic in 2 weeks with labs and follow-up     Orders Placed This Encounter  Procedures  . Vitamin B12    Standing Status:   Future    Number of Occurrences:   1    Standing Expiration Date:   02/04/2019  . Vitamin D 25 hydroxy    Standing Status:   Future    Number of Occurrences:   1    Standing Expiration Date:   02/04/2019   The patient has a good understanding of the overall plan. she agrees with it. she will call with any problems that may develop before the next visit here.   Harriette Ohara, MD 02/04/18

## 2018-02-05 LAB — VITAMIN D 25 HYDROXY (VIT D DEFICIENCY, FRACTURES): Vit D, 25-Hydroxy: 64.4 ng/mL (ref 30.0–100.0)

## 2018-02-06 ENCOUNTER — Telehealth: Payer: Self-pay

## 2018-02-06 NOTE — Telephone Encounter (Signed)
Oral Oncology Patient Advocate Encounter  Received notification from [facility] that prior authorization for Ibrance 100mg  is required.  PA submitted on [email or web address]  Status is pending  Oral Oncology Clinic will continue to follow.  [place] Patient [place] Phone [phone] Fax [phone]

## 2018-02-09 NOTE — Telephone Encounter (Signed)
Oral Oncology Patient Advocate Encounter  Prior Authorization for Elizabeth [name] has been approved.    PA# [id number] Effective dates: 02/06/2018 through 02/07/2019  Oral Oncology Clinic will continue to follow.   Elizabeth [name] Phone [phone] Fax [phone]

## 2018-02-13 ENCOUNTER — Other Ambulatory Visit: Payer: Self-pay | Admitting: Hematology and Oncology

## 2018-02-13 ENCOUNTER — Encounter: Payer: Self-pay | Admitting: Radiation Oncology

## 2018-02-13 NOTE — Progress Notes (Signed)
  Radiation Oncology         [phone] ________________________________  Name: MAHALIE KANNER MRN: [id number]  Date: 02/13/2018  DOB: June 25, 1955  End of Treatment Note  Diagnosis:   Bone metastasis     Indication for treatment::  palliative       Radiation treatment dates:   12/17/2017 - 12/30/2017  Site/dose:   Anterior left chest and T11 received 30 Gy in 10 fractions with a Complex Isodose Plan.   Narrative: The patient tolerated radiation treatment relatively well.  She experienced improved pain in her chest, but reported increased pain in her back.  Plan: The patient has completed radiation treatment. The patient will return to radiation oncology clinic for routine followup in one month. I advised the patient to call or return sooner if they have any questions or concerns related to their recovery or treatment. ________________________________  Jodelle Gross, M.D., Ph.D.   This document serves as a record of services personally performed by Kyung Rudd, MD. It was created on his behalf by Wilburn Mylar, a trained medical scribe. The creation of this record is based on the scribe's personal observations and the provider's statements to them. This document has been checked and approved by the attending provider.

## 2018-02-13 NOTE — Telephone Encounter (Signed)
Oral Oncology Patient Advocate Encounter  Confirmed with [place] and Mrs. Poupard that  [name] will be delivered to the patient today 02/13/18.  [place] Patient [place] Phone [phone] Fax [phone]

## 2018-02-18 ENCOUNTER — Inpatient Hospital Stay (HOSPITAL_BASED_OUTPATIENT_CLINIC_OR_DEPARTMENT_OTHER): Payer: 59 | Admitting: Adult Health

## 2018-02-18 ENCOUNTER — Inpatient Hospital Stay: Payer: 59 | Attending: Hematology and Oncology

## 2018-02-18 ENCOUNTER — Encounter: Payer: Self-pay | Admitting: Adult Health

## 2018-02-18 ENCOUNTER — Telehealth: Payer: Self-pay

## 2018-02-18 ENCOUNTER — Telehealth: Payer: Self-pay | Admitting: Adult Health

## 2018-02-18 VITALS — BP 137/89 | HR 109 | Temp 98.0°F | Resp 18 | Ht 64.0 in | Wt 122.0 lb

## 2018-02-18 DIAGNOSIS — C7951 Secondary malignant neoplasm of bone: Secondary | ICD-10-CM

## 2018-02-18 DIAGNOSIS — C50512 Malignant neoplasm of lower-outer quadrant of left female breast: Secondary | ICD-10-CM | POA: Insufficient documentation

## 2018-02-18 DIAGNOSIS — Z79811 Long term (current) use of aromatase inhibitors: Secondary | ICD-10-CM

## 2018-02-18 DIAGNOSIS — I959 Hypotension, unspecified: Secondary | ICD-10-CM | POA: Diagnosis not present

## 2018-02-18 DIAGNOSIS — Z170 Estrogen receptor positive status [ER+]: Secondary | ICD-10-CM

## 2018-02-18 LAB — CBC WITH DIFFERENTIAL (CANCER CENTER ONLY)
Abs Immature Granulocytes: 0.01 10*3/uL (ref 0.00–0.07)
Basophils Absolute: 0.1 10*3/uL (ref 0.0–0.1)
Basophils Relative: 2 %
Eosinophils Absolute: 0.1 10*3/uL (ref 0.0–0.5)
Eosinophils Relative: 4 %
HEMATOCRIT: 38.3 % (ref 36.0–46.0)
Hemoglobin: 12.3 g/dL (ref 12.0–15.0)
IMMATURE GRANULOCYTES: 0 %
LYMPHS ABS: 0.7 10*3/uL (ref 0.7–4.0)
LYMPHS PCT: 21 %
MCH: 31.1 pg (ref 26.0–34.0)
MCHC: 32.1 g/dL (ref 30.0–36.0)
MCV: 96.7 fL (ref 80.0–100.0)
MONOS PCT: 19 %
Monocytes Absolute: 0.6 10*3/uL (ref 0.1–1.0)
NEUTROS ABS: 1.7 10*3/uL (ref 1.7–7.7)
NEUTROS PCT: 54 %
NRBC: 0 % (ref 0.0–0.2)
Platelet Count: 535 10*3/uL — ABNORMAL HIGH (ref 150–400)
RBC: 3.96 MIL/uL (ref 3.87–5.11)
RDW: 14.7 % (ref 11.5–15.5)
WBC Count: 3.2 10*3/uL — ABNORMAL LOW (ref 4.0–10.5)

## 2018-02-18 NOTE — Progress Notes (Signed)
[facility] Follow up:    Redmon, [name], PA-C [address] [place] [address]   DIAGNOSIS: Cancer Staging No matching staging information was found for the patient.  SUMMARY OF ONCOLOGIC HISTORY:   Breast cancer of lower-outer quadrant of left female breast ([facility])   12/13/2008 Initial Diagnosis    Left breast biopsy: Invasive ductal carcinoma ER 90% percent, PR 41%, Ki-67 11%, HER-2 negative ratio 1, Oncotype DX score 23, ROR15%    01/27/2009 - 03/10/2009 Neo-Adjuvant Chemotherapy    Neoadjuvant FEC 4; participant in the "bed sheets" study.    05/12/2009 -  Anti-estrogen oral therapy    Femara 2.5 mg daily    05/22/2009 Surgery    Bilateral mastectomy stage IIB T2 N0 M0 IDC left breast grade 1, 2.5 cm, ER 99%, PR 41%, Ki-67 11%, HER-2 negative    12/10/2009 Surgery    Breast reconstruction surgery    11/14/2017 Relapse/Recurrence    Mid back pain: MRI revealed T11 severe pathologic fracture, abnormal signal T10, T11 and T12, moderate canal stenosis at T11    12/03/2017 PET scan    Hypermetabolic metastatic breast cancer involving thoracic and upper abdominal lymph nodes, spine and ribs. There may be hypermetabolic adenopathy in the left neck; Hypermetabolic lymph node versus intramuscular metastasis involving the medial left pectoralis musculature Pathologic T11 fracture      12/11/2017 Procedure    Biopsy of T11 vertebral body lytic lesion: Metastatic breast adenocarcinoma ER positive, PR negative    12/19/2017 - 12/26/2017 Radiation Therapy    Palliative XRT to T 11    01/12/2018 -  Anti-estrogen oral therapy    Patient could not tolerate Faslodex because of severe hypotension and severe pain in the legs after injections.  Ibrance with letrozole starting 01/12/2018     CURRENT THERAPY: Letrozole and Ibrance  INTERVAL HISTORY: Elizabeth [name] 62 y.o. female returns for evaluation prior to starting Ibrance.  She was taking the higher dose of  152m, and had neutropenia.  She was very tired as well.  She is feeling better today and is ready to start back on Ibrance.  She has continue on Letrozole, and is tolerating that well also.  She has some mild arthralgias, but otherwise feels well.  She has tramadol prescribed.  She will take this on occasion for aches and pain that occur at night to ease her pain and to help her sleep.     Patient Active Problem List   Diagnosis Date Noted  . Bone metastasis ([facility] 12/22/2017  . Primary osteoarthritis of left hip 05/26/2017  . Osteoarthritis of left hip 05/23/2017  . Breast cancer of lower-outer quadrant of left female breast ([facility] 08/06/2012    is allergic to shellfish allergy and sulfa antibiotics.  MEDICAL HISTORY: Past Medical History:  Diagnosis Date  . Arthritis   . Breast cancer ([facility] 2010   Left  . Chronic sinusitis   . Family history of adverse reaction to anesthesia    sister-nausea/vomiting  . History of chemotherapy 01/2009-03/2009  . Hypercholesteremia   . Hypothyroid    "inactive thyroid"  . Migraines   . RSD (reflex sympathetic dystrophy)     SURGICAL HISTORY: Past Surgical History:  Procedure Laterality Date  . BREAST RECONSTRUCTION    . EYE SURGERY Bilateral 7/18, 10/18  . KNEE ARTHROSCOPY Bilateral   . KNEE SURGERY Right    Two surgeries  . MASTECTOMY Bilateral 05/22/2009  . MASTECTOMY Bilateral 2011  restrictions on left arm-no labs/bloodpressure  . NASAL SINUS SURGERY    . PORTA CATH INSERTION  12/2008  . PORTA CATH REMOVAL  05/2009  . ROOT CANAL    . SHOULDER SURGERY Right   . TOTAL HIP ARTHROPLASTY Left 05/26/2017   Procedure: TOTAL HIP ARTHROPLASTY ANTERIOR APPROACH;  Surgeon: Frederik Pear, MD;  Location: [facility];  Service: Orthopedics;  Laterality: Left;    SOCIAL HISTORY: Social History   Socioeconomic History  . Marital status: Married    Spouse name: Not on file  . Number of children: Not on file  . Years of education: Not on file  .  Highest education level: Not on file  Occupational History  . Not on file  Social Needs  . Financial resource strain: Not on file  . Food insecurity:    Worry: Not on file    Inability: Not on file  . Transportation needs:    Medical: Not on file    Non-medical: Not on file  Tobacco Use  . Smoking status: Never Smoker  . Smokeless tobacco: Never Used  Substance and Sexual Activity  . Alcohol use: No  . Drug use: No  . Sexual activity: Yes    Birth control/protection: Post-menopausal  Lifestyle  . Physical activity:    Days per week: Not on file    Minutes per session: Not on file  . Stress: Not on file  Relationships  . Social connections:    Talks on phone: Not on file    Gets together: Not on file    Attends religious service: Not on file    Active member of club or organization: Not on file    Attends meetings of clubs or organizations: Not on file    Relationship status: Not on file  . Intimate partner violence:    Fear of current or ex partner: Not on file    Emotionally abused: Not on file    Physically abused: Not on file    Forced sexual activity: Not on file  Other Topics Concern  . Not on file  Social History Narrative  . Not on file    FAMILY HISTORY: Family History  Problem Relation Age of Onset  . Hypertension Mother     Review of Systems  Constitutional: Positive for fatigue. Negative for appetite change, chills, fever and unexpected weight change.  HENT:   Negative for hearing loss.   Eyes: Negative for eye problems and icterus.  Respiratory: Negative for chest tightness, cough and shortness of breath.   Cardiovascular: Negative for chest pain, leg swelling and palpitations.  Gastrointestinal: Negative for abdominal distention, abdominal pain, constipation, diarrhea, nausea and vomiting.  Endocrine: Negative for hot flashes.  Musculoskeletal: Positive for arthralgias (tolerable).  Skin: Negative for itching and rash.  Psychiatric/Behavioral:  Negative for depression. The patient is not nervous/anxious.       PHYSICAL EXAMINATION  ECOG PERFORMANCE STATUS: 1 - Symptomatic but completely ambulatory  Vitals:   02/18/18 0919  BP: 137/89  Pulse: (!) 109  Resp: 18  Temp: 98 F (36.7 C)  SpO2: 99%    Physical Exam  Constitutional: She appears well-developed and well-nourished.  HENT:  Head: Normocephalic and atraumatic.  Mouth/Throat: Oropharynx is clear and moist. No oropharyngeal exudate.  Eyes: Pupils are equal, round, and reactive to light. No scleral icterus.  Neck: Neck supple.  Cardiovascular: Normal rate, regular rhythm and normal heart sounds.  Pulmonary/Chest: Effort normal and breath sounds normal.  Abdominal: Soft. Bowel sounds  are normal.  Musculoskeletal: She exhibits no edema.  Lymphadenopathy:    She has no cervical adenopathy.  Skin: Skin is warm and dry. Capillary refill takes less than 2 seconds.  Psychiatric: She has a normal mood and affect.    LABORATORY DATA:  CBC    Component Value Date/Time   WBC 3.2 (L) 02/18/2018 0844   WBC 4.1 12/11/2017 0641   RBC 3.96 02/18/2018 0844   HGB 12.3 02/18/2018 0844   HGB 13.0 04/11/2014 0804   HCT 38.3 02/18/2018 0844   HCT 39.5 04/11/2014 0804   PLT 535 (H) 02/18/2018 0844   PLT 359 04/11/2014 0804   MCV 96.7 02/18/2018 0844   MCV 93.4 04/11/2014 0804   MCH 31.1 02/18/2018 0844   MCHC 32.1 02/18/2018 0844   RDW 14.7 02/18/2018 0844   RDW 13.1 04/11/2014 0804   LYMPHSABS 0.7 02/18/2018 0844   LYMPHSABS 1.8 04/11/2014 0804   MONOABS 0.6 02/18/2018 0844   MONOABS 0.5 04/11/2014 0804   EOSABS 0.1 02/18/2018 0844   EOSABS 0.2 04/11/2014 0804   BASOSABS 0.1 02/18/2018 0844   BASOSABS 0.0 04/11/2014 0804    CMP     Component Value Date/Time   NA 141 01/28/2018 1504   NA 142 04/11/2014 0804   K 4.5 01/28/2018 1504   K 4.0 04/11/2014 0804   CL 105 01/28/2018 1504   CL 105 08/06/2012 1313   CO2 26 01/28/2018 1504   CO2 28 04/11/2014 0804    GLUCOSE 99 01/28/2018 1504   GLUCOSE 82 04/11/2014 0804   GLUCOSE 100 (H) 08/06/2012 1313   BUN 20 01/28/2018 1504   BUN 13.4 04/11/2014 0804   CREATININE 1.56 (H) 01/28/2018 1504   CREATININE 0.8 04/11/2014 0804   CALCIUM 9.6 01/28/2018 1504   CALCIUM 9.1 04/11/2014 0804   PROT 7.0 01/28/2018 1504   PROT 6.6 04/11/2014 0804   ALBUMIN 3.7 01/28/2018 1504   ALBUMIN 3.7 04/11/2014 0804   AST 23 01/28/2018 1504   AST 16 04/11/2014 0804   ALT 18 01/28/2018 1504   ALT 16 04/11/2014 0804   ALKPHOS 103 01/28/2018 1504   ALKPHOS 91 04/11/2014 0804   BILITOT 0.3 01/28/2018 1504   BILITOT 0.48 04/11/2014 0804   GFRNONAA 34 (L) 01/28/2018 1504   GFRAA 40 (L) 01/28/2018 1504        ASSESSMENT and PLAN:   Breast cancer of lower-outer quadrant of left female breast Left breast cancer invasive ductal carcinoma 2.5 cm in size grade 1, ER 99%, PR 41%, Ki-67 11%, HER-2 negative T2 N0 M0 stage II a status post neoadjuvant chemotherapy followed by surgery February 2011and wason antiestrogen therapy with Femara from January 2011-July 2017  New onset mid back pain: MRI revealedT11 severe pathologic fracture, abnormal signal T10, T11 and T12, moderate canal stenosis at T11  12/02/2017: PET CT scan hypermetabolic lymphadenopathy in the thoracic and upper abdominal lymph nodes, spine and rib metastases, intramuscular metastases the left pectoralis muscle, T11 vertebral fracture pathologic  Biopsy of the T11 vertebral body was performed results are pending.  Tissue has been requested to be sent for prognostic panel (ER and PR Positive), foundation 1, PDL 1 Pathologic fracture T11: She met with orthopedics regarding kyphoplasty and they determined that there is no surgical options available for her. ------------------------------------------------------------------- Treatment  : 1.  Palliative radiation to T11 vertebral body and the pectoralis muscle: 12/20/17-12/26/17 2.   current treatment:  Ibrance with letrozole along with Delton See or Zometa Patient could not tolerate Faslodex  because of intense bone pain as well as hypotension.  Ibrance toxicities: 1.  ANC:    1.7 today.  She is cleared to restart the Ibrance at the lower dose of 17m daily.  She will continue taking Letrozole daily.     Return to clinic in 2 weeks with labs and follow-up.  I requested they add on Zometa appointment for after this so Dr. GLindi Adiecan review with her and she can receive it as ordered.   All questions were answered. The patient knows to call the clinic with any problems, questions or concerns. We can certainly see the patient much sooner if necessary.  A total of (20) minutes of face-to-face time was spent with this patient with greater than 50% of that time in counseling and care-coordination.  This note was electronically signed. LScot Dock NP 02/21/2018

## 2018-02-18 NOTE — Telephone Encounter (Signed)
Patient at center today for appt.  Checking in on LTD papers.  She has to pick them up herself.  Contacted dept for disability and papers are not yet complete

## 2018-02-18 NOTE — Telephone Encounter (Signed)
Patient declined calendar and avs. °

## 2018-02-21 NOTE — Assessment & Plan Note (Addendum)
Left breast cancer invasive ductal carcinoma 2.5 cm in size grade 1, ER 99%, PR 41%, Ki-67 11%, HER-2 negative T2 N0 M0 stage II a status post neoadjuvant chemotherapy followed by surgery February 2011and wason antiestrogen therapy with Femara from January 2011-July 2017  New onset mid back pain: MRI revealedT11 severe pathologic fracture, abnormal signal T10, T11 and T12, moderate canal stenosis at T11  12/02/2017: PET CT scan hypermetabolic lymphadenopathy in the thoracic and upper abdominal lymph nodes, spine and rib metastases, intramuscular metastases the left pectoralis muscle, T11 vertebral fracture pathologic  Biopsy of the T11 vertebral body was performed results are pending.  Tissue has been requested to be sent for prognostic panel (ER and PR Positive), foundation 1, PDL 1 Pathologic fracture T11: She met with orthopedics regarding kyphoplasty and they determined that there is no surgical options available for her. ------------------------------------------------------------------- Treatment  : 1.  Palliative radiation to T11 vertebral body and the pectoralis muscle: 12/20/17-12/26/17 2.   current treatment: Ibrance with letrozole along with Delton See or Zometa Patient could not tolerate Faslodex because of intense bone pain as well as hypotension.  Ibrance toxicities: 1.  ANC:   1.7 today.  She is cleared to restart the Ibrance at the lower dose of 135m daily.  She will continue taking Letrozole daily.     Return to clinic in 2 weeks with labs and follow-up.  I requested they add on Zometa appointment for after this so Dr. GLindi Adiecan review with her and she can receive it as ordered.

## 2018-02-23 ENCOUNTER — Telehealth: Payer: Self-pay | Admitting: Hematology and Oncology

## 2018-02-23 NOTE — Telephone Encounter (Signed)
LVM for pt regarding appts per 11/9 sch message

## 2018-03-04 ENCOUNTER — Inpatient Hospital Stay: Payer: 59

## 2018-03-04 ENCOUNTER — Inpatient Hospital Stay (HOSPITAL_BASED_OUTPATIENT_CLINIC_OR_DEPARTMENT_OTHER): Payer: 59 | Admitting: Hematology and Oncology

## 2018-03-04 ENCOUNTER — Telehealth: Payer: Self-pay | Admitting: Hematology and Oncology

## 2018-03-04 VITALS — HR 102

## 2018-03-04 DIAGNOSIS — Z79811 Long term (current) use of aromatase inhibitors: Secondary | ICD-10-CM | POA: Diagnosis not present

## 2018-03-04 DIAGNOSIS — C50512 Malignant neoplasm of lower-outer quadrant of left female breast: Secondary | ICD-10-CM

## 2018-03-04 DIAGNOSIS — I959 Hypotension, unspecified: Secondary | ICD-10-CM

## 2018-03-04 DIAGNOSIS — Z170 Estrogen receptor positive status [ER+]: Principal | ICD-10-CM

## 2018-03-04 DIAGNOSIS — C7951 Secondary malignant neoplasm of bone: Secondary | ICD-10-CM | POA: Diagnosis not present

## 2018-03-04 LAB — CBC WITH DIFFERENTIAL (CANCER CENTER ONLY)
Abs Immature Granulocytes: 0.03 10*3/uL (ref 0.00–0.07)
BASOS ABS: 0 10*3/uL (ref 0.0–0.1)
BASOS PCT: 1 %
EOS PCT: 4 %
Eosinophils Absolute: 0.1 10*3/uL (ref 0.0–0.5)
HEMATOCRIT: 37.2 % (ref 36.0–46.0)
Hemoglobin: 12.2 g/dL (ref 12.0–15.0)
Immature Granulocytes: 1 %
Lymphocytes Relative: 21 %
Lymphs Abs: 0.5 10*3/uL — ABNORMAL LOW (ref 0.7–4.0)
MCH: 31.4 pg (ref 26.0–34.0)
MCHC: 32.8 g/dL (ref 30.0–36.0)
MCV: 95.6 fL (ref 80.0–100.0)
Monocytes Absolute: 0.2 10*3/uL (ref 0.1–1.0)
Monocytes Relative: 9 %
NRBC: 0 % (ref 0.0–0.2)
Neutro Abs: 1.4 10*3/uL — ABNORMAL LOW (ref 1.7–7.7)
Neutrophils Relative %: 64 %
Platelet Count: 268 10*3/uL (ref 150–400)
RBC: 3.89 MIL/uL (ref 3.87–5.11)
RDW: 14.7 % (ref 11.5–15.5)
WBC: 2.3 10*3/uL — AB (ref 4.0–10.5)

## 2018-03-04 MED ORDER — SODIUM CHLORIDE 0.9 % IV SOLN
Freq: Once | INTRAVENOUS | Status: AC
  Administered 2018-03-04: 10:00:00 via INTRAVENOUS
  Filled 2018-03-04: qty 250

## 2018-03-04 MED ORDER — ZOLEDRONIC ACID 4 MG/5ML IV CONC
3.0000 mg | Freq: Once | INTRAVENOUS | Status: AC
  Administered 2018-03-04: 3 mg via INTRAVENOUS
  Filled 2018-03-04: qty 3.75

## 2018-03-04 MED ORDER — SODIUM CHLORIDE 0.9% FLUSH
10.0000 mL | Freq: Once | INTRAVENOUS | Status: DC | PRN
  Filled 2018-03-04: qty 10

## 2018-03-04 MED ORDER — HEPARIN SOD (PORK) LOCK FLUSH 100 UNIT/ML IV SOLN
500.0000 [IU] | Freq: Once | INTRAVENOUS | Status: DC | PRN
  Filled 2018-03-04: qty 5

## 2018-03-04 MED ORDER — ZOLEDRONIC ACID 4 MG/100ML IV SOLN: 4.0000 mg | Freq: Once | INTRAVENOUS | Status: DC

## 2018-03-04 MED ORDER — ALTEPLASE 2 MG IJ SOLR
2.0000 mg | Freq: Once | INTRAMUSCULAR | Status: DC | PRN
  Filled 2018-03-04: qty 2

## 2018-03-04 MED ORDER — SODIUM CHLORIDE 0.9% FLUSH
3.0000 mL | Freq: Once | INTRAVENOUS | Status: DC | PRN
  Filled 2018-03-04: qty 10

## 2018-03-04 MED ORDER — HEPARIN SOD (PORK) LOCK FLUSH 100 UNIT/ML IV SOLN
250.0000 [IU] | Freq: Once | INTRAVENOUS | Status: DC | PRN
  Filled 2018-03-04: qty 5

## 2018-03-04 NOTE — Telephone Encounter (Signed)
Patient decline avs and calendar °

## 2018-03-04 NOTE — Assessment & Plan Note (Signed)
Left breast cancer invasive ductal carcinoma 2.5 cm in size grade 1, ER 99%, PR 41%, Ki-67 11%, HER-2 negative T2 N0 M0 stage II a status post neoadjuvant chemotherapy followed by surgery February 2011and wason antiestrogen therapy with Femara from January 2011-July 2017  New onset mid back pain: MRI revealedT11 severe pathologic fracture, abnormal signal T10, T11 and T12, moderate canal stenosis at T11  12/02/2017: PET CT scan hypermetabolic lymphadenopathy in the thoracic and upper abdominal lymph nodes, spine and rib metastases, intramuscular metastases the left pectoralis muscle, T11 vertebral fracture pathologic  Biopsy of the T11 vertebral body  ER 90%, PR 0%, Ki-67 10%, HER-2 -1+, foundation 1, PDL 1 pending  Pathologic fracture T11: She met with orthopedics regarding kyphoplasty and they determined that there is no surgical options available for her. ------------------------------------------------------------------- Treatment : 1. Palliative radiation to T11 vertebral body and the pectoralis muscle: 12/20/17-12/26/17 2.  current treatment: Ibrance with letrozole along with Delton See or Zometa Patient could not tolerate Faslodex because of intense bone pain as well as hypotension.  Ibrance toxicities: 1.ANC:  Continue with current dosage of 100 mg Ibrance Today she will receive Zometa every 3 months. Return to clinic in 1 month with lab checks and follow-up.

## 2018-03-04 NOTE — Patient Instructions (Signed)
Zoledronic Acid injection (Hypercalcemia, Oncology) (Zometa) What is this medicine? ZOLEDRONIC ACID (ZOE le dron ik AS id) lowers the amount of calcium loss from bone. It is used to treat too much calcium in your blood from cancer. It is also used to prevent complications of cancer that has spread to the bone. This medicine may be used for other purposes; ask your health care provider or pharmacist if you have questions. COMMON BRAND NAME(S): Zometa What should I tell my health care provider before I take this medicine? They need to know if you have any of these conditions: -aspirin-sensitive asthma -cancer, especially if you are receiving medicines used to treat cancer -dental disease or wear dentures -infection -kidney disease -receiving corticosteroids like dexamethasone or prednisone -an unusual or allergic reaction to zoledronic acid, other medicines, foods, dyes, or preservatives -pregnant or trying to get pregnant -breast-feeding How should I use this medicine? This medicine is for infusion into a vein. It is given by a health care professional in a hospital or clinic setting. Talk to your pediatrician regarding the use of this medicine in children. Special care may be needed. Overdosage: If you think you have taken too much of this medicine contact a poison control center or emergency room at once. NOTE: This medicine is only for you. Do not share this medicine with others. What if I miss a dose? It is important not to miss your dose. Call your doctor or health care professional if you are unable to keep an appointment. What may interact with this medicine? -certain antibiotics given by injection -NSAIDs, medicines for pain and inflammation, like ibuprofen or naproxen -some diuretics like bumetanide, furosemide -teriparatide -thalidomide This list may not describe all possible interactions. Give your health care provider a list of all the medicines, herbs, non-prescription drugs,  or dietary supplements you use. Also tell them if you smoke, drink alcohol, or use illegal drugs. Some items may interact with your medicine. What should I watch for while using this medicine? Visit your doctor or health care professional for regular checkups. It may be some time before you see the benefit from this medicine. Do not stop taking your medicine unless your doctor tells you to. Your doctor may order blood tests or other tests to see how you are doing. Women should inform their doctor if they wish to become pregnant or think they might be pregnant. There is a potential for serious side effects to an unborn child. Talk to your health care professional or pharmacist for more information. You should make sure that you get enough calcium and vitamin D while you are taking this medicine. Discuss the foods you eat and the vitamins you take with your health care professional. Some people who take this medicine have severe bone, joint, and/or muscle pain. This medicine may also increase your risk for jaw problems or a broken thigh bone. Tell your doctor right away if you have severe pain in your jaw, bones, joints, or muscles. Tell your doctor if you have any pain that does not go away or that gets worse. Tell your dentist and dental surgeon that you are taking this medicine. You should not have major dental surgery while on this medicine. See your dentist to have a dental exam and fix any dental problems before starting this medicine. Take good care of your teeth while on this medicine. Make sure you see your dentist for regular follow-up appointments. What side effects may I notice from receiving this medicine? Side effects  that you should report to your doctor or health care professional as soon as possible: -allergic reactions like skin rash, itching or hives, swelling of the face, lips, or tongue -anxiety, confusion, or depression -breathing problems -changes in vision -eye pain -feeling faint  or lightheaded, falls -jaw pain, especially after dental work -mouth sores -muscle cramps, stiffness, or weakness -redness, blistering, peeling or loosening of the skin, including inside the mouth -trouble passing urine or change in the amount of urine Side effects that usually do not require medical attention (report to your doctor or health care professional if they continue or are bothersome): -bone, joint, or muscle pain -constipation -diarrhea -fever -hair loss -irritation at site where injected -loss of appetite -nausea, vomiting -stomach upset -trouble sleeping -trouble swallowing -weak or tired This list may not describe all possible side effects. Call your doctor for medical advice about side effects. You may report side effects to FDA at 1-800-FDA-1088. Where should I keep my medicine? This drug is given in a hospital or clinic and will not be stored at home. NOTE: This sheet is a summary. It may not cover all possible information. If you have questions about this medicine, talk to your doctor, pharmacist, or health care provider.  2018 Elsevier/Gold Standard (2013-08-28 14:19:39)  Neutropenia Neutropenia is a condition that occurs when you have a lower-than-normal level of a type of white blood cell (neutrophil) in your body. Neutrophils are made in the spongy center of large bones (bone marrow) and they fight infections. Neutrophils are your body's main defense against bacterial and fungal infections. The fewer neutrophils you have and the longer your body remains without them, the greater your risk of getting a severe infection. What are the causes? This condition can occur if your body uses up or destroys neutrophils faster than your bone marrow can make them. This problem may happen because of:  Bacterial or fungal infection.  Allergic disorders.  Reactions to some medicines.  Autoimmune disease.  An enlarged spleen.  This condition can also occur if your bone  marrow does not produce enough neutrophils. This problem may be caused by:  Cancer.  Cancer treatments, such as radiation or chemotherapy.  Viral infections.  Medicines, such as phenytoin.  Vitamin B12 deficiency.  Diseases of the bone marrow.  Environmental toxins, such as insecticides.  What are the signs or symptoms? This condition does not usually cause symptoms. If symptoms are present, they are usually caused by an underlying infection. Symptoms of an infection may include:  Fever.  Chills.  Swollen glands.  Oral or anal ulcers.  Cough and shortness of breath.  Rash.  Skin infection.  Fatigue.  How is this diagnosed? Your health care provider may suspect neutropenia if you have:  A condition that may cause neutropenia.  Symptoms of infection, especially fever.  Frequent and unusual infections.  You will have a medical history and physical exam. Tests will also be done, such as:  A complete blood count (CBC).  A procedure to collect a sample of bone marrow for examination (bone marrow biopsy).  A chest X-ray.  A urine culture.  A blood culture.  How is this treated? Treatment depends on the underlying cause and severity of your condition. Mild neutropenia may not require treatment. Treatment may include medicines, such as:  Antibiotic medicine given through an IV tube.  Antiviral medicines.  Antifungal medicines.  A medicine to increase neutrophil production (colony-stimulating factor). You may get this drug through an IV tube or by  injection.  Steroids given through an IV tube.  If an underlying condition is causing neutropenia, you may need treatment for that condition. If medicines you are taking are causing neutropenia, your health care provider may have you stop taking those medicines. Follow these instructions at home: Medicines  Take over-the-counter and prescription medicines only as told by your health care provider.  Get a  seasonal flu shot (influenza vaccine). Lifestyle  Do not eat unpasteurized foods.Do not eat unwashed raw fruits or vegetables.  Avoid exposure to groups of people or children.  Avoid being around people who are sick.  Avoid being around dirt or dust, such as in construction areas or gardens.  Do not provide direct care for pets. Avoid animal droppings. Do not clean litter boxes and bird cages. Hygiene   Bathe daily.  Clean the area between the genitals and the anus (perineal area) after you urinate or have a bowel movement. If you are female, wipe from front to back.  Brush your teeth with a soft toothbrush before and after meals.  Do not use a razor that has a blade. Use an electric razor to remove hair.  Wash your hands often. Make sure others who come in contact with you also wash their hands. If soap and water are not available, use hand sanitizer. General instructions  Do not have sex unless your health care provider has approved.  Take actions to avoid cuts and burns. For example: ? Be cautious when you use knives. Always cut away from yourself. ? Keep knives in protective sheaths or guards when not in use. ? Use oven mitts when you cook with a hot stove, oven, or grill. ? Stand a safe distance away from open fires.  Avoid people who received a vaccine in the past 30 days if that vaccine contained a live version of the germ (live vaccine). You should not get a live vaccine. Common live vaccines are varicella, measles, mumps, and rubella.  Do not share food utensils.  Do not use tampons, enemas, or rectal suppositories unless your health care provider has approved.  Keep all appointments as told by your health care provider. This is important. Contact a health care provider if:  You have a fever.  You have chills or you start to shake.  You have: ? A sore throat. ? A warm, red, or tender area on your skin. ? A cough. ? Frequent or painful urination. ? Vaginal  discharge or itching.  You develop: ? Sores in your mouth or anus. ? Swollen lymph nodes. ? Red streaks on the skin. ? A rash.  You feel: ? Nauseous or you vomit. ? Very fatigued. ? Short of breath. This information is not intended to replace advice given to you by your health care provider. Make sure you discuss any questions you have with your health care provider. Document Released: 09/21/2001 Document Revised: 09/07/2015 Document Reviewed: 10/12/2014 Elsevier Interactive Patient Education  [employer].

## 2018-03-04 NOTE — Progress Notes (Signed)
Patient Care Team: [name] as PCP - General (Nurse Practitioner)  DIAGNOSIS:  Encounter Diagnosis  Name Primary?  . Malignant neoplasm of lower-outer quadrant of left breast of female, estrogen receptor positive ([place])     SUMMARY OF ONCOLOGIC HISTORY:   Breast cancer of lower-outer quadrant of left female breast ([place])   12/13/2008 Initial Diagnosis    Left breast biopsy: Invasive ductal carcinoma ER 90% percent, PR 41%, Ki-67 11%, HER-2 negative ratio 1, Oncotype DX score 23, ROR15%    01/27/2009 - 03/10/2009 Neo-Adjuvant Chemotherapy    Neoadjuvant FEC 4; participant in the "bed sheets" study.    05/12/2009 -  Anti-estrogen oral therapy    Femara 2.5 mg daily    05/22/2009 Surgery    Bilateral mastectomy stage IIB T2 N0 M0 IDC left breast grade 1, 2.5 cm, ER 99%, PR 41%, Ki-67 11%, HER-2 negative    12/10/2009 Surgery    Breast reconstruction surgery    11/14/2017 Relapse/Recurrence    Mid back pain: MRI revealed T11 severe pathologic fracture, abnormal signal T10, T11 and T12, moderate canal stenosis at T11    12/03/2017 PET scan    Hypermetabolic metastatic breast cancer involving thoracic and upper abdominal lymph nodes, spine and ribs. There may be hypermetabolic adenopathy in the left neck; Hypermetabolic lymph node versus intramuscular metastasis involving the medial left pectoralis musculature Pathologic T11 fracture      12/11/2017 Procedure    Biopsy of T11 vertebral body lytic lesion: Metastatic breast adenocarcinoma ER positive, PR negative    12/19/2017 - 12/26/2017 Radiation Therapy    Palliative XRT to T 11    01/12/2018 -  Anti-estrogen oral therapy    Patient could not tolerate Faslodex because of severe hypotension and severe pain in the legs after injections.  Ibrance with letrozole starting 01/12/2018     CHIEF COMPLIANT: Follow-up on Ibrance with letrozole  INTERVAL HISTORY: KEMONI QUESENBERRY is a 62 year old with above-mentioned history of  metastatic breast cancer with bone metastases who is here today to receive Zometa.  She has been on 900 mg of Ibrance for the past 2 weeks and is here for toxicity check and lab check.  She reports that she does feel fatigued as well as achiness in her muscles.  She does not have the severe bone pain that she had with Faslodex.  REVIEW OF SYSTEMS:   Constitutional: Denies fevers, chills or abnormal weight loss Eyes: Denies blurriness of vision Ears, nose, mouth, throat, and face: Denies mucositis or sore throat Respiratory: Denies cough, dyspnea or wheezes Cardiovascular: Denies palpitation, chest discomfort Gastrointestinal:  Denies nausea, heartburn or change in bowel habits Skin: Denies abnormal skin rashes Lymphatics: Denies new lymphadenopathy or easy bruising Neurological:Denies numbness, tingling or new weaknesses Behavioral/Psych: Mood is stable, no new changes  Extremities: No lower extremity edema   All other systems were reviewed with the patient and are negative.  I have reviewed the past medical history, past surgical history, social history and family history with the patient and they are unchanged from previous note.  ALLERGIES:  is allergic to shellfish allergy and sulfa antibiotics.  MEDICATIONS:  Current Outpatient Medications  Medication Sig Dispense Refill  . Biotin 10000 MCG TABS Take 10,000 mcg by mouth daily.     . Cholecalciferol (VITAMIN D PO) Take 5,000 Units by mouth daily.     Marland Kitchen letrozole (FEMARA) 2.5 MG tablet Take 1 tablet (2.5 mg total) by mouth daily. 90 tablet 3  . levothyroxine (SYNTHROID,  LEVOTHROID) 25 MCG tablet Take 25 mcg by mouth daily before breakfast.     . Multiple Vitamin (MULTIVITAMIN) tablet Take 1 tablet by mouth daily.    . Omega-3 Fatty Acids (SUPER OMEGA 3 EPA/DHA PO) Take 1 capsule by mouth daily.    . ondansetron (ZOFRAN) 8 MG tablet Take 1 tablet (8 mg total) by mouth every 8 (eight) hours as needed for nausea or vomiting. 30 tablet 1   . oxyCODONE-acetaminophen (PERCOCET/ROXICET) 5-325 MG tablet Take 1 tablet by mouth every 8 (eight) hours as needed for severe pain. 60 tablet 0  . palbociclib (IBRANCE) 100 MG capsule Take 1 capsule (100 mg total) by mouth daily with breakfast. Take whole with food. Take for 21 days on, 7 days off, repeat every 28 days. 21 capsule 0  . prednisoLONE acetate (PRED FORTE) 1 % ophthalmic suspension Place 1 drop into both eyes daily.    . traMADol (ULTRAM) 50 MG tablet Take 1 tablet (50 mg total) by mouth every 6 (six) hours as needed. 30 tablet 0   No current facility-administered medications for this visit.     PHYSICAL EXAMINATION: ECOG PERFORMANCE STATUS: 1 - Symptomatic but completely ambulatory  Vitals:   03/04/18 0845  BP: 132/83  Pulse: (!) 113  Resp: 18  Temp: 98.4 F (36.9 C)  SpO2: 97%   Filed Weights   03/04/18 0845  Weight: 123 lb 3.2 oz (55.9 kg)    GENERAL:alert, no distress and comfortable SKIN: skin color, texture, turgor are normal, no rashes or significant lesions EYES: normal, Conjunctiva are pink and non-injected, sclera clear OROPHARYNX:no exudate, no erythema and lips, buccal mucosa, and tongue normal  NECK: supple, thyroid normal size, non-tender, without nodularity LYMPH:  no palpable lymphadenopathy in the cervical, axillary or inguinal LUNGS: clear to auscultation and percussion with normal breathing effort HEART: regular rate & rhythm and no murmurs and no lower extremity edema ABDOMEN:abdomen soft, non-tender and normal bowel sounds MUSCULOSKELETAL:no cyanosis of digits and no clubbing  NEURO: alert & oriented x 3 with fluent speech, no focal motor/sensory deficits EXTREMITIES: No lower extremity edema   LABORATORY DATA:  I have reviewed the data as listed CMP Latest Ref Rng & Units 01/28/2018 11/24/2017 05/27/2017  Glucose 70 - 99 mg/dL 99 103(H) 261(H)  BUN 8 - 23 mg/dL _0 Creatinine 0.44 - 1.00 mg/dL 1.56(H) 1.07(H) 0.74  Sodium 135 - 145  mmol/L 141 142 133(L)  Potassium 3.5 - 5.1 mmol/L 4.5 4.1 4.7  Chloride 98 - 111 mmol/L 105 105 101  CO2 22 - 32 mmol/L _1 Calcium 8.9 - 10.3 mg/dL 9.6 9.9 8.1(L)  Total Protein 6.5 - 8.1 g/dL 7.0 7.5 -  Total Bilirubin 0.3 - 1.2 mg/dL 0.3 0.3 -  Alkaline Phos 38 - 126 U/L 103 146(H) -  AST 15 - 41 U/L 23 23 -  ALT 0 - 44 U/L 18 11 -    Lab Results  Component Value Date   WBC 2.3 (L) 03/04/2018   HGB 12.2 03/04/2018   HCT 37.2 03/04/2018   MCV 95.6 03/04/2018   PLT 268 03/04/2018   NEUTROABS PENDING 03/04/2018    ASSESSMENT & PLAN:  Breast cancer of lower-outer quadrant of left female breast Left breast cancer invasive ductal carcinoma 2.5 cm in size grade 1, ER 99%, PR 41%, Ki-67 11%, HER-2 negative T2 N0 M0 stage II a status post neoadjuvant chemotherapy followed by surgery February 2011and wason antiestrogen therapy with Femara from  January 2011-July 2017  New onset mid back pain: MRI revealedT11 severe pathologic fracture, abnormal signal T10, T11 and T12, moderate canal stenosis at T11  12/02/2017: PET CT scan hypermetabolic lymphadenopathy in the thoracic and upper abdominal lymph nodes, spine and rib metastases, intramuscular metastases the left pectoralis muscle, T11 vertebral fracture pathologic  Biopsy of the T11 vertebral body  ER 90%, PR 0%, Ki-67 10%, HER-2 -1+, foundation 1, PDL 1 pending  Pathologic fracture T11: She met with orthopedics regarding kyphoplasty and they determined that there is no surgical options available for her. ------------------------------------------------------------------- Treatment : 1. Palliative radiation to T11 vertebral body and the pectoralis muscle: 12/20/17-12/26/17 2.  current treatment: Ibrance with letrozole along with Delton See or Zometa Patient could not tolerate Faslodex because of intense bone pain as well as hypotension.  Ibrance toxicities: 1.ANC:  Continue with current dosage of 100 mg Ibrance Today she  will receive Zometa every 3 months. Return to clinic in 2 weeks with lab checks and follow-up.    No orders of the defined types were placed in this encounter.  The patient has a good understanding of the overall plan. she agrees with it. she will call with any problems that may develop before the next visit here.   Harriette Ohara, MD 03/04/18

## 2018-03-05 ENCOUNTER — Other Ambulatory Visit: Payer: Self-pay | Admitting: Hematology and Oncology

## 2018-03-05 ENCOUNTER — Telehealth: Payer: Self-pay

## 2018-03-05 DIAGNOSIS — C50512 Malignant neoplasm of lower-outer quadrant of left female breast: Secondary | ICD-10-CM

## 2018-03-05 DIAGNOSIS — Z170 Estrogen receptor positive status [ER+]: Principal | ICD-10-CM

## 2018-03-05 NOTE — Telephone Encounter (Signed)
Returned patient's call regarding needing schedule of previous radiation appointments for insurance.  Nurse faxed schedule to [phone] per patient's request.  No further needs at this time.

## 2018-03-18 ENCOUNTER — Telehealth: Payer: Self-pay | Admitting: Hematology and Oncology

## 2018-03-18 ENCOUNTER — Inpatient Hospital Stay: Payer: 59

## 2018-03-18 ENCOUNTER — Encounter: Payer: Self-pay | Admitting: Adult Health

## 2018-03-18 ENCOUNTER — Inpatient Hospital Stay (HOSPITAL_BASED_OUTPATIENT_CLINIC_OR_DEPARTMENT_OTHER): Payer: 59 | Admitting: Adult Health

## 2018-03-18 ENCOUNTER — Telehealth: Payer: Self-pay

## 2018-03-18 ENCOUNTER — Inpatient Hospital Stay: Payer: 59 | Attending: Hematology and Oncology

## 2018-03-18 VITALS — BP 141/93 | HR 107 | Temp 97.8°F | Resp 16 | Ht 64.0 in | Wt 122.8 lb

## 2018-03-18 DIAGNOSIS — M8458XA Pathological fracture in neoplastic disease, other specified site, initial encounter for fracture: Secondary | ICD-10-CM

## 2018-03-18 DIAGNOSIS — Z79811 Long term (current) use of aromatase inhibitors: Secondary | ICD-10-CM | POA: Insufficient documentation

## 2018-03-18 DIAGNOSIS — C50512 Malignant neoplasm of lower-outer quadrant of left female breast: Secondary | ICD-10-CM | POA: Diagnosis present

## 2018-03-18 DIAGNOSIS — R3911 Hesitancy of micturition: Secondary | ICD-10-CM

## 2018-03-18 DIAGNOSIS — Z170 Estrogen receptor positive status [ER+]: Secondary | ICD-10-CM | POA: Insufficient documentation

## 2018-03-18 DIAGNOSIS — C7951 Secondary malignant neoplasm of bone: Secondary | ICD-10-CM | POA: Diagnosis not present

## 2018-03-18 LAB — CMP (CANCER CENTER ONLY)
ALBUMIN: 3.6 g/dL (ref 3.5–5.0)
ALT: 12 U/L (ref 0–44)
AST: 22 U/L (ref 15–41)
Alkaline Phosphatase: 101 U/L (ref 38–126)
Anion gap: 9 (ref 5–15)
BUN: 15 mg/dL (ref 8–23)
CHLORIDE: 107 mmol/L (ref 98–111)
CO2: 26 mmol/L (ref 22–32)
Calcium: 9.2 mg/dL (ref 8.9–10.3)
Creatinine: 0.82 mg/dL (ref 0.44–1.00)
GFR, Est AFR Am: >60 mL/min (ref >60)
GFR, Estimated: >60 mL/min (ref >60)
GLUCOSE: 90 mg/dL (ref 70–99)
Potassium: 4.1 mmol/L (ref 3.5–5.1)
Sodium: 142 mmol/L (ref 135–145)
Total Bilirubin: 0.3 mg/dL (ref 0.3–1.2)
Total Protein: 7.3 g/dL (ref 6.5–8.1)

## 2018-03-18 LAB — CBC WITH DIFFERENTIAL (CANCER CENTER ONLY)
Abs Immature Granulocytes: 0 10*3/uL (ref 0.00–0.07)
Basophils Absolute: 0.1 10*3/uL (ref 0.0–0.1)
Basophils Relative: 3 %
Eosinophils Absolute: 0 10*3/uL (ref 0.0–0.5)
Eosinophils Relative: 1 %
HCT: 34.8 % — ABNORMAL LOW (ref 36.0–46.0)
HEMOGLOBIN: 11.6 g/dL — AB (ref 12.0–15.0)
Immature Granulocytes: 0 %
LYMPHS ABS: 0.5 10*3/uL — AB (ref 0.7–4.0)
Lymphocytes Relative: 28 %
MCH: 32.9 pg (ref 26.0–34.0)
MCHC: 33.3 g/dL (ref 30.0–36.0)
MCV: 98.6 fL (ref 80.0–100.0)
Monocytes Absolute: 0.2 10*3/uL (ref 0.1–1.0)
Monocytes Relative: 14 %
NRBC: 0 % (ref 0.0–0.2)
Neutro Abs: 0.9 10*3/uL — ABNORMAL LOW (ref 1.7–7.7)
Neutrophils Relative %: 54 %
PLATELETS: 186 10*3/uL (ref 150–400)
RBC: 3.53 MIL/uL — ABNORMAL LOW (ref 3.87–5.11)
RDW: 16.4 % — AB (ref 11.5–15.5)
WBC Count: 1.7 10*3/uL — ABNORMAL LOW (ref 4.0–10.5)

## 2018-03-18 LAB — URINALYSIS, COMPLETE (UACMP) WITH MICROSCOPIC
Bacteria, UA: NONE SEEN
Bilirubin Urine: NEGATIVE
Glucose, UA: NEGATIVE mg/dL
Hgb urine dipstick: NEGATIVE
Ketones, ur: NEGATIVE mg/dL
Leukocytes, UA: NEGATIVE
Nitrite: NEGATIVE
Protein, ur: NEGATIVE mg/dL
Specific Gravity, Urine: 1.019 (ref 1.005–1.030)
pH: 5 (ref 5.0–8.0)

## 2018-03-18 NOTE — Telephone Encounter (Signed)
Pt called to asked what her UA results were and if she needed any antibiotic at this time. Pt UA was negative and does not need any antibiotic at this time. Final culture still pending. Will let pt know when resulted. Suggested that pt increase hydration and try an over the counter probiotic to help with maintaining urinary tract health.Pt verbalized understanding.

## 2018-03-18 NOTE — Progress Notes (Signed)
[name] up:    [name], [name], PA-C [address] [place] [address]   DIAGNOSIS: Cancer Staging Breast cancer of lower-outer quadrant of left female breast ([place]) Staging form: Breast, AJCC 7th Edition - Clinical: Stage IV (M1) - Unsigned   SUMMARY OF ONCOLOGIC HISTORY:   Breast cancer of lower-outer quadrant of left female breast ([facility])   12/13/2008 Initial Diagnosis    Left breast biopsy: Invasive ductal carcinoma ER 90% percent, PR 41%, Ki-67 11%, HER-2 negative ratio 1, Oncotype DX score 23, ROR15%    01/27/2009 - 03/10/2009 Neo-Adjuvant Chemotherapy    Neoadjuvant FEC 4; participant in the "bed sheets" study.    05/12/2009 -  Anti-estrogen oral therapy    Femara 2.5 mg daily    05/22/2009 Surgery    Bilateral mastectomy stage IIB T2 N0 M0 IDC left breast grade 1, 2.5 cm, ER 99%, PR 41%, Ki-67 11%, HER-2 negative    12/10/2009 Surgery    Breast reconstruction surgery    11/14/2017 Relapse/Recurrence    Mid back pain: MRI revealed T11 severe pathologic fracture, abnormal signal T10, T11 and T12, moderate canal stenosis at T11    12/03/2017 PET scan    Hypermetabolic metastatic breast cancer involving thoracic and upper abdominal lymph nodes, spine and ribs. There may be hypermetabolic adenopathy in the left neck; Hypermetabolic lymph node versus intramuscular metastasis involving the medial left pectoralis musculature Pathologic T11 fracture      12/11/2017 Procedure    Biopsy of T11 vertebral body lytic lesion: Metastatic breast adenocarcinoma ER positive, PR negative    12/19/2017 - 12/26/2017 Radiation Therapy    Palliative XRT to T 11    01/12/2018 -  Anti-estrogen oral therapy    Patient could not tolerate Faslodex because of severe hypotension and severe pain in the legs after injections.  Ibrance with letrozole starting 01/12/2018     CURRENT THERAPY: Letrozole and Ibrance  INTERVAL HISTORY: Elizabeth [name] 62 y.o.  female returns for evaluation of her metastatic breast cancer.  She continues on Letrozole and Ibrance.  She notes some hot flashes and arthralgias from the Letrozole and is managing these.  The Ibrance she notes some fatigue and increased shortness of breath with exertion.  All of these she has been managing and are stable.  She is in her off week and her last dose of Ibrance was one week ago.    She received Zometa for the first time last week.  She notes that the first three days after the Zometa she had achiness in her legs in the bones and muscles.  This has since resolved, and she managed this with heat. She also had some mouth sores.  She had good oral hygiene which helped her mouth.   Elizabeth [name] has Percocet and Tramadol on her medication list.  She has not taken these in a very long time.  She has ran out of these medications.  She is now taking Advil at night for pain which is working for her pain.  She thinks she may need a Tramadol refill prior to receiving Zometa again.     Patient Active Problem List   Diagnosis Date Noted  . Bone metastasis ([facility]) 12/22/2017  . Primary osteoarthritis of left hip 05/26/2017  . Osteoarthritis of left hip 05/23/2017  . Breast cancer of lower-outer quadrant of left female breast ([facility]) 08/06/2012    is allergic to shellfish allergy and sulfa antibiotics.  MEDICAL HISTORY: Past Medical  History:  Diagnosis Date  . Arthritis   . Breast cancer ([facility]) 2010   Left  . Chronic sinusitis   . Family history of adverse reaction to anesthesia    sister-nausea/vomiting  . History of chemotherapy 01/2009-03/2009  . Hypercholesteremia   . Hypothyroid    "inactive thyroid"  . Migraines   . RSD (reflex sympathetic dystrophy)     SURGICAL HISTORY: Past Surgical History:  Procedure Laterality Date  . BREAST RECONSTRUCTION    . EYE SURGERY Bilateral 7/18, 10/18  . KNEE ARTHROSCOPY Bilateral   . KNEE SURGERY Right    Two surgeries  . MASTECTOMY Bilateral  05/22/2009  . MASTECTOMY Bilateral 2011   restrictions on left arm-no labs/bloodpressure  . NASAL SINUS SURGERY    . PORTA CATH INSERTION  12/2008  . PORTA CATH REMOVAL  05/2009  . ROOT CANAL    . SHOULDER SURGERY Right   . TOTAL HIP ARTHROPLASTY Left 05/26/2017   Procedure: TOTAL HIP ARTHROPLASTY ANTERIOR APPROACH;  Surgeon: Frederik Pear, MD;  Location: [facility];  Service: Orthopedics;  Laterality: Left;    SOCIAL HISTORY: Social History   Socioeconomic History  . Marital status: Married    Spouse name: Not on file  . Number of children: Not on file  . Years of education: Not on file  . Highest education level: Not on file  Occupational History  . Not on file  Social Needs  . Financial resource strain: Not on file  . Food insecurity:    Worry: Not on file    Inability: Not on file  . Transportation needs:    Medical: Not on file    Non-medical: Not on file  Tobacco Use  . Smoking status: Never Smoker  . Smokeless tobacco: Never Used  Substance and Sexual Activity  . Alcohol use: No  . Drug use: No  . Sexual activity: Yes    Birth control/protection: Post-menopausal  Lifestyle  . Physical activity:    Days per week: Not on file    Minutes per session: Not on file  . Stress: Not on file  Relationships  . Social connections:    Talks on phone: Not on file    Gets together: Not on file    Attends religious service: Not on file    Active member of club or organization: Not on file    Attends meetings of clubs or organizations: Not on file    Relationship status: Not on file  . Intimate partner violence:    Fear of current or ex partner: Not on file    Emotionally abused: Not on file    Physically abused: Not on file    Forced sexual activity: Not on file  Other Topics Concern  . Not on file  Social History Narrative  . Not on file    FAMILY HISTORY: Family History  Problem Relation Age of Onset  . Hypertension Mother     Review of Systems  Constitutional:  Positive for fatigue. Negative for appetite change, chills, fever and unexpected weight change.  HENT:   Negative for hearing loss, lump/mass and trouble swallowing.   Respiratory: Positive for shortness of breath (with activity). Negative for chest tightness, cough and wheezing.   Cardiovascular: Negative for chest pain, leg swelling and palpitations.  Gastrointestinal: Negative for abdominal distention, abdominal pain, diarrhea, nausea and vomiting.  Genitourinary: Positive for difficulty urinating and nocturia.   Musculoskeletal: Negative for arthralgias.  Skin: Negative for itching and rash.  Neurological: Negative  for dizziness, extremity weakness, headaches and numbness.  Hematological: Negative for adenopathy. Does not bruise/bleed easily.  Psychiatric/Behavioral: Negative for depression. The patient is not nervous/anxious.       PHYSICAL EXAMINATION  ECOG PERFORMANCE STATUS: 1 - Symptomatic but completely ambulatory  Vitals:   03/18/18 0920  BP: (!) 141/93  Pulse: (!) 107  Resp: 16  Temp: 97.8 F (36.6 C)  SpO2: 99%    Physical Exam  Constitutional: She is oriented to person, place, and time. She appears well-developed and well-nourished.  HENT:  Head: Normocephalic and atraumatic.  Mouth/Throat: Oropharynx is clear and moist. No oropharyngeal exudate.  Eyes: Pupils are equal, round, and reactive to light. No scleral icterus.  Neck: Neck supple.  Cardiovascular: Normal rate and regular rhythm.  Pulmonary/Chest: Effort normal and breath sounds normal. No respiratory distress.  Abdominal: Soft. Bowel sounds are normal. She exhibits no distension and no mass. There is no tenderness. There is no rebound and no guarding.  Musculoskeletal: She exhibits no edema.  Lymphadenopathy:    She has no cervical adenopathy.  Neurological: She is alert and oriented to person, place, and time.  Skin: Skin is warm and dry. Capillary refill takes less than 2 seconds.  Psychiatric: She  has a normal mood and affect.    LABORATORY DATA:  CBC    Component Value Date/Time   WBC 1.7 (L) 03/18/2018 0853   WBC 4.1 12/11/2017 0641   RBC 3.53 (L) 03/18/2018 0853   HGB 11.6 (L) 03/18/2018 0853   HGB 13.0 04/11/2014 0804   HCT 34.8 (L) 03/18/2018 0853   HCT 39.5 04/11/2014 0804   PLT 186 03/18/2018 0853   PLT 359 04/11/2014 0804   MCV 98.6 03/18/2018 0853   MCV 93.4 04/11/2014 0804   MCH 32.9 03/18/2018 0853   MCHC 33.3 03/18/2018 0853   RDW 16.4 (H) 03/18/2018 0853   RDW 13.1 04/11/2014 0804   LYMPHSABS 0.5 (L) 03/18/2018 0853   LYMPHSABS 1.8 04/11/2014 0804   MONOABS 0.2 03/18/2018 0853   MONOABS 0.5 04/11/2014 0804   EOSABS 0.0 03/18/2018 0853   EOSABS 0.2 04/11/2014 0804   BASOSABS 0.1 03/18/2018 0853   BASOSABS 0.0 04/11/2014 0804    CMP     Component Value Date/Time   NA 141 01/28/2018 1504   NA 142 04/11/2014 0804   K 4.5 01/28/2018 1504   K 4.0 04/11/2014 0804   CL 105 01/28/2018 1504   CL 105 08/06/2012 1313   CO2 26 01/28/2018 1504   CO2 28 04/11/2014 0804   GLUCOSE 99 01/28/2018 1504   GLUCOSE 82 04/11/2014 0804   GLUCOSE 100 (H) 08/06/2012 1313   BUN 20 01/28/2018 1504   BUN 13.4 04/11/2014 0804   CREATININE 1.56 (H) 01/28/2018 1504   CREATININE 0.8 04/11/2014 0804   CALCIUM 9.6 01/28/2018 1504   CALCIUM 9.1 04/11/2014 0804   PROT 7.0 01/28/2018 1504   PROT 6.6 04/11/2014 0804   ALBUMIN 3.7 01/28/2018 1504   ALBUMIN 3.7 04/11/2014 0804   AST 23 01/28/2018 1504   AST 16 04/11/2014 0804   ALT 18 01/28/2018 1504   ALT 16 04/11/2014 0804   ALKPHOS 103 01/28/2018 1504   ALKPHOS 91 04/11/2014 0804   BILITOT 0.3 01/28/2018 1504   BILITOT 0.48 04/11/2014 0804   GFRNONAA 34 (L) 01/28/2018 1504   GFRAA 40 (L) 01/28/2018 1504            ASSESSMENT and THERAPY PLAN:   Breast cancer of lower-outer quadrant of  left female breast Left breast cancer invasive ductal carcinoma 2.5 cm in size grade 1, ER 99%, PR 41%, Ki-67 11%, HER-2  negative T2 N0 M0 stage II a status post neoadjuvant chemotherapy followed by surgery February 2011and wason antiestrogen therapy with Femara from January 2011-July 2017  New onset mid back pain: MRI revealedT11 severe pathologic fracture, abnormal signal T10, T11 and T12, moderate canal stenosis at T11  12/02/2017: PET CT scan hypermetabolic lymphadenopathy in the thoracic and upper abdominal lymph nodes, spine and rib metastases, intramuscular metastases the left pectoralis muscle, T11 vertebral fracture pathologic  Biopsy of the T11 vertebral body  ER 90%, PR 0%, Ki-67 10%, HER-2 -1+, foundation 1, PDL 1 pending  Pathologic fracture T11: She met with orthopedics regarding kyphoplasty and they determined that there is no surgical options available for her. ------------------------------------------------------------------- Treatment : 1. Palliative radiation to T11 vertebral body and the pectoralis muscle: 12/20/17-12/26/17 2.  current treatment: Ibrance with letrozole along with Zometa Patient could not tolerate Faslodex because of intense bone pain as well as hypotension.  Ibrance toxicities: 1.ANC:  Continue with current dosage of 100 mg Ibrance per Dr. Lindi Adie, reviewed [place] today of 0.9 2. Fatigue 3. Urinary hesitancy: will get urinalysis and culture today  Zometa toxicities:  1. Myalgias and Arthralgias, resolved after 4 days.    Scans ordered for 3 weeks from now, f/u with Dr. Lindi Adie in 4 weeks for lab, and eval.   Orders Placed This Encounter  Procedures  . Urine Culture    Standing Status:   Future    Standing Expiration Date:   03/18/2019  . CT Abdomen Pelvis W Contrast    Standing Status:   Future    Standing Expiration Date:   03/18/2019    Order Specific Question:   If indicated for the ordered procedure, I authorize the administration of contrast media per Radiology protocol    Answer:   Yes    Order Specific Question:   Preferred imaging location?     Answer:   [facility]    Order Specific Question:   Is Oral Contrast requested for this exam?    Answer:   Yes, Per Radiology protocol    Order Specific Question:   Radiology Contrast Protocol - do NOT remove file path    Answer:   \\[email or web address]  . CT Chest W Contrast    Standing Status:   Future    Standing Expiration Date:   03/18/2019    Order Specific Question:   If indicated for the ordered procedure, I authorize the administration of contrast media per Radiology protocol    Answer:   Yes    Order Specific Question:   Preferred imaging location?    Answer:   [facility]    Order Specific Question:   Radiology Contrast Protocol - do NOT remove file path    Answer:   \\[email or web address]  . NM Bone Scan Whole Body    Standing Status:   Future    Standing Expiration Date:   03/18/2019    Order Specific Question:   If indicated for the ordered procedure, I authorize the administration of a radiopharmaceutical per Radiology protocol    Answer:   Yes    Order Specific Question:   Preferred imaging location?    Answer:   [facility]    Order Specific Question:   Radiology Contrast Protocol - do NOT remove file path    Answer:   \\[email or web address]  . Urinalysis,  Complete w Microscopic    Standing Status:   Future    Standing Expiration Date:   03/19/2019  . CMP ([facility] only)    Standing Status:   Standing    Number of Occurrences:   52    Standing Expiration Date:   03/19/2019    All questions were answered. The patient knows to call the clinic with any problems, questions or concerns. We can certainly see the patient much sooner if necessary.  A total of (30) minutes of face-to-face time was spent with this patient with greater than 50% of that time in counseling and care-coordination.  This note was electronically signed. Scot Dock, NP 03/18/2018

## 2018-03-18 NOTE — Assessment & Plan Note (Addendum)
Left breast cancer invasive ductal carcinoma 2.5 cm in size grade 1, ER 99%, PR 41%, Ki-67 11%, HER-2 negative T2 N0 M0 stage II a status post neoadjuvant chemotherapy followed by surgery February 2011and wason antiestrogen therapy with Femara from January 2011-July 2017  New onset mid back pain: MRI revealedT11 severe pathologic fracture, abnormal signal T10, T11 and T12, moderate canal stenosis at T11  12/02/2017: PET CT scan hypermetabolic lymphadenopathy in the thoracic and upper abdominal lymph nodes, spine and rib metastases, intramuscular metastases the left pectoralis muscle, T11 vertebral fracture pathologic  Biopsy of the T11 vertebral body  ER 90%, PR 0%, Ki-67 10%, HER-2 -1+, foundation 1, PDL 1 pending  Pathologic fracture T11: She met with orthopedics regarding kyphoplasty and they determined that there is no surgical options available for her. ------------------------------------------------------------------- Treatment : 1. Palliative radiation to T11 vertebral body and the pectoralis muscle: 12/20/17-12/26/17 2.  current treatment: Ibrance with letrozole along with Zometa Patient could not tolerate Faslodex because of intense bone pain as well as hypotension.  Ibrance toxicities: 1.ANC:  Continue with current dosage of 100 mg Ibrance per Dr. Lindi Adie, reviewed [place] today of 0.9 2. Fatigue 3. Urinary hesitancy: will get urinalysis and culture today  Zometa toxicities:  1. Myalgias and Arthralgias, resolved after 4 days.    Scans ordered for 3 weeks from now, f/u with Dr. Lindi Adie in 4 weeks for lab, and eval.

## 2018-03-18 NOTE — Telephone Encounter (Signed)
Gave patient avs report and appointments for December and February

## 2018-03-18 NOTE — Telephone Encounter (Signed)
[facility] will call re scan. Patient will need to get prep prior to scan.

## 2018-03-19 LAB — URINE CULTURE: Culture: <10000 — AB

## 2018-03-24 ENCOUNTER — Telehealth: Payer: Self-pay | Admitting: *Deleted

## 2018-03-24 NOTE — Telephone Encounter (Signed)
Medical records faxed to [place], [place]; RID [phone]

## 2018-03-25 ENCOUNTER — Telehealth: Payer: Self-pay | Admitting: *Deleted

## 2018-03-25 NOTE — Telephone Encounter (Signed)
Medical records faxed to [place], RN [facility]; RID [phone]

## 2018-03-30 ENCOUNTER — Telehealth: Payer: Self-pay

## 2018-03-30 NOTE — Telephone Encounter (Signed)
TC to Pt. To return call in reference to lab results

## 2018-04-02 NOTE — Progress Notes (Signed)
Patient Care Team: Redmon, Barth Kirks, PA-C as PCP - General (Nurse Practitioner)  DIAGNOSIS:    ICD-10-CM   1. Malignant neoplasm of lower-outer quadrant of left breast of female, estrogen receptor positive ([place]) C50.512    Z17.0     SUMMARY OF ONCOLOGIC HISTORY:   Breast cancer of lower-outer quadrant of left female breast ([place])   12/13/2008 Initial Diagnosis    Left breast biopsy: Invasive ductal carcinoma ER 90% percent, PR 41%, Ki-67 11%, HER-2 negative ratio 1, Oncotype DX score 23, ROR15%    01/27/2009 - 03/10/2009 Neo-Adjuvant Chemotherapy    Neoadjuvant FEC 4; participant in the "bed sheets" study.    05/12/2009 -  Anti-estrogen oral therapy    Femara 2.5 mg daily    05/22/2009 Surgery    Bilateral mastectomy stage IIB T2 N0 M0 IDC left breast grade 1, 2.5 cm, ER 99%, PR 41%, Ki-67 11%, HER-2 negative    12/10/2009 Surgery    Breast reconstruction surgery    11/14/2017 Relapse/Recurrence    Mid back pain: MRI revealed T11 severe pathologic fracture, abnormal signal T10, T11 and T12, moderate canal stenosis at T11    12/03/2017 PET scan    Hypermetabolic metastatic breast cancer involving thoracic and upper abdominal lymph nodes, spine and ribs. There may be hypermetabolic adenopathy in the left neck; Hypermetabolic lymph node versus intramuscular metastasis involving the medial left pectoralis musculature Pathologic T11 fracture      12/11/2017 Procedure    Biopsy of T11 vertebral body lytic lesion: Metastatic breast adenocarcinoma ER positive, PR negative    12/19/2017 - 12/26/2017 Radiation Therapy    Palliative XRT to T 11    01/12/2018 -  Anti-estrogen oral therapy    Patient could not tolerate Faslodex because of severe hypotension and severe pain in the legs after injections.  Ibrance with letrozole starting 01/12/2018     CHIEF COMPLIANT: Follow-up on Ibrance with letrozole  INTERVAL HISTORY: Elizabeth [name] is a 62 y.o. with above-mentioned history of metastatic  breast cancer with bone metastases. She is currently on treatment with Ibrance and letrozole. Her most recent CT and bone scan on 04/09/18 showed a reduction in the size of lymph nodes and bone metastases in the ribs and spine. She presents to the clinic today alone. Following her most recent Zometa shot she had muscle pain for three days and mouth sores which have since improved. She has not taken any pain medication.   REVIEW OF SYSTEMS:   Constitutional: Denies fevers, chills or abnormal weight loss Eyes: Denies blurriness of vision Ears, nose, mouth, throat, and face: Denies mucositis or sore throat (+) mouth sores, resolved Respiratory: Denies cough, dyspnea or wheezes Cardiovascular: Denies palpitation, chest discomfort Gastrointestinal:  Denies nausea, heartburn or change in bowel habits Skin: Denies abnormal skin rashes MSK: (+) muscle pain, resolved Lymphatics: Denies new lymphadenopathy or easy bruising Neurological:Denies numbness, tingling or new weaknesses Behavioral/Psych: Mood is stable, no new changes  Extremities: No lower extremity edema Breast: denies any pain or lumps or nodules in either breasts All other systems were reviewed with the patient and are negative.  I have reviewed the past medical history, past surgical history, social history and family history with the patient and they are unchanged from previous note.  ALLERGIES:  is allergic to shellfish allergy and sulfa antibiotics.  MEDICATIONS:  Current Outpatient Medications  Medication Sig Dispense Refill  . Biotin 10000 MCG TABS Take 10,000 mcg by mouth daily.     . Cholecalciferol (VITAMIN  D PO) Take 5,000 Units by mouth daily.     [name] 100 MG capsule TAKE 1 CAPSULE BY MOUTH  DAILY WITH BREAKFAST. TAKE  WHOLE WITH FOOD. TAKE FOR  21 DAYS ON, 7 DAYS OFF,  REPEAT EVERY 28 DAYS. 21 capsule 2  . letrozole (FEMARA) 2.5 MG tablet Take 1 tablet (2.5 mg total) by mouth daily. 90 tablet 3  . levothyroxine  (SYNTHROID, LEVOTHROID) 25 MCG tablet Take 25 mcg by mouth daily before breakfast.     . Multiple Vitamin (MULTIVITAMIN) tablet Take 1 tablet by mouth daily.    . Omega-3 Fatty Acids (SUPER OMEGA 3 EPA/DHA PO) Take 1 capsule by mouth daily.    . ondansetron (ZOFRAN) 8 MG tablet Take 1 tablet (8 mg total) by mouth every 8 (eight) hours as needed for nausea or vomiting. 30 tablet 1  . oxyCODONE-acetaminophen (PERCOCET/ROXICET) 5-325 MG tablet Take 1 tablet by mouth every 8 (eight) hours as needed for severe pain. 60 tablet 0  . prednisoLONE acetate (PRED FORTE) 1 % ophthalmic suspension Place 1 drop into both eyes daily.    . traMADol (ULTRAM) 50 MG tablet Take 1 tablet (50 mg total) by mouth every 6 (six) hours as needed. 30 tablet 0   No current facility-administered medications for this visit.     PHYSICAL EXAMINATION: ECOG PERFORMANCE STATUS: 1 - Symptomatic but completely ambulatory  Vitals:   04/14/18 1122  BP: 138/90  Pulse: 92  Resp: 18  Temp: 98.3 F (36.8 C)  SpO2: 100%   Filed Weights   04/14/18 1122  Weight: 123 lb 12.8 oz (56.2 kg)    GENERAL:alert, no distress and comfortable SKIN: skin color, texture, turgor are normal, no rashes or significant lesions EYES: normal, Conjunctiva are pink and non-injected, sclera clear OROPHARYNX:no exudate, no erythema and lips, buccal mucosa, and tongue normal  NECK: supple, thyroid normal size, non-tender, without nodularity LYMPH:  no palpable lymphadenopathy in the cervical, axillary or inguinal LUNGS: clear to auscultation and percussion with normal breathing effort HEART: regular rate & rhythm and no murmurs and no lower extremity edema ABDOMEN:abdomen soft, non-tender and normal bowel sounds MUSCULOSKELETAL:no cyanosis of digits and no clubbing  NEURO: alert & oriented x 3 with fluent speech, no focal motor/sensory deficits EXTREMITIES: No lower extremity edema  LABORATORY DATA:  I have reviewed the data as listed CMP  Latest Ref Rng & Units 04/14/2018 03/18/2018 01/28/2018  Glucose 70 - 99 mg/dL 91 90 99  BUN 8 - 23 mg/dL '17 15 20  ' Creatinine 0.44 - 1.00 mg/dL 0.86 0.82 1.56(H)  Sodium 135 - 145 mmol/L 141 142 141  Potassium 3.5 - 5.1 mmol/L 4.3 4.1 4.5  Chloride 98 - 111 mmol/L 107 107 105  CO2 22 - 32 mmol/L '24 26 26  ' Calcium 8.9 - 10.3 mg/dL 9.2 9.2 9.6  Total Protein 6.5 - 8.1 g/dL 7.1 7.3 7.0  Total Bilirubin 0.3 - 1.2 mg/dL 0.5 0.3 0.3  Alkaline Phos 38 - 126 U/L 77 101 103  AST 15 - 41 U/L '18 22 23  ' ALT 0 - 44 U/L '13 12 18    ' Lab Results  Component Value Date   WBC 2.2 (L) 04/14/2018   HGB 11.2 (L) 04/14/2018   HCT 33.9 (L) 04/14/2018   MCV 100.3 (H) 04/14/2018   PLT 160 04/14/2018   NEUTROABS 1.4 (L) 04/14/2018    ASSESSMENT & PLAN:  Breast cancer of lower-outer quadrant of left female breast Left breast cancer invasive ductal  carcinoma 2.5 cm in size grade 1, ER 99%, PR 41%, Ki-67 11%, HER-2 negative T2 N0 M0 stage II a status post neoadjuvant chemotherapy followed by surgery February 2011and wason antiestrogen therapy with Femara from January 2011-July 2017  New onset mid back pain: MRI revealedT11 severe pathologic fracture, abnormal signal T10, T11 and T12, moderate canal stenosis at T11  12/02/2017: PET CT scan hypermetabolic lymphadenopathy in the thoracic and upper abdominal lymph nodes, spine and rib metastases, intramuscular metastases the left pectoralis muscle, T11 vertebral fracture pathologic  Biopsy of the T11 vertebral body  ER 90%, PR 0%, Ki-67 10%, HER-2 -1+, foundation 1, PDL 1 pending  Pathologic fracture T11: She met with orthopedics regarding kyphoplasty and they determined that there is no surgical options available for her. ------------------------------------------------------------------- Treatment : 1. Palliative radiation to T11 vertebral body and the pectoralis muscle: 12/20/17-12/26/17 2. current treatment: Ibrance with letrozole along with Delton See  or Zometa Patient could not tolerate Faslodex because of intense bone pain as well as hypotension.  Ibrance toxicities: 1.ANC:1.4 Continue with current dosage of 100 mg Ibrance  Zometa every 3 months.   04/09/2018 bone scan: Foci of increased tracer uptake in left ribs and lower thoracic spine consistent with bone metastases. 04/09/2018: CT CAP: Reduced size of the several lymph nodes in the chest, reduced size of the abdominal lymph nodes.  No new bone lesions, large left pleural effusion, left suprahilar airspace opacity 3.5 x 3 cm  Based on the scans, I recommended continuation of current treatment. Pleural effusion: I discussed with her about thoracentesis.  Return to clinic for labs and follow-up to coincide with Zometa. Patient will require narcotic pain medication for 3 to 4 days after Zometa infusion.  She will also need a Magic mouthwash because she gets mouth sores from Zometa.  Our plan is to do scans every 6 months going forward.  No orders of the defined types were placed in this encounter.  The patient has a good understanding of the overall plan. she agrees with it. she will call with any problems that may develop before the next visit here.  Nicholas Lose, MD 04/14/2018   I, Cloyde Reams Dorshimer, am acting as scribe for Nicholas Lose, MD.  I have reviewed the above documentation for accuracy and completeness, and I agree with the above.

## 2018-04-09 ENCOUNTER — Encounter (HOSPITAL_COMMUNITY)
Admission: RE | Admit: 2018-04-09 | Discharge: 2018-04-09 | Disposition: A | Discharge status: Home / Self Care | Payer: 59 | Source: Ambulatory Visit | Attending: Adult Health | Admitting: Adult Health

## 2018-04-09 ENCOUNTER — Ambulatory Visit (HOSPITAL_COMMUNITY)
Admission: RE | Admit: 2018-04-09 | Discharge: 2018-04-09 | Disposition: A | Discharge status: Home / Self Care | Payer: 59 | Source: Ambulatory Visit | Attending: Adult Health | Admitting: Adult Health

## 2018-04-09 DIAGNOSIS — C7951 Secondary malignant neoplasm of bone: Secondary | ICD-10-CM

## 2018-04-09 DIAGNOSIS — Z170 Estrogen receptor positive status [ER+]: Secondary | ICD-10-CM | POA: Insufficient documentation

## 2018-04-09 DIAGNOSIS — C50512 Malignant neoplasm of lower-outer quadrant of left female breast: Secondary | ICD-10-CM

## 2018-04-09 DIAGNOSIS — C50912 Malignant neoplasm of unspecified site of left female breast: Secondary | ICD-10-CM | POA: Diagnosis not present

## 2018-04-09 MED ORDER — SODIUM CHLORIDE (PF) 0.9 % IJ SOLN
INTRAMUSCULAR | Status: AC
  Filled 2018-04-09: qty 50

## 2018-04-09 MED ORDER — IOHEXOL 300 MG/ML  SOLN
100.0000 mL | Freq: Once | INTRAMUSCULAR | Status: AC | PRN
  Administered 2018-04-09: 100 mL via INTRAVENOUS

## 2018-04-09 MED ORDER — TECHNETIUM TC 99M MEDRONATE IV KIT
20.0000 | PACK | Freq: Once | INTRAVENOUS | Status: AC | PRN
  Administered 2018-04-09: 20.7 via INTRAVENOUS

## 2018-04-14 ENCOUNTER — Inpatient Hospital Stay (HOSPITAL_BASED_OUTPATIENT_CLINIC_OR_DEPARTMENT_OTHER): Payer: 59 | Admitting: Hematology and Oncology

## 2018-04-14 ENCOUNTER — Inpatient Hospital Stay: Payer: 59

## 2018-04-14 DIAGNOSIS — Z79811 Long term (current) use of aromatase inhibitors: Secondary | ICD-10-CM | POA: Diagnosis not present

## 2018-04-14 DIAGNOSIS — Z170 Estrogen receptor positive status [ER+]: Principal | ICD-10-CM

## 2018-04-14 DIAGNOSIS — C7951 Secondary malignant neoplasm of bone: Secondary | ICD-10-CM | POA: Diagnosis not present

## 2018-04-14 DIAGNOSIS — C50512 Malignant neoplasm of lower-outer quadrant of left female breast: Secondary | ICD-10-CM

## 2018-04-14 DIAGNOSIS — R3911 Hesitancy of micturition: Secondary | ICD-10-CM

## 2018-04-14 LAB — CBC WITH DIFFERENTIAL (CANCER CENTER ONLY)
Abs Immature Granulocytes: 0.01 10*3/uL (ref 0.00–0.07)
Basophils Absolute: 0 10*3/uL (ref 0.0–0.1)
Basophils Relative: 1 %
Eosinophils Absolute: 0 10*3/uL (ref 0.0–0.5)
Eosinophils Relative: 1 %
HCT: 33.9 % — ABNORMAL LOW (ref 36.0–46.0)
Hemoglobin: 11.2 g/dL — ABNORMAL LOW (ref 12.0–15.0)
Immature Granulocytes: 1 %
LYMPHS ABS: 0.5 10*3/uL — AB (ref 0.7–4.0)
Lymphocytes Relative: 25 %
MCH: 33.1 pg (ref 26.0–34.0)
MCHC: 33 g/dL (ref 30.0–36.0)
MCV: 100.3 fL — ABNORMAL HIGH (ref 80.0–100.0)
Monocytes Absolute: 0.2 10*3/uL (ref 0.1–1.0)
Monocytes Relative: 9 %
Neutro Abs: 1.4 10*3/uL — ABNORMAL LOW (ref 1.7–7.7)
Neutrophils Relative %: 63 %
Platelet Count: 160 10*3/uL (ref 150–400)
RBC: 3.38 MIL/uL — ABNORMAL LOW (ref 3.87–5.11)
RDW: 15.9 % — ABNORMAL HIGH (ref 11.5–15.5)
WBC Count: 2.2 10*3/uL — ABNORMAL LOW (ref 4.0–10.5)
nRBC: 0 % (ref 0.0–0.2)

## 2018-04-14 LAB — CMP (CANCER CENTER ONLY)
ALBUMIN: 3.8 g/dL (ref 3.5–5.0)
ALT: 13 U/L (ref 0–44)
AST: 18 U/L (ref 15–41)
Alkaline Phosphatase: 77 U/L (ref 38–126)
Anion gap: 10 (ref 5–15)
BUN: 17 mg/dL (ref 8–23)
CO2: 24 mmol/L (ref 22–32)
Calcium: 9.2 mg/dL (ref 8.9–10.3)
Chloride: 107 mmol/L (ref 98–111)
Creatinine: 0.86 mg/dL (ref 0.44–1.00)
GFR, Est AFR Am: >60 mL/min (ref >60)
GFR, Estimated: >60 mL/min (ref >60)
GLUCOSE: 91 mg/dL (ref 70–99)
POTASSIUM: 4.3 mmol/L (ref 3.5–5.1)
SODIUM: 141 mmol/L (ref 135–145)
Total Bilirubin: 0.5 mg/dL (ref 0.3–1.2)
Total Protein: 7.1 g/dL (ref 6.5–8.1)

## 2018-04-14 NOTE — Assessment & Plan Note (Signed)
Left breast cancer invasive ductal carcinoma 2.5 cm in size grade 1, ER 99%, PR 41%, Ki-67 11%, HER-2 negative T2 N0 M0 stage II a status post neoadjuvant chemotherapy followed by surgery February 2011and wason antiestrogen therapy with Femara from January 2011-July 2017  New onset mid back pain: MRI revealedT11 severe pathologic fracture, abnormal signal T10, T11 and T12, moderate canal stenosis at T11  12/02/2017: PET CT scan hypermetabolic lymphadenopathy in the thoracic and upper abdominal lymph nodes, spine and rib metastases, intramuscular metastases the left pectoralis muscle, T11 vertebral fracture pathologic  Biopsy of the T11 vertebral body  ER 90%, PR 0%, Ki-67 10%, HER-2 -1+, foundation 1, PDL 1 pending  Pathologic fracture T11: She met with orthopedics regarding kyphoplasty and they determined that there is no surgical options available for her. ------------------------------------------------------------------- Treatment : 1. Palliative radiation to T11 vertebral body and the pectoralis muscle: 12/20/17-12/26/17 2. current treatment: Ibrance with letrozole along with Delton See or Zometa Patient could not tolerate Faslodex because of intense bone pain as well as hypotension.  Ibrance toxicities: 1.ANC: Continue with current dosage of 100 mg Ibrance  Zometa every 3 months.   04/09/2018 bone scan: Foci of increased tracer uptake in left ribs and lower thoracic spine consistent with bone metastases. 04/09/2018: CT CAP: Reduced size of the several lymph nodes in the chest, reduced size of the abdominal lymph nodes.  No new bone lesions, large left pleural effusion, left suprahilar airspace opacity 3.5 x 3 cm  Based on the scans, I recommended continuation of current treatment. Pleural effusion: I discussed with her about thoracentesis.  Return to clinic for labs and follow-up to coincide with Zometa.

## 2018-04-24 DIAGNOSIS — Z9842 Cataract extraction status, left eye: Secondary | ICD-10-CM | POA: Diagnosis not present

## 2018-04-24 DIAGNOSIS — Z9841 Cataract extraction status, right eye: Secondary | ICD-10-CM | POA: Diagnosis not present

## 2018-04-24 DIAGNOSIS — Z947 Corneal transplant status: Secondary | ICD-10-CM | POA: Diagnosis not present

## 2018-05-05 ENCOUNTER — Telehealth: Payer: Self-pay | Admitting: *Deleted

## 2018-05-05 NOTE — Telephone Encounter (Signed)
Medical records faxed to [facility]; RI# [id number]

## 2018-05-06 ENCOUNTER — Telehealth: Payer: Self-pay

## 2018-05-06 NOTE — Telephone Encounter (Signed)
Patient called to report severe diarrhea.   Patient unable to tolerate any fluids PO due to increasing urge of diarrhea.  Patient reported her granddaughter had diarrhea a few days ago, and pt was being care taker.  Patient denies any fever.  Requesting advice only.   Nurse encouraged patient to try OTC imodium for diarrhea, along with Gatorade to prevent potential dehydration.  Nurse educated patient on replacing body fluids, pt voiced understanding.    Pt concerned about taking Ibrance tomorrow morning since instructions are with food and at this time she is unable to tolerate food.  Nurse encouraged patient to utilize above advice and call back tomorrow to update on systems prior to taking Ibrance.  Patient voiced understanding, no further needs at this time.

## 2018-05-21 ENCOUNTER — Other Ambulatory Visit: Payer: Self-pay

## 2018-05-21 MED ORDER — AZITHROMYCIN 250 MG PO TABS: ORAL_TABLET | ORAL | 0 refills | Status: DC

## 2018-05-21 NOTE — Progress Notes (Signed)
Pt called to report that she has been experiencing a severe cold and sinus congestion and post nasal drainage for the past 2 weeks now. She had restarted back on 100mg  Ibrance x 1 week ago. She had experienced some shortness of breath over the past few days due constant congestion, post nasal drip and cough. She states that she had been using incentive spirometry at home and is not able to take a full deep breath. Pt bough robitussin and has been taking this for 1-2 days with some improvement with her cough and congestion, however, she is still unable to expectorate anything out. She does have a nonproductive moist cough.   Pt denies cp and fever/chills at this time, and has felt better than the last few days, after taking theraflu and robitussin. Suggested that pt drink plenty of fluids and to take mucinex to help loosen chest congestion, and claritin to help with sinus congestion. Pt verbalized understanding.  Discussed with Dr.Gudena and obtained order for zpack x 5 days if pt experience onset of fever/chills over the weekend. Pt coming back on Monday for lab check and will evaluate if pt will need to hold off on ibrance dose. Pt verbalized understanding and will call with any further concerns.

## 2018-05-25 ENCOUNTER — Telehealth: Payer: Self-pay | Admitting: Hematology and Oncology

## 2018-05-25 ENCOUNTER — Inpatient Hospital Stay: Payer: 59 | Attending: Hematology and Oncology

## 2018-05-25 ENCOUNTER — Inpatient Hospital Stay: Payer: 59

## 2018-05-25 ENCOUNTER — Ambulatory Visit (HOSPITAL_COMMUNITY)
Admission: RE | Admit: 2018-05-25 | Discharge: 2018-05-25 | Disposition: A | Discharge status: Home / Self Care | Payer: 59 | Source: Ambulatory Visit | Attending: Hematology and Oncology | Admitting: Hematology and Oncology

## 2018-05-25 ENCOUNTER — Inpatient Hospital Stay (HOSPITAL_BASED_OUTPATIENT_CLINIC_OR_DEPARTMENT_OTHER): Payer: 59 | Admitting: Hematology and Oncology

## 2018-05-25 VITALS — BP 138/81 | HR 96 | Temp 98.5°F | Resp 17 | Ht 64.0 in | Wt 124.6 lb

## 2018-05-25 DIAGNOSIS — C50512 Malignant neoplasm of lower-outer quadrant of left female breast: Secondary | ICD-10-CM | POA: Insufficient documentation

## 2018-05-25 DIAGNOSIS — Z170 Estrogen receptor positive status [ER+]: Secondary | ICD-10-CM

## 2018-05-25 DIAGNOSIS — R0602 Shortness of breath: Secondary | ICD-10-CM

## 2018-05-25 DIAGNOSIS — C7951 Secondary malignant neoplasm of bone: Secondary | ICD-10-CM | POA: Insufficient documentation

## 2018-05-25 DIAGNOSIS — R05 Cough: Secondary | ICD-10-CM | POA: Diagnosis not present

## 2018-05-25 DIAGNOSIS — R3911 Hesitancy of micturition: Secondary | ICD-10-CM

## 2018-05-25 LAB — CMP (CANCER CENTER ONLY)
ALBUMIN: 3.8 g/dL (ref 3.5–5.0)
ALT: 13 U/L (ref 0–44)
AST: 22 U/L (ref 15–41)
Alkaline Phosphatase: 77 U/L (ref 38–126)
Anion gap: 9 (ref 5–15)
BUN: 16 mg/dL (ref 8–23)
CO2: 26 mmol/L (ref 22–32)
CREATININE: 0.9 mg/dL (ref 0.44–1.00)
Calcium: 9.2 mg/dL (ref 8.9–10.3)
Chloride: 107 mmol/L (ref 98–111)
GFR, Est AFR Am: >60 mL/min (ref >60)
GFR, Estimated: >60 mL/min (ref >60)
Glucose, Bld: 91 mg/dL (ref 70–99)
Potassium: 3.8 mmol/L (ref 3.5–5.1)
SODIUM: 142 mmol/L (ref 135–145)
Total Bilirubin: 0.7 mg/dL (ref 0.3–1.2)
Total Protein: 7.3 g/dL (ref 6.5–8.1)

## 2018-05-25 LAB — CBC WITH DIFFERENTIAL (CANCER CENTER ONLY)
Abs Immature Granulocytes: 0 10*3/uL (ref 0.00–0.07)
Basophils Absolute: 0.1 10*3/uL (ref 0.0–0.1)
Basophils Relative: 4 %
Eosinophils Absolute: 0.1 10*3/uL (ref 0.0–0.5)
Eosinophils Relative: 3 %
HCT: 36.1 % (ref 36.0–46.0)
HEMOGLOBIN: 12.2 g/dL (ref 12.0–15.0)
Immature Granulocytes: 0 %
LYMPHS PCT: 22 %
Lymphs Abs: 0.5 10*3/uL — ABNORMAL LOW (ref 0.7–4.0)
MCH: 34.2 pg — ABNORMAL HIGH (ref 26.0–34.0)
MCHC: 33.8 g/dL (ref 30.0–36.0)
MCV: 101.1 fL — ABNORMAL HIGH (ref 80.0–100.0)
Monocytes Absolute: 0.2 10*3/uL (ref 0.1–1.0)
Monocytes Relative: 8 %
Neutro Abs: 1.6 10*3/uL — ABNORMAL LOW (ref 1.7–7.7)
Neutrophils Relative %: 63 %
Platelet Count: 483 10*3/uL — ABNORMAL HIGH (ref 150–400)
RBC: 3.57 MIL/uL — ABNORMAL LOW (ref 3.87–5.11)
RDW: 15 % (ref 11.5–15.5)
WBC Count: 2.5 10*3/uL — ABNORMAL LOW (ref 4.0–10.5)
nRBC: 0 % (ref 0.0–0.2)

## 2018-05-25 MED ORDER — SODIUM CHLORIDE 0.9 % IV SOLN
Freq: Once | INTRAVENOUS | Status: AC
  Administered 2018-05-25: 11:00:00 via INTRAVENOUS
  Filled 2018-05-25: qty 250

## 2018-05-25 MED ORDER — MAGIC MOUTHWASH W/LIDOCAINE: 5.0000 mL | Freq: Three times a day (TID) | ORAL | 0 refills | Status: DC | PRN

## 2018-05-25 MED ORDER — TRAMADOL HCL 50 MG PO TABS: 50.0000 mg | ORAL_TABLET | Freq: Four times a day (QID) | ORAL | 0 refills | Status: DC | PRN

## 2018-05-25 MED ORDER — PALBOCICLIB 100 MG PO CAPS: 100.0000 mg | ORAL_CAPSULE | Freq: Every day | ORAL | 6 refills | Status: DC

## 2018-05-25 MED ORDER — ZOLEDRONIC ACID 4 MG/5ML IV CONC
3.0000 mg | Freq: Once | INTRAVENOUS | Status: AC
  Administered 2018-05-25: 3 mg via INTRAVENOUS
  Filled 2018-05-25: qty 3.75

## 2018-05-25 NOTE — Progress Notes (Signed)
Patient Care Team: [name] as PCP - General (Nurse Practitioner)  DIAGNOSIS:  Encounter Diagnoses  Name Primary?  . Malignant neoplasm of lower-outer quadrant of left breast of female, estrogen receptor positive ([place])   . Shortness of breath Yes    SUMMARY OF ONCOLOGIC HISTORY:   Breast cancer of lower-outer quadrant of left female breast ([place])   12/13/2008 Initial Diagnosis    Left breast biopsy: Invasive ductal carcinoma ER 90% percent, PR 41%, Ki-67 11%, HER-2 negative ratio 1, Oncotype DX score 23, ROR15%    01/27/2009 - 03/10/2009 Neo-Adjuvant Chemotherapy    Neoadjuvant FEC 4; participant in the "bed sheets" study.    05/12/2009 -  Anti-estrogen oral therapy    Femara 2.5 mg daily    05/22/2009 Surgery    Bilateral mastectomy stage IIB T2 N0 M0 IDC left breast grade 1, 2.5 cm, ER 99%, PR 41%, Ki-67 11%, HER-2 negative    12/10/2009 Surgery    Breast reconstruction surgery    11/14/2017 Relapse/Recurrence    Mid back pain: MRI revealed T11 severe pathologic fracture, abnormal signal T10, T11 and T12, moderate canal stenosis at T11    12/03/2017 PET scan    Hypermetabolic metastatic breast cancer involving thoracic and upper abdominal lymph nodes, spine and ribs. There may be hypermetabolic adenopathy in the left neck; Hypermetabolic lymph node versus intramuscular metastasis involving the medial left pectoralis musculature Pathologic T11 fracture      12/11/2017 Procedure    Biopsy of T11 vertebral body lytic lesion: Metastatic breast adenocarcinoma ER positive, PR negative    12/19/2017 - 12/26/2017 Radiation Therapy    Palliative XRT to T 11    01/12/2018 -  Anti-estrogen oral therapy    Patient could not tolerate Faslodex because of severe hypotension and severe pain in the legs after injections.  Ibrance with letrozole starting 01/12/2018     CHIEF COMPLIANT: Follow-up of metastatic breast cancer on Ibrance with letrozole  INTERVAL HISTORY: Elizabeth [name]  is a 63 year old with above-mentioned his metastatic breast cancer who is currently on Ibrance with letrozole.    She does have fatigue.  Denies any nausea or vomiting.  She had a severe postnasal drip and a cough over the weekend.  She however did not have any fevers and hence did not take any antibiotics.  She is still short of breath to minimal exertion.  REVIEW OF SYSTEMS:   Constitutional: Denies fevers, chills or abnormal weight loss Eyes: Denies blurriness of vision Ears, nose, mouth, throat, and face: Denies mucositis or sore throat Respiratory: Shortness of breath to minimal exertion, no wheezing Cardiovascular: Denies palpitation, chest discomfort Gastrointestinal:  Denies nausea, heartburn or change in bowel habits Skin: Denies abnormal skin rashes Lymphatics: Denies new lymphadenopathy or easy bruising Neurological:Denies numbness, tingling or new weaknesses Behavioral/Psych: Mood is stable, no new changes  Extremities: No lower extremity edema   All other systems were reviewed with the patient and are negative.  I have reviewed the past medical history, past surgical history, social history and family history with the patient and they are unchanged from previous note.  ALLERGIES:  is allergic to shellfish allergy and sulfa antibiotics.  MEDICATIONS:  Current Outpatient Medications  Medication Sig Dispense Refill  . azithromycin (ZITHROMAX Z-PAK) 250 MG tablet Take 550m po 1st day, then 2559mfor 4 days once a day. 6 each 0  . Biotin 10000 MCG TABS Take 10,000 mcg by mouth daily.     . Cholecalciferol (VITAMIN D PO)  Take 5,000 Units by mouth daily.     Elizabeth [name] 100 MG capsule TAKE 1 CAPSULE BY MOUTH  DAILY WITH BREAKFAST. TAKE  WHOLE WITH FOOD. TAKE FOR  21 DAYS ON, 7 DAYS OFF,  REPEAT EVERY 28 DAYS. 21 capsule 2  . letrozole (FEMARA) 2.5 MG tablet Take 1 tablet (2.5 mg total) by mouth daily. 90 tablet 3  . levothyroxine (SYNTHROID, LEVOTHROID) 25 MCG tablet Take 25 mcg by  mouth daily before breakfast.     . Multiple Vitamin (MULTIVITAMIN) tablet Take 1 tablet by mouth daily.    . Omega-3 Fatty Acids (SUPER OMEGA 3 EPA/DHA PO) Take 1 capsule by mouth daily.    . ondansetron (ZOFRAN) 8 MG tablet Take 1 tablet (8 mg total) by mouth every 8 (eight) hours as needed for nausea or vomiting. 30 tablet 1  . oxyCODONE-acetaminophen (PERCOCET/ROXICET) 5-325 MG tablet Take 1 tablet by mouth every 8 (eight) hours as needed for severe pain. 60 tablet 0  . prednisoLONE acetate (PRED FORTE) 1 % ophthalmic suspension Place 1 drop into both eyes daily.    . traMADol (ULTRAM) 50 MG tablet Take 1 tablet (50 mg total) by mouth every 6 (six) hours as needed. 30 tablet 0   No current facility-administered medications for this visit.     PHYSICAL EXAMINATION: ECOG PERFORMANCE STATUS: 1 - Symptomatic but completely ambulatory  Vitals:   05/25/18 0938  BP: 138/81  Pulse: 96  Resp: 17  Temp: 98.5 F (36.9 C)  SpO2: 98%   Filed Weights   05/25/18 0938  Weight: 124 lb 9.6 oz (56.5 kg)    GENERAL:alert, no distress and comfortable SKIN: skin color, texture, turgor are normal, no rashes or significant lesions EYES: normal, Conjunctiva are pink and non-injected, sclera clear OROPHARYNX:no exudate, no erythema and lips, buccal mucosa, and tongue normal  NECK: supple, thyroid normal size, non-tender, without nodularity LYMPH:  no palpable lymphadenopathy in the cervical, axillary or inguinal LUNGS: Decreased breath sounds at the left lung base with dullness to percussion HEART: regular rate & rhythm and no murmurs and no lower extremity edema ABDOMEN:abdomen soft, non-tender and normal bowel sounds MUSCULOSKELETAL:no cyanosis of digits and no clubbing  NEURO: alert & oriented x 3 with fluent speech, no focal motor/sensory deficits EXTREMITIES: No lower extremity edema   LABORATORY DATA:  I have reviewed the data as listed CMP Latest Ref Rng & Units 04/14/2018 03/18/2018  01/28/2018  Glucose 70 - 99 mg/dL 91 90 99  BUN 8 - 23 mg/dL _0 Creatinine 0.44 - 1.00 mg/dL 0.86 0.82 1.56(H)  Sodium 135 - 145 mmol/L 141 142 141  Potassium 3.5 - 5.1 mmol/L 4.3 4.1 4.5  Chloride 98 - 111 mmol/L 107 107 105  CO2 22 - 32 mmol/L _1 Calcium 8.9 - 10.3 mg/dL 9.2 9.2 9.6  Total Protein 6.5 - 8.1 g/dL 7.1 7.3 7.0  Total Bilirubin 0.3 - 1.2 mg/dL 0.5 0.3 0.3  Alkaline Phos 38 - 126 U/L 77 101 103  AST 15 - 41 U/L _2 ALT 0 - 44 U/L _3 Lab Results  Component Value Date   WBC 2.5 (L) 05/25/2018   HGB 12.2 05/25/2018   HCT 36.1 05/25/2018   MCV 101.1 (H) 05/25/2018   PLT 483 (H) 05/25/2018   NEUTROABS PENDING 05/25/2018    ASSESSMENT & PLAN:  Breast cancer of lower-outer quadrant of left female breast Left breast cancer invasive ductal  carcinoma 2.5 cm in size grade 1, ER 99%, PR 41%, Ki-67 11%, HER-2 negative T2 N0 M0 stage II a status post neoadjuvant chemotherapy followed by surgery February 2011and wason antiestrogen therapy with Femara from January 2011-July 2017  New onset mid back pain: MRI revealedT11 severe pathologic fracture, abnormal signal T10, T11 and T12, moderate canal stenosis at T11  12/02/2017: PET CT scan hypermetabolic lymphadenopathy in the thoracic and upper abdominal lymph nodes, spine and rib metastases, intramuscular metastases the left pectoralis muscle, T11 vertebral fracture pathologic  Biopsy of the T11 vertebral body ER 90%, PR 0%, Ki-67 10%, HER-2 -1+, foundation 1, PDL 1pending  Pathologic fracture T11: She met with orthopedics regarding kyphoplasty and they determined that there is no surgical options available for her. ------------------------------------------------------------------- Treatment : 1. Palliative radiation to T11 vertebral body and the pectoralis muscle: 12/20/17-12/26/17 2. current treatment: Ibrance with letrozole along with Delton See or Zometa Patient could not tolerate Faslodex  because of intense bone pain as well as hypotension.  Ibrance toxicities: 1.VEX:OGACGBK from today continue with current dosage of 100 mg Ibrance  Zometa every 3 months.   04/09/2018 bone scan: Foci of increased tracer uptake in left ribs and lower thoracic spine consistent with bone metastases. 04/09/2018: CT CAP: Reduced size of the several lymph nodes in the chest, reduced size of the abdominal lymph nodes.  No new bone lesions, large left pleural effusion, left suprahilar airspace opacity 3.5 x 3 cm  Severe shortness of breath with dullness in the left lung base: Previous CT she had a large left pleural effusion.   Chest x-ray 05/25/2018: Continued moderate to severe left pleural effusion with radiation scar tissue in the left upper lung  Patient will require tramadol for 3 to 4 days after Zometa infusion.  She will also need a Magic mouthwash because she gets mouth sores from Zometa.  I sent her prescriptions for both of these medications today.  Return to clinic in 4 months with scans labs and Zometa and follow-up.    Orders Placed This Encounter  Procedures  . DG Chest 2 View    Standing Status:   Future    Standing Expiration Date:   06/29/2019    Order Specific Question:   Reason for exam:    Answer:   Large left effusion with SOB    Order Specific Question:   Preferred imaging location?    Answer:   [facility]   The patient has a good understanding of the overall plan. she agrees with it. she will call with any problems that may develop before the next visit here.   Harriette Ohara, MD 05/25/18

## 2018-05-25 NOTE — Patient Instructions (Signed)

## 2018-05-25 NOTE — Assessment & Plan Note (Signed)
Left breast cancer invasive ductal carcinoma 2.5 cm in size grade 1, ER 99%, PR 41%, Ki-67 11%, HER-2 negative T2 N0 M0 stage II a status post neoadjuvant chemotherapy followed by surgery February 2011and wason antiestrogen therapy with Femara from January 2011-July 2017  New onset mid back pain: MRI revealedT11 severe pathologic fracture, abnormal signal T10, T11 and T12, moderate canal stenosis at T11  12/02/2017: PET CT scan hypermetabolic lymphadenopathy in the thoracic and upper abdominal lymph nodes, spine and rib metastases, intramuscular metastases the left pectoralis muscle, T11 vertebral fracture pathologic  Biopsy of the T11 vertebral body ER 90%, PR 0%, Ki-67 10%, HER-2 -1+, foundation 1, PDL 1pending  Pathologic fracture T11: She met with orthopedics regarding kyphoplasty and they determined that there is no surgical options available for her. ------------------------------------------------------------------- Treatment : 1. Palliative radiation to T11 vertebral body and the pectoralis muscle: 12/20/17-12/26/17 2. current treatment: Ibrance with letrozole along with Delton See or Zometa Patient could not tolerate Faslodex because of intense bone pain as well as hypotension.  Ibrance toxicities: 1.ANC:1.4Continue with current dosage of 100 mg Ibrance  Zometa every 3 months.   04/09/2018 bone scan: Foci of increased tracer uptake in left ribs and lower thoracic spine consistent with bone metastases. 04/09/2018: CT CAP: Reduced size of the several lymph nodes in the chest, reduced size of the abdominal lymph nodes.  No new bone lesions, large left pleural effusion, left suprahilar airspace opacity 3.5 x 3 cm  Based on the scans, I recommended continuation of current treatment.   Return to clinic for labs and follow-up to coincide with Zometa. Patient will require narcotic pain medication for 3 to 4 days after Zometa infusion.  She will also need a Magic mouthwash  because she gets mouth sores from Zometa.  Our plan is to do scans every 6 months going forward.

## 2018-05-25 NOTE — Telephone Encounter (Signed)
Gave avs and calendar ° °

## 2018-06-16 ENCOUNTER — Telehealth: Payer: Self-pay

## 2018-06-16 ENCOUNTER — Other Ambulatory Visit: Payer: Self-pay

## 2018-06-16 DIAGNOSIS — R059 Cough, unspecified: Secondary | ICD-10-CM

## 2018-06-16 DIAGNOSIS — Z170 Estrogen receptor positive status [ER+]: Principal | ICD-10-CM

## 2018-06-16 DIAGNOSIS — C50512 Malignant neoplasm of lower-outer quadrant of left female breast: Secondary | ICD-10-CM

## 2018-06-16 DIAGNOSIS — R0602 Shortness of breath: Secondary | ICD-10-CM

## 2018-06-16 DIAGNOSIS — C7951 Secondary malignant neoplasm of bone: Secondary | ICD-10-CM

## 2018-06-16 DIAGNOSIS — R05 Cough: Secondary | ICD-10-CM

## 2018-06-16 NOTE — Telephone Encounter (Signed)
Patient called to confirm if she need labs prior to imaging in June.  Nurse confirmed need for labs, appointment scheduled.   Pt with labored breathing over phone.  Pt reports she has been having shortness of breath, cough, and feeling of lungs being "full" even at rest.  Patient denies any fever.    Has been taking OTC Mucinex. Pt reports having difficulty sleeping at night from cough.  Per pt this has been on going and actually feels better today than the last few days.    Nurse reviewed with provider.  CXR recommended and follow up with symptom management for evaluation.  Pt aware - scheduling message sent, orders placed for CXR.

## 2018-06-17 ENCOUNTER — Ambulatory Visit (HOSPITAL_COMMUNITY)
Admission: RE | Admit: 2018-06-17 | Discharge: 2018-06-17 | Disposition: A | Discharge status: Home / Self Care | Payer: 59 | Source: Ambulatory Visit | Attending: Medical | Admitting: Medical

## 2018-06-17 ENCOUNTER — Ambulatory Visit (HOSPITAL_COMMUNITY)
Admission: RE | Admit: 2018-06-17 | Discharge: 2018-06-17 | Disposition: A | Discharge status: Home / Self Care | Payer: 59 | Source: Ambulatory Visit | Attending: Radiology | Admitting: Radiology

## 2018-06-17 ENCOUNTER — Ambulatory Visit (HOSPITAL_COMMUNITY)
Admission: RE | Admit: 2018-06-17 | Discharge: 2018-06-17 | Disposition: A | Discharge status: Home / Self Care | Payer: 59 | Source: Ambulatory Visit | Attending: Hematology and Oncology | Admitting: Hematology and Oncology

## 2018-06-17 ENCOUNTER — Inpatient Hospital Stay: Payer: 59 | Attending: Hematology and Oncology | Admitting: Medical

## 2018-06-17 VITALS — BP 134/87 | HR 93 | Temp 98.1°F | Resp 16 | Ht 64.0 in | Wt 125.1 lb

## 2018-06-17 DIAGNOSIS — Z9889 Other specified postprocedural states: Secondary | ICD-10-CM | POA: Insufficient documentation

## 2018-06-17 DIAGNOSIS — Z853 Personal history of malignant neoplasm of breast: Secondary | ICD-10-CM | POA: Insufficient documentation

## 2018-06-17 DIAGNOSIS — J90 Pleural effusion, not elsewhere classified: Secondary | ICD-10-CM

## 2018-06-17 DIAGNOSIS — C50212 Malignant neoplasm of upper-inner quadrant of left female breast: Secondary | ICD-10-CM | POA: Insufficient documentation

## 2018-06-17 DIAGNOSIS — R059 Cough, unspecified: Secondary | ICD-10-CM

## 2018-06-17 DIAGNOSIS — R0602 Shortness of breath: Secondary | ICD-10-CM | POA: Diagnosis present

## 2018-06-17 DIAGNOSIS — C7951 Secondary malignant neoplasm of bone: Secondary | ICD-10-CM | POA: Insufficient documentation

## 2018-06-17 DIAGNOSIS — R05 Cough: Secondary | ICD-10-CM

## 2018-06-17 DIAGNOSIS — Z170 Estrogen receptor positive status [ER+]: Secondary | ICD-10-CM | POA: Insufficient documentation

## 2018-06-17 DIAGNOSIS — C50512 Malignant neoplasm of lower-outer quadrant of left female breast: Secondary | ICD-10-CM | POA: Insufficient documentation

## 2018-06-17 DIAGNOSIS — R846 Abnormal cytological findings in specimens from respiratory organs and thorax: Secondary | ICD-10-CM | POA: Diagnosis not present

## 2018-06-17 DIAGNOSIS — R0600 Dyspnea, unspecified: Secondary | ICD-10-CM | POA: Insufficient documentation

## 2018-06-17 DIAGNOSIS — J069 Acute upper respiratory infection, unspecified: Secondary | ICD-10-CM

## 2018-06-17 MED ORDER — AMOXICILLIN-POT CLAVULANATE 875-125 MG PO TABS: 1.0000 | ORAL_TABLET | Freq: Two times a day (BID) | ORAL | 0 refills | Status: DC

## 2018-06-17 MED ORDER — LIDOCAINE HCL 1 % IJ SOLN
INTRAMUSCULAR | Status: AC
  Filled 2018-06-17: qty 20

## 2018-06-17 MED ORDER — HYDROCODONE-HOMATROPINE 5-1.5 MG/5ML PO SYRP: 5.0000 mL | ORAL_SOLUTION | Freq: Four times a day (QID) | ORAL | 0 refills | Status: DC | PRN

## 2018-06-17 MED ORDER — BENZONATATE 100 MG PO CAPS: 100.0000 mg | ORAL_CAPSULE | Freq: Three times a day (TID) | ORAL | 0 refills | Status: DC | PRN

## 2018-06-17 NOTE — Patient Instructions (Signed)
Thoracentesis    A thoracentesis is a procedure to remove excess fluid that has built up in the space between the linings of the chest wall and the lungs (pleural space). It is normal to have a small amount of fluid in the pleural space. Some medical conditions, such as heart failure, pneumonia, kidney problems, or cancer, can create too much fluid. This extra fluid is removed using a needle that is inserted through the skin and tissue and into the pleural space.  A thoracentesis may be done to:   Understand why there is extra fluid in the pleural space and to create a treatment plan that is right for you.   Help get rid of shortness of breath, discomfort, or pain that is caused by the extra fluid.  Tell a health care provider about:   Any allergies you have.   All medicines you are taking, including vitamins, herbs, eye drops, creams, and over-the-counter medicines. This includes any use of steroids, either by mouth or as a cream.   Any problems you or family members have had with anesthetic medicines.   Any blood disorders you have, including any history of blood clots.   Any surgeries you have had.   Medical conditions you have, including a frequent cough or coughing episodes.   Whether you are pregnant or may be pregnant.  What are the risks?  Generally, this is a safe procedure. However, problems may occur, including:   Infection.   Bleeding.   Injury to the lung.   Injury to surrounding tissues or organs.   Collapse of the lung.  What happens before the procedure?   Follow instructions from your health care provider about hydration, which may include:  ? Up to 2 hours before the procedure - you may continue to drink clear liquids, such as water, clear fruit juice, black coffee, and plain tea.  Eating and drinking restrictions  Follow instructions from your health care provider about eating and drinking, which may include:   8 hours before the procedure - stop eating heavy meals or foods such as  meat, fried foods, or fatty foods.   6 hours before the procedure - stop eating light meals or foods, such as toast or cereal.   6 hours before the procedure - stop drinking milk or drinks that contain milk.   2 hours before the procedure - stop drinking clear liquids.  Medicines  Ask your health care provider about:   Changing or stopping your regular medicines. This is especially important if you are taking diabetes medicines or blood thinners.   Taking medicines such as aspirin and ibuprofen. These medicines can thin your blood. Do not take these medicines unless your health care provider tells you to take them.   Taking over-the-counter medicines, vitamins, herbs, and supplements.   Taking a cough suppressant if you have a frequent cough or coughing episodes.  General instructions   You may have a chest X-ray or another imaging test, such as a CT scan or ultrasound, to determine the location and amount of fluid in your pleural space.   Plan to have someone take you home from the hospital or clinic.   Plan to have a responsible adult care for you for at least 24 hours after you leave the hospital or clinic. This is important.   Ask your health care provider what steps will be taken to help prevent infection. These may include:  ? Removing hair at the needle-insertion site.  ? Washing   skin with a germ-killing soap.  What happens during the procedure?   You will be asked to sit upright and lean slightly forward for the procedure.   An IV will be inserted into one of your veins.   You will be given one or both of the following:  ? A medicine to help you relax (sedative).  ? A medicine to numb the area (local anesthetic).   The health care provider will insert a needle into your back so that it goes between the ribs and into the pleural space. You may feel pressure or slight pain as the needle is positioned into the pleural space.   Fluid will be removed from the pleural space through the needle. You  may feel pressure as the fluid is removed.   The health care provider will take the needle out after the excess fluid has been removed. A sample of the fluid may be sent to the lab for testing.   The needle insertion site (puncture site) will be covered with a bandage (dressing).  The procedure may vary among health care providers and hospitals.  What happens after the procedure?   Your blood pressure, heart rate, breathing rate, and blood oxygen level will be monitored until you leave the hospital or clinic.   A chest X-ray may be done to check the amount of fluid that remains in your pleural space.   If a sample of fluid was sent for testing, ask your health care provider, or the department that did the procedure, when your results will be ready. It is up to you to get your test results.   Do not drive for 24 hours if you were given a sedative during your procedure.  Summary   A thoracentesis is a procedure to remove excess fluid that has built up in the space between the linings of the chest wall and the lungs (pleural space).   Some medical conditions, such as heart failure, pneumonia, kidney problems, or cancer, can create too much fluid.   For the procedure, a needle will be inserted between your ribs and into the pleural space. Fluid will be removed from the pleural space through the needle. The fluid may be sent to a lab for testing.   After the procedure, you may have a chest X-ray to check the amount of fluid still in your pleural space.  This information is not intended to replace advice given to you by your health care provider. Make sure you discuss any questions you have with your health care provider.  Document Released: 10/15/2004 Document Revised: 02/24/2017 Document Reviewed: 02/24/2017  Elsevier Interactive Patient Education  2019 [employer]

## 2018-06-17 NOTE — Procedures (Signed)
Ultrasound-guided diagnostic and therapeutic left thoracentesis performed yielding 1.1 liters of yellow fluid. No immediate complications. Follow-up chest x-ray pending. EBL < 1 cc. Due to persistent pt coughing only the above amount of fluid was removed today.

## 2018-06-18 NOTE — Progress Notes (Signed)
Symptoms Management Clinic Progress Note   MAUDEAN HOFFMANN [id number] 11-01-1955 63 y.o.  Asherah Lavoy Farrugia is managed by Dr. Nicholas Lose  Actively treated with chemotherapy/immunotherapy/hormonal therapy: yes  Current therapy: Ibrance, letrozole and Zometa  Next scheduled appointment with provider: 09/28/2018  Assessment: Plan:    Malignant neoplasm of lower-outer quadrant of left breast of female, estrogen receptor positive ([place]) - Plan: US Thoracentesis Asp Pleural space w/IMG guide  Bone metastasis ([place]) - Plan: US Thoracentesis Asp Pleural space w/IMG guide  Pleural effusion on left - Plan: US Thoracentesis Asp Pleural space w/IMG guide  Upper respiratory tract infection, unspecified type - Plan: amoxicillin-clavulanate (AUGMENTIN) 875-125 MG tablet, HYDROcodone-homatropine (HYCODAN) 5-1.5 MG/5ML syrup, benzonatate (TESSALON) 100 MG capsule  Cough - Plan: HYDROcodone-homatropine (HYCODAN) 5-1.5 MG/5ML syrup, benzonatate (TESSALON) 100 MG capsule   ER positive metastatic malignant neoplasm of the left breast with bone metastasis: The patient continues to be managed by Dr. Nicholas Lose and is treated with [name], letrozole, and Zometa.  She is scheduled to see Dr. Lindi Adie in follow-up on 09/28/2018.  Enlarging left pleural effusion: The patient was referred for a left thoracentesis today with the specimen to be sent for cytology.  Upper respiratory tract infection with cough and hoarseness: Patient was given a prescription for Augmentin 875-125 p.o. twice daily x7 days, Hycodan syrup, and Tessalon Perles.  Please see After Visit Summary for patient specific instructions.  Future Appointments  Date Time Provider [place]  09/24/2018  9:15 AM [facility] LAB ONLY [facility] None  09/24/2018 10:00 AM [facility] INJ 1 [facility] [place]  09/24/2018 10:30 AM [facility]CT 1 [facility] [place]  09/24/2018  1:00 PM [facility] 1 [facility] [place]  09/28/2018  8:30 AM Nicholas Lose, MD [facility] None   09/28/2018  9:00 AM [facility] INFUSION [facility] None    Orders Placed This Encounter  Procedures  . US Thoracentesis Asp Pleural space w/IMG guide       Subjective:   Patient ID:  CYRIAH CHILDREY is a 63 y.o. (DOB 15-Jul-1955) female.  Chief Complaint:  Chief Complaint  Patient presents with  . Shortness of Breath    HPI CHANETTA MOOSMAN is a 63 year old female with a history of an ER positive metastatic malignant neoplasm of the left breast with bone metastasis.  She is followed by Dr. Nicholas Lose and is treated with [name], letrozole, and Zometa.  She was last seen on 05/25/2018 at which time a chest x-ray was completed which showed:  1. There is a moderate to large left pleural effusion, new compared to most recent prior chest radiograph state 05/16/2017 and approximately the same size as a pleural effusion seen on prior CT dated 04/09/2018. There is a dense, masslike consolidation and volume loss of the medial left upper lobe, as seen on prior CT. The right lung is normally aerated.  2. There is a high-grade sclerotic fracture deformity of the T11 body, in keeping with osseous metastatic disease and as seen on prior CT.  The patient presents to clinic today with a report of an ongoing nonproductive cough which she has had for several months.  She has ongoing nasal and chest congestion for which she is tried Mucinex without relief.  She is having increasing shortness of breath and dyspnea on exertion recently.  She has fatigue secondary to not sleeping well because of her cough.  She is having aching in her ribs because of her cough.  She acknowledges mild diarrhea.  She denies fevers, chills,  nausea or vomiting.  She was referred for chest x-ray today which returned showing:  1. Increase in volume of loculated left pleural effusion.  The patient has been reluctant to have a thoracentesis but is agreeable to proceed today.  Medications: I have reviewed the patient's current  medications.  Allergies:  Allergies  Allergen Reactions  . Shellfish Allergy Anaphylaxis and Other (See Comments)    Allergist said she was "highly allergic to shellfish"  . Sulfa Antibiotics Rash and Other (See Comments)    Looks like severe sunburn    Past Medical History:  Diagnosis Date  . Arthritis   . Breast cancer ([place]) 2010   Left  . Chronic sinusitis   . Family history of adverse reaction to anesthesia    sister-nausea/vomiting  . History of chemotherapy 01/2009-03/2009  . Hypercholesteremia   . Hypothyroid    "inactive thyroid"  . Migraines   . RSD (reflex sympathetic dystrophy)     Past Surgical History:  Procedure Laterality Date  . BREAST RECONSTRUCTION    . EYE SURGERY Bilateral 7/18, 10/18  . KNEE ARTHROSCOPY Bilateral   . KNEE SURGERY Right    Two surgeries  . MASTECTOMY Bilateral 05/22/2009  . MASTECTOMY Bilateral 2011   restrictions on left arm-no labs/bloodpressure  . NASAL SINUS SURGERY    . PORTA CATH INSERTION  12/2008  . PORTA CATH REMOVAL  05/2009  . ROOT CANAL    . SHOULDER SURGERY Right   . TOTAL HIP ARTHROPLASTY Left 05/26/2017   Procedure: TOTAL HIP ARTHROPLASTY ANTERIOR APPROACH;  Surgeon: Frederik Pear, MD;  Location: [facility];  Service: Orthopedics;  Laterality: Left;    Family History  Problem Relation Age of Onset  . Hypertension Mother     Social History   Socioeconomic History  . Marital status: Married    Spouse name: Not on file  . Number of children: Not on file  . Years of education: Not on file  . Highest education level: Not on file  Occupational History  . Not on file  Social Needs  . Financial resource strain: Not on file  . Food insecurity:    Worry: Not on file    Inability: Not on file  . Transportation needs:    Medical: Not on file    Non-medical: Not on file  Tobacco Use  . Smoking status: Never Smoker  . Smokeless tobacco: Never Used  Substance and Sexual Activity  . Alcohol use: No  . Drug use: No    . Sexual activity: Yes    Birth control/protection: Post-menopausal  Lifestyle  . Physical activity:    Days per week: Not on file    Minutes per session: Not on file  . Stress: Not on file  Relationships  . Social connections:    Talks on phone: Not on file    Gets together: Not on file    Attends religious service: Not on file    Active member of club or organization: Not on file    Attends meetings of clubs or organizations: Not on file    Relationship status: Not on file  . Intimate partner violence:    Fear of current or ex partner: Not on file    Emotionally abused: Not on file    Physically abused: Not on file    Forced sexual activity: Not on file  Other Topics Concern  . Not on file  Social History Narrative  . Not on file    Past Medical  History, Surgical history, Social history, and Family history were reviewed and updated as appropriate.   Please see review of systems for further details on the patient's review from today.   Review of Systems:  Review of Systems  Constitutional: Negative for chills, diaphoresis and fever.  HENT: Negative for trouble swallowing.   Respiratory: Positive for cough and shortness of breath. Negative for choking, chest tightness, wheezing and stridor.   Cardiovascular: Positive for chest pain. Negative for palpitations.  Gastrointestinal: Positive for diarrhea.  Psychiatric/Behavioral: Positive for sleep disturbance.    Objective:   Physical Exam:  BP 134/87 (BP Location: Right Arm, Patient Position: Sitting)   Pulse 93   Temp 98.1 F (36.7 C) (Oral)   Resp 16   Ht 5\' 4"  (1.626 m)   Wt 125 lb 1.6 oz (56.7 kg)   SpO2 96%   BMI 21.47 kg/m  ECOG: 1  Physical Exam Constitutional:      General: She is not in acute distress.    Appearance: She is not diaphoretic.  HENT:     Head: Normocephalic and atraumatic.  Cardiovascular:     Rate and Rhythm: Normal rate and regular rhythm.     Heart sounds: Normal heart sounds. No  murmur. No friction rub. No gallop.   Pulmonary:     Effort: Pulmonary effort is normal. No respiratory distress.     Breath sounds: No stridor. Examination of the left-middle field reveals decreased breath sounds. Examination of the left-lower field reveals decreased breath sounds. Decreased breath sounds present. No wheezing or rales.  Chest:     Chest wall: There is dullness to percussion.     Comments: Decreased breath sounds throughout the lower two thirds of the left lung with dullness to percussion. Skin:    General: Skin is warm and dry.     Coloration: Skin is not pale.     Findings: No erythema.  Neurological:     Mental Status: She is alert.  Psychiatric:        Behavior: Behavior normal.        Thought Content: Thought content normal.        Judgment: Judgment normal.     Lab Review:     Component Value Date/Time   NA 142 05/25/2018 0919   NA 142 04/11/2014 0804   K 3.8 05/25/2018 0919   K 4.0 04/11/2014 0804   CL 107 05/25/2018 0919   CL 105 08/06/2012 1313   CO2 26 05/25/2018 0919   CO2 28 04/11/2014 0804   GLUCOSE 91 05/25/2018 0919   GLUCOSE 82 04/11/2014 0804   GLUCOSE 100 (H) 08/06/2012 1313   BUN 16 05/25/2018 0919   BUN 13.4 04/11/2014 0804   CREATININE 0.90 05/25/2018 0919   CREATININE 0.8 04/11/2014 0804   CALCIUM 9.2 05/25/2018 0919   CALCIUM 9.1 04/11/2014 0804   PROT 7.3 05/25/2018 0919   PROT 6.6 04/11/2014 0804   ALBUMIN 3.8 05/25/2018 0919   ALBUMIN 3.7 04/11/2014 0804   AST 22 05/25/2018 0919   AST 16 04/11/2014 0804   ALT 13 05/25/2018 0919   ALT 16 04/11/2014 0804   ALKPHOS 77 05/25/2018 0919   ALKPHOS 91 04/11/2014 0804   BILITOT 0.7 05/25/2018 0919   BILITOT 0.48 04/11/2014 0804   GFRNONAA >60 05/25/2018 0919   GFRAA >60 05/25/2018 0919       Component Value Date/Time   WBC 2.5 (L) 05/25/2018 0919   WBC 4.1 12/11/2017 0641   RBC 3.57 (L)  05/25/2018 0919   HGB 12.2 05/25/2018 0919   HGB 13.0 04/11/2014 0804   HCT 36.1  05/25/2018 0919   HCT 39.5 04/11/2014 0804   PLT 483 (H) 05/25/2018 0919   PLT 359 04/11/2014 0804   MCV 101.1 (H) 05/25/2018 0919   MCV 93.4 04/11/2014 0804   MCH 34.2 (H) 05/25/2018 0919   MCHC 33.8 05/25/2018 0919   RDW 15.0 05/25/2018 0919   RDW 13.1 04/11/2014 0804   LYMPHSABS 0.5 (L) 05/25/2018 0919   LYMPHSABS 1.8 04/11/2014 0804   MONOABS 0.2 05/25/2018 0919   MONOABS 0.5 04/11/2014 0804   EOSABS 0.1 05/25/2018 0919   EOSABS 0.2 04/11/2014 0804   BASOSABS 0.1 05/25/2018 0919   BASOSABS 0.0 04/11/2014 0804   -------------------------------  Imaging from last 24 hours (if applicable):  Radiology interpretation: Dg Chest 1 View  Result Date: 06/17/2018 CLINICAL DATA:  Status post left thoracentesis EXAM: CHEST  1 VIEW COMPARISON:  Film from earlier in the same day. FINDINGS: Cardiac shadow is mildly prominent. Fullness in the left hilar region is noted. Although this may be vascular in etiology the possibility of lymphadenopathy deserves consideration given the pleural effusion. Left upper lobe infiltrative opacity is noted and slightly improved when compared with the prior exam. No pneumothorax is noted following thoracentesis. The right lung is clear. Sclerotic T11 vertebral body is noted with compression deformity. IMPRESSION: No pneumothorax following left thoracentesis. Significant reduction in pleural effusion is noted. Fullness in left hilar region is noted which may be vascular in nature although the possibility of increased lymphadenopathy deserves consideration. CT of the chest may be helpful for further evaluation as clinically indicated. Electronically Signed   By: Inez Catalina M.D.   On: 06/17/2018 14:46   Dg Chest 2 View  Result Date: 06/17/2018 CLINICAL DATA:  Cough, wheezing and shortness of breath. EXAM: CHEST - 2 VIEW COMPARISON:  05/25/2018 FINDINGS: The cardiac silhouette is obscured day a large loculated left pleural effusion. When compared with the previous exam  there is been increase in volume of the left pleural fluid collection. Right lung remains clear. Sclerotic compression deformity involving the T11 vertebra is again noted. IMPRESSION: 1. Increase in volume of loculated left pleural effusion. Electronically Signed   By: Kerby Moors M.D.   On: 06/17/2018 14:07   Dg Chest 2 View  Result Date: 05/25/2018 CLINICAL DATA:  Breast cancer, shortness of breath, cough EXAM: CHEST - 2 VIEW COMPARISON:  Chest radiograph, 05/16/2017 FINDINGS: The left cardiac border is obscured. There is a moderate to large left pleural effusion, new compared to most recent prior chest radiograph state 05/16/2017 and approximately the same size as a pleural effusion seen on prior CT dated 04/09/2018. There is a dense, masslike consolidation and volume loss of the medial left upper lobe, as seen on prior CT. The right lung is normally aerated. There is a high-grade sclerotic fracture deformity of the T11 body, in keeping with osseous metastatic disease and as seen on prior CT. IMPRESSION: 1. There is a moderate to large left pleural effusion, new compared to most recent prior chest radiograph state 05/16/2017 and approximately the same size as a pleural effusion seen on prior CT dated 04/09/2018. There is a dense, masslike consolidation and volume loss of the medial left upper lobe, as seen on prior CT. The right lung is normally aerated. 2. There is a high-grade sclerotic fracture deformity of the T11 body, in keeping with osseous metastatic disease and as seen on prior CT.  Electronically Signed   By: Eddie Candle M.D.   On: 05/25/2018 10:15   US Thoracentesis Asp Pleural Space W/img Guide  Result Date: 06/17/2018 INDICATION: Patient with history of left breast cancer, dyspnea, left pleural effusion, cough. Request made for diagnostic and therapeutic left thoracentesis. EXAM: ULTRASOUND GUIDED DIAGNOSTIC AND THERAPEUTIC LEFT THORACENTESIS MEDICATIONS: None COMPLICATIONS: None immediate.  PROCEDURE: An ultrasound guided thoracentesis was thoroughly discussed with the patient and questions answered. The benefits, risks, alternatives and complications were also discussed. The patient understands and wishes to proceed with the procedure. Written consent was obtained. Ultrasound was performed to localize and mark an adequate pocket of fluid in the left chest. The area was then prepped and draped in the normal sterile fashion. 1% Lidocaine was used for local anesthesia. Under ultrasound guidance a 6 Fr Safe-T-Centesis catheter was introduced. Thoracentesis was performed. The catheter was removed and a dressing applied. FINDINGS: A total of approximately 1.1 liters of yellow fluid was removed. Samples were sent to the laboratory as requested by the clinical team. Due to persistent patient coughing only the above amount of fluid was removed today. IMPRESSION: Successful ultrasound guided diagnostic and therapeutic left thoracentesis yielding 1.1 liters of pleural fluid. Read by: Rowe Robert, PA-C Electronically Signed   By: Jerilynn Mages.  Shick M.D.   On: 06/17/2018 14:23

## 2018-06-26 ENCOUNTER — Telehealth: Payer: Self-pay | Admitting: *Deleted

## 2018-06-26 NOTE — Telephone Encounter (Signed)
Reached pt regarding her cytology results.  Informed pt that the cytology showed no malignancy.  Pt happy with the news and verbalized understanding.

## 2018-06-26 NOTE — Telephone Encounter (Signed)
Attempted to call pt regarding cytology results.  Pt voice mail not set up, unable to leave message

## 2018-07-15 ENCOUNTER — Ambulatory Visit (HOSPITAL_COMMUNITY)
Admission: RE | Admit: 2018-07-15 | Discharge: 2018-07-15 | Disposition: A | Discharge status: Home / Self Care | Payer: 59 | Source: Ambulatory Visit | Attending: Physician Assistant | Admitting: Physician Assistant

## 2018-07-15 ENCOUNTER — Other Ambulatory Visit: Payer: Self-pay

## 2018-07-15 ENCOUNTER — Ambulatory Visit (HOSPITAL_COMMUNITY)
Admission: RE | Admit: 2018-07-15 | Discharge: 2018-07-15 | Disposition: A | Discharge status: Home / Self Care | Payer: 59 | Source: Ambulatory Visit | Attending: Hematology and Oncology | Admitting: Hematology and Oncology

## 2018-07-15 DIAGNOSIS — C50512 Malignant neoplasm of lower-outer quadrant of left female breast: Secondary | ICD-10-CM

## 2018-07-15 DIAGNOSIS — R0602 Shortness of breath: Secondary | ICD-10-CM | POA: Diagnosis not present

## 2018-07-15 DIAGNOSIS — J90 Pleural effusion, not elsewhere classified: Secondary | ICD-10-CM

## 2018-07-15 DIAGNOSIS — J9811 Atelectasis: Secondary | ICD-10-CM

## 2018-07-15 DIAGNOSIS — Z170 Estrogen receptor positive status [ER+]: Secondary | ICD-10-CM | POA: Insufficient documentation

## 2018-07-15 MED ORDER — LIDOCAINE HCL 1 % IJ SOLN
INTRAMUSCULAR | Status: AC
  Filled 2018-07-15: qty 20

## 2018-07-15 NOTE — Procedures (Signed)
PROCEDURE SUMMARY:  Successful image-guided left thoracentesis. Yielded 1.5 liters of clear yellow fluid - procedure was aborted due to patient with persistent coughing and chest pain. Residual fluid remains on post procedure ultrasound.  Patient tolerated procedure well. EBL: Zero No immediate complications.  Specimen was not sent for labs. Post procedure CXR shows no pneumothorax.  Please see imaging section of Epic for full dictation.  Joaquim Nam PA-C 07/15/2018 3:11 PM

## 2018-07-15 NOTE — Progress Notes (Signed)
Per MD recommendations with results of CXR - Thoracentesis is recommended.  Nurse notified pt, voiced agreement and understanding.  Set up for today.   Nurse called patient to follow up.  Patient reports mild discomfort after procedure.  Pt stated, "they were able to drain off 2 liters and I do feel it's a little easier to breathe."  Nurse provided rest, and anti inflammatories for pain.  Pt voiced understanding.  Pt will call to update on status tomorrow, 07/16/2018.

## 2018-07-15 NOTE — Progress Notes (Signed)
Pt called to report increase in shortness of breath.  Per patient no chest tightness, or heaviness.  Patient with history of mets to thoracic area.  Denies any fever, patient has not traveled.    Nurse reviewed with Dr. Lindi Adie - Dr. Lindi Adie recommends CXR.  Pt notified, orders placed.  Instructions for radiology to call report based on CXR.

## 2018-08-03 ENCOUNTER — Other Ambulatory Visit: Payer: Self-pay | Admitting: *Deleted

## 2018-08-03 ENCOUNTER — Inpatient Hospital Stay: Payer: 59 | Attending: Hematology and Oncology | Admitting: Hematology and Oncology

## 2018-08-03 ENCOUNTER — Telehealth: Payer: Self-pay | Admitting: *Deleted

## 2018-08-03 DIAGNOSIS — C50512 Malignant neoplasm of lower-outer quadrant of left female breast: Secondary | ICD-10-CM

## 2018-08-03 DIAGNOSIS — Z923 Personal history of irradiation: Secondary | ICD-10-CM

## 2018-08-03 DIAGNOSIS — Z170 Estrogen receptor positive status [ER+]: Secondary | ICD-10-CM | POA: Diagnosis not present

## 2018-08-03 DIAGNOSIS — Z79818 Long term (current) use of other agents affecting estrogen receptors and estrogen levels: Secondary | ICD-10-CM

## 2018-08-03 DIAGNOSIS — Z9221 Personal history of antineoplastic chemotherapy: Secondary | ICD-10-CM

## 2018-08-03 DIAGNOSIS — J90 Pleural effusion, not elsewhere classified: Secondary | ICD-10-CM

## 2018-08-03 DIAGNOSIS — Z79899 Other long term (current) drug therapy: Secondary | ICD-10-CM

## 2018-08-03 DIAGNOSIS — C7951 Secondary malignant neoplasm of bone: Secondary | ICD-10-CM | POA: Diagnosis not present

## 2018-08-03 MED ORDER — OXYCODONE-ACETAMINOPHEN 10-325 MG PO TABS: 1.0000 | ORAL_TABLET | Freq: Three times a day (TID) | ORAL | 0 refills | Status: AC | PRN

## 2018-08-03 NOTE — Assessment & Plan Note (Signed)
Left breast cancer invasive ductal carcinoma 2.5 cm in size grade 1, ER 99%, PR 41%, Ki-67 11%, HER-2 negative T2 N0 M0 stage II a status post neoadjuvant chemotherapy followed by surgery February 2011and wason antiestrogen therapy with Femara from January 2011-July 2017  New onset mid back pain: MRI revealedT11 severe pathologic fracture, abnormal signal T10, T11 and T12, moderate canal stenosis at T11  12/02/2017: PET CT scan hypermetabolic lymphadenopathy in the thoracic and upper abdominal lymph nodes, spine and rib metastases, intramuscular metastases the left pectoralis muscle, T11 vertebral fracture pathologic  Biopsy of the T11 vertebral body ER 90%, PR 0%, Ki-67 10%, HER-2 -1+, foundation 1, PDL 1  Pathologic fracture T11: She met with orthopedics regarding kyphoplasty and they determined that there is no surgical options available for her. ------------------------------------------------------------------- Treatment : 1. Palliative radiation to T11 vertebral body and the pectoralis muscle: 12/20/17-12/26/17 2. current treatment: Ibrance with letrozole along with Delton See or Zometa Patient could not tolerate Faslodex because of intense bone pain as well as hypotension.  Ibrance toxicities: 1.KVT:XLEZVGJ from today continue with current dosage of 100 mg Ibrance Zometa every 3 months.  04/09/2018 bone scan: Foci of increased tracer uptake in left ribs and lower thoracic spine consistent with bone metastases. 04/09/2018: CT CAP: Reduced size of the several lymph nodes in the chest, reduced size of the abdominal lymph nodes. No new bone lesions, large left pleural effusion, left suprahilar airspace opacity 3.5 x 3 cm  Severe shortness of breath with recurrent pleural effusions: Patient underwent 2 thoracentesis so far.  I called and discussed the case with Dr. Kathlene Cote with interventional radiology.  We will request for Pleurx catheter placement.  We will also request home  health to assist her with the drainage of the pleural fluid.  We will reassess her after the CT scans to determine if [name] is working.

## 2018-08-03 NOTE — Progress Notes (Signed)
HEMATOLOGY-ONCOLOGY TELEPHONE VISIT PROGRESS NOTE  I connected with Elizabeth [name] on 08/03/18 at  1:00 PM EDT by telephone and verified that I am speaking with the correct person using two identifiers.  I discussed the limitations, risks, security and privacy concerns of performing an evaluation and management service by telephone and the availability of in person appointments.  I also discussed with the patient that there may be a patient responsible charge related to this service. The patient expressed understanding and agreed to proceed.   History of Present Illness: Worsening shortness of breath.  Recurrent cough, worsening pleural effusion    Breast cancer of lower-outer quadrant of left female breast (Elizabeth [name])   12/13/2008 Initial Diagnosis    Left breast biopsy: Invasive ductal carcinoma ER 90% percent, PR 41%, Ki-67 11%, HER-2 negative ratio 1, Oncotype DX score 23, ROR15%    01/27/2009 - 03/10/2009 Neo-Adjuvant Chemotherapy    Neoadjuvant FEC 4; participant in the "bed sheets" study.    05/12/2009 -  Anti-estrogen oral therapy    Femara 2.5 mg daily    05/22/2009 Surgery    Bilateral mastectomy stage IIB T2 N0 M0 IDC left breast grade 1, 2.5 cm, ER 99%, PR 41%, Ki-67 11%, HER-2 negative    12/10/2009 Surgery    Breast reconstruction surgery    11/14/2017 Relapse/Recurrence    Mid back pain: MRI revealed T11 severe pathologic fracture, abnormal signal T10, T11 and T12, moderate canal stenosis at T11    12/03/2017 PET scan    Hypermetabolic metastatic breast cancer involving thoracic and upper abdominal lymph nodes, spine and ribs. There may be hypermetabolic adenopathy in the left neck; Hypermetabolic lymph node versus intramuscular metastasis involving the medial left pectoralis musculature Pathologic T11 fracture      12/11/2017 Procedure    Biopsy of T11 vertebral body lytic lesion: Metastatic breast adenocarcinoma ER positive, PR negative    12/19/2017 - 12/26/2017 Radiation Therapy     Palliative XRT to T 11    01/12/2018 -  Anti-estrogen oral therapy    Patient could not tolerate Faslodex because of severe hypotension and severe pain in the legs after injections.  Ibrance with letrozole starting 01/12/2018     REVIEW OF SYSTEMS:   Constitutional: Denies fevers, chills or abnormal weight loss Eyes: Denies blurriness of vision Ears, nose, mouth, throat, and face: Denies mucositis or sore throat Respiratory: Cough and severe shortness of breath Cardiovascular: Denies palpitation, chest discomfort Gastrointestinal:  Denies nausea, heartburn or change in bowel habits Skin: Denies abnormal skin rashes Lymphatics: Denies new lymphadenopathy or easy bruising Neurological:Denies numbness, tingling or new weaknesses Behavioral/Psych: Mood is stable, no new changes  Extremities: No lower extremity edema  All other systems were reviewed with the patient and are negative.  Observations/Objective:     Assessment Plan:  Breast cancer of lower-outer quadrant of left female breast Left breast cancer invasive ductal carcinoma 2.5 cm in size grade 1, ER 99%, PR 41%, Ki-67 11%, HER-2 negative T2 N0 M0 stage II a status post neoadjuvant chemotherapy followed by surgery February 2011and wason antiestrogen therapy with Femara from January 2011-July 2017  New onset mid back pain: MRI revealedT11 severe pathologic fracture, abnormal signal T10, T11 and T12, moderate canal stenosis at T11  12/02/2017: PET CT scan hypermetabolic lymphadenopathy in the thoracic and upper abdominal lymph nodes, spine and rib metastases, intramuscular metastases the left pectoralis muscle, T11 vertebral fracture pathologic  Biopsy of the T11 vertebral body ER 90%, PR 0%, Ki-67 10%, HER-2 -1+, foundation  1, PDL 1  Pathologic fracture T11: She met with orthopedics regarding kyphoplasty and they determined that there is no surgical options available for  her. ------------------------------------------------------------------- Treatment : 1. Palliative radiation to T11 vertebral body and the pectoralis muscle: 12/20/17-12/26/17 2. current treatment: Ibrance with letrozole along with Delton See or Zometa Patient could not tolerate Faslodex because of intense bone pain as well as hypotension.  Ibrance toxicities: 1.PTY:YPEJYLT from today continue with current dosage of 100 mg Ibrance Zometa every 3 months.  04/09/2018 bone scan: Foci of increased tracer uptake in left ribs and lower thoracic spine consistent with bone metastases. 04/09/2018: CT CAP: Reduced size of the several lymph nodes in the chest, reduced size of the abdominal lymph nodes. No new bone lesions, large left pleural effusion, left suprahilar airspace opacity 3.5 x 3 cm  Severe shortness of breath with recurrent pleural effusions: Patient underwent 2 thoracentesis so far.  I called and discussed the case with Dr. Kathlene Cote with interventional radiology.  We will request for Pleurx catheter placement.  We will also request home health to assist her with the drainage of the pleural fluid.  We will reassess her after the CT scans to determine if [name] is working.  I discussed the assessment and treatment plan with the patient. The patient was provided an opportunity to ask questions and all were answered. The patient agreed with the plan and demonstrated an understanding of the instructions. The patient was advised to call back or seek an in-person evaluation if the symptoms worsen or if the condition fails to improve as anticipated.   I provided 15 minutes of non-face-to-face time during this encounter. Harriette Ohara, MD

## 2018-08-03 NOTE — Telephone Encounter (Signed)
Received call from pt stating that she feels as if her let lung is building up fluid again.  Pt states that she has had increased SHOB and is requesting a CXR and possible thoracentesis.  I spoke with Dr. Lindi Adie, and per Dr. Lindi Adie, schedule pt for telephone visit today at 1 PM to discuss treatment possibilities. Pt verbalized understanding.

## 2018-08-04 ENCOUNTER — Other Ambulatory Visit: Payer: Self-pay | Admitting: Radiology

## 2018-08-05 ENCOUNTER — Other Ambulatory Visit: Payer: Self-pay

## 2018-08-05 ENCOUNTER — Ambulatory Visit (HOSPITAL_COMMUNITY)
Admission: RE | Admit: 2018-08-05 | Discharge: 2018-08-05 | Disposition: A | Discharge status: Home / Self Care | Payer: 59 | Source: Ambulatory Visit | Attending: Diagnostic Radiology | Admitting: Diagnostic Radiology

## 2018-08-05 ENCOUNTER — Encounter (HOSPITAL_COMMUNITY): Payer: Self-pay

## 2018-08-05 ENCOUNTER — Ambulatory Visit (HOSPITAL_COMMUNITY)
Admission: RE | Admit: 2018-08-05 | Discharge: 2018-08-05 | Disposition: A | Discharge status: Home / Self Care | Payer: 59 | Source: Ambulatory Visit | Attending: Hematology and Oncology | Admitting: Hematology and Oncology

## 2018-08-05 DIAGNOSIS — M1990 Unspecified osteoarthritis, unspecified site: Secondary | ICD-10-CM | POA: Diagnosis not present

## 2018-08-05 DIAGNOSIS — C50512 Malignant neoplasm of lower-outer quadrant of left female breast: Secondary | ICD-10-CM

## 2018-08-05 DIAGNOSIS — Z882 Allergy status to sulfonamides status: Secondary | ICD-10-CM | POA: Diagnosis not present

## 2018-08-05 DIAGNOSIS — Z9013 Acquired absence of bilateral breasts and nipples: Secondary | ICD-10-CM | POA: Insufficient documentation

## 2018-08-05 DIAGNOSIS — E039 Hypothyroidism, unspecified: Secondary | ICD-10-CM | POA: Insufficient documentation

## 2018-08-05 DIAGNOSIS — J910 Malignant pleural effusion: Secondary | ICD-10-CM | POA: Diagnosis not present

## 2018-08-05 DIAGNOSIS — Z79899 Other long term (current) drug therapy: Secondary | ICD-10-CM | POA: Diagnosis not present

## 2018-08-05 DIAGNOSIS — Z79890 Hormone replacement therapy (postmenopausal): Secondary | ICD-10-CM | POA: Insufficient documentation

## 2018-08-05 DIAGNOSIS — J90 Pleural effusion, not elsewhere classified: Secondary | ICD-10-CM

## 2018-08-05 DIAGNOSIS — Z170 Estrogen receptor positive status [ER+]: Secondary | ICD-10-CM | POA: Insufficient documentation

## 2018-08-05 DIAGNOSIS — Z4803 Encounter for change or removal of drains: Secondary | ICD-10-CM | POA: Diagnosis present

## 2018-08-05 DIAGNOSIS — Z853 Personal history of malignant neoplasm of breast: Secondary | ICD-10-CM | POA: Diagnosis not present

## 2018-08-05 DIAGNOSIS — E7800 Pure hypercholesterolemia, unspecified: Secondary | ICD-10-CM | POA: Diagnosis not present

## 2018-08-05 DIAGNOSIS — Z9689 Presence of other specified functional implants: Secondary | ICD-10-CM

## 2018-08-05 HISTORY — PX: IR PERC PLEURAL DRAIN W/INDWELL CATH W/IMG GUIDE: IMG5383

## 2018-08-05 LAB — CBC WITH DIFFERENTIAL/PLATELET
Abs Immature Granulocytes: 0 10*3/uL (ref 0.00–0.07)
Basophils Absolute: 0.1 10*3/uL (ref 0.0–0.1)
Basophils Relative: 3 %
Eosinophils Absolute: 0.1 10*3/uL (ref 0.0–0.5)
Eosinophils Relative: 3 %
HCT: 36.8 % (ref 36.0–46.0)
Hemoglobin: 12 g/dL (ref 12.0–15.0)
Immature Granulocytes: 0 %
Lymphocytes Relative: 33 %
Lymphs Abs: 0.6 10*3/uL — ABNORMAL LOW (ref 0.7–4.0)
MCH: 35.5 pg — ABNORMAL HIGH (ref 26.0–34.0)
MCHC: 32.6 g/dL (ref 30.0–36.0)
MCV: 108.9 fL — ABNORMAL HIGH (ref 80.0–100.0)
Monocytes Absolute: 0.2 10*3/uL (ref 0.1–1.0)
Monocytes Relative: 11 %
Neutro Abs: 0.9 10*3/uL — ABNORMAL LOW (ref 1.7–7.7)
Neutrophils Relative %: 50 %
Platelets: 231 10*3/uL (ref 150–400)
RBC: 3.38 MIL/uL — ABNORMAL LOW (ref 3.87–5.11)
RDW: 13.9 % (ref 11.5–15.5)
WBC: 1.8 10*3/uL — ABNORMAL LOW (ref 4.0–10.5)
nRBC: 0 % (ref 0.0–0.2)

## 2018-08-05 LAB — PROTIME-INR
INR: 0.9 (ref 0.8–1.2)
Prothrombin Time: 11.9 seconds (ref 11.4–15.2)

## 2018-08-05 LAB — BASIC METABOLIC PANEL
Anion gap: 10 (ref 5–15)
BUN: 16 mg/dL (ref 8–23)
CO2: 24 mmol/L (ref 22–32)
Calcium: 9.3 mg/dL (ref 8.9–10.3)
Chloride: 105 mmol/L (ref 98–111)
Creatinine, Ser: 0.91 mg/dL (ref 0.44–1.00)
GFR calc Af Amer: >60 mL/min (ref >60)
GFR calc non Af Amer: >60 mL/min (ref >60)
Glucose, Bld: 100 mg/dL — ABNORMAL HIGH (ref 70–99)
Potassium: 3.7 mmol/L (ref 3.5–5.1)
Sodium: 139 mmol/L (ref 135–145)

## 2018-08-05 MED ORDER — LIDOCAINE HCL 1 % IJ SOLN
INTRAMUSCULAR | Status: AC
  Filled 2018-08-05: qty 20

## 2018-08-05 MED ORDER — CEFAZOLIN SODIUM-DEXTROSE 2-4 GM/100ML-% IV SOLN
INTRAVENOUS | Status: AC
  Administered 2018-08-05: 10:00:00 2 g via INTRAVENOUS
  Filled 2018-08-05: qty 100

## 2018-08-05 MED ORDER — HYDROCODONE-ACETAMINOPHEN 5-325 MG PO TABS: 1.0000 | ORAL_TABLET | ORAL | Status: DC | PRN

## 2018-08-05 MED ORDER — LIDOCAINE HCL (PF) 1 % IJ SOLN
INTRAMUSCULAR | Status: AC | PRN
  Administered 2018-08-05: 23 mL

## 2018-08-05 MED ORDER — SODIUM CHLORIDE 0.9 % IV SOLN
INTRAVENOUS | Status: DC
  Administered 2018-08-05: 09:00:00 via INTRAVENOUS

## 2018-08-05 MED ORDER — FENTANYL CITRATE (PF) 100 MCG/2ML IJ SOLN
INTRAMUSCULAR | Status: AC | PRN
  Administered 2018-08-05 (×2): 50 ug via INTRAVENOUS

## 2018-08-05 MED ORDER — MIDAZOLAM HCL 2 MG/2ML IJ SOLN
INTRAMUSCULAR | Status: AC | PRN
  Administered 2018-08-05 (×4): 1 mg via INTRAVENOUS

## 2018-08-05 MED ORDER — NALOXONE HCL 0.4 MG/ML IJ SOLN
INTRAMUSCULAR | Status: AC
  Filled 2018-08-05: qty 1

## 2018-08-05 MED ORDER — FENTANYL CITRATE (PF) 100 MCG/2ML IJ SOLN
INTRAMUSCULAR | Status: AC
  Filled 2018-08-05: qty 4

## 2018-08-05 MED ORDER — FLUMAZENIL 0.5 MG/5ML IV SOLN
INTRAVENOUS | Status: AC
  Filled 2018-08-05: qty 5

## 2018-08-05 MED ORDER — CEFAZOLIN SODIUM-DEXTROSE 2-4 GM/100ML-% IV SOLN
2.0000 g | INTRAVENOUS | Status: AC
  Administered 2018-08-05: 10:00:00 2 g via INTRAVENOUS

## 2018-08-05 MED ORDER — MIDAZOLAM HCL 2 MG/2ML IJ SOLN
INTRAMUSCULAR | Status: AC
  Filled 2018-08-05: qty 4

## 2018-08-05 NOTE — H&P (Signed)
Referring Physician(s): Nicholas Lose  Supervising Physician: Markus Daft  Patient Status:  [facility]  Chief Complaint: Shortness of breath, cough, chest pressure, breast cancer   Subjective: Patient familiar to IR service from prior T11 lytic lesion biopsy on 12/11/2017 as well as prior left thoracenteses, latest on 07/15/2018 yielding 1.5 liters.  She has a known history of metastatic left breast carcinoma and presents again today for left Pleurx catheter placement for recurrent symptomatic left pleural effusion.  She currently denies fever, headache, abdominal pain, back pain, nausea, vomiting or bleeding.  She does have chest pressure, shortness of breath and coughing.  Past Medical History:  Diagnosis Date  . Arthritis   . Breast cancer ([place]) 2010   Left  . Chronic sinusitis   . Family history of adverse reaction to anesthesia    sister-nausea/vomiting  . History of chemotherapy 01/2009-03/2009  . Hypercholesteremia   . Hypothyroid    "inactive thyroid"  . Migraines   . RSD (reflex sympathetic dystrophy)    Past Surgical History:  Procedure Laterality Date  . BREAST RECONSTRUCTION    . EYE SURGERY Bilateral 7/18, 10/18  . KNEE ARTHROSCOPY Bilateral   . KNEE SURGERY Right    Two surgeries  . MASTECTOMY Bilateral 05/22/2009  . MASTECTOMY Bilateral 2011   restrictions on left arm-no labs/bloodpressure  . NASAL SINUS SURGERY    . PORTA CATH INSERTION  12/2008  . PORTA CATH REMOVAL  05/2009  . ROOT CANAL    . SHOULDER SURGERY Right   . TOTAL HIP ARTHROPLASTY Left 05/26/2017   Procedure: TOTAL HIP ARTHROPLASTY ANTERIOR APPROACH;  Surgeon: Frederik Pear, MD;  Location: [facility];  Service: Orthopedics;  Laterality: Left;     Allergies: Shellfish allergy and Sulfa antibiotics  Medications: Prior to Admission medications   Medication Sig Start Date End Date Taking? Authorizing Provider  Biotin 10000 MCG TABS Take 10,000 mcg by mouth daily.    Yes [provider]   Cholecalciferol (VITAMIN D PO) Take 5,000 Units by mouth daily.    Yes [provider]  letrozole (FEMARA) 2.5 MG tablet Take 1 tablet (2.5 mg total) by mouth daily. 01/12/18  Yes Nicholas Lose, MD  levothyroxine (SYNTHROID, LEVOTHROID) 25 MCG tablet Take 25 mcg by mouth daily before breakfast.    Yes [provider]  Multiple Vitamin (MULTIVITAMIN) tablet Take 1 tablet by mouth daily.   Yes [provider]  Omega-3 Fatty Acids (SUPER OMEGA 3 EPA/DHA PO) Take 1 capsule by mouth daily.   Yes [provider]  palbociclib (IBRANCE) 100 MG capsule Take 1 capsule (100 mg total) by mouth daily with breakfast. Take whole with food. Take for 21 days on, 7 days off, repeat every 28 days. 05/25/18  Yes Nicholas Lose, MD  prednisoLONE acetate (PRED FORTE) 1 % ophthalmic suspension Place 1 drop into both eyes daily.   Yes [provider]  amoxicillin-clavulanate (AUGMENTIN) 875-125 MG tablet Take 1 tablet by mouth 2 (two) times daily. 06/17/18   Tanner, Lyndon Code., PA-C  benzonatate (TESSALON) 100 MG capsule Take 1 capsule (100 mg total) by mouth 3 (three) times daily as needed for cough. 06/17/18   Tanner, Lyndon Code., PA-C  HYDROcodone-homatropine (HYCODAN) 5-1.5 MG/5ML syrup Take 5 mLs by mouth every 6 (six) hours as needed for cough. 06/17/18   Harle Stanford., PA-C  magic mouthwash w/lidocaine SOLN Take 5 mLs by mouth 3 (three) times daily as needed for mouth pain. 05/25/18   Nicholas Lose, MD  ondansetron (ZOFRAN) 8 MG tablet Take 1 tablet (8 mg total) by mouth every 8 (eight) hours as needed for nausea or vomiting. 12/19/17   Kyung Rudd, MD  oxyCODONE-acetaminophen (PERCOCET) 10-325 MG tablet Take 1 tablet by mouth every 8 (eight) hours as needed for up to 5 days for pain. 08/03/18 08/08/18  Nicholas Lose, MD  traMADol (ULTRAM) 50 MG tablet Take 1 tablet (50 mg total) by mouth every 6 (six) hours as needed. 05/25/18   Nicholas Lose, MD     Vital Signs: BP (!) 143/94   Pulse (!)  110   Temp 98.3 F (36.8 C) (Oral)   Resp 20   SpO2 100%   Physical Exam awake, alert.  Chest with diminished breath sounds on left, right clear, heart with tachycardic but regular rhythm.  Abdomen soft, positive bowel sounds, nontender.  No lower extremity edema.   Imaging: No results found.  Labs:  CBC: Recent Labs    03/18/18 1040 04/14/18 1036 05/25/18 0919 07/03/20 4825  WBC DUPLICATE 2.2* 2.5* 1.8*  HGB DUPLICATE 00.3* 70.4 88.8  HCT DUPLICATE 91.6* 94.5 03.8  PLT DUPLICATE 882 800* 349    COAGS: Recent Labs    12/11/17 0711 08/05/18 0820  INR 0.94 0.9    BMP: Recent Labs    01/28/18 1504 03/18/18 0853 04/14/18 1036 05/25/18 0919  NA 141 142 141 142  K 4.5 4.1 4.3 3.8  CL 105 107 107 107  CO2 26 26 24 26   GLUCOSE 99 90 91 91  BUN 20 15 17 16   CALCIUM 9.6 9.2 9.2 9.2  CREATININE 1.56* 0.82 0.86 0.90  GFRNONAA 34* >60 >60 >60  GFRAA 40* >60 >60 >60    LIVER FUNCTION TESTS: Recent Labs    01/28/18 1504 03/18/18 0853 04/14/18 1036 05/25/18 0919  BILITOT 0.3 0.3 0.5 0.7  AST 23 22 18 22   ALT 18 12 13 13   ALKPHOS 103 101 77 77  PROT 7.0 7.3 7.1 7.3  ALBUMIN 3.7 3.6 3.8 3.8    Assessment and Plan: Patient with history of metastatic breast cancer and recurrent symptomatic left pleural effusion.  She presents today for left Pleurx catheter placement.  Details/risks of procedure, including but not limited to, internal bleeding, infection, injury to adjacent structures, inability to place catheter due to minimal fluid discussed with patient with her understanding and consent.  WBC today 1.8.     Electronically Signed: D. Rowe Robert, PA-C 08/05/2018, 8:46 AM   I spent a total of 20 minutes at the the patient's bedside AND on the patient's hospital floor or unit, greater than 50% of which was counseling/coordinating care for left Pleurx catheter placement

## 2018-08-05 NOTE — Procedures (Signed)
Interventional Radiology Procedure:   Indications: Metastatic breast cancer and recurrent left pleural effusion with shortness of breath  Procedure: Placement of left pleural tunneled catheter (Pleurx)  Findings: Large left pleural effusion.  Pleurx placed in left pleural space and approx. 1 liter of yellow fluid removed.  Complications: None     EBL: Less than 10 ml  Plan: CXR today and home nurse will see tomorrow.     Gredmarie Delange R. Anselm Pancoast, MD  Pager: [phone]

## 2018-08-05 NOTE — Discharge Instructions (Addendum)
For a video of how to drain the PleurX visit youtube.com    Enter [employer] PleurX Patient Education - Draining Fluid in the search bar.   The publisher of this video is [employer]. It is 12:19 minutes long.     Indwelling Pleural Catheter Home Guide  An indwelling pleural catheter is a thin, flexible tube that is inserted under your skin and into your chest. The catheter drains excess fluid that collects in the area between the chest wall and the lungs (pleural space).After the catheter is inserted, it can be attached to a bottle that collects fluid. The pleural catheter will allow you to drain fluid from your chest at home on a regular basis (sometimes daily). This will eliminate the need for frequent visits to the hospital or clinic to drain the fluid. The catheter may be removed after the excess fluid problem is resolved, usually after 2-3 months. It is important to follow instructions from your health care provider about how to drain and care for your catheter. What are the risks? Generally, this is a safe procedure. However, problems may occur, including:  Infection.  Skin damage around the catheter.  Lung damage.  Failure of the chest tube to work properly.  Spreading of cancer cells along the catheter, if you have cancer. Supplies needed:  Vacuum-sealed drainage bottle with attached drainage line.  Sterile dressing.  Sterile alcohol pads.  Sterile gloves.  Valve cap.  Sterile gauze pads, 4  4 inch (10 cm  10 cm).  Tape.  Adhesive dressing.  Sterile foam catheter pad. How to care for your catheter and insertion site  Wash your hands with soap and warm water before and after touching the catheter or insertion site. If soap and water are not available, use hand sanitizer.  Check your bandage (dressing) daily to make sure it is clean and dry.  Keep the skin around the catheter clean and dry.  Check the catheter regularly for any cracks or kinks in the tubing.  Check your  catheter insertion site every day for signs of infection. Check for: ? Skin breakdown. ? Redness, swelling, or pain. ? Fluid or blood. ? Warmth. ? Pus or a bad smell. How to drain your catheter You may need to drain your catheter every day, or more or less often as told by your health care provider. Follow instructions from your health care provider about how to drain your catheter. You may also refer to instructions that come with the drainage system. To drain the catheter: 1. Wash your hands with soap and warm water. If soap and water are not available, use hand sanitizer. 2. Carefully remove the dressing from around the catheter. 3. Wash your hands again. 4. Put on the gloves provided. 5. Prepare the vacuum-sealed drainage bottle and drainage line. Close the drainage line of the vacuum-sealed drainage bottle by squeezing the pinch clamp or rolling the wheel of the roller clamp toward the bottle. The vacuum in the bottle will be lost if the line is not closed completely. 6. Remove the access tip cover from the drainage line. Do not touch the end. Set it on a sterile surface. 7. Remove the catheter valve cap and throw it away. 8. Use an alcohol pad to clean the end of the catheter. 9. Insert the access tip into the catheter valve. Make sure the valve and access tip are securely connected. Listen for a click to confirm that they are connected. 10. Insert the T plunger to break the  vacuum seal on the drainage bottle. 11. Open the clamp on the drainage line. 12. Allow the catheter to drain. Keep the catheter and the drainage bottle below the level of your chest. There may be a one-way valve on the end of the tubing that will allow liquid and air to flow out of the catheter without letting air inside. 13. Drain the amount of fluid as told by your health care provider. It usually takes 5-15 minutes. Do not drain more than 1000 mL of fluid. You may feel a little discomfort while you are draining. If  the pain is severe, stop draining and contact your health care provider. 14. After you finish draining the catheter, remove the drainage bottle tubing from the catheter. 15. Use a clean alcohol pad to wipe the catheter tip. 16. Place a clean cap on the end of the catheter. 17. Use an alcohol pad to clean the skin around the catheter. 18. Allow the skin to air-dry. 19. Put the catheter pad on your skin. Curl the catheter into loops and place it on the pad. Do not place the catheter on your skin. 20. Replace the dressing over the catheter. 21. Discard the drainage bottle as instructed by your health care provider. Do not reuse the drainage bottle. How to change your dressing Change your dressing at least once a week, or more often if needed to keep the dressing dry. Be sure to change the dressing whenever it becomes moist. Your health care provider will tell you how often to change your dressing. 1. Wash your hands with soap and warm water. If soap and water are not available, use hand sanitizer. 2. Gently remove the old dressing. Avoid using scissors to remove the dressing. Sharp objects may damage the catheter. 3. Wash the skin around the insertion site with mild, fragrance-free soap and warm water. Rinse well, then pat the area dry with a clean cloth. 4. Check the skin around the catheter for signs of infection. Check for: ? Skin breakdown. ? Redness, swelling, or pain. ? Fluid or blood. ? Warmth. ? Pus or a bad smell. 5. If your catheter was stitched (sutured) to your skin, look at the suture to make sure it is still anchored in your skin. 6. Do not apply creams, ointments, or alcohol to the area. Let your skin air-dry completely before you apply a new dressing. 7. Curl the catheter into loops and place it on the sterile catheter pad. Do not place the catheter on your skin. 8. If you do not have a pad, use a clean dressing. Slide the dressing under the disk that holds the drainage catheter  in place. 9. Use gauze to cover the catheter and the catheter pad. The catheter should rest on the pad or dressing, not on your skin. 10. Tape the dressing to your skin. You may be instructed to use an adhesive dressing covering instead of gauze and tape. 11. Wash your hands with soap and warm water. If soap and water are not available, use hand sanitizer. General recommendations  Always wash your hands with soap and warm water before and after caring for your catheter and drainage bottle. Use a mild, fragrance-free soap. If soap and water are not available, use hand sanitizer.  Always make sure there are no leaks in the catheter or drainage bottle.  Each time you drain the catheter, note the color and amount of fluid.  Do not touch the tip of the catheter or the drainage bottle tubing.  Do not reuse drainage bottles.  Do not take baths, swim, or use a hot tub until your health care provider approves. Ask your health care provider if you may take showers. You may only be allowed to take sponge baths.  Take deep breaths regularly, followed by a cough. Doing this can help to prevent lung infection. Contact a health care provider if:  You have any questions about caring for your catheter or drainage bottle.  You still have pain at the catheter insertion site more than 2 days after your procedure.  You have pain while draining your catheter.  Your catheter becomes bent, twisted, or cracked.  The connection between the catheter and the collection bottle becomes loose.  You have any of these around your catheter insertion site or coming from it: ? Skin breakdown. ? Redness, swelling, or pain. ? Fluid or blood. ? Warmth. ? Pus or a bad smell. Get help right away if:  You have a fever or chills.  You have chest pain.  You have dizziness or shortness of breath.  You have severe redness, swelling, or pain at your catheter insertion site.  The catheter comes out.  The catheter  is blocked or clogged. Summary  An indwelling pleural catheter is a thin, flexible tube that is inserted under your skin and into your chest. The catheter drains excess fluid that collects in the area between the chest wall and the lungs (pleural space).  It is important to follow instructions from your health care provider about how to drain and care for your catheter.  Do not touch the tip of the catheter or the drainage bottle tubing.  Always wash your hands with soap and water before and after caring for your catheter and drainage bottle. If soap and water are not available, use hand sanitizer. This information is not intended to replace advice given to you by your health care provider. Make sure you discuss any questions you have with your health care provider. Document Released: 07/25/2016 Document Revised: 07/25/2016 Document Reviewed: 07/25/2016 Elsevier Interactive Patient Education  2019 [address].    Moderate Conscious Sedation, Adult, Care After These instructions provide you with information about caring for yourself after your procedure. Your health care provider may also give you more specific instructions. Your treatment has been planned according to current medical practices, but problems sometimes occur. Call your health care provider if you have any problems or questions after your procedure. What can I expect after the procedure? After your procedure, it is common:  To feel sleepy for several hours.  To feel clumsy and have poor balance for several hours.  To have poor judgment for several hours.  To vomit if you eat too soon. Follow these instructions at home: For at least 24 hours after the procedure:   Do not: ? Participate in activities where you could fall or become injured. ? Drive. ? Use heavy machinery. ? Drink alcohol. ? Take sleeping pills or medicines that cause drowsiness. ? Make important decisions or sign legal documents. ? Take care of  children on your own.  Rest. Eating and drinking  Follow the diet recommended by your health care provider.  If you vomit: ? Drink water, juice, or soup when you can drink without vomiting. ? Make sure you have little or no nausea before eating solid foods. General instructions  Have a responsible adult stay with you until you are awake and alert.  Take over-the-counter and prescription medicines only as told by  your health care provider.  If you smoke, do not smoke without supervision.  Keep all follow-up visits as told by your health care provider. This is important. Contact a health care provider if:  You keep feeling nauseous or you keep vomiting.  You feel light-headed.  You develop a rash.  You have a fever. Get help right away if:  You have trouble breathing. This information is not intended to replace advice given to you by your health care provider. Make sure you discuss any questions you have with your health care provider. Document Released: 01/20/2013 Document Revised: 09/04/2015 Document Reviewed: 07/22/2015 Elsevier Interactive Patient Education  2019 [employer].

## 2018-08-06 ENCOUNTER — Encounter: Payer: Self-pay | Admitting: *Deleted

## 2018-08-06 DIAGNOSIS — C7951 Secondary malignant neoplasm of bone: Secondary | ICD-10-CM | POA: Diagnosis not present

## 2018-08-06 DIAGNOSIS — C772 Secondary and unspecified malignant neoplasm of intra-abdominal lymph nodes: Secondary | ICD-10-CM | POA: Diagnosis not present

## 2018-08-06 DIAGNOSIS — C50512 Malignant neoplasm of lower-outer quadrant of left female breast: Secondary | ICD-10-CM | POA: Diagnosis not present

## 2018-08-06 NOTE — Progress Notes (Signed)
Received call from Mitzi Hansen, RN with home health wanting to know how often Dr. Lindi Adie wanted pts PlurX catheter drained.  I instructed Sonya that Dr. Lindi Adie would like it drained weekly up to 1 liter and if the pt continues to have [facility] it can be increased to twice a week.  Davy Pique, RN verbalized understanding.

## 2018-08-13 DIAGNOSIS — C772 Secondary and unspecified malignant neoplasm of intra-abdominal lymph nodes: Secondary | ICD-10-CM | POA: Diagnosis not present

## 2018-08-13 DIAGNOSIS — C7951 Secondary malignant neoplasm of bone: Secondary | ICD-10-CM | POA: Diagnosis not present

## 2018-08-13 DIAGNOSIS — C50512 Malignant neoplasm of lower-outer quadrant of left female breast: Secondary | ICD-10-CM | POA: Diagnosis not present

## 2018-08-17 ENCOUNTER — Other Ambulatory Visit: Payer: Self-pay | Admitting: Hematology and Oncology

## 2018-08-17 ENCOUNTER — Telehealth: Payer: Self-pay | Admitting: *Deleted

## 2018-08-17 MED ORDER — CYCLOBENZAPRINE HCL 5 MG PO TABS: 5.0000 mg | ORAL_TABLET | Freq: Three times a day (TID) | ORAL | 1 refills | Status: DC | PRN

## 2018-08-17 MED ORDER — OXYCODONE-ACETAMINOPHEN 10-325 MG PO TABS: 1.0000 | ORAL_TABLET | Freq: Three times a day (TID) | ORAL | 0 refills | Status: DC | PRN

## 2018-08-17 NOTE — Telephone Encounter (Signed)
Received call from pt stating that after she had her PlurX catheter drained last week on Thursday morning she experienced severe pain for 3 days.  She was unable to get out of bed and preform ADLs.  Pt home health nurse suggesting to drain the [place] catheter twice a week with smaller amounts out (500 mLs vs 1 Liter).  I spoke with Dr. Lindi Adie and he sent in script for percocet 10-325 to pts pharmacy and okay for home health to drain catheter twice a week with smaller amount of fluid removal.  I updated pt and she verbalized understanding and would talk with the home health nurse.

## 2018-08-18 ENCOUNTER — Encounter: Payer: Self-pay | Admitting: *Deleted

## 2018-08-18 DIAGNOSIS — C772 Secondary and unspecified malignant neoplasm of intra-abdominal lymph nodes: Secondary | ICD-10-CM | POA: Diagnosis not present

## 2018-08-18 DIAGNOSIS — C50512 Malignant neoplasm of lower-outer quadrant of left female breast: Secondary | ICD-10-CM | POA: Diagnosis not present

## 2018-08-18 DIAGNOSIS — C7951 Secondary malignant neoplasm of bone: Secondary | ICD-10-CM | POA: Diagnosis not present

## 2018-08-18 NOTE — Progress Notes (Signed)
Spoke with Production manager with home health.  Elizabeth [name] was needing verbal orders to remove 500 mLs via PlurX drain twice a week instead of 1 Liter once a week in hopes to decrease pt pain post drainage.

## 2018-08-21 DIAGNOSIS — C772 Secondary and unspecified malignant neoplasm of intra-abdominal lymph nodes: Secondary | ICD-10-CM | POA: Diagnosis not present

## 2018-08-21 DIAGNOSIS — C50512 Malignant neoplasm of lower-outer quadrant of left female breast: Secondary | ICD-10-CM | POA: Diagnosis not present

## 2018-08-21 DIAGNOSIS — C7951 Secondary malignant neoplasm of bone: Secondary | ICD-10-CM | POA: Diagnosis not present

## 2018-08-24 DIAGNOSIS — C772 Secondary and unspecified malignant neoplasm of intra-abdominal lymph nodes: Secondary | ICD-10-CM | POA: Diagnosis not present

## 2018-08-24 DIAGNOSIS — C50512 Malignant neoplasm of lower-outer quadrant of left female breast: Secondary | ICD-10-CM | POA: Diagnosis not present

## 2018-08-24 DIAGNOSIS — C7951 Secondary malignant neoplasm of bone: Secondary | ICD-10-CM | POA: Diagnosis not present

## 2018-08-27 DIAGNOSIS — C772 Secondary and unspecified malignant neoplasm of intra-abdominal lymph nodes: Secondary | ICD-10-CM | POA: Diagnosis not present

## 2018-08-27 DIAGNOSIS — C7951 Secondary malignant neoplasm of bone: Secondary | ICD-10-CM | POA: Diagnosis not present

## 2018-08-27 DIAGNOSIS — C50512 Malignant neoplasm of lower-outer quadrant of left female breast: Secondary | ICD-10-CM | POA: Diagnosis not present

## 2018-08-31 DIAGNOSIS — C7951 Secondary malignant neoplasm of bone: Secondary | ICD-10-CM | POA: Diagnosis not present

## 2018-08-31 DIAGNOSIS — C772 Secondary and unspecified malignant neoplasm of intra-abdominal lymph nodes: Secondary | ICD-10-CM | POA: Diagnosis not present

## 2018-08-31 DIAGNOSIS — C50512 Malignant neoplasm of lower-outer quadrant of left female breast: Secondary | ICD-10-CM | POA: Diagnosis not present

## 2018-09-01 DIAGNOSIS — C50919 Malignant neoplasm of unspecified site of unspecified female breast: Secondary | ICD-10-CM | POA: Diagnosis not present

## 2018-09-01 DIAGNOSIS — R180 Malignant ascites: Secondary | ICD-10-CM | POA: Diagnosis not present

## 2018-09-03 ENCOUNTER — Telehealth: Payer: Self-pay | Admitting: Student

## 2018-09-03 ENCOUNTER — Telehealth: Payer: Self-pay | Admitting: *Deleted

## 2018-09-03 DIAGNOSIS — C7951 Secondary malignant neoplasm of bone: Secondary | ICD-10-CM | POA: Diagnosis not present

## 2018-09-03 DIAGNOSIS — C50512 Malignant neoplasm of lower-outer quadrant of left female breast: Secondary | ICD-10-CM | POA: Diagnosis not present

## 2018-09-03 DIAGNOSIS — C772 Secondary and unspecified malignant neoplasm of intra-abdominal lymph nodes: Secondary | ICD-10-CM | POA: Diagnosis not present

## 2018-09-03 NOTE — Telephone Encounter (Signed)
Received call from Mitzi Hansen, RN with home health stating that pt has had increased sharp knife like pain with plurX and has been experiencing the pain x1 week.  States pt picked up granddaughter a week ago and that is when the severe pain started.  Monday 08/31/2018 230 mLs removed and today 09/03/2018 200 mLs removed.  Per Dr. Lindi Adie, IR needs to be contacted to address situation.  RN spoke with Myriam Jacobson, RN in IR and explained the pt situation.  Per Myriam Jacobson, RN she will review with PA and MD and they will reach out to the pt to decide what action to take.  RN called Nevin Bloodgood and explained that MD with IR will reach out to her tomorrow, pt verbalized understanding.

## 2018-09-03 NOTE — Telephone Encounter (Signed)
Patient called re: Pleurx catheter which was placed 08/05/18.  Attempted to call back, however no answer.  Will continue to try to reach patient.   Brynda Greathouse, MS RD PA-C 4:10 PM

## 2018-09-04 ENCOUNTER — Other Ambulatory Visit (HOSPITAL_COMMUNITY): Payer: Self-pay | Admitting: Interventional Radiology

## 2018-09-04 ENCOUNTER — Other Ambulatory Visit: Payer: Self-pay | Admitting: Student

## 2018-09-04 DIAGNOSIS — J90 Pleural effusion, not elsewhere classified: Secondary | ICD-10-CM

## 2018-09-07 ENCOUNTER — Other Ambulatory Visit: Payer: Self-pay | Admitting: Radiology

## 2018-09-08 ENCOUNTER — Other Ambulatory Visit (HOSPITAL_COMMUNITY): Payer: Self-pay | Admitting: Interventional Radiology

## 2018-09-08 ENCOUNTER — Other Ambulatory Visit: Payer: Self-pay

## 2018-09-08 ENCOUNTER — Ambulatory Visit (HOSPITAL_COMMUNITY)
Admission: RE | Admit: 2018-09-08 | Discharge: 2018-09-08 | Disposition: A | Discharge status: Home / Self Care | Payer: 59 | Source: Ambulatory Visit | Attending: Interventional Radiology | Admitting: Interventional Radiology

## 2018-09-08 ENCOUNTER — Ambulatory Visit (HOSPITAL_COMMUNITY): Payer: 59

## 2018-09-08 ENCOUNTER — Other Ambulatory Visit: Payer: Self-pay | Admitting: Radiology

## 2018-09-08 DIAGNOSIS — J90 Pleural effusion, not elsewhere classified: Secondary | ICD-10-CM | POA: Diagnosis not present

## 2018-09-08 HISTORY — PX: IR PATIENT EVAL TECH 0-60 MINS: IMG5564

## 2018-09-08 NOTE — Procedures (Signed)
Patient came in today with complaint of pain in left chest.  She was concerned the pleurx catheter had been dislodged or not functioning as it should.  Dr Pascal Lux ordered a chest xray to evaluate fluid.  I hooked up a drain bottle and successfully drained approximately 400 cc fluid from left pleurx.  The patient did start to become symptomatic at that point so we discontinued additional draining. The patient had some relief by moving around post draining.  Dr Pascal Lux spoke to the patient regarding options.  Since the catheter was functioning well and was in a good position on the chest xray, It was decided to do no additional procedures at this time.  The patient understood and was advised to call us with any other concerns.

## 2018-09-09 ENCOUNTER — Other Ambulatory Visit: Payer: Self-pay

## 2018-09-09 MED ORDER — TRAMADOL HCL 50 MG PO TABS: 50.0000 mg | ORAL_TABLET | Freq: Four times a day (QID) | ORAL | 0 refills | Status: DC | PRN

## 2018-09-16 ENCOUNTER — Inpatient Hospital Stay: Payer: 59 | Attending: Hematology and Oncology | Admitting: Medical

## 2018-09-16 ENCOUNTER — Other Ambulatory Visit: Payer: Self-pay

## 2018-09-16 ENCOUNTER — Telehealth: Payer: Self-pay

## 2018-09-16 VITALS — BP 132/86 | HR 102 | Temp 98.5°F | Resp 18 | Ht 64.0 in | Wt 118.4 lb

## 2018-09-16 DIAGNOSIS — K5730 Diverticulosis of large intestine without perforation or abscess without bleeding: Secondary | ICD-10-CM | POA: Insufficient documentation

## 2018-09-16 DIAGNOSIS — C7951 Secondary malignant neoplasm of bone: Secondary | ICD-10-CM

## 2018-09-16 DIAGNOSIS — R52 Pain, unspecified: Secondary | ICD-10-CM

## 2018-09-16 DIAGNOSIS — C50512 Malignant neoplasm of lower-outer quadrant of left female breast: Secondary | ICD-10-CM | POA: Insufficient documentation

## 2018-09-16 DIAGNOSIS — R079 Chest pain, unspecified: Secondary | ICD-10-CM | POA: Diagnosis not present

## 2018-09-16 DIAGNOSIS — J90 Pleural effusion, not elsewhere classified: Secondary | ICD-10-CM

## 2018-09-16 DIAGNOSIS — R110 Nausea: Secondary | ICD-10-CM | POA: Insufficient documentation

## 2018-09-16 DIAGNOSIS — Z170 Estrogen receptor positive status [ER+]: Secondary | ICD-10-CM

## 2018-09-16 MED ORDER — GABAPENTIN 300 MG PO CAPS: 300.0000 mg | ORAL_CAPSULE | Freq: Every day | ORAL | 5 refills | Status: DC

## 2018-09-16 MED ORDER — DULOXETINE HCL 30 MG PO CPEP: 30.0000 mg | ORAL_CAPSULE | Freq: Two times a day (BID) | ORAL | 5 refills | Status: DC

## 2018-09-16 NOTE — Progress Notes (Signed)
[facility] Progress Note   Elizabeth [name] [id number] 06-May-1955 63 y.o.  Elizabeth [name] is managed by Dr.Vinay Lindi Adie  Actively treated with chemotherapy/immunotherapy/hormonal therapy: yes  Current therapy:  Ibrance with letrozole along with a bisphosphonate   Next scheduled appointment with provider: 09/28/2018  Assessment: Plan:    Pain - Plan: DULoxetine (CYMBALTA) 30 MG capsule, gabapentin (NEURONTIN) 300 MG capsule   Bilateral chest wall pain greater on the right for the left: The patient was given a prescription for Cymbalta 30 mg p.o. twice daily with instructions to begin once daily for 1 week then increase to twice daily dosing.  She was also given a prescription for Neurontin 300 mg p.o. nightly.  Breast cancer with bony metastatic disease: The patient is managed by r.Vinay Gudena.  She is currently treated with Ibrance with letrozole along with a bisphosphonate.  She has been scheduled for restaging CT scans next week but wishes to defer these for 2 additional weeks at least until her pain is better controlled.  Please see After Visit Summary for patient specific instructions.  Future Appointments  Date Time Provider [place]  10/19/2018  9:15 AM [facility] LAB ONLY [facility] None  10/19/2018 10:00 AM [facility] INJ 1 [facility] [place]  10/19/2018 10:30 AM [facility]-CT 2 [facility] [place]  10/19/2018  1:00 PM [facility]NM 1 [facility] [place]  10/21/2018  3:00 PM Nicholas Lose, MD [facility] None  10/21/2018  3:30 PM [facility] INFUSION [facility] None    No orders of the defined types were placed in this encounter.      Subjective:   Patient ID:  Elizabeth [name] is a 63 y.o. (DOB 11/20/1955) female.  Chief Complaint:  Chief Complaint  Patient presents with  . Pain    R and L side chest, not cardiac    HPI Elizabeth [name]  is a 63 year old with a metastatic ER positive breast cancer metastatic breast cancer involving thoracic and upper abdominal lymph nodes,  spine and ribs. She also has hypermetabolic adenopathy in the left neck; hypermetabolic lymph node versus intramuscular metastasis involving the medial left pectoralis musculature and a pathologic T11 fracture on a PET scan from 12/03/2017.  A biopsy of the T11 vertebral body lytic lesion returned positive for metastatic breast cancer.  She was treated with radiation therapy to T11 from 12/19/2017 through 12/26/2017.  She was next begun on antiestrogen oral therapy.  She did not tolerate Faslodex because of severe hypotension and severe pain in her legs after her injections.  Ibrance with letrozole was begun on 01/12/2018.  She was developed severe shortness of breath and was found to have pleural effusions.  She underwent 2 thoracentesis before a Pleurx catheter was placed.  On a call from home health on 08/18/2018 it was reported that the patient was draining 500 mL via her Pleurx twice a week rather than 1 L once weekly.  Call from home health on 09/03/2018 reported that Elizabeth [name] was having a sharp knifelike pain at the site of a Pleurx for around 1 week.  She had picked up her granddaughter 1 week prior and subsequently developed severe pain at that site.  She was seen in interventional radiology on 09/08/2018 as she was concerned that she may have dislodged her Pleurx catheter was not functioning properly.  A chest x-ray was ordered.  400 mL's of fluid was drained from the left Pleurx.  She became symptomatic at that point.  The draining was discontinued.  It  was decided that the Pleurx catheter was functioning properly and was in good position on chest x-ray.  It was decided that no additional procedures would be completed at that time.  Elizabeth [name] contacted our office earlier today stating that she was having severe discomfort in her bilateral chest.  This pain had increasingly gotten worse over the last week.  She describes the pain is stabbing on her left side.  It was worse than her right.  Pain medications  were providing minimal relief.  She was having shortness of breath with minimal activity.  Home health had been out to assess the patient.  The home health nurse drained approximately 500 mils of pleural fluid.  She denies any rash along her right chest wall.  Her most recent CT scan of the chest, abdomen, and pelvis completed on 04/09/2018 returned showing the following:    IMPRESSION: 1. Reduced size of several of the previously hypermetabolic lymph nodes in the chest. A couple of the lymph nodes are stable in size. Reduced size of the upper abdominal lymph nodes that were previously hypermetabolic. 2. Notably increased sclerosis in the previous bony metastatic lesions. No new bony lesions are observed. 3. There is a large left pleural effusion with enhancing irregularity along the parietal pleural margin-possibility of a malignant effusion is raised. 4. New left suprahilar airspace opacity in the left upper lobe, 3.5 by 3.0 cm. Uncertain significance. Possibly from atelectasis, pneumonia, radiation pneumonitis, or less likely new tumor. 5. Other imaging findings of potential clinical significance: Aortic Atherosclerosis (ICD10-I70.0). Coronary atherosclerosis. Sigmoid colon diverticulosis.  Medications: I have reviewed the patient's current medications.  Allergies:  Allergies  Allergen Reactions  . Shellfish Allergy Anaphylaxis and Other (See Comments)    Allergist said she was "highly allergic to shellfish"  . Sulfa Antibiotics Rash and Other (See Comments)    Looks like severe sunburn    Past Medical History:  Diagnosis Date  . Arthritis   . Breast cancer ([place]) 2010   Left  . Chronic sinusitis   . Family history of adverse reaction to anesthesia    sister-nausea/vomiting  . History of chemotherapy 01/2009-03/2009  . Hypercholesteremia   . Hypothyroid    "inactive thyroid"  . Migraines   . RSD (reflex sympathetic dystrophy)     Past Surgical History:  Procedure Laterality  Date  . BREAST RECONSTRUCTION    . EYE SURGERY Bilateral 7/18, 10/18  . IR PERC PLEURAL DRAIN W/INDWELL CATH W/IMG GUIDE  08/05/2018  . KNEE ARTHROSCOPY Bilateral   . KNEE SURGERY Right    Two surgeries  . MASTECTOMY Bilateral 05/22/2009  . MASTECTOMY Bilateral 2011   restrictions on left arm-no labs/bloodpressure  . NASAL SINUS SURGERY    . PORTA CATH INSERTION  12/2008  . PORTA CATH REMOVAL  05/2009  . ROOT CANAL    . SHOULDER SURGERY Right   . TOTAL HIP ARTHROPLASTY Left 05/26/2017   Procedure: TOTAL HIP ARTHROPLASTY ANTERIOR APPROACH;  Surgeon: Frederik Pear, MD;  Location: [place];  Service: Orthopedics;  Laterality: Left;    Family History  Problem Relation Age of Onset  . Hypertension Mother     Social History   Socioeconomic History  . Marital status: Married    Spouse name: Not on file  . Number of children: Not on file  . Years of education: Not on file  . Highest education level: Not on file  Occupational History  . Not on file  Social Needs  . Emergency planning/management officer  strain: Not on file  . Food insecurity:    Worry: Not on file    Inability: Not on file  . Transportation needs:    Medical: Not on file    Non-medical: Not on file  Tobacco Use  . Smoking status: Never Smoker  . Smokeless tobacco: Never Used  Substance and Sexual Activity  . Alcohol use: No  . Drug use: No  . Sexual activity: Yes    Birth control/protection: Post-menopausal  Lifestyle  . Physical activity:    Days per week: Not on file    Minutes per session: Not on file  . Stress: Not on file  Relationships  . Social connections:    Talks on phone: Not on file    Gets together: Not on file    Attends religious service: Not on file    Active member of club or organization: Not on file    Attends meetings of clubs or organizations: Not on file    Relationship status: Not on file  . Intimate partner violence:    Fear of current or ex partner: Not on file    Emotionally abused: Not on file     Physically abused: Not on file    Forced sexual activity: Not on file  Other Topics Concern  . Not on file  Social History Narrative  . Not on file    Past Medical History, Surgical history, Social history, and Family history were reviewed and updated as appropriate.   Please see review of systems for further details on the patient's review from today.   Review of Systems:  Review of Systems  Constitutional: Negative for chills, diaphoresis and fever.  HENT: Negative for trouble swallowing and voice change.   Respiratory: Negative for cough, choking, chest tightness, shortness of breath, wheezing and stridor.   Cardiovascular: Positive for chest pain (Aching and intermittently stabbing pain greater on the right than the left.). Negative for palpitations.  Gastrointestinal: Negative for abdominal pain, constipation, diarrhea, nausea and vomiting.  Musculoskeletal: Negative for back pain and myalgias.  Neurological: Negative for dizziness, light-headedness and headaches.    Objective:   Physical Exam:  BP 132/86 (BP Location: Left Arm, Patient Position: Sitting)   Pulse (!) 102 Comment: Liza RN is aware  Temp 98.5 F (36.9 C) (Oral)   Resp 18   Ht 5\' 4"  (1.626 m)   Wt 118 lb 6.4 oz (53.7 kg)   SpO2 99%   BMI 20.32 kg/m   Height: 5 feet, 3-1/2 inches ECOG: 1  Physical Exam Constitutional:      General: She is not in acute distress.    Appearance: She is not diaphoretic.  HENT:     Head: Normocephalic and atraumatic.  Eyes:     General: No scleral icterus.       Right eye: No discharge.        Left eye: No discharge.     Conjunctiva/sclera: Conjunctivae normal.  Cardiovascular:     Rate and Rhythm: Regular rhythm. Tachycardia present.     Heart sounds: Normal heart sounds. No murmur. No friction rub. No gallop.   Pulmonary:     Effort: Pulmonary effort is normal. No respiratory distress.     Breath sounds: No stridor. Examination of the left-middle field reveals  decreased breath sounds. Examination of the left-lower field reveals decreased breath sounds. Decreased breath sounds present. No wheezing or rales.  Musculoskeletal:        General: Tenderness (Mild point tenderness over  the low thoracic spine.) present.  Skin:    General: Skin is warm and dry.     Coloration: Skin is not pale.     Findings: No erythema.  Neurological:     Mental Status: She is alert.     Coordination: Coordination normal.     Gait: Gait normal.  Psychiatric:        Behavior: Behavior normal.        Thought Content: Thought content normal.        Judgment: Judgment normal.     Lab Review:     Component Value Date/Time   NA 139 08/05/2018 0820   NA 142 04/11/2014 0804   K 3.7 08/05/2018 0820   K 4.0 04/11/2014 0804   CL 105 08/05/2018 0820   CL 105 08/06/2012 1313   CO2 24 08/05/2018 0820   CO2 28 04/11/2014 0804   GLUCOSE 100 (H) 08/05/2018 0820   GLUCOSE 82 04/11/2014 0804   GLUCOSE 100 (H) 08/06/2012 1313   BUN 16 08/05/2018 0820   BUN 13.4 04/11/2014 0804   CREATININE 0.91 08/05/2018 0820   CREATININE 0.90 05/25/2018 0919   CREATININE 0.8 04/11/2014 0804   CALCIUM 9.3 08/05/2018 0820   CALCIUM 9.1 04/11/2014 0804   PROT 7.3 05/25/2018 0919   PROT 6.6 04/11/2014 0804   ALBUMIN 3.8 05/25/2018 0919   ALBUMIN 3.7 04/11/2014 0804   AST 22 05/25/2018 0919   AST 16 04/11/2014 0804   ALT 13 05/25/2018 0919   ALT 16 04/11/2014 0804   ALKPHOS 77 05/25/2018 0919   ALKPHOS 91 04/11/2014 0804   BILITOT 0.7 05/25/2018 0919   BILITOT 0.48 04/11/2014 0804   GFRNONAA >60 08/05/2018 0820   GFRNONAA >60 05/25/2018 0919   GFRAA >60 08/05/2018 0820   GFRAA >60 05/25/2018 0919       Component Value Date/Time   WBC 1.8 (L) 08/05/2018 0820   RBC 3.38 (L) 08/05/2018 0820   HGB 12.0 08/05/2018 0820   HGB 12.2 05/25/2018 0919   HGB 13.0 04/11/2014 0804   HCT 36.8 08/05/2018 0820   HCT 39.5 04/11/2014 0804   PLT 231 08/05/2018 0820   PLT 483 (H) 05/25/2018  0919   PLT 359 04/11/2014 0804   MCV 108.9 (H) 08/05/2018 0820   MCV 93.4 04/11/2014 0804   MCH 35.5 (H) 08/05/2018 0820   MCHC 32.6 08/05/2018 0820   RDW 13.9 08/05/2018 0820   RDW 13.1 04/11/2014 0804   LYMPHSABS 0.6 (L) 08/05/2018 0820   LYMPHSABS 1.8 04/11/2014 0804   MONOABS 0.2 08/05/2018 0820   MONOABS 0.5 04/11/2014 0804   EOSABS 0.1 08/05/2018 0820   EOSABS 0.2 04/11/2014 0804   BASOSABS 0.1 08/05/2018 0820   BASOSABS 0.0 04/11/2014 0804   -------------------------------  Imaging from last 24 hours (if applicable):  Radiology interpretation: Dg Chest 2 View  Result Date: 09/08/2018 CLINICAL DATA:  Shortness of breath, pleural effusion. EXAM: CHEST - 2 VIEW COMPARISON:  Radiograph of August 05, 2018. FINDINGS: Stable cardiomegaly. No pneumothorax is noted. Right lung is clear. Large left pleural effusion is noted with probable underlying atelectasis or infiltrate. Pleural drainage catheter is again noted in the left hemithorax. Bony thorax is unremarkable. IMPRESSION: Stable position of left-sided pleural drainage catheter. Large left pleural effusion is noted which may be slightly increased compared to prior exam. Electronically Signed   By: Marijo Conception M.D.   On: 09/08/2018 10:25        This case was discussed with Dr.  Gudena. He expresses agreement with my management of this patient.

## 2018-09-16 NOTE — Patient Instructions (Signed)
Neuropathic Pain Neuropathic pain is pain caused by damage to the nerves that are responsible for certain sensations in your body (sensory nerves). The pain can be caused by:  Damage to the sensory nerves that send signals to your spinal cord and brain (peripheral nervous system).  Damage to the sensory nerves in your brain or spinal cord (central nervous system). Neuropathic pain can make you more sensitive to pain. Even a minor sensation can feel very painful. This is usually a long-term condition that can be difficult to treat. The type of pain differs from person to person. It may:  Start suddenly (acute), or it may develop slowly and last for a long time (chronic).  Come and go as damaged nerves heal, or it may stay at the same level for years.  Cause emotional distress, loss of sleep, and a lower quality of life. What are the causes? The most common cause of this condition is diabetes. Many other diseases and conditions can also cause neuropathic pain. Causes of neuropathic pain can be classified as:  Toxic. This is caused by medicines and chemicals. The most common cause of toxic neuropathic pain is damage from cancer treatments (chemotherapy).  Metabolic. This can be caused by: ? Diabetes. This is the most common disease that damages the nerves. ? Lack of vitamin B from long-term alcohol abuse.  Traumatic. Any injury that cuts, crushes, or stretches a nerve can cause damage and pain. A common example is feeling pain after losing an arm or leg (phantom limb pain).  Compression-related. If a sensory nerve gets trapped or compressed for a long period of time, the blood supply to the nerve can be cut off.  Vascular. Many blood vessel diseases can cause neuropathic pain by decreasing blood supply and oxygen to nerves.  Autoimmune. This type of pain results from diseases in which the body's defense system (immune system) mistakenly attacks sensory nerves. Examples of autoimmune diseases  that can cause neuropathic pain include lupus and multiple sclerosis.  Infectious. Many types of viral infections can damage sensory nerves and cause pain. Shingles infection is a common cause of this type of pain.  Inherited. Neuropathic pain can be a symptom of many diseases that are passed down through families (genetic). What increases the risk? You are more likely to develop this condition if:  You have diabetes.  You smoke.  You drink too much alcohol.  You are taking certain medicines, including medicines that kill cancer cells (chemotherapy) or that treat immune system disorders. What are the signs or symptoms? The main symptom is pain. Neuropathic pain is often described as:  Burning.  Shock-like.  Stinging.  Hot or cold.  Itching. How is this diagnosed? No single test can diagnose neuropathic pain. It is diagnosed based on:  Physical exam and your symptoms. Your health care provider will ask you about your pain. You may be asked to use a pain scale to describe how bad your pain is.  Tests. These may be done to see if you have a high sensitivity to pain and to help find the cause and location of any sensory nerve damage. They include: ? Nerve conduction studies to test how well nerve signals travel through your sensory nerves (electrodiagnostic testing). ? Stimulating your sensory nerves through electrodes on your skin and measuring the response in your spinal cord and brain (somatosensory evoked potential).  Imaging studies, such as: ? X-rays. ? CT scan. ? MRI. How is this treated? Treatment for neuropathic pain may change   over time. You may need to try different treatment options or a combination of treatments. Some options include:  Treating the underlying cause of the neuropathy, such as diabetes, kidney disease, or vitamin deficiencies.  Stopping medicines that can cause neuropathy, such as chemotherapy.  Medicine to relieve pain. Medicines may include: ?  Prescription or over-the-counter pain medicine. ? Anti-seizure medicine. ? Antidepressant medicines. ? Pain-relieving patches that are applied to painful areas of skin. ? A medicine to numb the area (local anesthetic), which can be injected as a nerve block.  Transcutaneous nerve stimulation. This uses electrical currents to block painful nerve signals. The treatment is painless.  Alternative treatments, such as: ? Acupuncture. ? Meditation. ? Massage. ? Physical therapy. ? Pain management programs. ? Counseling. Follow these instructions at home: Medicines   Take over-the-counter and prescription medicines only as told by your health care provider.  Do not drive or use heavy machinery while taking prescription pain medicine.  If you are taking prescription pain medicine, take actions to prevent or treat constipation. Your health care provider may recommend that you: ? Drink enough fluid to keep your urine pale yellow. ? Eat foods that are high in fiber, such as fresh fruits and vegetables, whole grains, and beans. ? Limit foods that are high in fat and processed sugars, such as fried or sweet foods. ? Take an over-the-counter or prescription medicine for constipation. Lifestyle   Have a good support system at home.  Consider joining a chronic pain support group.  Do not use any products that contain nicotine or tobacco, such as cigarettes and e-cigarettes. If you need help quitting, ask your health care provider.  Do not drink alcohol. General instructions  Learn as much as you can about your condition.  Work closely with all your health care providers to find the treatment plan that works best for you.  Ask your health care provider what activities are safe for you.  Keep all follow-up visits as told by your health care provider. This is important. Contact a health care provider if:  Your pain treatments are not working.  You are having side effects from your  medicines.  You are struggling with tiredness (fatigue), mood changes, depression, or anxiety. Summary  Neuropathic pain is pain caused by damage to the nerves that are responsible for certain sensations in your body (sensory nerves).  Neuropathic pain may come and go as damaged nerves heal, or it may stay at the same level for years.  Neuropathic pain is usually a long-term condition that can be difficult to treat. Consider joining a chronic pain support group. This information is not intended to replace advice given to you by your health care provider. Make sure you discuss any questions you have with your health care provider. Document Released: 12/28/2003 Document Revised: 04/18/2017 Document Reviewed: 04/18/2017 Elsevier Interactive Patient Education  2019 [employer]  

## 2018-09-16 NOTE — Telephone Encounter (Signed)
RN returned call. Pt c/o severe discomfort in bilateral chest.  Pain has increasingly got worse over the past week.  Pain described as a stabbing pain, left side worse than right.  Pain medications providing minimal relief.  Shortness of breath with minimal activity.    Home health nurse has been out to assess.  Per patient no fever, or sign of infection have been reported.  Home health nurse able to drain approximately 500 mL.  Pt states, "something is just not right and I need some help."  RN collaborated with symptom Hotel manager.  Appointment provided.  Pt aware, will have someone drive to appointment.

## 2018-09-16 NOTE — Progress Notes (Signed)
Pt reports chronic aching & sharp pain around pleurex drain (L side lower chest/upper abdomen) but also now on opposite side of thorax that increases with movement and deep breathing.  Denies radiation or numbness/tingling or cardiac issues or increased SOB/DOE from baseline.  Pt reports decrease recently in output from drain at home, but states recent CXR found no abnormalities.  Bandage around drain not removed by RN at this time, PA Lucianne Lei to assess instead.

## 2018-09-24 ENCOUNTER — Ambulatory Visit (HOSPITAL_COMMUNITY): Admission: RE | Admit: 2018-09-24 | Payer: Self-pay | Source: Ambulatory Visit

## 2018-09-24 ENCOUNTER — Telehealth: Payer: Self-pay | Admitting: *Deleted

## 2018-09-24 ENCOUNTER — Encounter (HOSPITAL_COMMUNITY): Payer: Self-pay

## 2018-09-24 ENCOUNTER — Other Ambulatory Visit: Payer: 59

## 2018-09-24 NOTE — Telephone Encounter (Signed)
"  [place] [phone]).  I'm in the home of Elizabeth [name] draining PleuRX drain.  Currently 150 ml pink, blood-tinged drainage.  Her normal color is amber.  We drain every three days.  They are aware of the pain she has with the drain.  Monday and for the past week and a half PleuRx has emptied 500 ml.  What do we need to do at this time.  Does she need to be seen?"  4:25 pm: Pain beginning so I don't think more will be drained.  Currently 350 ml clear, red looking fluid.  Started two new medications so this may be a side effect.  This is the first decrease in drainage related to pain."

## 2018-09-24 NOTE — Telephone Encounter (Signed)
Received call from [place] [phone]) stating while draining pt plurX catheter today, noted the drainage and tubing to be a pink blood tinged color, was able to drain 400 mLs.  States pts drainage is usually an Scientist, product/process development.  Pt states she is experiencing some pain at the site and states she is also more drowsy today.  RN notified Davy Pique that our providers are gone for the day and the patient needed to go to the emergency department to be evaluated.  Sonya RN stated pt denied going to the emergency department and would rather stay home and hear back from our office in the morning.  Educated Sonya that pt needed to monitor her temperature and if she experiences more pain or bleeding from site, she needs to go to the emergency department. Verbalized understanding.

## 2018-09-25 ENCOUNTER — Telehealth: Payer: Self-pay | Admitting: *Deleted

## 2018-09-25 NOTE — Telephone Encounter (Signed)
RN consulted with Dr. Lindi Adie this AM regarding pt experiencing pink tinged PlurX draingage output.  Per Dr. Lindi Adie, pt to rest this weekend and on Monday when [place] drains pt PlurX catheter, if it is still pink tinged, to call our office and we can set her up for IR to evaluate the catheter.  RN also educated pt on signs of infection including; fever, redness or drainage from site.  If these symptoms were to be present, pt would need to go to the emergency room.  Pt verbalized understanding. RN also notified Davy Pique, home health RN on plan of care and will contact our office Monday with any issues.

## 2018-09-28 ENCOUNTER — Other Ambulatory Visit: Payer: 59

## 2018-09-28 ENCOUNTER — Ambulatory Visit: Payer: 59

## 2018-09-28 ENCOUNTER — Other Ambulatory Visit: Payer: Self-pay

## 2018-09-28 ENCOUNTER — Ambulatory Visit: Payer: 59 | Admitting: Hematology and Oncology

## 2018-09-28 ENCOUNTER — Encounter (HOSPITAL_COMMUNITY): Payer: Self-pay | Admitting: Interventional Radiology

## 2018-09-28 ENCOUNTER — Telehealth: Payer: Self-pay | Admitting: Physician Assistant

## 2018-09-28 DIAGNOSIS — C50512 Malignant neoplasm of lower-outer quadrant of left female breast: Secondary | ICD-10-CM

## 2018-09-28 NOTE — Progress Notes (Signed)
Mitzi Hansen, RN with [facility] notified this RN that patient is still experiencing blood tinged fluid from PleurX drain.  Per [facility] patient with no fever, no signs of infection, and no increase in discomfort.  While this RN was speaking with Davy Pique, at that moment 400cc had been drained from catheter.    RN reviewed with MD.  MD recommendations for IR patient evaluation.  Orders placed.  IR notified, will notify staff when scheduled.  RN updated patient and Davy Pique.  Voiced understanding.  Pt knows to call with any changes prior to scheduling of evaluation.

## 2018-09-28 NOTE — Telephone Encounter (Signed)
  Received a message re: blood tinged fluid from PleurX x 1 week.  Tried to return call to Kathlee Nations at [phone] (left message) and also attempted to call patient at [phone]. No answer.  WENDY S BLAIR PA-C 09/28/2018 4:01 PM

## 2018-09-29 ENCOUNTER — Telehealth: Payer: Self-pay

## 2018-09-29 NOTE — Telephone Encounter (Signed)
Gareth Eagle, PA from IR left voicemail regarding patient's PleurX drain.  Per voicemail no immediate need for evaluation, symptoms and assessment that patient and home health nurse have reported are expected.   RN reviewed with Dr. Lindi Adie.  RN notified Davy Pique, home health nurse.  RN informed if discomfort increases or any signs of infection present to notify office.  Voiced understanding.  Pt notified as well.  No further needs at this time.

## 2018-10-05 ENCOUNTER — Telehealth: Payer: Self-pay | Admitting: Medical

## 2018-10-05 ENCOUNTER — Inpatient Hospital Stay: Payer: 59

## 2018-10-05 ENCOUNTER — Encounter: Payer: 59 | Admitting: Medical

## 2018-10-05 ENCOUNTER — Other Ambulatory Visit: Payer: Self-pay | Admitting: Emergency Medicine

## 2018-10-05 ENCOUNTER — Inpatient Hospital Stay (HOSPITAL_BASED_OUTPATIENT_CLINIC_OR_DEPARTMENT_OTHER): Payer: 59 | Admitting: Medical

## 2018-10-05 ENCOUNTER — Telehealth: Payer: Self-pay | Admitting: *Deleted

## 2018-10-05 ENCOUNTER — Other Ambulatory Visit: Payer: Self-pay

## 2018-10-05 VITALS — BP 131/95 | HR 119 | Temp 97.8°F | Resp 20 | Ht 64.0 in | Wt 115.3 lb

## 2018-10-05 DIAGNOSIS — R197 Diarrhea, unspecified: Secondary | ICD-10-CM

## 2018-10-05 DIAGNOSIS — E860 Dehydration: Secondary | ICD-10-CM

## 2018-10-05 DIAGNOSIS — R110 Nausea: Secondary | ICD-10-CM

## 2018-10-05 DIAGNOSIS — K5730 Diverticulosis of large intestine without perforation or abscess without bleeding: Secondary | ICD-10-CM

## 2018-10-05 DIAGNOSIS — R079 Chest pain, unspecified: Secondary | ICD-10-CM | POA: Diagnosis present

## 2018-10-05 DIAGNOSIS — C50512 Malignant neoplasm of lower-outer quadrant of left female breast: Secondary | ICD-10-CM | POA: Diagnosis present

## 2018-10-05 DIAGNOSIS — R140 Abdominal distension (gaseous): Secondary | ICD-10-CM

## 2018-10-05 DIAGNOSIS — J90 Pleural effusion, not elsewhere classified: Secondary | ICD-10-CM | POA: Diagnosis not present

## 2018-10-05 DIAGNOSIS — C7951 Secondary malignant neoplasm of bone: Secondary | ICD-10-CM | POA: Diagnosis present

## 2018-10-05 LAB — CBC WITH DIFFERENTIAL (CANCER CENTER ONLY)
Abs Immature Granulocytes: 0.01 10*3/uL (ref 0.00–0.07)
Basophils Absolute: 0.1 10*3/uL (ref 0.0–0.1)
Basophils Relative: 3 %
Eosinophils Absolute: 0 10*3/uL (ref 0.0–0.5)
Eosinophils Relative: 1 %
HCT: 32.4 % — ABNORMAL LOW (ref 36.0–46.0)
Hemoglobin: 10.6 g/dL — ABNORMAL LOW (ref 12.0–15.0)
Immature Granulocytes: 0 %
Lymphocytes Relative: 20 %
Lymphs Abs: 0.5 10*3/uL — ABNORMAL LOW (ref 0.7–4.0)
MCH: 35.2 pg — ABNORMAL HIGH (ref 26.0–34.0)
MCHC: 32.7 g/dL (ref 30.0–36.0)
MCV: 107.6 fL — ABNORMAL HIGH (ref 80.0–100.0)
Monocytes Absolute: 0.4 10*3/uL (ref 0.1–1.0)
Monocytes Relative: 16 %
Neutro Abs: 1.4 10*3/uL — ABNORMAL LOW (ref 1.7–7.7)
Neutrophils Relative %: 60 %
Platelet Count: 273 10*3/uL (ref 150–400)
RBC: 3.01 MIL/uL — ABNORMAL LOW (ref 3.87–5.11)
RDW: 13.6 % (ref 11.5–15.5)
WBC Count: 2.4 10*3/uL — ABNORMAL LOW (ref 4.0–10.5)
nRBC: 0 % (ref 0.0–0.2)

## 2018-10-05 LAB — COMPREHENSIVE METABOLIC PANEL
ALT: 16 U/L (ref 0–44)
AST: 36 U/L (ref 15–41)
Albumin: 2.7 g/dL — ABNORMAL LOW (ref 3.5–5.0)
Alkaline Phosphatase: 88 U/L (ref 38–126)
Anion gap: 8 (ref 5–15)
BUN: 13 mg/dL (ref 8–23)
CO2: 28 mmol/L (ref 22–32)
Calcium: 9.5 mg/dL (ref 8.9–10.3)
Chloride: 102 mmol/L (ref 98–111)
Creatinine, Ser: 0.91 mg/dL (ref 0.44–1.00)
GFR calc Af Amer: >60 mL/min (ref >60)
GFR calc non Af Amer: >60 mL/min (ref >60)
Glucose, Bld: 130 mg/dL — ABNORMAL HIGH (ref 70–99)
Potassium: 4 mmol/L (ref 3.5–5.1)
Sodium: 138 mmol/L (ref 135–145)
Total Bilirubin: <0.2 mg/dL — ABNORMAL LOW (ref 0.3–1.2)
Total Protein: 6.6 g/dL (ref 6.5–8.1)

## 2018-10-05 LAB — SAMPLE TO BLOOD BANK

## 2018-10-05 MED ORDER — SIMETHICONE 80 MG PO CHEW: 80.0000 mg | CHEWABLE_TABLET | Freq: Four times a day (QID) | ORAL | 2 refills | Status: AC | PRN

## 2018-10-05 MED ORDER — PROCHLORPERAZINE MALEATE 5 MG PO TABS: 5.0000 mg | ORAL_TABLET | Freq: Four times a day (QID) | ORAL | 2 refills | Status: DC | PRN

## 2018-10-05 MED ORDER — OMEPRAZOLE 20 MG PO CPDR: 20.0000 mg | DELAYED_RELEASE_CAPSULE | Freq: Every day | ORAL | 11 refills | Status: DC

## 2018-10-05 MED ORDER — LOPERAMIDE HCL 2 MG PO CAPS: ORAL_CAPSULE | ORAL | 22 refills | Status: AC

## 2018-10-05 NOTE — Telephone Encounter (Signed)
No los per 6/22. °

## 2018-10-05 NOTE — Patient Instructions (Signed)
Dehydration, Adult  Dehydration is when there is not enough fluid or water in your body. This happens when you lose more fluids than you take in. Dehydration can range from mild to very bad. It should be treated right away to keep it from getting very bad. Symptoms of mild dehydration may include:  Thirst.  Dry lips.  Slightly dry mouth.  Dry, warm skin.  Dizziness. Symptoms of moderate dehydration may include:  Very dry mouth.  Muscle cramps.  Dark pee (urine). Pee may be the color of tea.  Your body making less pee.  Your eyes making fewer tears.  Heartbeat that is uneven or faster than normal (palpitations).  Headache.  Light-headedness, especially when you stand up from sitting.  Fainting (syncope). Symptoms of very bad dehydration may include:  Changes in skin, such as: ? Cold and clammy skin. ? Blotchy (mottled) or pale skin. ? Skin that does not quickly return to normal after being lightly pinched and let go (poor skin turgor).  Changes in body fluids, such as: ? Feeling very thirsty. ? Your eyes making fewer tears. ? Not sweating when body temperature is high, such as in hot weather. ? Your body making very little pee.  Changes in vital signs, such as: ? Weak pulse. ? Pulse that is more than 100 beats a minute when you are sitting still. ? Fast breathing. ? Low blood pressure.  Other changes, such as: ? Sunken eyes. ? Cold hands and feet. ? Confusion. ? Lack of energy (lethargy). ? Trouble waking up from sleep. ? Short-term weight loss. ? Unconsciousness. Follow these instructions at home:   If told by your doctor, drink an ORS: ? Make an ORS by using instructions on the package. ? Start by drinking small amounts, about  cup (120 mL) every 5-10 minutes. ? Slowly drink more until you have had the amount that your doctor said to have.  Drink enough clear fluid to keep your pee clear or pale yellow. If you were told to drink an ORS, finish the  ORS first, then start slowly drinking clear fluids. Drink fluids such as: ? Water. Do not drink only water by itself. Doing that can make the salt (sodium) level in your body get too low (hyponatremia). ? Ice chips. ? Fruit juice that you have added water to (diluted). ? Low-calorie sports drinks.  Avoid: ? Alcohol. ? Drinks that have a lot of sugar. These include high-calorie sports drinks, fruit juice that does not have water added, and soda. ? Caffeine. ? Foods that are greasy or have a lot of fat or sugar.  Take over-the-counter and prescription medicines only as told by your doctor.  Do not take salt tablets. Doing that can make the salt level in your body get too high (hypernatremia).  Eat foods that have minerals (electrolytes). Examples include bananas, oranges, potatoes, tomatoes, and spinach.  Keep all follow-up visits as told by your doctor. This is important. Contact a doctor if:  You have belly (abdominal) pain that: ? Gets worse. ? Stays in one area (localizes).  You have a rash.  You have a stiff neck.  You get angry or annoyed more easily than normal (irritability).  You are more sleepy than normal.  You have a harder time waking up than normal.  You feel: ? Weak. ? Dizzy. ? Very thirsty.  You have peed (urinated) only a small amount of very dark pee during 6-8 hours. Get help right away if:  You have   symptoms of very bad dehydration.  You cannot drink fluids without throwing up (vomiting).  Your symptoms get worse with treatment.  You have a fever.  You have a very bad headache.  You are throwing up or having watery poop (diarrhea) and it: ? Gets worse. ? Does not go away.  You have blood or something green (bile) in your throw-up.  You have blood in your poop (stool). This may cause poop to look black and tarry.  You have not peed in 6-8 hours.  You pass out (faint).  Your heart rate when you are sitting still is more than 100 beats a  minute.  You have trouble breathing. This information is not intended to replace advice given to you by your health care provider. Make sure you discuss any questions you have with your health care provider. Document Released: 01/26/2009 Document Revised: 10/20/2015 Document Reviewed: 05/26/2015 Elsevier Interactive Patient Education  2019 [employer]  

## 2018-10-05 NOTE — Telephone Encounter (Signed)
I talk with patient

## 2018-10-05 NOTE — Progress Notes (Addendum)
[facility] Progress Note   Elizabeth [name] [id number] 05-30-1955 63 y.o.  Elizabeth [name] is managed by Dr. Nicholas Lose  Actively treated with chemotherapy/immunotherapy/hormonal therapy: yes  Current therapy: Letrozole, Ibrance, and Zometa  Next scheduled appointment with provider: 10/21/2018  Assessment: Plan:    Bloating - Plan: omeprazole (PRILOSEC) 20 MG capsule, simethicone (GAS-X) 80 MG chewable tablet  Diarrhea, unspecified type - Plan: loperamide (IMODIUM) 2 MG capsule  Nausea without vomiting - Plan: prochlorperazine (COMPAZINE) 5 MG tablet   Bloating: The patient was given a prescription for simethicone 80 mg p.o. 4 times daily as needed and omeprazole 20 mg p.o. once daily.  Diarrhea: Patient was given a prescription for Imodium with instructions to use 1-2 tablets 4 times daily as needed for diarrhea.  Nausea: The patient was given a prescription for Compazine 5 mg p.o. 4 times daily as needed.  The patient was offered IV fluids today.  She declined stating that she would like to to increase fluid intake on her own.  Please see After Visit Summary for patient specific instructions.  Future Appointments  Date Time Provider [place]  10/19/2018  9:15 AM [facility] LAB ONLY [facility] None  10/19/2018 10:00 AM [facility] INJ 1 [facility] [place]  10/19/2018 10:30 AM [facility]-CT 2 [facility] [place]  10/19/2018  1:00 PM [facility] 1 [facility]   10/21/2018  3:00 PM Nicholas Lose, MD [facility] None  10/21/2018  3:30 PM [facility] INFUSION [facility] None    No orders of the defined types were placed in this encounter.      Subjective:   Patient ID:  Elizabeth [name] is a 63 y.o. (DOB 08-17-1955) female.  Chief Complaint:  Chief Complaint  Patient presents with  . Fatigue    HPI Elizabeth [name] is a 63 year old female with a diagnosis of a metastatic ER positive breast cancer metastatic breast cancer involving the thoracic and upper abdominal lymph nodes,  spine and ribs. She also has hypermetabolic adenopathy in the left neck; hypermetabolic lymph node versus intramuscular metastasis involving the medial left pectoralis musculature and a pathologic T11 fracture on a PET scan from 12/03/2017.  A biopsy of the T11 vertebral body lytic lesion returned positive for metastatic breast cancer.  She was treated with radiation therapy to T11 from 12/19/2017 through 12/26/2017.  She was next begun on antiestrogen oral therapy.  She did not tolerate Faslodex because of severe hypotension and severe pain in her legs after her injections.  Ibrance with letrozole was begun on 01/12/2018.  She has a left Pleurx catheter which is being drained twice weekly with approximately 500 mils of fluid removed each time.  She reports that she takes pain medications before these procedures due to the discomfort that she experiences.  She was placed on Cymbalta and gabapentin at her last visit with this provider due to episodic sharp stabbing pain that she was experiencing in her chest.  She reports today that this pain has resolved.  She presents today with postprandial bloating, gas, diarrhea, and nausea.  She has had decreased p.o. intake recently due to these symptoms and changes in the taste of food.  She has not taken any medications for bloating, diarrhea, or nausea.  She is having some positional dizziness.  She is scheduled to have her Pleurx catheter drained today.  She denies any other issues of concern today.  Medications: I have reviewed the patient's current medications.  Allergies:  Allergies  Allergen Reactions  .  Shellfish Allergy Anaphylaxis and Other (See Comments)    Allergist said she was "highly allergic to shellfish"  . Sulfa Antibiotics Rash and Other (See Comments)    Looks like severe sunburn    Past Medical History:  Diagnosis Date  . Arthritis   . Breast cancer ([place]) 2010   Left  . Chronic sinusitis   . Family history of adverse reaction to  anesthesia    sister-nausea/vomiting  . History of chemotherapy 01/2009-03/2009  . Hypercholesteremia   . Hypothyroid    "inactive thyroid"  . Migraines   . RSD (reflex sympathetic dystrophy)     Past Surgical History:  Procedure Laterality Date  . BREAST RECONSTRUCTION    . EYE SURGERY Bilateral 7/18, 10/18  . IR PATIENT EVAL TECH 0-60 MINS  09/08/2018  . IR PERC PLEURAL DRAIN W/INDWELL CATH W/IMG GUIDE  08/05/2018  . KNEE ARTHROSCOPY Bilateral   . KNEE SURGERY Right    Two surgeries  . MASTECTOMY Bilateral 05/22/2009  . MASTECTOMY Bilateral 2011   restrictions on left arm-no labs/bloodpressure  . NASAL SINUS SURGERY    . PORTA CATH INSERTION  12/2008  . PORTA CATH REMOVAL  05/2009  . ROOT CANAL    . SHOULDER SURGERY Right   . TOTAL HIP ARTHROPLASTY Left 05/26/2017   Procedure: TOTAL HIP ARTHROPLASTY ANTERIOR APPROACH;  Surgeon: Frederik Pear, MD;  Location: [facility];  Service: Orthopedics;  Laterality: Left;    Family History  Problem Relation Age of Onset  . Hypertension Mother     Social History   Socioeconomic History  . Marital status: Married    Spouse name: Not on file  . Number of children: Not on file  . Years of education: Not on file  . Highest education level: Not on file  Occupational History  . Not on file  Social Needs  . Financial resource strain: Not on file  . Food insecurity    Worry: Not on file    Inability: Not on file  . Transportation needs    Medical: Not on file    Non-medical: Not on file  Tobacco Use  . Smoking status: Never Smoker  . Smokeless tobacco: Never Used  Substance and Sexual Activity  . Alcohol use: No  . Drug use: No  . Sexual activity: Yes    Birth control/protection: Post-menopausal  Lifestyle  . Physical activity    Days per week: Not on file    Minutes per session: Not on file  . Stress: Not on file  Relationships  . Social Herbalist on phone: Not on file    Gets together: Not on file    Attends  religious service: Not on file    Active member of club or organization: Not on file    Attends meetings of clubs or organizations: Not on file    Relationship status: Not on file  . Intimate partner violence    Fear of current or ex partner: Not on file    Emotionally abused: Not on file    Physically abused: Not on file    Forced sexual activity: Not on file  Other Topics Concern  . Not on file  Social History Narrative  . Not on file    Past Medical History, Surgical history, Social history, and Family history were reviewed and updated as appropriate.   Please see review of systems for further details on the patient's review from today.   Review of Systems:  Review  of Systems  Constitutional: Positive for appetite change. Negative for chills, diaphoresis, fever and unexpected weight change.  HENT: Negative for trouble swallowing.   Respiratory: Positive for shortness of breath. Negative for cough, choking, chest tightness and wheezing.   Cardiovascular: Positive for chest pain (With Pleurx catheter drainage). Negative for palpitations.  Gastrointestinal: Positive for abdominal distention, diarrhea and nausea. Negative for abdominal pain, constipation and vomiting.  Genitourinary: Negative for decreased urine volume.  Neurological: Negative for dizziness and headaches.    Objective:   Physical Exam:  BP (!) 131/95 (BP Location: Right Arm, Patient Position: Sitting)   Pulse (!) 119   Temp 97.8 F (36.6 C) (Oral)   Resp 20   Ht 5\' 4"  (1.626 m)   Wt 115 lb 4.8 oz (52.3 kg)   BMI 19.79 kg/m  ECOG: 1  Orthostatic Blood Pressure: Blood pressure:   sitting 117/87, standing 79/58 Pulse:   sitting 114, standing 121    Physical Exam Constitutional:      General: She is not in acute distress.    Appearance: She is not diaphoretic.  HENT:     Head: Normocephalic and atraumatic.  Eyes:     General: No scleral icterus.       Right eye: No discharge.        Left eye:  No discharge.  Cardiovascular:     Rate and Rhythm: Normal rate and regular rhythm.     Heart sounds: Normal heart sounds. No murmur. No friction rub. No gallop.   Pulmonary:     Effort: Pulmonary effort is normal. No respiratory distress.     Breath sounds: No stridor. No wheezing or rales.     Comments: Decreased auscultation and percussion over the lower one half of the left lung. Skin:    General: Skin is warm and dry.     Findings: No erythema or rash.  Neurological:     Mental Status: She is alert.     Coordination: Coordination normal.     Gait: Gait normal.  Psychiatric:        Mood and Affect: Mood normal.        Behavior: Behavior normal.        Thought Content: Thought content normal.        Judgment: Judgment normal.     Lab Review:     Component Value Date/Time   NA 138 10/05/2018 1235   NA 142 04/11/2014 0804   K 4.0 10/05/2018 1235   K 4.0 04/11/2014 0804   CL 102 10/05/2018 1235   CL 105 08/06/2012 1313   CO2 28 10/05/2018 1235   CO2 28 04/11/2014 0804   GLUCOSE 130 (H) 10/05/2018 1235   GLUCOSE 82 04/11/2014 0804   GLUCOSE 100 (H) 08/06/2012 1313   BUN 13 10/05/2018 1235   BUN 13.4 04/11/2014 0804   CREATININE 0.91 10/05/2018 1235   CREATININE 0.90 05/25/2018 0919   CREATININE 0.8 04/11/2014 0804   CALCIUM 9.5 10/05/2018 1235   CALCIUM 9.1 04/11/2014 0804   PROT 6.6 10/05/2018 1235   PROT 6.6 04/11/2014 0804   ALBUMIN 2.7 (L) 10/05/2018 1235   ALBUMIN 3.7 04/11/2014 0804   AST 36 10/05/2018 1235   AST 22 05/25/2018 0919   AST 16 04/11/2014 0804   ALT 16 10/05/2018 1235   ALT 13 05/25/2018 0919   ALT 16 04/11/2014 0804   ALKPHOS 88 10/05/2018 1235   ALKPHOS 91 04/11/2014 0804   BILITOT <0.2 (L) 10/05/2018 1235  BILITOT 0.7 05/25/2018 0919   BILITOT 0.48 04/11/2014 0804   GFRNONAA >60 10/05/2018 1235   GFRNONAA >60 05/25/2018 0919   GFRAA >60 10/05/2018 1235   GFRAA >60 05/25/2018 0919       Component Value Date/Time   WBC 2.4 (L)  10/05/2018 1235   WBC 1.8 (L) 08/05/2018 0820   RBC 3.01 (L) 10/05/2018 1235   HGB 10.6 (L) 10/05/2018 1235   HGB 13.0 04/11/2014 0804   HCT 32.4 (L) 10/05/2018 1235   HCT 39.5 04/11/2014 0804   PLT 273 10/05/2018 1235   PLT 359 04/11/2014 0804   MCV 107.6 (H) 10/05/2018 1235   MCV 93.4 04/11/2014 0804   MCH 35.2 (H) 10/05/2018 1235   MCHC 32.7 10/05/2018 1235   RDW 13.6 10/05/2018 1235   RDW 13.1 04/11/2014 0804   LYMPHSABS 0.5 (L) 10/05/2018 1235   LYMPHSABS 1.8 04/11/2014 0804   MONOABS 0.4 10/05/2018 1235   MONOABS 0.5 04/11/2014 0804   EOSABS 0.0 10/05/2018 1235   EOSABS 0.2 04/11/2014 0804   BASOSABS 0.1 10/05/2018 1235   BASOSABS 0.0 04/11/2014 0804   -------------------------------  Imaging from last 24 hours (if applicable):  Radiology interpretation: Dg Chest 2 View  Result Date: 09/08/2018 CLINICAL DATA:  Shortness of breath, pleural effusion. EXAM: CHEST - 2 VIEW COMPARISON:  Radiograph of August 05, 2018. FINDINGS: Stable cardiomegaly. No pneumothorax is noted. Right lung is clear. Large left pleural effusion is noted with probable underlying atelectasis or infiltrate. Pleural drainage catheter is again noted in the left hemithorax. Bony thorax is unremarkable. IMPRESSION: Stable position of left-sided pleural drainage catheter. Large left pleural effusion is noted which may be slightly increased compared to prior exam. Electronically Signed   By: Marijo Conception M.D.   On: 09/08/2018 10:25   Ir Patient Eval Tech 0-60 Mins  Result Date: 09/28/2018 Patient came in today with complaint of pain in left chest.  She was concerned the pleurx catheter had been dislodged or not functioning as it should.  Dr Pascal Lux ordered a chest xray to evaluate fluid.  I hooked up a drain bottle and successfully drained approximately 400 cc fluid from left pleurx.  The patient did start to become symptomatic at that point so we discontinued additional draining. The patient had some relief by  moving around post draining.  Dr Pascal Lux spoke to the patient regarding options.  Since the catheter was functioning well and was in a good position on the chest xray, It was decided to do no additional procedures at this time.  The patient understood and was advised to call us with any other concerns.    Cosigned by: Sandi Mariscal, MD at 09/08/2018 2:05 PM Electronically signed by Chipper Oman at 09/08/2018 11:43 AM Electronically signed by Sandi Mariscal, MD at 09/08/2018 2:05 PM

## 2018-10-05 NOTE — Telephone Encounter (Signed)
Received call from pt stating the Gabapentin Sandi Mealy PA placed her on to help with the [place] catheter pain is helping but not completely.  Pt also states her appetite has been decreased.  Pt states she she eats, she experiences increased bloating which causes her to vomit at times.  Pt also states she is not drinking fluids and feels like she is dehydrated.  Liza RN notified in [name] and okay with seeing pt today.  High priority message sent to scheduling.

## 2018-10-05 NOTE — Progress Notes (Signed)
Pt presents today reporting fatigue, loss of appetite, mild nausea, and mild gen body pain that has recently been improved with Cymbalta and Gabapentin but has not gone away.  Reports that when she does eat she is full quickly, bloated, and has lots of burping and gas following by loose stools.  Denies abd pain or changes in bowels or bladder otherwise.  Pt denies CP, SOB, DOE, or other resp symptoms or possible exposure to Covid-19, states she stays at home 'all the time'.  PA Lucianne Lei aware of pt's VS.  Pt refusing fluids today, states she will push oral hydration, small frequent meals, and the antiemetics & antidiarrheal and anti-gas medications as needed and will f/u with any further questions or concerns.

## 2018-10-08 ENCOUNTER — Telehealth: Payer: Self-pay | Admitting: *Deleted

## 2018-10-08 ENCOUNTER — Other Ambulatory Visit: Payer: Self-pay | Admitting: Medical

## 2018-10-08 ENCOUNTER — Emergency Department (HOSPITAL_COMMUNITY)
Admission: EM | Admit: 2018-10-08 | Discharge: 2018-10-08 | Disposition: A | Discharge status: Home / Self Care | Payer: 59 | Attending: Emergency Medicine | Admitting: Emergency Medicine

## 2018-10-08 ENCOUNTER — Other Ambulatory Visit: Payer: Self-pay

## 2018-10-08 ENCOUNTER — Emergency Department (HOSPITAL_COMMUNITY): Payer: 59

## 2018-10-08 ENCOUNTER — Encounter (HOSPITAL_COMMUNITY): Payer: Self-pay

## 2018-10-08 DIAGNOSIS — E039 Hypothyroidism, unspecified: Secondary | ICD-10-CM | POA: Insufficient documentation

## 2018-10-08 DIAGNOSIS — J90 Pleural effusion, not elsewhere classified: Secondary | ICD-10-CM

## 2018-10-08 DIAGNOSIS — T829XXA Unspecified complication of cardiac and vascular prosthetic device, implant and graft, initial encounter: Secondary | ICD-10-CM | POA: Diagnosis not present

## 2018-10-08 DIAGNOSIS — Z79899 Other long term (current) drug therapy: Secondary | ICD-10-CM | POA: Diagnosis not present

## 2018-10-08 DIAGNOSIS — R52 Pain, unspecified: Secondary | ICD-10-CM

## 2018-10-08 DIAGNOSIS — Z20828 Contact with and (suspected) exposure to other viral communicable diseases: Secondary | ICD-10-CM | POA: Insufficient documentation

## 2018-10-08 DIAGNOSIS — C50912 Malignant neoplasm of unspecified site of left female breast: Secondary | ICD-10-CM | POA: Diagnosis not present

## 2018-10-08 DIAGNOSIS — Y712 Prosthetic and other implants, materials and accessory cardiovascular devices associated with adverse incidents: Secondary | ICD-10-CM | POA: Diagnosis not present

## 2018-10-08 LAB — SARS CORONAVIRUS 2 BY RT PCR (HOSPITAL ORDER, PERFORMED IN ~~LOC~~ HOSPITAL LAB): SARS Coronavirus 2: NEGATIVE

## 2018-10-08 NOTE — ED Provider Notes (Signed)
[facility] DEPT Provider Note   CSN: [id number] Arrival date & time: 10/08/18  1709    History   Chief Complaint Chief Complaint  Patient presents with  . Pleural Effusion    HPI Elizabeth [name] is a 63 y.o. female.     HPI  She has a left-sided chest catheter, to drain a pleural effusion related to cancer.  He noticed that on Tuesday she had decreased output in the catheter.  She has ongoing bloody discharge from the Pleurx catheter.  Today the home health nurse was there and could not get anything out of the catheter.  At that time the patient was having severe shortness of breath.  Since that time his shortness of breath has improved.  She denies fever, chills, nausea, vomiting, weakness or dizziness.  There are no other known modifying factors.  Past Medical History:  Diagnosis Date  . Arthritis   . Breast cancer ([place]) 2010   Left  . Chronic sinusitis   . Family history of adverse reaction to anesthesia    sister-nausea/vomiting  . History of chemotherapy 01/2009-03/2009  . Hypercholesteremia   . Hypothyroid    "inactive thyroid"  . Migraines   . RSD (reflex sympathetic dystrophy)     Patient Active Problem List   Diagnosis Date Noted  . Bone metastasis ([place]) 12/22/2017  . Primary osteoarthritis of left hip 05/26/2017  . Osteoarthritis of left hip 05/23/2017  . Breast cancer of lower-outer quadrant of left female breast ([place]) 08/06/2012    Past Surgical History:  Procedure Laterality Date  . BREAST RECONSTRUCTION    . EYE SURGERY Bilateral 7/18, 10/18  . IR PATIENT EVAL TECH 0-60 MINS  09/08/2018  . IR PERC PLEURAL DRAIN W/INDWELL CATH W/IMG GUIDE  08/05/2018  . KNEE ARTHROSCOPY Bilateral   . KNEE SURGERY Right    Two surgeries  . MASTECTOMY Bilateral 05/22/2009  . MASTECTOMY Bilateral 2011   restrictions on left arm-no labs/bloodpressure  . NASAL SINUS SURGERY    . PORTA CATH INSERTION  12/2008  . PORTA CATH REMOVAL  05/2009   . ROOT CANAL    . SHOULDER SURGERY Right   . TOTAL HIP ARTHROPLASTY Left 05/26/2017   Procedure: TOTAL HIP ARTHROPLASTY ANTERIOR APPROACH;  Surgeon: Frederik Pear, MD;  Location: [facility];  Service: Orthopedics;  Laterality: Left;     OB History   No obstetric history on file.      Home Medications    Prior to Admission medications   Medication Sig Start Date End Date Taking? Authorizing Provider  Biotin 10000 MCG TABS Take 10,000 mcg by mouth daily.     [provider]  Cholecalciferol (VITAMIN D PO) Take 5,000 Units by mouth daily.     [provider]  DULoxetine (CYMBALTA) 30 MG capsule Take 1 capsule (30 mg total) by mouth 2 (two) times daily. 09/16/18   Tanner, Lyndon Code., PA-C  gabapentin (NEURONTIN) 300 MG capsule Take 1 capsule (300 mg total) by mouth at bedtime. 09/16/18   Tanner, Lyndon Code., PA-C  letrozole [facility]) 2.5 MG tablet Take 1 tablet (2.5 mg total) by mouth daily. 01/12/18   Nicholas Lose, MD  levothyroxine (SYNTHROID, LEVOTHROID) 25 MCG tablet Take 25 mcg by mouth daily before breakfast.     [provider]  loperamide (IMODIUM) 2 MG capsule 1 to 2 PO QID prn diarrhea 10/05/18   Harle Stanford., PA-C  magic mouthwash w/lidocaine SOLN Take 5 mLs by mouth 3 (three)  times daily as needed for mouth pain. 05/25/18   Nicholas Lose, MD  Multiple Vitamin (MULTIVITAMIN) tablet Take 1 tablet by mouth daily.    [provider]  Omega-3 Fatty Acids (SUPER OMEGA 3 EPA/DHA PO) Take 1 capsule by mouth daily.    [provider]  omeprazole (PRILOSEC) 20 MG capsule Take 1 capsule (20 mg total) by mouth daily. 10/05/18   Tanner, Lyndon Code., PA-C  oxyCODONE-acetaminophen (PERCOCET) 10-325 MG tablet Take 1 tablet by mouth every 8 (eight) hours as needed for pain. 08/17/18   Nicholas Lose, MD  palbociclib (IBRANCE) 100 MG capsule Take 1 capsule (100 mg total) by mouth daily with breakfast. Take whole with food. Take for 21 days on, 7 days off, repeat every 28 days.  05/25/18   Nicholas Lose, MD  prednisoLONE acetate (PRED FORTE) 1 % ophthalmic suspension Place 1 drop into both eyes daily.    [provider]  prochlorperazine (COMPAZINE) 5 MG tablet Take 1 tablet (5 mg total) by mouth every 6 (six) hours as needed for nausea or vomiting. 10/05/18   Tanner, Lyndon Code., PA-C  simethicone (GAS-X) 80 MG chewable tablet Chew 1 tablet (80 mg total) by mouth every 6 (six) hours as needed for flatulence. 10/05/18   Tanner, Lyndon Code., PA-C  traMADol (ULTRAM) 50 MG tablet Take 1 tablet (50 mg total) by mouth every 6 (six) hours as needed. 09/09/18   Nicholas Lose, MD    Family History Family History  Problem Relation Age of Onset  . Hypertension Mother     Social History Social History   Tobacco Use  . Smoking status: Never Smoker  . Smokeless tobacco: Never Used  Substance Use Topics  . Alcohol use: No  . Drug use: No     Allergies   Shellfish allergy and Sulfa antibiotics   Review of Systems Review of Systems  All other systems reviewed and are negative.    Physical Exam Updated Vital Signs BP 118/77   Pulse 99   Temp 98.7 F (37.1 C) (Oral)   Resp 16   Ht 5\' 3"  (1.6 m)   Wt 52.2 kg   SpO2 96%   BMI 20.37 kg/m   Physical Exam Vitals signs and nursing note reviewed.  Constitutional:      General: She is not in acute distress.    Appearance: She is well-developed. She is not ill-appearing, toxic-appearing or diaphoretic.  HENT:     Head: Normocephalic and atraumatic.     Right Ear: External ear normal.     Left Ear: External ear normal.  Eyes:     Conjunctiva/sclera: Conjunctivae normal.     Pupils: Pupils are equal, round, and reactive to light.  Neck:     Musculoskeletal: Normal range of motion and neck supple.     Trachea: Phonation normal.  Cardiovascular:     Rate and Rhythm: Normal rate and regular rhythm.     Heart sounds: Normal heart sounds.  Pulmonary:     Effort: Pulmonary effort is normal.     Comments:  Decreased breath sounds left mid to lower lung.  There is no increased work of breathing. Abdominal:     Palpations: Abdomen is soft.     Tenderness: There is no abdominal tenderness.  Musculoskeletal: Normal range of motion.  Skin:    General: Skin is warm and dry.  Neurological:     Mental Status: She is alert and oriented to person, place, and time.  Cranial Nerves: No cranial nerve deficit.     Sensory: No sensory deficit.     Motor: No abnormal muscle tone.     Coordination: Coordination normal.  Psychiatric:        Behavior: Behavior normal.        Thought Content: Thought content normal.        Judgment: Judgment normal.      ED Treatments / Results  Labs (all labs ordered are listed, but only abnormal results are displayed) Labs Reviewed  SARS CORONAVIRUS 2 (HOSPITAL ORDER, [facility])    EKG None  Radiology Dg Chest 2 View  Result Date: 10/08/2018 CLINICAL DATA:  Pain EXAM: CHEST - 2 VIEW COMPARISON:  Chest x-ray dated Sep 08, 2018 FINDINGS: There is a large left-sided pleural effusion in place. There is a left-sided PleurX drainage catheter in place. There are coarsened lung markings involving the lung apex on the left. This appears stable from prior study. An underlying pneumothorax is difficult to entirely exclude. The right lung field is essentially clear. There is collapse of much of the left lower lobe. IMPRESSION: Large left-sided pleural effusion. There is a PleurX drainage catheter in place with stable positioning from prior study. Electronically Signed   By: Constance Holster M.D.   On: 10/08/2018 19:02    Procedures Procedures (including critical care time)  Medications Ordered in ED Medications - No data to display   Initial Impression / Assessment and Plan / ED Course  I have reviewed the triage vital signs and the nursing notes.  Pertinent labs & imaging results that were available during my care of the patient were  reviewed by me and considered in my medical decision making (see chart for details).         Patient Vitals for the past 24 hrs:  BP Temp Temp src Pulse Resp SpO2 Height Weight  10/08/18 2000 118/77 - - 99 16 96 % - -  10/08/18 1915 120/77 - - 92 16 95 % - -  10/08/18 1856 94/67 - - 96 16 100 % - -  10/08/18 1719 - - - - - - 5\' 3"  (1.6 m) 52.2 kg  10/08/18 1718 106/77 98.7 F (37.1 C) Oral (!) 113 14 100 % - -    8:42 PM Reevaluation with update and discussion. After initial assessment and treatment, an updated evaluation reveals no change in clinical status, findings discussed with the patient all questions were answered.Daleen Bo   Medical Decision Making: Shortness of breath and patient concern for catheter malfunction, left Pleurx catheter.  Chest x-ray shows persistent effusion as compared to 1 month ago.  It is unclear if she loculated effusion, versus other underlying pathology.  She is hemodynamically and respiratory status stable.  There is no indication for hospitalization or acute intervention at this time.  I have placed an order for interventional radiology to assess and treat the catheter and consider replacement if needed.  This can be done as an outpatient.  A COVID test was sent, so she can undergo procedure if negative.  Is pending at the time of discharge.  Rosary Filosa Gut was evaluated in Emergency Department on 10/08/2018 for the symptoms described in the history of present illness. She was evaluated in the context of the global COVID-19 pandemic, which necessitated consideration that the patient might be at risk for infection with the SARS-CoV-2 virus that causes COVID-19. Institutional protocols and algorithms that pertain to the evaluation of  patients at risk for COVID-19 are in a state of rapid change based on information released by regulatory bodies including the CDC and federal and state organizations. These policies and algorithms were followed during the  patient's care in the ED.   CRITICAL CARE-no Performed by: Daleen Bo  Nursing Notes Reviewed/ Care Coordinated Applicable Imaging Reviewed Interpretation of Laboratory Data incorporated into ED treatment  The patient appears reasonably screened and/or stabilized for discharge and I doubt any other medical condition or other [facility] requiring further screening, evaluation, or treatment in the ED at this time prior to discharge.  Plan: Home Medications-continue usual; Home Treatments-rest; return here if the recommended treatment, does not improve the symptoms; Recommended follow up-IR intervention tomorrow hopefully, with oncology follow-up as needed.  Return here, for problems.   Final Clinical Impressions(s) / ED Diagnoses   Final diagnoses:  Pleural effusion, left    ED Discharge Orders         Ordered    IR [facility] PLEURAL DRAIN W/INDWELL CATH W/IMG GUIDE     10/08/18 2009           Daleen Bo, MD 10/08/18 2044

## 2018-10-08 NOTE — ED Notes (Signed)
Per cancer, states patient has a history of breast cancer with METZ-states she had a drain placed for pleural effusion-states [facility] RN states drain not draining, causing pain-states office closes at 1630 so they are sending her to ED for evaluation

## 2018-10-08 NOTE — ED Triage Notes (Signed)
Per cancer center, states patient has a history of breast cancer with METZ-states she had a drain placed for pleural effusion-states [facility] RN states drain not draining, causing pain-states office closes at 1630 so they are sending her to ED for evaluation  2 days, no drainage. Saw home health nurse at 1530, who wanted to assess. They were unable to get anything out . Pt states she feels very full.  Oxycodone at 1400.

## 2018-10-08 NOTE — Telephone Encounter (Signed)
Received call from Mitzi Hansen, RN with [facility] stating she attempted to drain pt PlurX catheter with no output.  States the "line is dry" and pt is symptomaitc with diminished lung sounds, and increased shortness of breath.  Also states that on Monday pt catheter drained with very minimal output and did not call the office considering she was asymptomatic with no shortness of breath. Davy Pique, RN stated she has instructed pt to go to the [facility] Emergency Department for further evaluation.  This RN called and spoke with Erline Levine, charge RN in the ED to give report and let her be aware that pt is on her way.

## 2018-10-08 NOTE — Discharge Instructions (Addendum)
We have ordered an assessment of your Pleurx catheter, by interventional radiology, for tomorrow.  Call them in the morning, by contacting the main hospital number, and [phone].  They can connect you to the interventional radiology department.  They should be able to schedule a time to come in for the catheter assessment.  If you are unable to get scheduled when you call, ask your oncologist, Dr. Lindi Adie, to help you with that.

## 2018-10-09 ENCOUNTER — Ambulatory Visit (HOSPITAL_COMMUNITY)
Admission: RE | Admit: 2018-10-09 | Discharge: 2018-10-09 | Disposition: A | Discharge status: Home / Self Care | Payer: 59 | Source: Ambulatory Visit | Attending: Hematology and Oncology | Admitting: Hematology and Oncology

## 2018-10-09 ENCOUNTER — Encounter (HOSPITAL_COMMUNITY): Payer: Self-pay | Admitting: Physician Assistant

## 2018-10-09 ENCOUNTER — Telehealth: Payer: Self-pay

## 2018-10-09 ENCOUNTER — Other Ambulatory Visit: Payer: Self-pay | Admitting: Physician Assistant

## 2018-10-09 ENCOUNTER — Other Ambulatory Visit (HOSPITAL_COMMUNITY): Payer: Self-pay | Admitting: Hematology and Oncology

## 2018-10-09 ENCOUNTER — Other Ambulatory Visit (HOSPITAL_COMMUNITY)
Admission: RE | Admit: 2018-10-09 | Discharge: 2018-10-09 | Disposition: A | Discharge status: Home / Self Care | Payer: 59 | Source: Ambulatory Visit | Attending: Physician Assistant | Admitting: Physician Assistant

## 2018-10-09 DIAGNOSIS — J90 Pleural effusion, not elsewhere classified: Secondary | ICD-10-CM | POA: Insufficient documentation

## 2018-10-09 DIAGNOSIS — Z1159 Encounter for screening for other viral diseases: Secondary | ICD-10-CM | POA: Diagnosis not present

## 2018-10-09 DIAGNOSIS — C50919 Malignant neoplasm of unspecified site of unspecified female breast: Secondary | ICD-10-CM

## 2018-10-09 DIAGNOSIS — J910 Malignant pleural effusion: Secondary | ICD-10-CM

## 2018-10-09 DIAGNOSIS — C50512 Malignant neoplasm of lower-outer quadrant of left female breast: Secondary | ICD-10-CM

## 2018-10-09 HISTORY — PX: IR INSTILL VIA CHEST TUBE AGENT FOR FIBRINOLYSIS INI DAY: IMG5400

## 2018-10-09 LAB — SARS CORONAVIRUS 2 (TAT 6-24 HRS): SARS Coronavirus 2: NEGATIVE

## 2018-10-09 MED ORDER — ALTEPLASE 2 MG IJ SOLR
INTRAMUSCULAR | Status: AC
  Administered 2018-10-09: 5 mg
  Filled 2018-10-09: qty 6

## 2018-10-09 NOTE — Procedures (Signed)
Patient presented to IR today for tPA dwell due to clogged left Pleurx for approximately 1 week. Patient denies dyspnea or cough today but does endorse some left sided chest pain which she states is typical for her and is unchanged.   6 mg tPA in 30 cc NS instilled in Pleurx, tubing clamped x 2 hours. Upon unclamping of catheter tubing <1 cc of bloody pleural fluid was removed. Unable to remove any further fluid.  Patient discussed with Dr. Kathlene Cote today with plan to remove Pleurx on Monday 6/29 at 9 am and perform therapeutic left sided thoracentesis. Recommend monitoring for return of symptoms and if pleural effusion again rapidly accumulates as demonstrated by imaging we will discuss replacing Pleurx.   Patient to present to [address] for COVID testing today at 3:15 pm in anticipation of thoracentesis. Plan was discussed with patient by myself and she states understanding and agreement. She is aware to present to the ED if she develops dyspnea, worsening chest pain, severe cough or change in mental status prior to her appointment with IR on Monday.  Please call IR with questions or concerns.   Candiss Norse, PA-C

## 2018-10-09 NOTE — Telephone Encounter (Signed)
RN spoke with patient, patient with severe SHOB and feeling of "fullness" in thoracic area.  Pt with pleurx drain, home health RN was unable to get output on 10/08/2018.    RN spoke with IR @ [facility], IR will be working patient in today for TPA infusion.  Pt aware.

## 2018-10-12 ENCOUNTER — Other Ambulatory Visit: Payer: Self-pay

## 2018-10-12 ENCOUNTER — Other Ambulatory Visit: Payer: Self-pay | Admitting: Physician Assistant

## 2018-10-12 ENCOUNTER — Ambulatory Visit (HOSPITAL_COMMUNITY)
Admission: RE | Admit: 2018-10-12 | Discharge: 2018-10-12 | Disposition: A | Discharge status: Home / Self Care | Payer: 59 | Source: Ambulatory Visit | Attending: Physician Assistant | Admitting: Physician Assistant

## 2018-10-12 ENCOUNTER — Encounter (HOSPITAL_COMMUNITY): Payer: Self-pay | Admitting: Student

## 2018-10-12 DIAGNOSIS — J910 Malignant pleural effusion: Secondary | ICD-10-CM

## 2018-10-12 DIAGNOSIS — J90 Pleural effusion, not elsewhere classified: Secondary | ICD-10-CM | POA: Diagnosis present

## 2018-10-12 HISTORY — PX: IR RADIOLOGIST EVAL & MGMT: IMG5224

## 2018-10-12 MED ORDER — CHLORHEXIDINE GLUCONATE 4 % EX LIQD
CUTANEOUS | Status: AC
  Filled 2018-10-12: qty 15

## 2018-10-12 MED ORDER — LIDOCAINE HCL 1 % IJ SOLN
INTRAMUSCULAR | Status: AC
  Filled 2018-10-12: qty 20

## 2018-10-12 NOTE — Progress Notes (Signed)
Patient presented to IR again today for ongoing evaluation and management of her non-functioning Pleurx catheter.  Patient underwent tPA instillation on Friday without immediate results.  She left the PleurX capped over the weekend with a plan to return today for thoracentesis and Pleurx removal. She did have breakfast this AM.   Upon assessment today, patient has a small amount of blood-tinged fluid in the PleurX tubing.  I was able to gently milk the tube and draw more fluid into the catheter.  Limited US chest demonstrates a large amount of fluid within the chest.  Patient was connected to a bottle with easy flow of fluid from the catheter. She was able to tolerate withdrawal of 500 mL fluid before having to stop due to pain and discomfort in her chest.  She states this is typical for her.   Patient discharged with functioning catheter today.  Instructed her to use the tube as planned this week as she appears to still have a large amount of fluid with rapid reaccumulation.  She remains symptomatic with dyspnea on exertion and with prolonged conversation.  Discussed options moving forward should the tube clog again.  Patient will likely continue to need Pleurx and could attempt tPA vs. Exchange if needed.   Brynda Greathouse, MS RD PA-C 10:30 AM

## 2018-10-13 NOTE — Telephone Encounter (Signed)
Dr. Lindi Adie, advise refills.

## 2018-10-15 NOTE — Assessment & Plan Note (Signed)
Left breast cancer invasive ductal carcinoma 2.5 cm in size grade 1, ER 99%, PR 41%, Ki-67 11%, HER-2 negative T2 N0 M0 stage II a status post neoadjuvant chemotherapy followed by surgery February 2011and wason antiestrogen therapy with Femara from January 2011-July 2017  New onset mid back pain: MRI revealedT11 severe pathologic fracture, abnormal signal T10, T11 and T12, moderate canal stenosis at T11  12/02/2017: PET CT scan hypermetabolic lymphadenopathy in the thoracic and upper abdominal lymph nodes, spine and rib metastases, intramuscular metastases the left pectoralis muscle, T11 vertebral fracture pathologic  Biopsy of the T11 vertebral body ER 90%, PR 0%, Ki-67 10%, HER-2 -1+, foundation 1, PDL 1  Pathologic fracture T11: She met with orthopedics regarding kyphoplasty and they determined that there is no surgical options available for her. ------------------------------------------------------------------- Treatment : 1. Palliative radiation to T11 vertebral body and the pectoralis muscle: 12/20/17-12/26/17 2. current treatment: Ibrance with letrozole along with Delton See or Zometa Patient could not tolerate Faslodex because of intense bone pain as well as hypotension.  Ibrance toxicities: 1.HCW:CBJSEGB from today continue with current dosage of 100 mg Ibrance Zometa every 3 months.  04/09/2018 bone scan: Foci of increased tracer uptake in left ribs and lower thoracic spine consistent with bone metastases. 04/09/2018: CT CAP: Reduced size of the several lymph nodes in the chest, reduced size of the abdominal lymph nodes. No new bone lesions, large left pleural effusion, left suprahilar airspace opacity 3.5 x 3 cm  Severe shortness of breath with recurrent pleural effusions: Pleurx catheter drainage.  Lately she has had some issues with that and the type to be reset.  CT CAP and bone scan 10/19/2018:

## 2018-10-19 ENCOUNTER — Other Ambulatory Visit: Payer: Self-pay

## 2018-10-19 ENCOUNTER — Inpatient Hospital Stay: Payer: 59 | Attending: Hematology and Oncology

## 2018-10-19 ENCOUNTER — Encounter (HOSPITAL_COMMUNITY)
Admission: RE | Admit: 2018-10-19 | Discharge: 2018-10-19 | Disposition: A | Discharge status: Home / Self Care | Payer: 59 | Source: Ambulatory Visit | Attending: Hematology and Oncology | Admitting: Hematology and Oncology

## 2018-10-19 ENCOUNTER — Encounter (HOSPITAL_COMMUNITY): Payer: Self-pay

## 2018-10-19 ENCOUNTER — Ambulatory Visit (HOSPITAL_COMMUNITY): Payer: 59

## 2018-10-19 ENCOUNTER — Ambulatory Visit (HOSPITAL_COMMUNITY)
Admission: RE | Admit: 2018-10-19 | Discharge: 2018-10-19 | Disposition: A | Discharge status: Home / Self Care | Payer: 59 | Source: Ambulatory Visit | Attending: Hematology and Oncology | Admitting: Hematology and Oncology

## 2018-10-19 DIAGNOSIS — Z170 Estrogen receptor positive status [ER+]: Secondary | ICD-10-CM | POA: Insufficient documentation

## 2018-10-19 DIAGNOSIS — C50512 Malignant neoplasm of lower-outer quadrant of left female breast: Secondary | ICD-10-CM | POA: Diagnosis not present

## 2018-10-19 DIAGNOSIS — Z5111 Encounter for antineoplastic chemotherapy: Secondary | ICD-10-CM | POA: Diagnosis not present

## 2018-10-19 DIAGNOSIS — J90 Pleural effusion, not elsewhere classified: Secondary | ICD-10-CM | POA: Diagnosis not present

## 2018-10-19 DIAGNOSIS — C7951 Secondary malignant neoplasm of bone: Secondary | ICD-10-CM | POA: Insufficient documentation

## 2018-10-19 LAB — CBC WITH DIFFERENTIAL (CANCER CENTER ONLY)
Abs Immature Granulocytes: 0.03 10*3/uL (ref 0.00–0.07)
Basophils Absolute: 0.1 10*3/uL (ref 0.0–0.1)
Basophils Relative: 1 %
Eosinophils Absolute: 0 10*3/uL (ref 0.0–0.5)
Eosinophils Relative: 1 %
HCT: 30.8 % — ABNORMAL LOW (ref 36.0–46.0)
Hemoglobin: 10.2 g/dL — ABNORMAL LOW (ref 12.0–15.0)
Immature Granulocytes: 1 %
Lymphocytes Relative: 9 %
Lymphs Abs: 0.4 10*3/uL — ABNORMAL LOW (ref 0.7–4.0)
MCH: 34.6 pg — ABNORMAL HIGH (ref 26.0–34.0)
MCHC: 33.1 g/dL (ref 30.0–36.0)
MCV: 104.4 fL — ABNORMAL HIGH (ref 80.0–100.0)
Monocytes Absolute: 0.3 10*3/uL (ref 0.1–1.0)
Monocytes Relative: 8 %
Neutro Abs: 3 10*3/uL (ref 1.7–7.7)
Neutrophils Relative %: 80 %
Platelet Count: 411 10*3/uL — ABNORMAL HIGH (ref 150–400)
RBC: 2.95 MIL/uL — ABNORMAL LOW (ref 3.87–5.11)
RDW: 13.6 % (ref 11.5–15.5)
WBC Count: 3.7 10*3/uL — ABNORMAL LOW (ref 4.0–10.5)
nRBC: 0 % (ref 0.0–0.2)

## 2018-10-19 LAB — CMP (CANCER CENTER ONLY)
ALT: 11 U/L (ref 0–44)
AST: 23 U/L (ref 15–41)
Albumin: 2.4 g/dL — ABNORMAL LOW (ref 3.5–5.0)
Alkaline Phosphatase: 89 U/L (ref 38–126)
Anion gap: 11 (ref 5–15)
BUN: 10 mg/dL (ref 8–23)
CO2: 27 mmol/L (ref 22–32)
Calcium: 9.2 mg/dL (ref 8.9–10.3)
Chloride: 100 mmol/L (ref 98–111)
Creatinine: 1.05 mg/dL — ABNORMAL HIGH (ref 0.44–1.00)
GFR, Est AFR Am: >60 mL/min (ref >60)
GFR, Estimated: 56 mL/min — ABNORMAL LOW (ref >60)
Glucose, Bld: 123 mg/dL — ABNORMAL HIGH (ref 70–99)
Potassium: 4 mmol/L (ref 3.5–5.1)
Sodium: 138 mmol/L (ref 135–145)
Total Bilirubin: 0.4 mg/dL (ref 0.3–1.2)
Total Protein: 7.3 g/dL (ref 6.5–8.1)

## 2018-10-19 MED ORDER — IOHEXOL 300 MG/ML  SOLN
100.0000 mL | Freq: Once | INTRAMUSCULAR | Status: AC | PRN
  Administered 2018-10-19: 100 mL via INTRAVENOUS

## 2018-10-19 MED ORDER — SODIUM CHLORIDE (PF) 0.9 % IJ SOLN
INTRAMUSCULAR | Status: AC
  Filled 2018-10-19: qty 50

## 2018-10-19 MED ORDER — TECHNETIUM TC 99M MEDRONATE IV KIT
19.5000 | PACK | Freq: Once | INTRAVENOUS | Status: AC | PRN
  Administered 2018-10-19: 19.5 via INTRAVENOUS

## 2018-10-20 NOTE — Progress Notes (Signed)
Patient Care Team: Redmon, Barth Kirks, PA-C as PCP - General (Nurse Practitioner)  DIAGNOSIS:    ICD-10-CM   1. Malignant neoplasm of lower-outer quadrant of left breast of female, estrogen receptor positive ([place])  C50.512    Z17.0     SUMMARY OF ONCOLOGIC HISTORY: Oncology History  Breast cancer of lower-outer quadrant of left female breast ([place])  12/13/2008 Initial Diagnosis   Left breast biopsy: Invasive ductal carcinoma ER 90% percent, PR 41%, Ki-67 11%, HER-2 negative ratio 1, Oncotype DX score 23, ROR15%   01/27/2009 - 03/10/2009 Neo-Adjuvant Chemotherapy   Neoadjuvant FEC 4; participant in the "bed sheets" study.   05/12/2009 -  Anti-estrogen oral therapy   Femara 2.5 mg daily   05/22/2009 Surgery   Bilateral mastectomy stage IIB T2 N0 M0 IDC left breast grade 1, 2.5 cm, ER 99%, PR 41%, Ki-67 11%, HER-2 negative   12/10/2009 Surgery   Breast reconstruction surgery   11/14/2017 Relapse/Recurrence   Mid back pain: MRI revealed T11 severe pathologic fracture, abnormal signal T10, T11 and T12, moderate canal stenosis at T11   12/03/2017 PET scan   Hypermetabolic metastatic breast cancer involving thoracic and upper abdominal lymph nodes, spine and ribs. There may be hypermetabolic adenopathy in the left neck; Hypermetabolic lymph node versus intramuscular metastasis involving the medial left pectoralis musculature Pathologic T11 fracture     12/11/2017 Procedure   Biopsy of T11 vertebral body lytic lesion: Metastatic breast adenocarcinoma ER positive, PR negative   12/19/2017 - 12/26/2017 Radiation Therapy   Palliative XRT to T 11   01/12/2018 -  Anti-estrogen oral therapy   Patient could not tolerate Faslodex because of severe hypotension and severe pain in the legs after injections.  Ibrance with letrozole starting 01/12/2018     CHIEF COMPLIANT: Follow-up to discuss recent scans  INTERVAL HISTORY: Elizabeth [name] is a 63 y.o. with above-mentioned history of metastatic breast  cancer on Ibrance with letrozole and Zometa who has had recurrent pleural effusions. She had a PluerX catheter placed and it is drained twice weekly by a home health nurse. She was seen in Symptom Management on 09/16/18 for bilateral chest wall pain and started on Cymbalta. She was seen on 10/12/18 by interventional radiology and had her catheter fixed. CT CAP on 10/19/18 showed large left pleural effusions and possible pericardial metastases, possible lymphangitic spread in the right upper and middle lobe, new right hilar nodal metastasis, mew left para-aortic nodal metastasis, new left adrenal metastases, and multifocal osseous metastases largely unchanged. Bone scan on 10/19/18 showed progressive disease with several new osseous metastases. She presents to the clinic today to discuss recent scans.   REVIEW OF SYSTEMS:   Constitutional: Denies fevers, chills or abnormal weight loss Eyes: Denies blurriness of vision Ears, nose, mouth, throat, and face: Denies mucositis or sore throat Respiratory: Denies cough, dyspnea or wheezes Cardiovascular: Denies palpitation, chest discomfort Gastrointestinal: Denies nausea, heartburn or change in bowel habits Skin: Denies abnormal skin rashes Lymphatics: Denies new lymphadenopathy or easy bruising Neurological: Denies numbness, tingling or new weaknesses Behavioral/Psych: Mood is stable, no new changes  Extremities: No lower extremity edema Breast: denies any pain or lumps or nodules in either breasts All other systems were reviewed with the patient and are negative.  I have reviewed the past medical history, past surgical history, social history and family history with the patient and they are unchanged from previous note.  ALLERGIES:  is allergic to shellfish allergy and sulfa antibiotics.  MEDICATIONS:  Current Outpatient  Medications  Medication Sig Dispense Refill  . Biotin 10000 MCG TABS Take 10,000 mcg by mouth daily.     . Cholecalciferol (VITAMIN D  PO) Take 5,000 Units by mouth daily.     . DULoxetine (CYMBALTA) 30 MG capsule TAKE 1 CAPSULE (30 MG TOTAL) BY MOUTH 2 (TWO) TIMES DAILY. 180 capsule 2  . gabapentin (NEURONTIN) 300 MG capsule TAKE 1 CAPSULE BY MOUTH EVERYDAY AT BEDTIME 90 capsule 2  . letrozole (FEMARA) 2.5 MG tablet Take 1 tablet (2.5 mg total) by mouth daily. 90 tablet 3  . levothyroxine (SYNTHROID, LEVOTHROID) 25 MCG tablet Take 25 mcg by mouth daily before breakfast.     . loperamide (IMODIUM) 2 MG capsule 1 to 2 PO QID prn diarrhea 30 capsule 22  . magic mouthwash w/lidocaine SOLN Take 5 mLs by mouth 3 (three) times daily as needed for mouth pain. 100 mL 0  . Multiple Vitamin (MULTIVITAMIN) tablet Take 1 tablet by mouth daily.    . Omega-3 Fatty Acids (SUPER OMEGA 3 EPA/DHA PO) Take 1 capsule by mouth daily.    Marland Kitchen omeprazole (PRILOSEC) 20 MG capsule Take 1 capsule (20 mg total) by mouth daily. 30 capsule 11  . oxyCODONE-acetaminophen (PERCOCET) 10-325 MG tablet Take 1 tablet by mouth every 8 (eight) hours as needed for pain. 90 tablet 0  . palbociclib (IBRANCE) 100 MG capsule Take 1 capsule (100 mg total) by mouth daily with breakfast. Take whole with food. Take for 21 days on, 7 days off, repeat every 28 days. 21 capsule 6  . prednisoLONE acetate (PRED FORTE) 1 % ophthalmic suspension Place 1 drop into both eyes daily.    . prochlorperazine (COMPAZINE) 5 MG tablet Take 1 tablet (5 mg total) by mouth every 6 (six) hours as needed for nausea or vomiting. 45 tablet 2  . simethicone (GAS-X) 80 MG chewable tablet Chew 1 tablet (80 mg total) by mouth every 6 (six) hours as needed for flatulence. 120 tablet 2  . traMADol (ULTRAM) 50 MG tablet Take 1 tablet (50 mg total) by mouth every 6 (six) hours as needed. 30 tablet 0   No current facility-administered medications for this visit.     PHYSICAL EXAMINATION: ECOG PERFORMANCE STATUS: 2 - Symptomatic, <50% confined to bed  Vitals:   10/21/18 1519  BP: 116/72  Pulse: (!) 116   Resp: 17  Temp: 98.4 F (36.9 C)  SpO2: 99%   Filed Weights   10/21/18 1519  Weight: 113 lb 6.4 oz (51.4 kg)    GENERAL: alert, no distress and comfortable SKIN: skin color, texture, turgor are normal, no rashes or significant lesions EYES: normal, Conjunctiva are pink and non-injected, sclera clear OROPHARYNX: no exudate, no erythema and lips, buccal mucosa, and tongue normal  NECK: supple, thyroid normal size, non-tender, without nodularity LYMPH: no palpable lymphadenopathy in the cervical, axillary or inguinal LUNGS: clear to auscultation and percussion with normal breathing effort HEART: regular rate & rhythm and no murmurs and no lower extremity edema ABDOMEN: abdomen soft, non-tender and normal bowel sounds MUSCULOSKELETAL: no cyanosis of digits and no clubbing  NEURO: alert & oriented x 3 with fluent speech, no focal motor/sensory deficits EXTREMITIES: No lower extremity edema  LABORATORY DATA:  I have reviewed the data as listed CMP Latest Ref Rng & Units 10/19/2018 10/05/2018 08/05/2018  Glucose 70 - 99 mg/dL 123(H) 130(H) 100(H)  BUN 8 - 23 mg/dL '10 13 16  ' Creatinine 0.44 - 1.00 mg/dL 1.05(H) 0.91 0.91  Sodium 135 -  145 mmol/L 138 138 139  Potassium 3.5 - 5.1 mmol/L 4.0 4.0 3.7  Chloride 98 - 111 mmol/L 100 102 105  CO2 22 - 32 mmol/L '27 28 24  ' Calcium 8.9 - 10.3 mg/dL 9.2 9.5 9.3  Total Protein 6.5 - 8.1 g/dL 7.3 6.6 -  Total Bilirubin 0.3 - 1.2 mg/dL 0.4 <0.2(L) -  Alkaline Phos 38 - 126 U/L 89 88 -  AST 15 - 41 U/L 23 36 -  ALT 0 - 44 U/L 11 16 -    Lab Results  Component Value Date   WBC 3.7 (L) 10/19/2018   HGB 10.2 (L) 10/19/2018   HCT 30.8 (L) 10/19/2018   MCV 104.4 (H) 10/19/2018   PLT 411 (H) 10/19/2018   NEUTROABS 3.0 10/19/2018    ASSESSMENT & PLAN:  Breast cancer of lower-outer quadrant of left female breast Left breast cancer invasive ductal carcinoma 2.5 cm in size grade 1, ER 99%, PR 41%, Ki-67 11%, HER-2 negative T2 N0 M0 stage II a status  post neoadjuvant chemotherapy followed by surgery February 2011and wason antiestrogen therapy with Femara from January 2011-July 2017  New onset mid back pain: MRI revealedT11 severe pathologic fracture, abnormal signal T10, T11 and T12, moderate canal stenosis at T11  12/02/2017: PET CT scan hypermetabolic lymphadenopathy in the thoracic and upper abdominal lymph nodes, spine and rib metastases, intramuscular metastases the left pectoralis muscle, T11 vertebral fracture pathologic  Biopsy of the T11 vertebral body ER 90%, PR 0%, Ki-67 10%, HER-2 -1+, foundation 1, PDL 1  Pathologic fracture T11: She met with orthopedics regarding kyphoplasty and they determined that there is no surgical options available for her. ------------------------------------------------------------------- Treatment : 1. Palliative radiation to T11 vertebral body and the pectoralis muscle: 12/20/17-12/26/17 2. current treatment: Ibrance with letrozole along with Delton See or Zometa Patient could not tolerate Faslodex because of intense bone pain as well as hypotension.  04/09/2018 bone scan: Foci of increased tracer uptake in left ribs and lower thoracic spine consistent with bone metastases. 04/09/2018: CT CAP: Reduced size of the several lymph nodes in the chest, reduced size of the abdominal lymph nodes. No new bone lesions, large left pleural effusion, left suprahilar airspace opacity 3.5 x 3 cm  Severe shortness of breath with recurrent pleural effusions: Pleurx catheter drainage.  Lately she has had some issues with that and the type to be reset.  Patient will undergo thoracentesis this Thursday and replacement of the Pleurx drain next Wednesday.  She will undergo port placement next week.  CT CAP and bone scan 10/19/2018: Worsening left pleural effusion with pleural and pericardial metastases, new hilar lymph node and subcarinal lymph node metastases, new left adrenal metastases.  New T5 sclerotic partial  compression fracture.  Small lesions in the liver.  I reviewed the scans with the patient in great detail and showed her the images. I recommended stopping Ibrance and initiating therapy with Halaven.  She will receive Halaven days 1 and 8 every 3 weeks. Chemo counseling: I discussed risks and benefits of chemotherapy with Halaven including cytopenias and LFT changes neuropathy fatigue and mild nausea.  She understands this and is willing to proceed.  Goals of care: I discussed with her that if the treatment does not work then she has less than 6 months to live.  If it does work then she could potentially have up to 2 years.   No orders of the defined types were placed in this encounter.  The patient has a good understanding  of the overall plan. she agrees with it. she will call with any problems that may develop before the next visit here.  Nicholas Lose, MD 10/21/2018  Julious Oka Dorshimer am acting as scribe for Dr. Nicholas Lose.  I have reviewed the above documentation for accuracy and completeness, and I agree with the above.

## 2018-10-21 ENCOUNTER — Inpatient Hospital Stay: Payer: 59

## 2018-10-21 ENCOUNTER — Other Ambulatory Visit (HOSPITAL_COMMUNITY): Payer: Self-pay | Admitting: Hematology and Oncology

## 2018-10-21 ENCOUNTER — Other Ambulatory Visit: Payer: Self-pay

## 2018-10-21 ENCOUNTER — Inpatient Hospital Stay (HOSPITAL_BASED_OUTPATIENT_CLINIC_OR_DEPARTMENT_OTHER): Payer: 59 | Admitting: Hematology and Oncology

## 2018-10-21 ENCOUNTER — Telehealth: Payer: Self-pay

## 2018-10-21 VITALS — BP 116/72 | HR 116 | Temp 98.4°F | Resp 17 | Ht 63.0 in | Wt 113.4 lb

## 2018-10-21 DIAGNOSIS — C50512 Malignant neoplasm of lower-outer quadrant of left female breast: Secondary | ICD-10-CM | POA: Diagnosis not present

## 2018-10-21 DIAGNOSIS — Z170 Estrogen receptor positive status [ER+]: Secondary | ICD-10-CM | POA: Insufficient documentation

## 2018-10-21 DIAGNOSIS — C7951 Secondary malignant neoplasm of bone: Secondary | ICD-10-CM | POA: Diagnosis not present

## 2018-10-21 DIAGNOSIS — C7800 Secondary malignant neoplasm of unspecified lung: Secondary | ICD-10-CM

## 2018-10-21 DIAGNOSIS — Z7189 Other specified counseling: Secondary | ICD-10-CM | POA: Insufficient documentation

## 2018-10-21 DIAGNOSIS — Z5111 Encounter for antineoplastic chemotherapy: Secondary | ICD-10-CM | POA: Diagnosis not present

## 2018-10-21 DIAGNOSIS — C50919 Malignant neoplasm of unspecified site of unspecified female breast: Secondary | ICD-10-CM

## 2018-10-21 MED ORDER — LORAZEPAM 0.5 MG PO TABS: 0.5000 mg | ORAL_TABLET | Freq: Four times a day (QID) | ORAL | 0 refills | Status: DC | PRN

## 2018-10-21 MED ORDER — ONDANSETRON HCL 8 MG PO TABS: 8.0000 mg | ORAL_TABLET | Freq: Two times a day (BID) | ORAL | 1 refills | Status: DC | PRN

## 2018-10-21 MED ORDER — TRAMADOL HCL 50 MG PO TABS: 50.0000 mg | ORAL_TABLET | Freq: Four times a day (QID) | ORAL | 0 refills | Status: DC | PRN

## 2018-10-21 MED ORDER — PROCHLORPERAZINE MALEATE 10 MG PO TABS: 10.0000 mg | ORAL_TABLET | Freq: Four times a day (QID) | ORAL | 1 refills | Status: DC | PRN

## 2018-10-21 MED ORDER — LIDOCAINE-PRILOCAINE 2.5-2.5 % EX CREA: TOPICAL_CREAM | CUTANEOUS | 3 refills | Status: DC

## 2018-10-21 NOTE — Progress Notes (Signed)
Orders placed for port placement. IR to review orders for placement along with Pleurx drain removal and placement.

## 2018-10-21 NOTE — Progress Notes (Signed)
START ON PATHWAY REGIMEN - Breast     A cycle is every 21 days:     Eribulin mesylate   **Always confirm dose/schedule in your pharmacy ordering system**  Patient Characteristics: Distant Metastases or Locoregional Recurrent Disease - Unresected or Locally Advanced Unresectable Disease Progressing after Neoadjuvant and Local Therapies, HER2 Negative/Unknown/Equivocal, ER Positive, Chemotherapy, Third Line and Beyond, Eribulin  Candidate Therapeutic Status: Distant Metastases BRCA Mutation Status: Absent ER Status: Positive (+) HER2 Status: Negative (-) PR Status: Positive (+) Line of Therapy: Third Line and Beyond Intent of Therapy: Non-Curative / Palliative Intent, Discussed with Patient 

## 2018-10-21 NOTE — Telephone Encounter (Signed)
Per MD recommendations patient needs to be set up for Pleurx drain removal, and have new drain placed.  Port placement need for chemotherapy.    RN spoke with IR @ [place]. Pt scheduled for COVID test prior to procedures for 7/9.  Pt scheduled for drain removal and thoracentesis on Friday 7/10 @ 12:30 being NPO 4 hours prior.  Port placement and new drain placement is scheduled for 10/27/2018 @ 10:30am.  NPO 6 hours prior, driver needed, patient aware and educated on instructions.  No further needs at this time.

## 2018-10-22 ENCOUNTER — Inpatient Hospital Stay (HOSPITAL_COMMUNITY): Admission: RE | Admit: 2018-10-22 | Payer: 59 | Source: Ambulatory Visit

## 2018-10-22 ENCOUNTER — Telehealth: Payer: Self-pay

## 2018-10-22 NOTE — Telephone Encounter (Signed)
Pt. Called in reference to rearranging appointments scheduled. Spoke with Tye Maryland at Interventional radiology who helped with rescheduling Pt's appointments. Pt. Will come in on 10/28/18 to get PAC placement and drain removed and replaced all in one day. Covid testing rescheduled for Monday 10/26/18. IR will call if any changes.

## 2018-10-23 ENCOUNTER — Telehealth: Payer: Self-pay

## 2018-10-23 ENCOUNTER — Ambulatory Visit (HOSPITAL_COMMUNITY): Payer: 59

## 2018-10-23 ENCOUNTER — Emergency Department
Admission: EM | Admit: 2018-10-23 | Discharge: 2018-10-23 | Disposition: A | Discharge status: Home / Self Care | Payer: 59 | Attending: Student in an Organized Health Care Education/Training Program | Admitting: Student in an Organized Health Care Education/Training Program

## 2018-10-23 ENCOUNTER — Emergency Department: Payer: 59

## 2018-10-23 ENCOUNTER — Other Ambulatory Visit: Payer: Self-pay

## 2018-10-23 ENCOUNTER — Encounter: Payer: Self-pay | Admitting: Emergency Medicine

## 2018-10-23 DIAGNOSIS — Z20828 Contact with and (suspected) exposure to other viral communicable diseases: Secondary | ICD-10-CM | POA: Diagnosis not present

## 2018-10-23 DIAGNOSIS — Z853 Personal history of malignant neoplasm of breast: Secondary | ICD-10-CM | POA: Diagnosis not present

## 2018-10-23 DIAGNOSIS — Z96641 Presence of right artificial hip joint: Secondary | ICD-10-CM | POA: Diagnosis not present

## 2018-10-23 DIAGNOSIS — E039 Hypothyroidism, unspecified: Secondary | ICD-10-CM | POA: Insufficient documentation

## 2018-10-23 DIAGNOSIS — J90 Pleural effusion, not elsewhere classified: Secondary | ICD-10-CM

## 2018-10-23 DIAGNOSIS — C7951 Secondary malignant neoplasm of bone: Secondary | ICD-10-CM | POA: Insufficient documentation

## 2018-10-23 DIAGNOSIS — I10 Essential (primary) hypertension: Secondary | ICD-10-CM | POA: Insufficient documentation

## 2018-10-23 DIAGNOSIS — R0602 Shortness of breath: Secondary | ICD-10-CM

## 2018-10-23 DIAGNOSIS — Z79899 Other long term (current) drug therapy: Secondary | ICD-10-CM | POA: Diagnosis not present

## 2018-10-23 LAB — COMPREHENSIVE METABOLIC PANEL
ALT: 24 U/L (ref 0–44)
AST: 49 U/L — ABNORMAL HIGH (ref 15–41)
Albumin: 2.8 g/dL — ABNORMAL LOW (ref 3.5–5.0)
Alkaline Phosphatase: 141 U/L — ABNORMAL HIGH (ref 38–126)
Anion gap: 13 (ref 5–15)
BUN: 13 mg/dL (ref 8–23)
CO2: 24 mmol/L (ref 22–32)
Calcium: 8.9 mg/dL (ref 8.9–10.3)
Chloride: 98 mmol/L (ref 98–111)
Creatinine, Ser: 0.91 mg/dL (ref 0.44–1.00)
GFR calc Af Amer: >60 mL/min (ref >60)
GFR calc non Af Amer: >60 mL/min (ref >60)
Glucose, Bld: 130 mg/dL — ABNORMAL HIGH (ref 70–99)
Potassium: 4 mmol/L (ref 3.5–5.1)
Sodium: 135 mmol/L (ref 135–145)
Total Bilirubin: 0.4 mg/dL (ref 0.3–1.2)
Total Protein: 7.7 g/dL (ref 6.5–8.1)

## 2018-10-23 LAB — CBC WITH DIFFERENTIAL/PLATELET
Abs Immature Granulocytes: 0.02 10*3/uL (ref 0.00–0.07)
Basophils Absolute: 0 10*3/uL (ref 0.0–0.1)
Basophils Relative: 1 %
Eosinophils Absolute: 0 10*3/uL (ref 0.0–0.5)
Eosinophils Relative: 1 %
HCT: 27.9 % — ABNORMAL LOW (ref 36.0–46.0)
Hemoglobin: 9.2 g/dL — ABNORMAL LOW (ref 12.0–15.0)
Immature Granulocytes: 1 %
Lymphocytes Relative: 13 %
Lymphs Abs: 0.4 10*3/uL — ABNORMAL LOW (ref 0.7–4.0)
MCH: 34.3 pg — ABNORMAL HIGH (ref 26.0–34.0)
MCHC: 33 g/dL (ref 30.0–36.0)
MCV: 104.1 fL — ABNORMAL HIGH (ref 80.0–100.0)
Monocytes Absolute: 0.3 10*3/uL (ref 0.1–1.0)
Monocytes Relative: 9 %
Neutro Abs: 2.4 10*3/uL (ref 1.7–7.7)
Neutrophils Relative %: 75 %
Platelets: 383 10*3/uL (ref 150–400)
RBC: 2.68 MIL/uL — ABNORMAL LOW (ref 3.87–5.11)
RDW: 14 % (ref 11.5–15.5)
WBC: 3.2 10*3/uL — ABNORMAL LOW (ref 4.0–10.5)
nRBC: 0 % (ref 0.0–0.2)

## 2018-10-23 LAB — SARS CORONAVIRUS 2 BY RT PCR (HOSPITAL ORDER, PERFORMED IN ~~LOC~~ HOSPITAL LAB): SARS Coronavirus 2: NEGATIVE

## 2018-10-23 MED ORDER — BUPIVACAINE HCL 0.5 % IJ SOLN
50.0000 mL | Freq: Once | INTRAMUSCULAR | Status: AC
  Administered 2018-10-23: 30 mL

## 2018-10-23 MED ORDER — FENTANYL CITRATE (PF) 100 MCG/2ML IJ SOLN: 50.0000 ug | Freq: Once | INTRAMUSCULAR | Status: DC

## 2018-10-23 MED ORDER — ONDANSETRON HCL 4 MG/2ML IJ SOLN
4.0000 mg | Freq: Once | INTRAMUSCULAR | Status: AC
  Administered 2018-10-23: 4 mg via INTRAVENOUS

## 2018-10-23 MED ORDER — FENTANYL CITRATE (PF) 100 MCG/2ML IJ SOLN
25.0000 ug | Freq: Once | INTRAMUSCULAR | Status: AC
  Administered 2018-10-23: 18:00:00 25 ug via INTRAVENOUS

## 2018-10-23 MED ORDER — ONDANSETRON HCL 4 MG/2ML IJ SOLN
INTRAMUSCULAR | Status: AC
  Administered 2018-10-23: 19:00:00 4 mg via INTRAVENOUS
  Filled 2018-10-23: qty 2

## 2018-10-23 MED ORDER — FENTANYL CITRATE (PF) 100 MCG/2ML IJ SOLN
INTRAMUSCULAR | Status: AC
  Administered 2018-10-23: 18:00:00 25 ug via INTRAVENOUS
  Filled 2018-10-23: qty 2

## 2018-10-23 MED ORDER — ALTEPLASE 100 MG IV SOLR: 6.0000 mg | Freq: Once | INTRAVENOUS | Status: DC

## 2018-10-23 MED ORDER — FENTANYL CITRATE (PF) 100 MCG/2ML IJ SOLN
25.0000 ug | Freq: Once | INTRAMUSCULAR | Status: AC
  Administered 2018-10-23: 25 ug via INTRAVENOUS

## 2018-10-23 NOTE — Telephone Encounter (Signed)
RN spoke with patient.  Patient with severe increased shortness of breath.  Patient reports tightness in her chest, pleurx drain has not been draining per patient.    RN encouraged patient to go straight to ED due to [facility].  Pt verbalized agreement.  Pt will report to [facility].   RN will call ED for report.

## 2018-10-23 NOTE — Discharge Instructions (Addendum)
As we discussed please follow-up with Dr. Lindi Adie.  The "fluid clinic "has also received your information and will try to work you into clinic on Monday for repeat thoracentesis.  At any point if you have worsening shortness of breath, fevers any questions, concerns please seek medical attention return to the ER.

## 2018-10-23 NOTE — ED Triage Notes (Signed)
Pt has clogged plurex catheter.   Has not been able to pull fluid off lungs for 1.5 weeks, normally does twice per week.  Labored in triage.  sats WNL.  Starting to have pain in left chest as well.

## 2018-10-23 NOTE — ED Notes (Signed)
X-ray at bedside

## 2018-10-23 NOTE — ED Provider Notes (Signed)
[facility] Emergency Department Provider Note    First MD Initiated Contact with Patient 10/23/18 1649     (approximate)  I have reviewed the triage vital signs and the nursing notes.   HISTORY  Chief Complaint Shortness of Breath    HPI Elizabeth [name] is a 63 y.o. female with a history of are placed Pleurx catheter for recurrent malignant effusion presents the ER for 1/2 weeks of worsening shortness of breath left-sided chest discomfort.  Has had issues with his Pleurx catheter being clogged requiring TPA dwell and interval thoracentesis to keep up with her effusion.  Was seen by home health today and was noted to be tachycardic as well as tachypneic and was sent to the ER for further evaluation.  She was supposed to have had the Pleurx catheter exchanged today but due to some scheduling mixup it was changed to Wednesday.    Past Medical History:  Diagnosis Date   Arthritis    Breast cancer ([place]) 2010   Left   Chronic sinusitis    Family history of adverse reaction to anesthesia    sister-nausea/vomiting   History of chemotherapy 01/2009-03/2009   Hypercholesteremia    Hypothyroid    "inactive thyroid"   Metastasis ([place]) 11/2017   LN   Metastatic cancer to bone ([place]) 11/2017   Migraines    RSD (reflex sympathetic dystrophy)    Family History  Problem Relation Age of Onset   Hypertension Mother    Past Surgical History:  Procedure Laterality Date   BREAST RECONSTRUCTION     EYE SURGERY Bilateral 7/18, 10/18   IR INSTILL VIA CHEST TUBE AGENT FOR FIBRINOLYSIS INI DAY  10/09/2018   IR PATIENT EVAL TECH 0-60 MINS  09/08/2018   IR PERC PLEURAL DRAIN W/INDWELL CATH W/IMG GUIDE  08/05/2018   IR RADIOLOGIST EVAL & MGMT  10/12/2018   KNEE ARTHROSCOPY Bilateral    KNEE SURGERY Right    Two surgeries   MASTECTOMY Bilateral 05/22/2009   MASTECTOMY Bilateral 2011   restrictions on left arm-no labs/bloodpressure   NASAL SINUS  SURGERY     PORTA CATH INSERTION  12/2008   PORTA CATH REMOVAL  05/2009   ROOT CANAL     SHOULDER SURGERY Right    TOTAL HIP ARTHROPLASTY Left 05/26/2017   Procedure: TOTAL HIP ARTHROPLASTY ANTERIOR APPROACH;  Surgeon: Frederik Pear, MD;  Location: [place];  Service: Orthopedics;  Laterality: Left;   Patient Active Problem List   Diagnosis Date Noted   Malignant neoplasm of lower-outer quadrant of left breast of female, estrogen receptor positive ([place]) 10/21/2018   Goals of care, counseling/discussion 10/21/2018   Bone metastasis ([place]) 12/22/2017   Primary osteoarthritis of left hip 05/26/2017   Osteoarthritis of left hip 05/23/2017   Breast cancer of lower-outer quadrant of left female breast ([place]) 08/06/2012      Prior to Admission medications   Medication Sig Start Date End Date Taking? Authorizing Provider  Biotin 10000 MCG TABS Take 10,000 mcg by mouth daily.     [provider]  Cholecalciferol (VITAMIN D PO) Take 5,000 Units by mouth daily.     [provider]  DULoxetine (CYMBALTA) 30 MG capsule TAKE 1 CAPSULE (30 MG TOTAL) BY MOUTH 2 (TWO) TIMES DAILY. 10/13/18   Nicholas Lose, MD  gabapentin (NEURONTIN) 300 MG capsule TAKE 1 CAPSULE BY MOUTH EVERYDAY AT BEDTIME 10/13/18   Nicholas Lose, MD  letrozole [facility]) 2.5 MG tablet Take 1 tablet (2.5 mg total)  by mouth daily. 01/12/18   Nicholas Lose, MD  levothyroxine (SYNTHROID, LEVOTHROID) 25 MCG tablet Take 25 mcg by mouth daily before breakfast.     [provider]  lidocaine-prilocaine (EMLA) cream Apply to affected area once 10/21/18   Nicholas Lose, MD  loperamide (IMODIUM) 2 MG capsule 1 to 2 PO QID prn diarrhea 10/05/18   Tanner, Lyndon Code., PA-C  LORazepam (ATIVAN) 0.5 MG tablet Take 1 tablet (0.5 mg total) by mouth every 6 (six) hours as needed (Nausea or vomiting). 10/21/18   Nicholas Lose, MD  magic mouthwash w/lidocaine SOLN Take 5 mLs by mouth 3 (three) times daily as needed for mouth pain.  05/25/18   Nicholas Lose, MD  Multiple Vitamin (MULTIVITAMIN) tablet Take 1 tablet by mouth daily.    [provider]  Omega-3 Fatty Acids (SUPER OMEGA 3 EPA/DHA PO) Take 1 capsule by mouth daily.    [provider]  omeprazole (PRILOSEC) 20 MG capsule Take 1 capsule (20 mg total) by mouth daily. 10/05/18   Tanner, Lyndon Code., PA-C  ondansetron (ZOFRAN) 8 MG tablet Take 1 tablet (8 mg total) by mouth 2 (two) times daily as needed (Nausea or vomiting). 10/21/18   Nicholas Lose, MD  oxyCODONE-acetaminophen (PERCOCET) 10-325 MG tablet Take 1 tablet by mouth every 8 (eight) hours as needed for pain. 08/17/18   Nicholas Lose, MD  palbociclib (IBRANCE) 100 MG capsule Take 1 capsule (100 mg total) by mouth daily with breakfast. Take whole with food. Take for 21 days on, 7 days off, repeat every 28 days. 05/25/18   Nicholas Lose, MD  prednisoLONE acetate (PRED FORTE) 1 % ophthalmic suspension Place 1 drop into both eyes daily.    [provider]  prochlorperazine (COMPAZINE) 10 MG tablet Take 1 tablet (10 mg total) by mouth every 6 (six) hours as needed (Nausea or vomiting). 10/21/18   Nicholas Lose, MD  prochlorperazine (COMPAZINE) 5 MG tablet Take 1 tablet (5 mg total) by mouth every 6 (six) hours as needed for nausea or vomiting. 10/05/18   Tanner, Lyndon Code., PA-C  simethicone (GAS-X) 80 MG chewable tablet Chew 1 tablet (80 mg total) by mouth every 6 (six) hours as needed for flatulence. 10/05/18   Tanner, Lyndon Code., PA-C  traMADol (ULTRAM) 50 MG tablet Take 1 tablet (50 mg total) by mouth every 6 (six) hours as needed. 10/21/18   Nicholas Lose, MD    Allergies Shellfish allergy and Sulfa antibiotics    Social History Social History   Tobacco Use   Smoking status: Never Smoker   Smokeless tobacco: Never Used  Substance Use Topics   Alcohol use: No   Drug use: No    Review of Systems Patient denies headaches, rhinorrhea, blurry vision, numbness, shortness of breath, chest pain,  edema, cough, abdominal pain, nausea, vomiting, diarrhea, dysuria, fevers, rashes or hallucinations unless otherwise stated above in HPI. ____________________________________________   PHYSICAL EXAM:  VITAL SIGNS: Vitals:   10/23/18 2100 10/23/18 2111  BP: 106/68 109/73  Pulse: (!) 103 100  Resp: (!) 21 20  Temp:    SpO2: 97% 95%    Constitutional: Alert and oriented.  Eyes: Conjunctivae are normal.  Head: Atraumatic. Nose: No congestion/rhinnorhea. Mouth/Throat: Mucous membranes are moist.   Neck: No stridor. Painless ROM.  Cardiovascular: Normal rate, regular rhythm. Grossly normal heart sounds.  Good peripheral circulation. Respiratory: tachypnea in mild respiratory distress,  Absent breathsounds on the left Gastrointestinal: Soft and nontender. No distention. No abdominal bruits. No CVA tenderness.  Genitourinary:  Musculoskeletal: No lower extremity tenderness nor edema.  No joint effusions. Neurologic:  Normal speech and language. No gross focal neurologic deficits are appreciated. No facial droop Skin:  Skin is warm, dry and intact. No rash noted. Psychiatric: Mood and affect are normal. Speech and behavior are normal.  ____________________________________________   LABS (all labs ordered are listed, but only abnormal results are displayed)  Results for orders placed or performed during the hospital encounter of 10/23/18 (from the past 24 hour(s))  SARS Coronavirus 2 (CEPHEID - Performed in [place] hospital lab), Hosp Order     Status: None   Collection Time: 10/23/18  4:23 PM   Specimen: Nasopharyngeal Swab  Result Value Ref Range   SARS Coronavirus 2 NEGATIVE NEGATIVE  CBC with Differential     Status: Abnormal   Collection Time: 10/23/18  4:59 PM  Result Value Ref Range   WBC 3.2 (L) 4.0 - 10.5 K/uL   RBC 2.68 (L) 3.87 - 5.11 MIL/uL   Hemoglobin 9.2 (L) 12.0 - 15.0 g/dL   HCT 27.9 (L) 36.0 - 46.0 %   MCV 104.1 (H) 80.0 - 100.0 fL   MCH 34.3 (H) 26.0 -  34.0 pg   MCHC 33.0 30.0 - 36.0 g/dL   RDW 14.0 11.5 - 15.5 %   Platelets 383 150 - 400 K/uL   nRBC 0.0 0.0 - 0.2 %   Neutrophils Relative % 75 %   Neutro Abs 2.4 1.7 - 7.7 K/uL   Lymphocytes Relative 13 %   Lymphs Abs 0.4 (L) 0.7 - 4.0 K/uL   Monocytes Relative 9 %   Monocytes Absolute 0.3 0.1 - 1.0 K/uL   Eosinophils Relative 1 %   Eosinophils Absolute 0.0 0.0 - 0.5 K/uL   Basophils Relative 1 %   Basophils Absolute 0.0 0.0 - 0.1 K/uL   Immature Granulocytes 1 %   Abs Immature Granulocytes 0.02 0.00 - 0.07 K/uL  Comprehensive metabolic panel     Status: Abnormal   Collection Time: 10/23/18  4:59 PM  Result Value Ref Range   Sodium 135 135 - 145 mmol/L   Potassium 4.0 3.5 - 5.1 mmol/L   Chloride 98 98 - 111 mmol/L   CO2 24 22 - 32 mmol/L   Glucose, Bld 130 (H) 70 - 99 mg/dL   BUN 13 8 - 23 mg/dL   Creatinine, Ser 0.91 0.44 - 1.00 mg/dL   Calcium 8.9 8.9 - 10.3 mg/dL   Total Protein 7.7 6.5 - 8.1 g/dL   Albumin 2.8 (L) 3.5 - 5.0 g/dL   AST 49 (H) 15 - 41 U/L   ALT 24 0 - 44 U/L   Alkaline Phosphatase 141 (H) 38 - 126 U/L   Total Bilirubin 0.4 0.3 - 1.2 mg/dL   GFR calc non Af Amer >60 >60 mL/min   GFR calc Af Amer >60 >60 mL/min   Anion gap 13 5 - 15   ____________________________________________  EKG My review and personal interpretation at Time: 16:20   Indication: sob  Rate: 130  Rhythm: sinus Axis: normal Other: poor r wave progression, nonspecific st abn, ____________________________________________  RADIOLOGY  I personally reviewed all radiographic images ordered to evaluate for the above acute complaints and reviewed radiology reports and findings.  These findings were personally discussed with the patient.  Please see medical record for radiology report.  ____________________________________________   PROCEDURES  Procedure(s) performed:  THORACENTESIS BEDSIDE  Date/Time: 10/23/2018 9:41 PM Performed by: Merlyn Lot, MD Authorized  by: Merlyn Lot, MD   Consent:    Consent obtained:  Verbal   Consent given by:  Patient   Risks discussed:  Bleeding, incomplete drainage, infection, nerve damage, pain and pneumothorax   Alternatives discussed:  Delayed treatment, referral, observation, alternative treatment and no treatment Anesthesia (see MAR for exact dosages):    Anesthesia method:  Local infiltration   Local anesthetic:  Bupivacaine 0.5% w/o epi Procedure details:    Preparation: Patient was prepped and draped in usual sterile fashion     Patient position:  Sitting   Location:  L posterior   Intercostal space:  6th   Puncture method:  Over-the-needle catheter   Ultrasound guidance: yes     Indwelling catheter placed: no     Needle gauge:  14   Catheter size:  8 Fr   Number of attempts:  1   Drainage characteristics:  Serosanguinous Post-procedure details:    Chest x-ray performed: yes     Chest x-ray findings:  Pleural effusion improved   Patient tolerance of procedure:  Tolerated well, no immediate complications      Critical Care performed: no ____________________________________________   INITIAL IMPRESSION / ASSESSMENT AND PLAN / ED COURSE  Pertinent labs & imaging results that were available during my care of the patient were reviewed by me and considered in my medical decision making (see chart for details).   DDX: effusion, Asthma, copd, CHF, pna, ptx, malignancy, Pe, anemia   Elizabeth [name] is a 63 y.o. who presents to the ED with shortness of breath with recurrent pleural effusion.  Patient with nonfunctioning Pleurx catheter.  Initial thought was to see if we can do the TPA dwell but on further review discussed with patient this never seemed to really work on the last try.  My concern is that her effusion is loculated or that the Pleurx catheter simply is not reaching the draining fluid.  We discussed options given her tachypnea and tachycardia and I did recommend that we need to perform some  intervention more urgently.  We discussed options including attempted TPA dwelling for the catheter versus thoracentesis and after discussing the risks and benefits of both options we proceeded with thoracentesis.  Clinical Course as of Oct 23 2142  Fri Oct 23, 2018  1914 Platelets: [place] Patient did have a significant improvement in her respirations after thoracentesis however was only able to draw little less than 500.  Suspect that this effusion is heavily loculated.  Her heart rate is now 95 with improvement in respirations.  Discussed with interventional radiology the best next steps as it does seem that this Pleurx catheter is nonfunctioning at this time we do not have any capability for the TPA dwell and I am unsure as to whether this would actually help anyways given how loculated this effusion likely is.  Will consult with her oncologist.   [PR]  [address] in consultation with Dr. Corliss Skains who works with Dr. Lindi Adie her presentation.  She is not having persistent hypoxia feels significantly improved having pulled off the proximately 500 mL of fluid that she would get twice weekly they would feel comfortable with her being at home as an option she can be observed in the hospital for possible repeat thoracentesis.  I discussed options including recommendation for observation and transfer to [facility] [place] for repeat [place] interventional radiology evaluation and possible CT consultation.  Patient is declined this states that she feels much improved and states that  she would prefer to go home.  Her tachycardia has resolved and she is not hypoxic.  Tachypnea is also resolved.  At this point I think that is reasonable option.   [PR]    Clinical Course User Index [PR] Merlyn Lot, MD    The patient was evaluated in Emergency Department today for the symptoms described in the history of present illness. He/she was evaluated in the context of the global COVID-19 pandemic, which necessitated  consideration that the patient might be at risk for infection with the SARS-CoV-2 virus that causes COVID-19. Institutional protocols and algorithms that pertain to the evaluation of patients at risk for COVID-19 are in a state of rapid change based on information released by regulatory bodies including the CDC and federal and state organizations. These policies and algorithms were followed during the patient's care in the ED.  As part of my medical decision making, I reviewed the following data within the [place] notes reviewed and incorporated, Labs reviewed, notes from prior ED visits and [place] Controlled Substance Database   ____________________________________________   FINAL CLINICAL IMPRESSION(S) / ED DIAGNOSES  Final diagnoses:  Pleural effusion on left  Shortness of breath      NEW MEDICATIONS STARTED DURING THIS VISIT:  New Prescriptions   No medications on file     Note:  This document was prepared using Dragon voice recognition software and may include unintentional dictation errors.    Merlyn Lot, MD 10/23/18 2144

## 2018-10-26 ENCOUNTER — Other Ambulatory Visit (HOSPITAL_COMMUNITY): Payer: Self-pay | Admitting: Student

## 2018-10-26 ENCOUNTER — Encounter (HOSPITAL_COMMUNITY): Payer: Self-pay | Admitting: Student

## 2018-10-26 ENCOUNTER — Other Ambulatory Visit (HOSPITAL_COMMUNITY)
Admission: RE | Admit: 2018-10-26 | Discharge: 2018-10-26 | Disposition: A | Discharge status: Home / Self Care | Payer: 59 | Source: Ambulatory Visit

## 2018-10-26 ENCOUNTER — Ambulatory Visit (HOSPITAL_COMMUNITY)
Admission: RE | Admit: 2018-10-26 | Discharge: 2018-10-26 | Disposition: A | Discharge status: Home / Self Care | Payer: 59 | Source: Ambulatory Visit | Attending: Hematology and Oncology | Admitting: Hematology and Oncology

## 2018-10-26 ENCOUNTER — Other Ambulatory Visit: Payer: Self-pay

## 2018-10-26 ENCOUNTER — Other Ambulatory Visit: Payer: Self-pay | Admitting: Hematology and Oncology

## 2018-10-26 ENCOUNTER — Inpatient Hospital Stay: Payer: 59

## 2018-10-26 ENCOUNTER — Telehealth: Payer: Self-pay | Admitting: *Deleted

## 2018-10-26 DIAGNOSIS — J90 Pleural effusion, not elsewhere classified: Secondary | ICD-10-CM | POA: Insufficient documentation

## 2018-10-26 DIAGNOSIS — Z452 Encounter for adjustment and management of vascular access device: Secondary | ICD-10-CM | POA: Insufficient documentation

## 2018-10-26 DIAGNOSIS — C50512 Malignant neoplasm of lower-outer quadrant of left female breast: Secondary | ICD-10-CM

## 2018-10-26 DIAGNOSIS — Z1159 Encounter for screening for other viral diseases: Secondary | ICD-10-CM | POA: Diagnosis not present

## 2018-10-26 DIAGNOSIS — C50919 Malignant neoplasm of unspecified site of unspecified female breast: Secondary | ICD-10-CM

## 2018-10-26 HISTORY — PX: IR REMOVAL OF PLURAL CATH W/CUFF: IMG5346

## 2018-10-26 MED ORDER — LIDOCAINE HCL 1 % IJ SOLN
INTRAMUSCULAR | Status: AC
  Filled 2018-10-26: qty 40

## 2018-10-26 NOTE — Progress Notes (Signed)
Patient Care Team: Redmon, Barth Kirks, PA-C as PCP - General (Nurse Practitioner)  DIAGNOSIS:    ICD-10-CM   1. Malignant neoplasm of lower-outer quadrant of left breast of female, estrogen receptor positive ([place])  C50.512    Z17.0     SUMMARY OF ONCOLOGIC HISTORY: Oncology History  Breast cancer of lower-outer quadrant of left female breast ([place])  12/13/2008 Initial Diagnosis   Left breast biopsy: Invasive ductal carcinoma ER 90% percent, PR 41%, Ki-67 11%, HER-2 negative ratio 1, Oncotype DX score 23, ROR15%   01/27/2009 - 03/10/2009 Neo-Adjuvant Chemotherapy   Neoadjuvant FEC 4; participant in the "bed sheets" study.   05/12/2009 -  Anti-estrogen oral therapy   Femara 2.5 mg daily   05/22/2009 Surgery   Bilateral mastectomy stage IIB T2 N0 M0 IDC left breast grade 1, 2.5 cm, ER 99%, PR 41%, Ki-67 11%, HER-2 negative   12/10/2009 Surgery   Breast reconstruction surgery   11/14/2017 Relapse/Recurrence   Mid back pain: MRI revealed T11 severe pathologic fracture, abnormal signal T10, T11 and T12, moderate canal stenosis at T11   12/03/2017 PET scan   Hypermetabolic metastatic breast cancer involving thoracic and upper abdominal lymph nodes, spine and ribs. There may be hypermetabolic adenopathy in the left neck; Hypermetabolic lymph node versus intramuscular metastasis involving the medial left pectoralis musculature Pathologic T11 fracture     12/11/2017 Procedure   Biopsy of T11 vertebral body lytic lesion: Metastatic breast adenocarcinoma ER positive, PR negative   12/19/2017 - 12/26/2017 Radiation Therapy   Palliative XRT to T 11   01/12/2018 -  Anti-estrogen oral therapy   Patient could not tolerate Faslodex because of severe hypotension and severe pain in the legs after injections.  Ibrance with letrozole starting 01/12/2018   Malignant neoplasm of lower-outer quadrant of left breast of female, estrogen receptor positive ([place])  10/21/2018 Initial Diagnosis   Malignant neoplasm  of lower-outer quadrant of left breast of female, estrogen receptor positive ([place])   10/27/2018 -  Chemotherapy   The patient had eriBULin mesylate (HALAVEN) 2.1 mg in sodium chloride 0.9 % 100 mL chemo infusion, 1.4 mg/m2 = 2.1 mg, Intravenous,  Once, 0 of 4 cycles  for chemotherapy treatment.      CHIEF COMPLIANT: Cycle 1 Day 1 Halaven  INTERVAL HISTORY: Elizabeth [name] is a 63 y.o. with above-mentioned history of metastatic breast cancer who progressed on Ibrance with letrozole and Zometa and has had recurrent pleural effusions. She was switched to treatment with Halaven. She presents to the clinic today for day 1 cycle 1.   REVIEW OF SYSTEMS:   Constitutional: Denies fevers, chills or abnormal weight loss Eyes: Denies blurriness of vision Ears, nose, mouth, throat, and face: Denies mucositis or sore throat Respiratory: Denies cough, dyspnea or wheezes Cardiovascular: Denies palpitation, chest discomfort Gastrointestinal: Denies nausea, heartburn or change in bowel habits Skin: Denies abnormal skin rashes Lymphatics: Denies new lymphadenopathy or easy bruising Neurological: Denies numbness, tingling or new weaknesses Behavioral/Psych: Mood is stable, no new changes  Extremities: No lower extremity edema Breast: denies any pain or lumps or nodules in either breasts All other systems were reviewed with the patient and are negative.  I have reviewed the past medical history, past surgical history, social history and family history with the patient and they are unchanged from previous note.  ALLERGIES:  is allergic to shellfish allergy and sulfa antibiotics.  MEDICATIONS:  Current Outpatient Medications  Medication Sig Dispense Refill  . Biotin 10000 MCG TABS Take 10,000  mcg by mouth daily.     . Cholecalciferol (VITAMIN D PO) Take 5,000 Units by mouth daily.     . DULoxetine (CYMBALTA) 30 MG capsule TAKE 1 CAPSULE (30 MG TOTAL) BY MOUTH 2 (TWO) TIMES DAILY. 180 capsule 2  .  gabapentin (NEURONTIN) 300 MG capsule TAKE 1 CAPSULE BY MOUTH EVERYDAY AT BEDTIME 90 capsule 2  . letrozole (FEMARA) 2.5 MG tablet Take 1 tablet (2.5 mg total) by mouth daily. 90 tablet 3  . levothyroxine (SYNTHROID, LEVOTHROID) 25 MCG tablet Take 25 mcg by mouth daily before breakfast.     . lidocaine-prilocaine (EMLA) cream Apply to affected area once 30 g 3  . loperamide (IMODIUM) 2 MG capsule 1 to 2 PO QID prn diarrhea 30 capsule 22  . LORazepam (ATIVAN) 0.5 MG tablet Take 1 tablet (0.5 mg total) by mouth every 6 (six) hours as needed (Nausea or vomiting). 30 tablet 0  . magic mouthwash w/lidocaine SOLN Take 5 mLs by mouth 3 (three) times daily as needed for mouth pain. 100 mL 0  . Multiple Vitamin (MULTIVITAMIN) tablet Take 1 tablet by mouth daily.    . Omega-3 Fatty Acids (SUPER OMEGA 3 EPA/DHA PO) Take 1 capsule by mouth daily.    Marland Kitchen omeprazole (PRILOSEC) 20 MG capsule Take 1 capsule (20 mg total) by mouth daily. 30 capsule 11  . ondansetron (ZOFRAN) 8 MG tablet Take 1 tablet (8 mg total) by mouth 2 (two) times daily as needed (Nausea or vomiting). 30 tablet 1  . oxyCODONE-acetaminophen (PERCOCET) 10-325 MG tablet Take 1 tablet by mouth every 8 (eight) hours as needed for pain. 90 tablet 0  . palbociclib (IBRANCE) 100 MG capsule Take 1 capsule (100 mg total) by mouth daily with breakfast. Take whole with food. Take for 21 days on, 7 days off, repeat every 28 days. 21 capsule 6  . prednisoLONE acetate (PRED FORTE) 1 % ophthalmic suspension Place 1 drop into both eyes daily.    . prochlorperazine (COMPAZINE) 10 MG tablet Take 1 tablet (10 mg total) by mouth every 6 (six) hours as needed (Nausea or vomiting). 30 tablet 1  . prochlorperazine (COMPAZINE) 5 MG tablet Take 1 tablet (5 mg total) by mouth every 6 (six) hours as needed for nausea or vomiting. 45 tablet 2  . simethicone (GAS-X) 80 MG chewable tablet Chew 1 tablet (80 mg total) by mouth every 6 (six) hours as needed for flatulence. 120  tablet 2  . traMADol (ULTRAM) 50 MG tablet Take 1 tablet (50 mg total) by mouth every 6 (six) hours as needed. 30 tablet 0   No current facility-administered medications for this visit.     PHYSICAL EXAMINATION: ECOG PERFORMANCE STATUS: 1 - Symptomatic but completely ambulatory  Vitals:   10/27/18 0801  BP: 111/75  Pulse: (!) 110  Resp: 17  Temp: 98.3 F (36.8 C)  SpO2: 95%   Filed Weights   10/27/18 0801  Weight: 111 lb 8 oz (50.6 kg)    GENERAL: alert, no distress and comfortable SKIN: skin color, texture, turgor are normal, no rashes or significant lesions EYES: normal, Conjunctiva are pink and non-injected, sclera clear OROPHARYNX: no exudate, no erythema and lips, buccal mucosa, and tongue normal  NECK: supple, thyroid normal size, non-tender, without nodularity LYMPH: no palpable lymphadenopathy in the cervical, axillary or inguinal LUNGS: clear to auscultation and percussion with normal breathing effort HEART: regular rate & rhythm and no murmurs and no lower extremity edema ABDOMEN: abdomen soft, non-tender and normal  bowel sounds MUSCULOSKELETAL: no cyanosis of digits and no clubbing  NEURO: alert & oriented x 3 with fluent speech, no focal motor/sensory deficits EXTREMITIES: No lower extremity edema  LABORATORY DATA:  I have reviewed the data as listed CMP Latest Ref Rng & Units 10/23/2018 10/19/2018 10/05/2018  Glucose 70 - 99 mg/dL 130(H) 123(H) 130(H)  BUN 8 - 23 mg/dL '13 10 13  ' Creatinine 0.44 - 1.00 mg/dL 0.91 1.05(H) 0.91  Sodium 135 - 145 mmol/L 135 138 138  Potassium 3.5 - 5.1 mmol/L 4.0 4.0 4.0  Chloride 98 - 111 mmol/L 98 100 102  CO2 22 - 32 mmol/L '24 27 28  ' Calcium 8.9 - 10.3 mg/dL 8.9 9.2 9.5  Total Protein 6.5 - 8.1 g/dL 7.7 7.3 6.6  Total Bilirubin 0.3 - 1.2 mg/dL 0.4 0.4 <0.2(L)  Alkaline Phos 38 - 126 U/L 141(H) 89 88  AST 15 - 41 U/L 49(H) 23 36  ALT 0 - 44 U/L '24 11 16    ' Lab Results  Component Value Date   WBC 2.5 (L) 10/27/2018   HGB  9.4 (L) 10/27/2018   HCT 29.0 (L) 10/27/2018   MCV 105.5 (H) 10/27/2018   PLT 339 10/27/2018   NEUTROABS 1.9 10/27/2018    ASSESSMENT & PLAN:  Malignant neoplasm of lower-outer quadrant of left breast of female, estrogen receptor positive ([facility]) Left breast cancer invasive ductal carcinoma 2.5 cm in size grade 1, ER 99%, PR 41%, Ki-67 11%, HER-2 negative T2 N0 M0 stage II a status post neoadjuvant chemotherapy followed by surgery February 2011and wason antiestrogen therapy with Femara from January 2011-July 2017  New onset mid back pain: MRI revealedT11 severe pathologic fracture, abnormal signal T10, T11 and T12, moderate canal stenosis at T11  12/02/2017: PET CT scan hypermetabolic lymphadenopathy in the thoracic and upper abdominal lymph nodes, spine and rib metastases, intramuscular metastases the left pectoralis muscle, T11 vertebral fracture pathologic  Biopsy of the T11 vertebral body ER 90%, PR 0%, Ki-67 10%, HER-2 -1+, foundation 1, PDL 1  Pathologic fracture T11: She met with orthopedics regarding kyphoplasty and they determined that there is no surgical options available for her. ------------------------------------------------------------------- Treatment : 1. Palliative radiation to T11 vertebral body and the pectoralis muscle: 12/20/17-12/26/17 2. Ibrance with letrozole along with Delton See or Zometa discontinued July 2020 due to progression Patient could not tolerate Faslodex because of intense bone pain as well as hypotension.  Severe shortness of breath with recurrent pleural effusions: Patient to have Pleurx catheter redone  CT CAP and bone scan 10/19/2018:Worsening left pleural effusion with pleural and pericardial metastases, new hilar lymph node and subcarinal lymph node metastases, new left adrenal metastases.  New T5 sclerotic partial compression fracture.  Small lesions in the liver.  Current treatment: Halaven cycle 1 day 1 Chemo counseling done Labs  reviewed Return to clinic in 1 week for toxicity check and for cycle 1 day 8 Port will be placed tomorrow. Monitoring closely for toxicities.    No orders of the defined types were placed in this encounter.  The patient has a good understanding of the overall plan. she agrees with it. she will call with any problems that may develop before the next visit here.  Nicholas Lose, MD 10/27/2018  Julious Oka Dorshimer am acting as scribe for Dr. Nicholas Lose.  I have reviewed the above documentation for accuracy and completeness, and I agree with the above.

## 2018-10-26 NOTE — Progress Notes (Addendum)
Patient presents to radiology today with non-functioning PleurX catheter.  She has been evaluated in IR recently for same when tPA was administered 6/26 with return of flow of pleural fluid on repeat examination 6/29.  However, since this visit she has noticed decreased volume of drainage and now is unable to express any fluid.  The patient states she presented to [facility] and underwent thoracentesis in the ED with ~500 mL removed via thoracentesis, however the removal was difficult with very slow drainage of fluid.    Today, Limited US Chest shows a large highly loculated pleural effusion which is not amenable to drainage. Due to the fact that patient was drained 3 days ago with improvement in symptoms, plan made today Dr. Anselm Pancoast to continue with PleurX removal today, consideration for PleurX placement on Wednesday IF she develops further progression of simple fluid by [place], possible thoracentesis.  This is explained to the patient thoroughly and she understands that a final plan will be made on Wednesday when she presents for possible Pleurx vs. Thoracentesis (if drainable fluid identified), and Port-A-Cath placement.   PleurX removed today by PA without complication.   Brynda Greathouse, MS RD PA-C 3:58 PM

## 2018-10-27 ENCOUNTER — Other Ambulatory Visit: Payer: Self-pay

## 2018-10-27 ENCOUNTER — Inpatient Hospital Stay: Payer: 59

## 2018-10-27 ENCOUNTER — Inpatient Hospital Stay (HOSPITAL_BASED_OUTPATIENT_CLINIC_OR_DEPARTMENT_OTHER): Payer: 59 | Admitting: Hematology and Oncology

## 2018-10-27 ENCOUNTER — Telehealth: Payer: Self-pay | Admitting: Hematology and Oncology

## 2018-10-27 ENCOUNTER — Other Ambulatory Visit: Payer: 59

## 2018-10-27 ENCOUNTER — Other Ambulatory Visit: Payer: Self-pay | Admitting: *Deleted

## 2018-10-27 ENCOUNTER — Other Ambulatory Visit: Payer: Self-pay | Admitting: Radiology

## 2018-10-27 ENCOUNTER — Ambulatory Visit (HOSPITAL_COMMUNITY): Payer: 59

## 2018-10-27 VITALS — HR 94

## 2018-10-27 DIAGNOSIS — J90 Pleural effusion, not elsewhere classified: Secondary | ICD-10-CM

## 2018-10-27 DIAGNOSIS — C7951 Secondary malignant neoplasm of bone: Secondary | ICD-10-CM

## 2018-10-27 DIAGNOSIS — C50512 Malignant neoplasm of lower-outer quadrant of left female breast: Secondary | ICD-10-CM

## 2018-10-27 DIAGNOSIS — R0602 Shortness of breath: Secondary | ICD-10-CM

## 2018-10-27 DIAGNOSIS — Z170 Estrogen receptor positive status [ER+]: Secondary | ICD-10-CM

## 2018-10-27 DIAGNOSIS — Z7189 Other specified counseling: Secondary | ICD-10-CM

## 2018-10-27 DIAGNOSIS — Z5111 Encounter for antineoplastic chemotherapy: Secondary | ICD-10-CM | POA: Diagnosis not present

## 2018-10-27 LAB — CMP (CANCER CENTER ONLY)
ALT: 17 U/L (ref 0–44)
AST: 26 U/L (ref 15–41)
Albumin: 2.1 g/dL — ABNORMAL LOW (ref 3.5–5.0)
Alkaline Phosphatase: 128 U/L — ABNORMAL HIGH (ref 38–126)
Anion gap: 13 (ref 5–15)
BUN: 12 mg/dL (ref 8–23)
CO2: 26 mmol/L (ref 22–32)
Calcium: 9.2 mg/dL (ref 8.9–10.3)
Chloride: 100 mmol/L (ref 98–111)
Creatinine: 1 mg/dL (ref 0.44–1.00)
GFR, Est AFR Am: >60 mL/min (ref >60)
GFR, Estimated: 59 mL/min — ABNORMAL LOW (ref >60)
Glucose, Bld: 129 mg/dL — ABNORMAL HIGH (ref 70–99)
Potassium: 3.9 mmol/L (ref 3.5–5.1)
Sodium: 139 mmol/L (ref 135–145)
Total Bilirubin: 0.4 mg/dL (ref 0.3–1.2)
Total Protein: 7.2 g/dL (ref 6.5–8.1)

## 2018-10-27 LAB — CBC WITH DIFFERENTIAL (CANCER CENTER ONLY)
Abs Immature Granulocytes: 0.03 10*3/uL (ref 0.00–0.07)
Basophils Absolute: 0 10*3/uL (ref 0.0–0.1)
Basophils Relative: 2 %
Eosinophils Absolute: 0 10*3/uL (ref 0.0–0.5)
Eosinophils Relative: 1 %
HCT: 29 % — ABNORMAL LOW (ref 36.0–46.0)
Hemoglobin: 9.4 g/dL — ABNORMAL LOW (ref 12.0–15.0)
Immature Granulocytes: 1 %
Lymphocytes Relative: 12 %
Lymphs Abs: 0.3 10*3/uL — ABNORMAL LOW (ref 0.7–4.0)
MCH: 34.2 pg — ABNORMAL HIGH (ref 26.0–34.0)
MCHC: 32.4 g/dL (ref 30.0–36.0)
MCV: 105.5 fL — ABNORMAL HIGH (ref 80.0–100.0)
Monocytes Absolute: 0.2 10*3/uL (ref 0.1–1.0)
Monocytes Relative: 7 %
Neutro Abs: 1.9 10*3/uL (ref 1.7–7.7)
Neutrophils Relative %: 77 %
Platelet Count: 339 10*3/uL (ref 150–400)
RBC: 2.75 MIL/uL — ABNORMAL LOW (ref 3.87–5.11)
RDW: 14.1 % (ref 11.5–15.5)
WBC Count: 2.5 10*3/uL — ABNORMAL LOW (ref 4.0–10.5)
nRBC: 0 % (ref 0.0–0.2)

## 2018-10-27 LAB — SARS CORONAVIRUS 2 (TAT 6-24 HRS): SARS Coronavirus 2: NEGATIVE

## 2018-10-27 MED ORDER — ZOLEDRONIC ACID 4 MG/5ML IV CONC
3.0000 mg | Freq: Once | INTRAVENOUS | Status: AC
  Administered 2018-10-27: 3 mg via INTRAVENOUS
  Filled 2018-10-27: qty 3.75

## 2018-10-27 MED ORDER — PROCHLORPERAZINE MALEATE 10 MG PO TABS
10.0000 mg | ORAL_TABLET | Freq: Once | ORAL | Status: AC
  Administered 2018-10-27: 09:00:00 10 mg via ORAL

## 2018-10-27 MED ORDER — SODIUM CHLORIDE 0.9 % IV SOLN: 1.4000 mg/m2 | Freq: Once | INTRAVENOUS | Status: DC

## 2018-10-27 MED ORDER — SODIUM CHLORIDE 0.9 % IV SOLN
1.1000 mg/m2 | Freq: Once | INTRAVENOUS | Status: AC
  Administered 2018-10-27: 1.65 mg via INTRAVENOUS
  Filled 2018-10-27: qty 3.3

## 2018-10-27 MED ORDER — PROCHLORPERAZINE MALEATE 10 MG PO TABS
ORAL_TABLET | ORAL | Status: AC
  Filled 2018-10-27: qty 1

## 2018-10-27 MED ORDER — SODIUM CHLORIDE 0.9 % IV SOLN
Freq: Once | INTRAVENOUS | Status: AC
  Administered 2018-10-27: 09:00:00 via INTRAVENOUS
  Filled 2018-10-27: qty 250

## 2018-10-27 NOTE — Assessment & Plan Note (Signed)
Left breast cancer invasive ductal carcinoma 2.5 cm in size grade 1, ER 99%, PR 41%, Ki-67 11%, HER-2 negative T2 N0 M0 stage II a status post neoadjuvant chemotherapy followed by surgery February 2011and wason antiestrogen therapy with Femara from January 2011-July 2017  New onset mid back pain: MRI revealedT11 severe pathologic fracture, abnormal signal T10, T11 and T12, moderate canal stenosis at T11  12/02/2017: PET CT scan hypermetabolic lymphadenopathy in the thoracic and upper abdominal lymph nodes, spine and rib metastases, intramuscular metastases the left pectoralis muscle, T11 vertebral fracture pathologic  Biopsy of the T11 vertebral body ER 90%, PR 0%, Ki-67 10%, HER-2 -1+, foundation 1, PDL 1  Pathologic fracture T11: She met with orthopedics regarding kyphoplasty and they determined that there is no surgical options available for her. ------------------------------------------------------------------- Treatment : 1. Palliative radiation to T11 vertebral body and the pectoralis muscle: 12/20/17-12/26/17 2. Ibrance with letrozole along with Delton See or Zometa discontinued July 2020 due to progression Patient could not tolerate Faslodex because of intense bone pain as well as hypotension.  Severe shortness of breath with recurrent pleural effusions: Patient to have Pleurx catheter redone  CT CAP and bone scan 10/19/2018:Worsening left pleural effusion with pleural and pericardial metastases, new hilar lymph node and subcarinal lymph node metastases, new left adrenal metastases.  New T5 sclerotic partial compression fracture.  Small lesions in the liver.  Current treatment: Halaven cycle 1 day 1 Chemo counseling done Labs reviewed Return to clinic in 1 week for toxicity check and for cycle 1 day 8 Monitoring closely for toxicities.

## 2018-10-27 NOTE — Telephone Encounter (Signed)
Faxed medical records to Sarita Bottom with [employer] @ [phone]

## 2018-10-27 NOTE — Progress Notes (Signed)
Spoke w/ Dr. Lindi Adie - dose reduce eribulin to 1.1 mg/m2 per PI for CrCL < 50 mL/min (CrCL ~45 today). Orders updated. She is also to continue zometa every 3 months, due today.   Demetrius Charity, PharmD, [facility] Phone: [phone] 10/27/2018

## 2018-10-27 NOTE — Progress Notes (Signed)
Pt O2 sat dropped to 88% while ambulating.  O2 recovered with 2L [place] to 96%.  Per Dr. Lindi Adie, place home health order for nursing and oxygen.  [place] Account Executive notified with [facility].

## 2018-10-27 NOTE — Progress Notes (Signed)
SATURATION QUALIFICATIONS: (This note is used to comply with regulatory documentation for home oxygen)  Patient Saturations on Room Air at Rest = 94%  Patient Saturations on Room Air while Ambulating = 88%  Patient Saturations on 2 Liters of oxygen while Ambulating = 96%  Please briefly explain why patient needs home oxygen: Patient experiences shortness of breath while ambulating with a decrease in oxygen saturations on room air.

## 2018-10-27 NOTE — Patient Instructions (Signed)
[facility] Discharge Instructions for Patients Receiving Chemotherapy  Today you received the following chemotherapy agents Havalen (Eribulin mesylate)  To help prevent nausea and vomiting after your treatment, we encourage you to take your nausea medication as directed.   If you develop nausea and vomiting that is not controlled by your nausea medication, call the clinic.   BELOW ARE SYMPTOMS THAT SHOULD BE REPORTED IMMEDIATELY:  *FEVER GREATER THAN 100.5 F  *CHILLS WITH OR WITHOUT FEVER  NAUSEA AND VOMITING THAT IS NOT CONTROLLED WITH YOUR NAUSEA MEDICATION  *UNUSUAL SHORTNESS OF BREATH  *UNUSUAL BRUISING OR BLEEDING  TENDERNESS IN MOUTH AND THROAT WITH OR WITHOUT PRESENCE OF ULCERS  *URINARY PROBLEMS  *BOWEL PROBLEMS  UNUSUAL RASH Items with * indicate a potential emergency and should be followed up as soon as possible.  Feel free to call the clinic should you have any questions or concerns. The clinic phone number is [phone].  Please show the [facility] at check-in to the Emergency Department and triage nurse.  Eribulin solution for injection (Havalen) What is this medicine? ERIBULIN (er e bu lin) is a chemotherapy drug. It is used to treat breast cancer and liposarcoma. This medicine may be used for other purposes; ask your health care provider or pharmacist if you have questions. COMMON BRAND NAME(S): Halaven What should I tell my health care provider before I take this medicine? They need to know if you have any of these conditions:  heart disease  history of irregular heartbeat  kidney disease  liver disease  low blood counts, like low white cell, platelet, or red cell counts  low levels of potassium or magnesium in the blood  an unusual or allergic reaction to eribulin, other medicines, foods, dyes, or preservatives  pregnant or trying to get pregnant  breast-feeding How should I use this medicine? This medicine is for  infusion into a vein. It is given by a health care professional in a hospital or clinic setting. Talk to your pediatrician regarding the use of this medicine in children. Special care may be needed. Overdosage: If you think you have taken too much of this medicine contact a poison control center or emergency room at once. NOTE: This medicine is only for you. Do not share this medicine with others. What if I miss a dose? It is important not to miss your dose. Call your doctor or health care professional if you are unable to keep an appointment. What may interact with this medicine? Do not take this medicine with any of the following medications:  amiodarone  astemizole  arsenic trioxide  bepridil  bretylium  chloroquine  chlorpromazine  cisapride  clarithromycin  dextromethorphan, quinidine  disopyramide  droperidol  dronedarone  erythromycin  grepafloxacin  halofantrine  haloperidol  ibutilide  levomethadyl  mesoridazine  methadone  pentamidine  procainamide  quinidine  pimozide  posaconazole  probucol  propafenone  saquinavir  sotalol  sparfloxacin  terfenadine  thioridazine  troleandomycin This medicine may also interact with the following medications:  dofetilide  ziprasidone This list may not describe all possible interactions. Give your health care provider a list of all the medicines, herbs, non-prescription drugs, or dietary supplements you use. Also tell them if you smoke, drink alcohol, or use illegal drugs. Some items may interact with your medicine. What should I watch for while using this medicine? This drug may make you feel generally unwell. This is not uncommon, as chemotherapy can affect healthy cells as well as  cancer cells. Report any side effects. Continue your course of treatment even though you feel ill unless your doctor tells you to stop. Call your doctor or health care professional for advice if you get a  fever, chills or sore throat, or other symptoms of a cold or flu. Do not treat yourself. This drug decreases your body's ability to fight infections. Try to avoid being around people who are sick. This medicine may increase your risk to bruise or bleed. Call your doctor or health care professional if you notice any unusual bleeding. You may need blood work done while you are taking this medicine. Do not become pregnant while taking this medicine or for 2 weeks after stopping it. Women should inform their doctor if they wish to become pregnant or think they might be pregnant. Men should not father a child while taking this medicine and for 3.5 months after stopping it. There is a potential for serious side effects to an unborn child. Talk to your health care professional or pharmacist for more information. Do not breast-feed an infant while taking this medicine or for 2 weeks after stopping it. What side effects may I notice from receiving this medicine? Side effects that you should report to your doctor or health care professional as soon as possible:  allergic reactions like skin rash, itching or hives, swelling of the face, lips, or tongue  low blood counts - this medicine may decrease the number of white blood cells, red blood cells and platelets. You may be at increased risk for infections and bleeding.  signs of infection - fever or chills, cough, sore throat, pain or difficulty passing urine  signs of decreased platelets or bleeding - bruising, pinpoint red spots on the skin, black, tarry stools, blood in the urine  signs of decreased red blood cells - unusually weak or tired, fainting spells, lightheadedness  pain, tingling, numbness in the hands or feet Side effects that usually do not require medical attention (report to your doctor or health care professional if they continue or are bothersome):  constipation  hair loss  headache  loss of appetite  muscle or joint  pain  nausea, vomiting  stomach pain This list may not describe all possible side effects. Call your doctor for medical advice about side effects. You may report side effects to FDA at 1-800-FDA-1088. Where should I keep my medicine? This drug is given in a hospital or clinic and will not be stored at home. NOTE: This sheet is a summary. It may not cover all possible information. If you have questions about this medicine, talk to your doctor, pharmacist, or health care provider.  2020 Elsevier/Gold Standard (2018-03-23 11:00:16)  Zoledronic Acid injection (Zometa) What is this medicine? ZOLEDRONIC ACID (ZOE le dron ik AS id) lowers the amount of calcium loss from bone. It is used to treat too much calcium in your blood from cancer. It is also used to prevent complications of cancer that has spread to the bone. This medicine may be used for other purposes; ask your health care provider or pharmacist if you have questions. COMMON BRAND NAME(S): Zometa What should I tell my health care provider before I take this medicine? They need to know if you have any of these conditions:  aspirin-sensitive asthma  cancer, especially if you are receiving medicines used to treat cancer  dental disease or wear dentures  infection  kidney disease  receiving corticosteroids like dexamethasone or prednisone  an unusual or allergic reaction to  zoledronic acid, other medicines, foods, dyes, or preservatives  pregnant or trying to get pregnant  breast-feeding How should I use this medicine? This medicine is for infusion into a vein. It is given by a health care professional in a hospital or clinic setting. Talk to your pediatrician regarding the use of this medicine in children. Special care may be needed. Overdosage: If you think you have taken too much of this medicine contact a poison control center or emergency room at once. NOTE: This medicine is only for you. Do not share this medicine with  others. What if I miss a dose? It is important not to miss your dose. Call your doctor or health care professional if you are unable to keep an appointment. What may interact with this medicine?  certain antibiotics given by injection  NSAIDs, medicines for pain and inflammation, like ibuprofen or naproxen  some diuretics like bumetanide, furosemide  teriparatide  thalidomide This list may not describe all possible interactions. Give your health care provider a list of all the medicines, herbs, non-prescription drugs, or dietary supplements you use. Also tell them if you smoke, drink alcohol, or use illegal drugs. Some items may interact with your medicine. What should I watch for while using this medicine? Visit your doctor or health care professional for regular checkups. It may be some time before you see the benefit from this medicine. Do not stop taking your medicine unless your doctor tells you to. Your doctor may order blood tests or other tests to see how you are doing. Women should inform their doctor if they wish to become pregnant or think they might be pregnant. There is a potential for serious side effects to an unborn child. Talk to your health care professional or pharmacist for more information. You should make sure that you get enough calcium and vitamin D while you are taking this medicine. Discuss the foods you eat and the vitamins you take with your health care professional. Some people who take this medicine have severe bone, joint, and/or muscle pain. This medicine may also increase your risk for jaw problems or a broken thigh bone. Tell your doctor right away if you have severe pain in your jaw, bones, joints, or muscles. Tell your doctor if you have any pain that does not go away or that gets worse. Tell your dentist and dental surgeon that you are taking this medicine. You should not have major dental surgery while on this medicine. See your dentist to have a dental exam  and fix any dental problems before starting this medicine. Take good care of your teeth while on this medicine. Make sure you see your dentist for regular follow-up appointments. What side effects may I notice from receiving this medicine? Side effects that you should report to your doctor or health care professional as soon as possible:  allergic reactions like skin rash, itching or hives, swelling of the face, lips, or tongue  anxiety, confusion, or depression  breathing problems  changes in vision  eye pain  feeling faint or lightheaded, falls  jaw pain, especially after dental work  mouth sores  muscle cramps, stiffness, or weakness  redness, blistering, peeling or loosening of the skin, including inside the mouth  trouble passing urine or change in the amount of urine Side effects that usually do not require medical attention (report to your doctor or health care professional if they continue or are bothersome):  bone, joint, or muscle pain  constipation  diarrhea  fever  hair loss  irritation at site where injected  loss of appetite  nausea, vomiting  stomach upset  trouble sleeping  trouble swallowing  weak or tired This list may not describe all possible side effects. Call your doctor for medical advice about side effects. You may report side effects to FDA at 1-800-FDA-1088. Where should I keep my medicine? This drug is given in a hospital or clinic and will not be stored at home. NOTE: This sheet is a summary. It may not cover all possible information. If you have questions about this medicine, talk to your doctor, pharmacist, or health care provider.  2020 Elsevier/Gold Standard (2013-08-28 14:19:39)

## 2018-10-28 ENCOUNTER — Other Ambulatory Visit (HOSPITAL_COMMUNITY): Payer: 59

## 2018-10-28 ENCOUNTER — Telehealth: Payer: Self-pay | Admitting: *Deleted

## 2018-10-28 ENCOUNTER — Ambulatory Visit (HOSPITAL_COMMUNITY)
Admission: RE | Admit: 2018-10-28 | Discharge: 2018-10-28 | Disposition: A | Discharge status: Home / Self Care | Payer: 59 | Source: Ambulatory Visit | Attending: Hematology and Oncology | Admitting: Hematology and Oncology

## 2018-10-28 ENCOUNTER — Other Ambulatory Visit: Payer: Self-pay | Admitting: Hematology and Oncology

## 2018-10-28 ENCOUNTER — Encounter (HOSPITAL_COMMUNITY): Payer: Self-pay

## 2018-10-28 ENCOUNTER — Other Ambulatory Visit: Payer: Self-pay

## 2018-10-28 DIAGNOSIS — Z170 Estrogen receptor positive status [ER+]: Secondary | ICD-10-CM | POA: Diagnosis present

## 2018-10-28 DIAGNOSIS — C50512 Malignant neoplasm of lower-outer quadrant of left female breast: Secondary | ICD-10-CM | POA: Insufficient documentation

## 2018-10-28 HISTORY — PX: IR IMAGING GUIDED PORT INSERTION: IMG5740

## 2018-10-28 LAB — CBC WITH DIFFERENTIAL/PLATELET
Abs Immature Granulocytes: 0.02 10*3/uL (ref 0.00–0.07)
Basophils Absolute: 0.1 10*3/uL (ref 0.0–0.1)
Basophils Relative: 2 %
Eosinophils Absolute: 0 10*3/uL (ref 0.0–0.5)
Eosinophils Relative: 1 %
HCT: 30.4 % — ABNORMAL LOW (ref 36.0–46.0)
Hemoglobin: 9.5 g/dL — ABNORMAL LOW (ref 12.0–15.0)
Immature Granulocytes: 1 %
Lymphocytes Relative: 10 %
Lymphs Abs: 0.3 10*3/uL — ABNORMAL LOW (ref 0.7–4.0)
MCH: 34.3 pg — ABNORMAL HIGH (ref 26.0–34.0)
MCHC: 31.3 g/dL (ref 30.0–36.0)
MCV: 109.7 fL — ABNORMAL HIGH (ref 80.0–100.0)
Monocytes Absolute: 0.1 10*3/uL (ref 0.1–1.0)
Monocytes Relative: 5 %
Neutro Abs: 2 10*3/uL (ref 1.7–7.7)
Neutrophils Relative %: 81 %
Platelets: 342 10*3/uL (ref 150–400)
RBC: 2.77 MIL/uL — ABNORMAL LOW (ref 3.87–5.11)
RDW: 14.2 % (ref 11.5–15.5)
WBC: 2.4 10*3/uL — ABNORMAL LOW (ref 4.0–10.5)
nRBC: 0 % (ref 0.0–0.2)

## 2018-10-28 LAB — PROTIME-INR
INR: 1.1 (ref 0.8–1.2)
Prothrombin Time: 14.5 seconds (ref 11.4–15.2)

## 2018-10-28 MED ORDER — MIDAZOLAM HCL 2 MG/2ML IJ SOLN
INTRAMUSCULAR | Status: AC | PRN
  Administered 2018-10-28: 1 mg via INTRAVENOUS

## 2018-10-28 MED ORDER — LIDOCAINE HCL (PF) 1 % IJ SOLN
INTRAMUSCULAR | Status: AC | PRN
  Administered 2018-10-28: 5 mL

## 2018-10-28 MED ORDER — CEFAZOLIN SODIUM-DEXTROSE 2-4 GM/100ML-% IV SOLN
INTRAVENOUS | Status: AC
  Administered 2018-10-28: 2 g via INTRAVENOUS
  Filled 2018-10-28: qty 100

## 2018-10-28 MED ORDER — FENTANYL CITRATE (PF) 100 MCG/2ML IJ SOLN
INTRAMUSCULAR | Status: AC | PRN
  Administered 2018-10-28: 50 ug via INTRAVENOUS

## 2018-10-28 MED ORDER — FENTANYL CITRATE (PF) 100 MCG/2ML IJ SOLN
INTRAMUSCULAR | Status: AC
  Filled 2018-10-28: qty 2

## 2018-10-28 MED ORDER — HEPARIN SOD (PORK) LOCK FLUSH 100 UNIT/ML IV SOLN
INTRAVENOUS | Status: AC | PRN
  Administered 2018-10-28: 500 [IU] via INTRAVENOUS

## 2018-10-28 MED ORDER — HEPARIN SOD (PORK) LOCK FLUSH 100 UNIT/ML IV SOLN
INTRAVENOUS | Status: AC
  Filled 2018-10-28: qty 5

## 2018-10-28 MED ORDER — SODIUM CHLORIDE 0.9 % IV SOLN
INTRAVENOUS | Status: DC
  Administered 2018-10-28: 13:00:00 via INTRAVENOUS

## 2018-10-28 MED ORDER — MIDAZOLAM HCL 2 MG/2ML IJ SOLN
INTRAMUSCULAR | Status: AC
  Filled 2018-10-28: qty 4

## 2018-10-28 MED ORDER — CEFAZOLIN SODIUM-DEXTROSE 2-4 GM/100ML-% IV SOLN
2.0000 g | INTRAVENOUS | Status: AC
  Administered 2018-10-28: 15:00:00 2 g via INTRAVENOUS

## 2018-10-28 MED ORDER — LIDOCAINE HCL 1 % IJ SOLN
INTRAMUSCULAR | Status: AC
  Filled 2018-10-28: qty 20

## 2018-10-28 NOTE — Procedures (Signed)
Interventional Radiology Procedure Note  Procedure: Single Lumen Power Port Placement    Access:  Right IJ vein.  Findings: Catheter tip positioned at SVC/RA junction. Port is ready for immediate use.   Complications: None  EBL: < 10 mL  Recommendations:  - Ok to shower in 24 hours - Do not submerge for 7 days - Routine line care   Avonell Lenig T. Joziyah Roblero, M.D Pager:  [phone]   

## 2018-10-28 NOTE — H&P (Signed)
Referring Physician(s): Nicholas Lose  Supervising Physician: Aletta Edouard  Patient Status:  [facility]  Chief Complaint: "I'm here for a port a cath and to look at the fluid in my chest"   Subjective: Patient familiar to IR service from T11 lesion biopsy on 12/11/2017, left thoracentesis on 06/17/2018, 07/15/2018, left Pleurx catheter placement on 08/05/2018 and removal on 10/26/2018. She has a history of metastatic breast cancer and recurrent, severely loculated left pleural effusion.  She presents again today for Port-A-Cath placement as well as reevaluation of left pleural effusion.  She denies fever, headache, chest pain, abdominal pain, nausea, vomiting or bleeding.  She does have some dyspnea although reports improvement today, occasional cough as well as back pain. She is COVID-19 negative.  Past Medical History:  Diagnosis Date   Arthritis    Breast cancer ([place]) 2010   Left   Chronic sinusitis    Family history of adverse reaction to anesthesia    sister-nausea/vomiting   History of chemotherapy 01/2009-03/2009   Hypercholesteremia    Hypothyroid    "inactive thyroid"   Metastasis ([place]) 11/2017   LN   Metastatic cancer to bone ([facility]) 11/2017   Migraines    RSD (reflex sympathetic dystrophy)    Past Surgical History:  Procedure Laterality Date   BREAST RECONSTRUCTION     EYE SURGERY Bilateral 7/18, 10/18   IR INSTILL VIA CHEST TUBE AGENT FOR FIBRINOLYSIS INI DAY  10/09/2018   IR PATIENT EVAL TECH 0-60 MINS  09/08/2018   IR PERC PLEURAL DRAIN W/INDWELL CATH W/IMG GUIDE  08/05/2018   IR RADIOLOGIST EVAL & MGMT  10/12/2018   IR REMOVAL OF PLURAL CATH W/CUFF  10/26/2018   KNEE ARTHROSCOPY Bilateral    KNEE SURGERY Right    Two surgeries   MASTECTOMY Bilateral 05/22/2009   MASTECTOMY Bilateral 2011   restrictions on left arm-no labs/bloodpressure   NASAL SINUS SURGERY     PORTA CATH INSERTION  12/2008   PORTA CATH REMOVAL  05/2009   ROOT CANAL      SHOULDER SURGERY Right    TOTAL HIP ARTHROPLASTY Left 05/26/2017   Procedure: TOTAL HIP ARTHROPLASTY ANTERIOR APPROACH;  Surgeon: Frederik Pear, MD;  Location: [facility];  Service: Orthopedics;  Laterality: Left;      Allergies: Shellfish allergy and Sulfa antibiotics  Medications: Prior to Admission medications   Medication Sig Start Date End Date Taking? Authorizing Provider  Biotin 10000 MCG TABS Take 10,000 mcg by mouth daily.    Yes [provider]  Cholecalciferol (VITAMIN D PO) Take 5,000 Units by mouth daily.    Yes [provider]  gabapentin (NEURONTIN) 300 MG capsule TAKE 1 CAPSULE BY MOUTH EVERYDAY AT BEDTIME 10/13/18  Yes Nicholas Lose, MD  levothyroxine (SYNTHROID, LEVOTHROID) 25 MCG tablet Take 25 mcg by mouth daily before breakfast.    Yes [provider]  Multiple Vitamin (MULTIVITAMIN) tablet Take 1 tablet by mouth daily.   Yes [provider]  Omega-3 Fatty Acids (SUPER OMEGA 3 EPA/DHA PO) Take 1 capsule by mouth daily.   Yes [provider]  omeprazole (PRILOSEC) 20 MG capsule Take 1 capsule (20 mg total) by mouth daily. 10/05/18  Yes Tanner, Lyndon Code., PA-C  ondansetron (ZOFRAN) 8 MG tablet Take 1 tablet (8 mg total) by mouth 2 (two) times daily as needed (Nausea or vomiting). 10/21/18  Yes Nicholas Lose, MD  oxyCODONE-acetaminophen (PERCOCET) 10-325 MG tablet Take 1 tablet by mouth every 8 (eight) hours as needed for pain.  08/17/18  Yes Nicholas Lose, MD  prednisoLONE acetate (PRED FORTE) 1 % ophthalmic suspension Place 1 drop into both eyes daily.   Yes [provider]  simethicone (GAS-X) 80 MG chewable tablet Chew 1 tablet (80 mg total) by mouth every 6 (six) hours as needed for flatulence. 10/05/18  Yes Tanner, Lyndon Code., PA-C  traMADol (ULTRAM) 50 MG tablet Take 1 tablet (50 mg total) by mouth every 6 (six) hours as needed. 10/21/18  Yes Nicholas Lose, MD  DULoxetine (CYMBALTA) 30 MG capsule TAKE 1 CAPSULE (30 MG TOTAL) BY MOUTH 2  (TWO) TIMES DAILY. 10/13/18   Nicholas Lose, MD  lidocaine-prilocaine (EMLA) cream Apply to affected area once 10/21/18   Nicholas Lose, MD  loperamide (IMODIUM) 2 MG capsule 1 to 2 PO QID prn diarrhea 10/05/18   Tanner, Lyndon Code., PA-C  LORazepam (ATIVAN) 0.5 MG tablet Take 1 tablet (0.5 mg total) by mouth every 6 (six) hours as needed (Nausea or vomiting). 10/21/18   Nicholas Lose, MD  magic mouthwash w/lidocaine SOLN Take 5 mLs by mouth 3 (three) times daily as needed for mouth pain. 05/25/18   Nicholas Lose, MD  prochlorperazine (COMPAZINE) 10 MG tablet Take 1 tablet (10 mg total) by mouth every 6 (six) hours as needed (Nausea or vomiting). 10/21/18   Nicholas Lose, MD  prochlorperazine (COMPAZINE) 5 MG tablet Take 1 tablet (5 mg total) by mouth every 6 (six) hours as needed for nausea or vomiting. 10/05/18   Harle Stanford., PA-C     Vital Signs: BP 114/80 (BP Location: Right Arm)    Pulse 91    Temp 97.8 F (36.6 C) (Oral)    Resp 16    SpO2 96%   Physical Exam awake, alert.  Chest with slightly diminished breath sounds left base, right clear.  Heart with regular rate /rhythm.  Abdomen soft, positive bowel sounds, gauze dressing over previous Pleurx cath insertion site left upper abdominal region, mildly tender to palpation.  No lower extremity edema  Imaging: Ir US Chest  Result Date: 10/26/2018 CLINICAL DATA:  63 year old with history of metastatic breast cancer with a tunneled left pleural catheter. Pleural catheter has not been functioning well despite having a large amount of pleural fluid on CT from 10/19/2018. EXAM: CHEST ULTRASOUND COMPARISON:  CT 10/19/2018 FINDINGS: Left pleural effusion is severely loculated. Innumerable small pockets of fluid throughout the left hemithorax. IMPRESSION: Severely loculated left pleural effusion. The loculated pleural fluid will make it difficult for percutaneous management of this effusion with either thoracentesis or tube placement. Electronically Signed   By:  Markus Daft M.D.   On: 10/26/2018 18:10   Ir Removal Of Plural Cath W/cuff  Result Date: 10/26/2018 INDICATION: Patient with history of recurrent left pleural effusion s/p PleurX catheter placement 08/05/18 by Dr. Anselm Pancoast. Patient with decreased function of the catheter over the past several weeks. Despite the use of fibrinolytics and tube manipulation, the catheter remains non-function. She presents for removal today. EXAM: REMOVAL OF TUNNELED PLEURAL CATHETER MEDICATIONS: 10 mL 1% lidocaine ANESTHESIA/SEDATION: None FLUOROSCOPY TIME:  None COMPLICATIONS: None immediate. PROCEDURE: Informed written consent was obtained from the patient following an explanation of the procedure, risks, benefits and alternatives to treatment. A time out was performed prior to the initiation of the procedure. Maximal barrier sterile technique was utilized including caps, mask, sterile gowns, sterile gloves, large sterile drape, hand hygiene, and cloraprep. 1% lidocaine with epinephrine was injected under sterile conditions along the subcutaneous tunnel. Utilizing a combination of  blunt dissection and gentle traction, the catheter was removed intact. Hemostasis was obtained with manual compression. A dressing was placed. The patient tolerated the procedure well without immediate post procedural complication. IMPRESSION: Successful removal of right tunneled PleurX catheter. Read by: Brynda Greathouse PA-C Electronically Signed   By: Markus Daft M.D.   On: 10/26/2018 15:53    Labs:  CBC: Recent Labs    10/05/18 1235 10/19/18 0852 10/23/18 1659 10/27/18 0741  WBC 2.4* 3.7* 3.2* 2.5*  HGB 10.6* 10.2* 9.2* 9.4*  HCT 32.4* 30.8* 27.9* 29.0*  PLT 273 411* 383 339    COAGS: Recent Labs    12/11/17 0711 08/05/18 0820  INR 0.94 0.9    BMP: Recent Labs    10/05/18 1235 10/19/18 0852 10/23/18 1659 10/27/18 0741  NA 138 138 135 139  K 4.0 4.0 4.0 3.9  CL 102 100 98 100  CO2 28 27 24 26   GLUCOSE 130* 123* 130* 129*    BUN 13 10 13 12   CALCIUM 9.5 9.2 8.9 9.2  CREATININE 0.91 1.05* 0.91 1.00  GFRNONAA >60 56* >60 59*  GFRAA >60 >60 >60 >60    LIVER FUNCTION TESTS: Recent Labs    10/05/18 1235 10/19/18 0852 10/23/18 1659 10/27/18 0741  BILITOT <0.2* 0.4 0.4 0.4  AST 36 23 49* 26  ALT 16 11 24 17   ALKPHOS 88 89 141* 128*  PROT 6.6 7.3 7.7 7.2  ALBUMIN 2.7* 2.4* 2.8* 2.1*    Assessment and Plan: Pt with history of metastatic breast cancer and recurrent, severely loculated left pleural effusion; status post removal of left Pleurx catheter on 10/26/2018 due to poor function in setting of loculated pleural fluid ; she presents again today for Port-A-Cath placement for chemotherapy as well as reevaluation of left pleural effusion to see if thoracentesis versus new Pleurx indicated.Risks and benefits of image guided port-a-catheter placement was discussed with the patient including, but not limited to bleeding, infection, pneumothorax, or fibrin sheath development and need for additional procedures. She is COVID-19 neg.  All of the patient's questions were answered, patient is agreeable to proceed. Consent signed and in chart.     Electronically Signed: D. Rowe Robert, PA-C 10/28/2018, 1:16 PM   I spent a total of 25 minutes at the the patient's bedside AND on the patient's hospital floor or unit, greater than 50% of which was counseling/coordinating care for Port-A-Cath placement

## 2018-10-28 NOTE — Telephone Encounter (Signed)
RN placed call to follow after patient received first time chemotherapy.  Patient doing well.  Pt denies nausea, and diarrhea.  Tolerating fluids well.  All questions were answered.  No further needs.    

## 2018-10-28 NOTE — Discharge Instructions (Signed)
NO EMLA Cream for 2 weeks.    Implanted Port Insertion, Care After This sheet gives you information about how to care for yourself after your procedure. Your health care provider may also give you more specific instructions. If you have problems or questions, contact your health care provider. What can I expect after the procedure? After the procedure, it is common to have:  Discomfort at the port insertion site.  Bruising on the skin over the port. This should improve over 3-4 days. Follow these instructions at home: [facility] care  After your port is placed, you will get a manufacturer's information card. The card has information about your port. Keep this card with you at all times.  Take care of the port as told by your health care provider. Ask your health care provider if you or a family member can get training for taking care of the port at home. A home health care nurse may also take care of the port.  Make sure to remember what type of port you have. Incision care      Follow instructions from your health care provider about how to take care of your port insertion site. Make sure you: ? Wash your hands with soap and water before and after you change your bandage (dressing). If soap and water are not available, use hand sanitizer. ? Change your dressing as told by your health care provider. ? Leave stitches (sutures), skin glue, or adhesive strips in place. These skin closures may need to stay in place for 2 weeks or longer. If adhesive strip edges start to loosen and curl up, you may trim the loose edges. Do not remove adhesive strips completely unless your health care provider tells you to do that.  Check your port insertion site every day for signs of infection. Check for: ? Redness, swelling, or pain. ? Fluid or blood. ? Warmth. ? Pus or a bad smell. Activity  Return to your normal activities as told by your health care provider. Ask your health care provider what  activities are safe for you.  Do not lift anything that is heavier than 10 lb (4.5 kg), or the limit that you are told, until your health care provider says that it is safe. General instructions  Take over-the-counter and prescription medicines only as told by your health care provider.  Do not take baths, swim, or use a hot tub until your health care provider approves. Ask your health care provider if you may take showers. You may only be allowed to take sponge baths.  Do not drive for 24 hours if you were given a sedative during your procedure.  Wear a medical alert bracelet in case of an emergency. This will tell any health care providers that you have a port.  Keep all follow-up visits as told by your health care provider. This is important. Contact a health care provider if:  You cannot flush your port with saline as directed, or you cannot draw blood from the port.  You have a fever or chills.  You have redness, swelling, or pain around your port insertion site.  You have fluid or blood coming from your port insertion site.  Your port insertion site feels warm to the touch.  You have pus or a bad smell coming from the port insertion site. Get help right away if:  You have chest pain or shortness of breath.  You have bleeding from your port that you cannot control. Summary  Take  care of the port as told by your health care provider. Keep the manufacturer's information card with you at all times.  Change your dressing as told by your health care provider.  Contact a health care provider if you have a fever or chills or if you have redness, swelling, or pain around your port insertion site.  Keep all follow-up visits as told by your health care provider. This information is not intended to replace advice given to you by your health care provider. Make sure you discuss any questions you have with your health care provider. Document Released: 01/20/2013 Document Revised:  10/28/2017 Document Reviewed: 10/28/2017 Elsevier Patient Education  [place].    Moderate Conscious Sedation, Adult, Care After These instructions provide you with information about caring for yourself after your procedure. Your health care provider may also give you more specific instructions. Your treatment has been planned according to current medical practices, but problems sometimes occur. Call your health care provider if you have any problems or questions after your procedure. What can I expect after the procedure? After your procedure, it is common:  To feel sleepy for several hours.  To feel clumsy and have poor balance for several hours.  To have poor judgment for several hours.  To vomit if you eat too soon. Follow these instructions at home: For at least 24 hours after the procedure:   Do not: ? Participate in activities where you could fall or become injured. ? Drive. ? Use heavy machinery. ? Drink alcohol. ? Take sleeping pills or medicines that cause drowsiness. ? Make important decisions or sign legal documents. ? Take care of children on your own.  Rest. Eating and drinking  Follow the diet recommended by your health care provider.  If you vomit: ? Drink water, juice, or soup when you can drink without vomiting. ? Make sure you have little or no nausea before eating solid foods. General instructions  Have a responsible adult stay with you until you are awake and alert.  Take over-the-counter and prescription medicines only as told by your health care provider.  If you smoke, do not smoke without supervision.  Keep all follow-up visits as told by your health care provider. This is important. Contact a health care provider if:  You keep feeling nauseous or you keep vomiting.  You feel light-headed.  You develop a rash.  You have a fever. Get help right away if:  You have trouble breathing. This information is not intended to replace  advice given to you by your health care provider. Make sure you discuss any questions you have with your health care provider. Document Released: 01/20/2013 Document Revised: 03/14/2017 Document Reviewed: 07/22/2015 Elsevier Patient Education  2020 [employer].

## 2018-10-29 ENCOUNTER — Telehealth: Payer: Self-pay | Admitting: Hematology and Oncology

## 2018-10-29 NOTE — Telephone Encounter (Signed)
I talk with patient regarding schedule  

## 2018-11-02 ENCOUNTER — Ambulatory Visit (HOSPITAL_COMMUNITY): Admission: RE | Admit: 2018-11-02 | Payer: 59 | Source: Ambulatory Visit

## 2018-11-03 ENCOUNTER — Inpatient Hospital Stay: Payer: 59

## 2018-11-03 ENCOUNTER — Other Ambulatory Visit: Payer: Self-pay | Admitting: Hematology and Oncology

## 2018-11-03 ENCOUNTER — Other Ambulatory Visit: Payer: Self-pay | Admitting: *Deleted

## 2018-11-03 ENCOUNTER — Other Ambulatory Visit: Payer: Self-pay

## 2018-11-03 VITALS — BP 118/70 | HR 100 | Temp 98.6°F | Resp 24 | Ht 63.5 in | Wt 112.5 lb

## 2018-11-03 DIAGNOSIS — Z170 Estrogen receptor positive status [ER+]: Secondary | ICD-10-CM

## 2018-11-03 DIAGNOSIS — C50512 Malignant neoplasm of lower-outer quadrant of left female breast: Secondary | ICD-10-CM

## 2018-11-03 DIAGNOSIS — Z5111 Encounter for antineoplastic chemotherapy: Secondary | ICD-10-CM | POA: Diagnosis not present

## 2018-11-03 DIAGNOSIS — Z7189 Other specified counseling: Secondary | ICD-10-CM

## 2018-11-03 LAB — CBC WITH DIFFERENTIAL (CANCER CENTER ONLY)
Abs Immature Granulocytes: 0.01 10*3/uL (ref 0.00–0.07)
Basophils Absolute: 0.1 10*3/uL (ref 0.0–0.1)
Basophils Relative: 2 %
Eosinophils Absolute: 0 10*3/uL (ref 0.0–0.5)
Eosinophils Relative: 0 %
HCT: 23.7 % — ABNORMAL LOW (ref 36.0–46.0)
Hemoglobin: 7.8 g/dL — ABNORMAL LOW (ref 12.0–15.0)
Immature Granulocytes: 0 %
Lymphocytes Relative: 13 %
Lymphs Abs: 0.3 10*3/uL — ABNORMAL LOW (ref 0.7–4.0)
MCH: 34.2 pg — ABNORMAL HIGH (ref 26.0–34.0)
MCHC: 32.9 g/dL (ref 30.0–36.0)
MCV: 103.9 fL — ABNORMAL HIGH (ref 80.0–100.0)
Monocytes Absolute: 0.5 10*3/uL (ref 0.1–1.0)
Monocytes Relative: 21 %
Neutro Abs: 1.4 10*3/uL — ABNORMAL LOW (ref 1.7–7.7)
Neutrophils Relative %: 64 %
Platelet Count: 324 10*3/uL (ref 150–400)
RBC: 2.28 MIL/uL — ABNORMAL LOW (ref 3.87–5.11)
RDW: 14.5 % (ref 11.5–15.5)
WBC Count: 2.2 10*3/uL — ABNORMAL LOW (ref 4.0–10.5)
nRBC: 0 % (ref 0.0–0.2)

## 2018-11-03 LAB — CMP (CANCER CENTER ONLY)
ALT: 12 U/L (ref 0–44)
AST: 39 U/L (ref 15–41)
Albumin: 1.9 g/dL — ABNORMAL LOW (ref 3.5–5.0)
Alkaline Phosphatase: 104 U/L (ref 38–126)
Anion gap: 12 (ref 5–15)
BUN: 8 mg/dL (ref 8–23)
CO2: 24 mmol/L (ref 22–32)
Calcium: 8.5 mg/dL — ABNORMAL LOW (ref 8.9–10.3)
Chloride: 103 mmol/L (ref 98–111)
Creatinine: 0.74 mg/dL (ref 0.44–1.00)
GFR, Est AFR Am: >60 mL/min (ref >60)
GFR, Estimated: >60 mL/min (ref >60)
Glucose, Bld: 138 mg/dL — ABNORMAL HIGH (ref 70–99)
Potassium: 3.7 mmol/L (ref 3.5–5.1)
Sodium: 139 mmol/L (ref 135–145)
Total Bilirubin: <0.2 mg/dL — ABNORMAL LOW (ref 0.3–1.2)
Total Protein: 6.5 g/dL (ref 6.5–8.1)

## 2018-11-03 LAB — PREPARE RBC (CROSSMATCH)

## 2018-11-03 LAB — ABO/RH: ABO/RH(D): O POS

## 2018-11-03 MED ORDER — SODIUM CHLORIDE 0.9% IV SOLUTION
250.0000 mL | Freq: Once | INTRAVENOUS | Status: AC
  Administered 2018-11-03: 11:00:00 250 mL via INTRAVENOUS
  Filled 2018-11-03: qty 250

## 2018-11-03 MED ORDER — SODIUM CHLORIDE 0.9 % IV SOLN
1.1000 mg/m2 | Freq: Once | INTRAVENOUS | Status: AC
  Administered 2018-11-03: 1.65 mg via INTRAVENOUS
  Filled 2018-11-03: qty 3.3

## 2018-11-03 MED ORDER — SODIUM CHLORIDE 0.9 % IV SOLN
Freq: Once | INTRAVENOUS | Status: AC
  Administered 2018-11-03: 10:00:00 via INTRAVENOUS
  Filled 2018-11-03: qty 250

## 2018-11-03 MED ORDER — DIPHENHYDRAMINE HCL 25 MG PO CAPS
25.0000 mg | ORAL_CAPSULE | Freq: Once | ORAL | Status: AC
  Administered 2018-11-03: 25 mg via ORAL

## 2018-11-03 MED ORDER — SODIUM CHLORIDE 0.9% FLUSH
10.0000 mL | INTRAVENOUS | Status: DC | PRN
  Administered 2018-11-03: 10 mL
  Filled 2018-11-03: qty 10

## 2018-11-03 MED ORDER — SODIUM CHLORIDE 0.9% FLUSH
10.0000 mL | Freq: Once | INTRAVENOUS | Status: AC | PRN
  Administered 2018-11-03: 10 mL
  Filled 2018-11-03: qty 10

## 2018-11-03 MED ORDER — HEPARIN SOD (PORK) LOCK FLUSH 100 UNIT/ML IV SOLN
500.0000 [IU] | Freq: Once | INTRAVENOUS | Status: AC | PRN
  Administered 2018-11-03: 14:00:00 500 [IU]
  Filled 2018-11-03: qty 5

## 2018-11-03 MED ORDER — PROCHLORPERAZINE MALEATE 10 MG PO TABS
10.0000 mg | ORAL_TABLET | Freq: Once | ORAL | Status: AC
  Administered 2018-11-03: 10 mg via ORAL

## 2018-11-03 MED ORDER — PROCHLORPERAZINE MALEATE 10 MG PO TABS
ORAL_TABLET | ORAL | Status: AC
  Filled 2018-11-03: qty 1

## 2018-11-03 MED ORDER — ACETAMINOPHEN 325 MG PO TABS
650.0000 mg | ORAL_TABLET | Freq: Once | ORAL | Status: AC
  Administered 2018-11-03: 650 mg via ORAL

## 2018-11-03 MED ORDER — SODIUM CHLORIDE 0.9% FLUSH
3.0000 mL | INTRAVENOUS | Status: DC | PRN
  Filled 2018-11-03: qty 10

## 2018-11-03 NOTE — Patient Instructions (Addendum)
[facility] Discharge Instructions for Patients Receiving Chemotherapy  Today you received the following chemotherapy agents [place]  To help prevent nausea and vomiting after your treatment, we encourage you to take your nausea medication as directed by your MD.   If you develop nausea and vomiting that is not controlled by your nausea medication, call the clinic.   BELOW ARE SYMPTOMS THAT SHOULD BE REPORTED IMMEDIATELY:  *FEVER GREATER THAN 100.5 F  *CHILLS WITH OR WITHOUT FEVER  NAUSEA AND VOMITING THAT IS NOT CONTROLLED WITH YOUR NAUSEA MEDICATION  *UNUSUAL SHORTNESS OF BREATH  *UNUSUAL BRUISING OR BLEEDING  TENDERNESS IN MOUTH AND THROAT WITH OR WITHOUT PRESENCE OF ULCERS  *URINARY PROBLEMS  *BOWEL PROBLEMS  UNUSUAL RASH Items with * indicate a potential emergency and should be followed up as soon as possible.  Feel free to call the clinic should you have any questions or concerns. The clinic phone number is [phone].  Please show the [facility] at check-in to the Emergency Department and triage nurse.  .cEribulin solution for injection What is this medicine? ERIBULIN (er e bu lin) is a chemotherapy drug. It is used to treat breast cancer and liposarcoma. This medicine may be used for other purposes; ask your health care provider or pharmacist if you have questions. COMMON BRAND NAME(S): Hal[place] What should I tell my health care provider before I take this medicine? They need to know if you have any of these conditions:  heart disease  history of irregular heartbeat  kidney disease  liver disease  low blood counts, like low white cell, platelet, or red cell counts  low levels of potassium or magnesium in the blood  an unusual or allergic reaction to eribulin, other medicines, foods, dyes, or preservatives  pregnant or trying to get pregnant  breast-feeding How should I use this medicine? This medicine is for infusion into a  vein. It is given by a health care professional in a hospital or clinic setting. Talk to your pediatrician regarding the use of this medicine in children. Special care may be needed. Overdosage: If you think you have taken too much of this medicine contact a poison control center or emergency room at once. NOTE: This medicine is only for you. Do not share this medicine with others. What if I miss a dose? It is important not to miss your dose. Call your doctor or health care professional if you are unable to keep an appointment. What may interact with this medicine? Do not take this medicine with any of the following medications:  amiodarone  astemizole  arsenic trioxide  bepridil  bretylium  chloroquine  chlorpromazine  cisapride  clarithromycin  dextromethorphan, quinidine  disopyramide  droperidol  dronedarone  erythromycin  grepafloxacin  halofantrine  haloperidol  ibutilide  levomethadyl  mesoridazine  methadone  pentamidine  procainamide  quinidine  pimozide  posaconazole  probucol  propafenone  saquinavir  sotalol  sparfloxacin  terfenadine  thioridazine  troleandomycin This medicine may also interact with the following medications:  dofetilide  ziprasidone This list may not describe all possible interactions. Give your health care provider a list of all the medicines, herbs, non-prescription drugs, or dietary supplements you use. Also tell them if you smoke, drink alcohol, or use illegal drugs. Some items may interact with your medicine. What should I watch for while using this medicine? This drug may make you feel generally unwell. This is not uncommon, as chemotherapy can affect healthy cells as well as  cancer cells. Report any side effects. Continue your course of treatment even though you feel ill unless your doctor tells you to stop. Call your doctor or health care professional for advice if you get a fever, chills or  sore throat, or other symptoms of a cold or flu. Do not treat yourself. This drug decreases your body's ability to fight infections. Try to avoid being around people who are sick. This medicine may increase your risk to bruise or bleed. Call your doctor or health care professional if you notice any unusual bleeding. You may need blood work done while you are taking this medicine. Do not become pregnant while taking this medicine or for 2 weeks after stopping it. Women should inform their doctor if they wish to become pregnant or think they might be pregnant. Men should not father a child while taking this medicine and for 3.5 months after stopping it. There is a potential for serious side effects to an unborn child. Talk to your health care professional or pharmacist for more information. Do not breast-feed an infant while taking this medicine or for 2 weeks after stopping it. What side effects may I notice from receiving this medicine? Side effects that you should report to your doctor or health care professional as soon as possible:  allergic reactions like skin rash, itching or hives, swelling of the face, lips, or tongue  low blood counts - this medicine may decrease the number of white blood cells, red blood cells and platelets. You may be at increased risk for infections and bleeding.  signs of infection - fever or chills, cough, sore throat, pain or difficulty passing urine  signs of decreased platelets or bleeding - bruising, pinpoint red spots on the skin, black, tarry stools, blood in the urine  signs of decreased red blood cells - unusually weak or tired, fainting spells, lightheadedness  pain, tingling, numbness in the hands or feet Side effects that usually do not require medical attention (report to your doctor or health care professional if they continue or are bothersome):  constipation  hair loss  headache  loss of appetite  muscle or joint pain  nausea,  vomiting  stomach pain This list may not describe all possible side effects. Call your doctor for medical advice about side effects. You may report side effects to FDA at 1-800-FDA-1088. Where should I keep my medicine? This drug is given in a hospital or clinic and will not be stored at home. NOTE: This sheet is a summary. It may not cover all possible information. If you have questions about this medicine, talk to your doctor, pharmacist, or health care provider.  2020 Elsevier/Gold Standard (2018-03-23 11:00:16)   Blood Transfusion, Adult, Care After This sheet gives you information about how to care for yourself after your procedure. Your doctor may also give you more specific instructions. If you have problems or questions, contact your doctor. Follow these instructions at home:   Take over-the-counter and prescription medicines only as told by your doctor.  Go back to your normal activities as told by your doctor.  Follow instructions from your doctor about how to take care of the area where an IV tube was put into your vein (insertion site). Make sure you: ? Wash your hands with soap and water before you change your bandage (dressing). If there is no soap and water, use hand sanitizer. ? Change your bandage as told by your doctor.  Check your IV insertion site every day for signs  of infection. Check for: ? More redness, swelling, or pain. ? More fluid or blood. ? Warmth. ? Pus or a bad smell. Contact a doctor if:  You have more redness, swelling, or pain around the IV insertion site.  You have more fluid or blood coming from the IV insertion site.  Your IV insertion site feels warm to the touch.  You have pus or a bad smell coming from the IV insertion site.  Your pee (urine) turns pink, red, or brown.  You feel weak after doing your normal activities. Get help right away if:  You have signs of a serious allergic or body defense (immune) system reaction,  including: ? Itchiness. ? Hives. ? Trouble breathing. ? Anxiety. ? Pain in your chest or lower back. ? Fever, flushing, and chills. ? Fast pulse. ? Rash. ? Watery poop (diarrhea). ? Throwing up (vomiting). ? Dark pee. ? Serious headache. ? Dizziness. ? Stiff neck. ? Yellow color in your face or the white parts of your eyes (jaundice). Summary  After a blood transfusion, return to your normal activities as told by your doctor.  Every day, check for signs of infection where the IV tube was put into your vein.  Some signs of infection are warm skin, more redness and pain, more fluid or blood, and pus or a bad smell where the needle went in.  Contact your doctor if you feel weak or have any unusual symptoms. This information is not intended to replace advice given to you by your health care provider. Make sure you discuss any questions you have with your health care provider. Document Released: 04/22/2014 Document Revised: 08/06/2017 Document Reviewed: 11/24/2015 Elsevier Patient Education  2020 [place] (COVID-19) Are you at risk?  Are you at risk for the Coronavirus (COVID-19)?  To be considered HIGH RISK for Coronavirus (COVID-19), you have to meet the following criteria:  . Traveled to [place], [place], [place], [place] or [place]; or in the [place] to [place], [place], [place], or [place]; and have fever, cough, and shortness of breath within the last 2 weeks of travel OR . Been in close contact with a person diagnosed with COVID-19 within the last 2 weeks and have fever, cough, and shortness of breath . IF YOU DO NOT MEET THESE CRITERIA, YOU ARE CONSIDERED LOW RISK FOR COVID-19.  What to do if you are HIGH RISK for COVID-19?  Marland Kitchen If you are having a medical emergency, call 911. . Seek medical care right away. Before you go to a doctor's office, urgent care or emergency department, call ahead and tell them about your recent travel, contact  with someone diagnosed with COVID-19, and your symptoms. You should receive instructions from your physician's office regarding next steps of care.  . When you arrive at healthcare provider, tell the healthcare staff immediately you have returned from visiting [place], [place], [place], [place] or [place]; or traveled in the [place] to [place], [place], [place], or [place]; in the last two weeks or you have been in close contact with a person diagnosed with COVID-19 in the last 2 weeks.   . Tell the health care staff about your symptoms: fever, cough and shortness of breath. . After you have been seen by a medical provider, you will be either: o Tested for (COVID-19) and discharged home on quarantine except to seek medical care if symptoms worsen, and asked to  - Stay home and avoid contact  with others until you get your results (4-5 days)  - Avoid travel on public transportation if possible (such as bus, train, or airplane) or o Sent to the Emergency Department by EMS for evaluation, COVID-19 testing, and possible admission depending on your condition and test results.  What to do if you are LOW RISK for COVID-19?  Reduce your risk of any infection by using the same precautions used for avoiding the common cold or flu:  Marland Kitchen Wash your hands often with soap and warm water for at least 20 seconds.  If soap and water are not readily available, use an alcohol-based hand sanitizer with at least 60% alcohol.  . If coughing or sneezing, cover your mouth and nose by coughing or sneezing into the elbow areas of your shirt or coat, into a tissue or into your sleeve (not your hands). . Avoid shaking hands with others and consider head nods or verbal greetings only. . Avoid touching your eyes, nose, or mouth with unwashed hands.  . Avoid close contact with people who are sick. . Avoid places or events with large numbers of people in one location, like concerts or sporting events. . Carefully consider  travel plans you have or are making. . If you are planning any travel outside or inside the [place], visit the CDC's Travelers' Health webpage for the latest health notices. . If you have some symptoms but not all symptoms, continue to monitor at home and seek medical attention if your symptoms worsen. . If you are having a medical emergency, call 911.   [place] / e-Visit: [email or web address]         [facility]: [facility]: [phone]                   [facility]: [phone]

## 2018-11-03 NOTE — Patient Instructions (Signed)

## 2018-11-03 NOTE — Progress Notes (Signed)
Per Dr. Lindi Adie ok to treat with [facility] today with Hemoglobin 7.8, ANC 1.4, and Heart Rate = 102. Will add an order for 1 unit of Packed Red Blood Cells for transfusion.

## 2018-11-04 LAB — TYPE AND SCREEN
ABO/RH(D): O POS
Antibody Screen: NEGATIVE
Unit division: 0

## 2018-11-04 LAB — BPAM RBC
Blood Product Expiration Date: 202008172359
ISSUE DATE / TIME: 202007211125
Unit Type and Rh: 5100

## 2018-11-06 ENCOUNTER — Other Ambulatory Visit: Payer: Self-pay | Admitting: Hematology and Oncology

## 2018-11-06 MED ORDER — OXYCODONE-ACETAMINOPHEN 10-325 MG PO TABS: 1.0000 | ORAL_TABLET | Freq: Three times a day (TID) | ORAL | 0 refills | Status: DC | PRN

## 2018-11-06 NOTE — Progress Notes (Signed)
HEMATOLOGY-ONCOLOGY DOXIMITY VISIT PROGRESS NOTE  I connected with Elizabeth [name] on 11/09/2018 at  3:15 PM EDT by Doximity video conference and verified that I am speaking with the correct person using two identifiers.  I discussed the limitations, risks, security and privacy concerns of performing an evaluation and management service by Doximity and the availability of in person appointments.  I also discussed with the patient that there may be a patient responsible charge related to this service. The patient expressed understanding and agreed to proceed.  Patient's Location: Home Physician Location: Clinic  CHIEF COMPLIANT: Follow-up of metastatic breast cancer to review recent scans  INTERVAL HISTORY: Elizabeth [name] is a 63 y.o. female with above-mentioned history of metastatic breast cancer who has had recurrent pleural effusions. She is currently on treatment with Halaven. She presents over Doximity today for follow-up to review recent scans.  She continues to be severely short of breath.  She wanted her family to listen to the findings on the scans.  Oncology History  Breast cancer of lower-outer quadrant of left female breast ([place])  12/13/2008 Initial Diagnosis   Left breast biopsy: Invasive ductal carcinoma ER 90% percent, PR 41%, Ki-67 11%, HER-2 negative ratio 1, Oncotype DX score 23, ROR15%   01/27/2009 - 03/10/2009 Neo-Adjuvant Chemotherapy   Neoadjuvant FEC 4; participant in the "bed sheets" study.   05/12/2009 -  Anti-estrogen oral therapy   Femara 2.5 mg daily   05/22/2009 Surgery   Bilateral mastectomy stage IIB T2 N0 M0 IDC left breast grade 1, 2.5 cm, ER 99%, PR 41%, Ki-67 11%, HER-2 negative   12/10/2009 Surgery   Breast reconstruction surgery   11/14/2017 Relapse/Recurrence   Mid back pain: MRI revealed T11 severe pathologic fracture, abnormal signal T10, T11 and T12, moderate canal stenosis at T11   12/03/2017 PET scan   Hypermetabolic metastatic breast cancer  involving thoracic and upper abdominal lymph nodes, spine and ribs. There may be hypermetabolic adenopathy in the left neck; Hypermetabolic lymph node versus intramuscular metastasis involving the medial left pectoralis musculature Pathologic T11 fracture     12/11/2017 Procedure   Biopsy of T11 vertebral body lytic lesion: Metastatic breast adenocarcinoma ER positive, PR negative   12/19/2017 - 12/26/2017 Radiation Therapy   Palliative XRT to T 11   01/12/2018 -  Anti-estrogen oral therapy   Patient could not tolerate Faslodex because of severe hypotension and severe pain in the legs after injections.  Ibrance with letrozole starting 01/12/2018   Malignant neoplasm of lower-outer quadrant of left breast of female, estrogen receptor positive ([place])  10/21/2018 Initial Diagnosis   Malignant neoplasm of lower-outer quadrant of left breast of female, estrogen receptor positive ([place])   10/27/2018 -  Chemotherapy   The patient had eriBULin mesylate (HALAVEN) 1.65 mg in sodium chloride 0.9 % 100 mL chemo infusion, 1.1 mg/m2 = 2.1 mg, Intravenous,  Once, 1 of 4 cycles Dose modification: 1.1 mg/m2 (original dose 1.4 mg/m2, Cycle 1, Reason: Other (see comments), Comment: CrCL < 50) Administration: 1.65 mg (10/27/2018), 1.65 mg (11/03/2018)  for chemotherapy treatment.      REVIEW OF SYSTEMS:   Constitutional: Denies fevers, chills or abnormal weight loss Eyes: Denies blurriness of vision Ears, nose, mouth, throat, and face: Denies mucositis or sore throat Respiratory: Severe shortness of breath and fatigue Cardiovascular: Denies palpitation, chest discomfort Gastrointestinal:  Denies nausea, heartburn or change in bowel habits Skin: Denies abnormal skin rashes Lymphatics: Denies new lymphadenopathy or easy bruising Neurological:Denies numbness, tingling or new weaknesses  Behavioral/Psych: Mood is stable, no new changes  Extremities: No lower extremity edema Breast: denies any pain or lumps or  nodules in either breasts All other systems were reviewed with the patient and are negative.  Observations/Objective:  There were no vitals filed for this visit. There is no height or weight on file to calculate BMI.  I have reviewed the data as listed CMP Latest Ref Rng & Units 11/03/2018 10/27/2018 10/23/2018  Glucose 70 - 99 mg/dL 138(H) 129(H) 130(H)  BUN 8 - 23 mg/dL _0 Creatinine 0.44 - 1.00 mg/dL 0.74 1.00 0.91  Sodium 135 - 145 mmol/L 139 139 135  Potassium 3.5 - 5.1 mmol/L 3.7 3.9 4.0  Chloride 98 - 111 mmol/L 103 100 98  CO2 22 - 32 mmol/L _1 Calcium 8.9 - 10.3 mg/dL 8.5(L) 9.2 8.9  Total Protein 6.5 - 8.1 g/dL 6.5 7.2 7.7  Total Bilirubin 0.3 - 1.2 mg/dL <0.2(L) 0.4 0.4  Alkaline Phos 38 - 126 U/L 104 128(H) 141(H)  AST 15 - 41 U/L 39 26 49(H)  ALT 0 - 44 U/L _2 Lab Results  Component Value Date   WBC 2.2 (L) 11/03/2018   HGB 7.8 (L) 11/03/2018   HCT 23.7 (L) 11/03/2018   MCV 103.9 (H) 11/03/2018   PLT 324 11/03/2018   NEUTROABS 1.4 (L) 11/03/2018      Assessment Plan:  Malignant neoplasm of lower-outer quadrant of left breast of female, estrogen receptor positive ([place]) Left breast cancer invasive ductal carcinoma 2.5 cm in size grade 1, ER 99%, PR 41%, Ki-67 11%, HER-2 negative T2 N0 M0 stage II a status post neoadjuvant chemotherapy followed by surgery February 2011and wason antiestrogen therapy with Femara from January 2011-July 2017  New onset mid back pain: MRI revealedT11 severe pathologic fracture, abnormal signal T10, T11 and T12, moderate canal stenosis at T11  12/02/2017: PET CT scan hypermetabolic lymphadenopathy in the thoracic and upper abdominal lymph nodes, spine and rib metastases, intramuscular metastases the left pectoralis muscle, T11 vertebral fracture pathologic  Biopsy of the T11 vertebral body ER 90%, PR 0%, Ki-67 10%, HER-2 -1+, foundation 1, PDL 1  Pathologic fracture T11: She met with orthopedics regarding  kyphoplasty and they determined that there is no surgical options available for her. ------------------------------------------------------------------- Treatment : 1. Palliative radiation to T11 vertebral body and the pectoralis muscle: 12/20/17-12/26/17 2. Ibrance with letrozole along with Delton See or Zometa discontinued July 2020 due to progression Patient could not tolerate Faslodex because of intense bone pain as well as hypotension.  Severe shortness of breathwith recurrent pleural effusions: Pleurx catheter was removed  CT CAP and bone scan 10/19/2018:Worsening left pleural effusion with pleural and pericardial metastases, new hilar lymph node and subcarinal lymph node metastases, new left adrenal metastases. New T5 sclerotic partial compression fracture. Small lesions in the liver.  Current treatment: Halaven  Plan: 1.  Consult Dr. Lisbeth Renshaw for possibility of additional radiation because of bone pain has gotten worse. 2.  Consult cardiothoracic surgery regarding her recurrent pleural effusions with a failed Pleurx drain  She wanted her family to listen to the findings on the scans. I discussed the entire scan report in great detail.    I discussed the assessment and treatment plan with the patient. The patient was provided an opportunity to ask questions and all were answered. The patient agreed with the plan and demonstrated an understanding of the instructions. The patient was advised to call back or  seek an in-person evaluation if the symptoms worsen or if the condition fails to improve as anticipated.   I provided 20 minutes of face-to-face Doximity time during this encounter.    Elizabeth Eisenmenger, MD 11/09/2018   I, Molly Dorshimer, am acting as scribe for Nicholas Lose, MD.  I have reviewed the above documentation for accuracy and completeness, and I agree with the above.

## 2018-11-09 ENCOUNTER — Inpatient Hospital Stay (HOSPITAL_BASED_OUTPATIENT_CLINIC_OR_DEPARTMENT_OTHER): Payer: 59 | Admitting: Hematology and Oncology

## 2018-11-09 ENCOUNTER — Ambulatory Visit: Payer: 59 | Admitting: Hematology and Oncology

## 2018-11-09 DIAGNOSIS — C50512 Malignant neoplasm of lower-outer quadrant of left female breast: Secondary | ICD-10-CM | POA: Diagnosis not present

## 2018-11-09 DIAGNOSIS — Z9221 Personal history of antineoplastic chemotherapy: Secondary | ICD-10-CM | POA: Diagnosis not present

## 2018-11-09 DIAGNOSIS — Z923 Personal history of irradiation: Secondary | ICD-10-CM | POA: Diagnosis not present

## 2018-11-09 DIAGNOSIS — Z170 Estrogen receptor positive status [ER+]: Secondary | ICD-10-CM

## 2018-11-09 DIAGNOSIS — Z79811 Long term (current) use of aromatase inhibitors: Secondary | ICD-10-CM

## 2018-11-09 NOTE — Assessment & Plan Note (Signed)
Left breast cancer invasive ductal carcinoma 2.5 cm in size grade 1, ER 99%, PR 41%, Ki-67 11%, HER-2 negative T2 N0 M0 stage II a status post neoadjuvant chemotherapy followed by surgery February 2011and wason antiestrogen therapy with Femara from January 2011-July 2017  New onset mid back pain: MRI revealedT11 severe pathologic fracture, abnormal signal T10, T11 and T12, moderate canal stenosis at T11  12/02/2017: PET CT scan hypermetabolic lymphadenopathy in the thoracic and upper abdominal lymph nodes, spine and rib metastases, intramuscular metastases the left pectoralis muscle, T11 vertebral fracture pathologic  Biopsy of the T11 vertebral body ER 90%, PR 0%, Ki-67 10%, HER-2 -1+, foundation 1, PDL 1  Pathologic fracture T11: She met with orthopedics regarding kyphoplasty and they determined that there is no surgical options available for her. ------------------------------------------------------------------- Treatment : 1. Palliative radiation to T11 vertebral body and the pectoralis muscle: 12/20/17-12/26/17 2. Ibrance with letrozole along with Delton See or Zometa discontinued July 2020 due to progression Patient could not tolerate Faslodex because of intense bone pain as well as hypotension.  Severe shortness of breathwith recurrent pleural effusions: Pleurx catheter was removed  CT CAP and bone scan 10/19/2018:Worsening left pleural effusion with pleural and pericardial metastases, new hilar lymph node and subcarinal lymph node metastases, new left adrenal metastases. New T5 sclerotic partial compression fracture. Small lesions in the liver.  Current treatment: Halaven  Plan: 1.  Consult Dr. Lisbeth Renshaw for possibility of additional radiation because of bone pain has gotten worse. 2.  Consult cardiothoracic surgery regarding her recurrent pleural effusions with a failed Pleurx drain  She wanted her family to listen to the findings on the scans. I discussed the entire scan  report in great detail.

## 2018-11-10 ENCOUNTER — Other Ambulatory Visit: Payer: Self-pay

## 2018-11-10 DIAGNOSIS — C50512 Malignant neoplasm of lower-outer quadrant of left female breast: Secondary | ICD-10-CM

## 2018-11-10 DIAGNOSIS — J90 Pleural effusion, not elsewhere classified: Secondary | ICD-10-CM

## 2018-11-10 NOTE — Assessment & Plan Note (Signed)
Left breast cancer invasive ductal carcinoma 2.5 cm in size grade 1, ER 99%, PR 41%, Ki-67 11%, HER-2 negative T2 N0 M0 stage II a status post neoadjuvant chemotherapy followed by surgery February 2011and wason antiestrogen therapy with Femara from January 2011-July 2017  New onset mid back pain: MRI revealedT11 severe pathologic fracture, abnormal signal T10, T11 and T12, moderate canal stenosis at T11  12/02/2017: PET CT scan hypermetabolic lymphadenopathy in the thoracic and upper abdominal lymph nodes, spine and rib metastases, intramuscular metastases the left pectoralis muscle, T11 vertebral fracture pathologic  Biopsy of the T11 vertebral body ER 90%, PR 0%, Ki-67 10%, HER-2 -1+, foundation 1, PDL 1  Pathologic fracture T11: She met with orthopedics regarding kyphoplasty and they determined that there is no surgical options available for her. ------------------------------------------------------------------- Treatment : 1. Palliative radiation to T11 vertebral body and the pectoralis muscle: 12/20/17-12/26/17 2. Ibrance with letrozole along with Delton See or Zometadiscontinued July 2020 due to progression Patient could not tolerate Faslodex because of intense bone pain as well as hypotension. 3.  Current treatment: Halaven started 10/27/2018, day 1 day 8 every 3 weeks, today cycle 2  Severe shortness of breathwith recurrent pleural effusions: Pleurx catheter was removed, cardiothoracic surgery has been consulted  CT CAP and bone scan 10/19/2018:Worsening left pleural effusion with pleural and pericardial metastases, new hilar lymph node and subcarinal lymph node metastases, new left adrenal metastases. New T5 sclerotic partial compression fracture. Small lesions in the liver.  Severe pain issues due to metastatic cancer of the bone: We are requesting Dr. Lisbeth Renshaw to see her for additional palliative radiation.

## 2018-11-10 NOTE — Progress Notes (Signed)
Referral placed per MD recommendations for cardiothoracic consult d/t recurrent pleural effusions and failed Pleurx drain.

## 2018-11-11 ENCOUNTER — Other Ambulatory Visit: Payer: Self-pay

## 2018-11-11 ENCOUNTER — Ambulatory Visit
Admission: RE | Admit: 2018-11-11 | Discharge: 2018-11-11 | Disposition: A | Discharge status: Home / Self Care | Payer: 59 | Source: Ambulatory Visit | Attending: Radiation Oncology | Admitting: Radiation Oncology

## 2018-11-11 ENCOUNTER — Encounter: Payer: Self-pay | Admitting: Radiation Oncology

## 2018-11-11 VITALS — Ht 63.5 in | Wt 112.0 lb

## 2018-11-11 DIAGNOSIS — C50512 Malignant neoplasm of lower-outer quadrant of left female breast: Secondary | ICD-10-CM

## 2018-11-11 DIAGNOSIS — C7951 Secondary malignant neoplasm of bone: Secondary | ICD-10-CM

## 2018-11-11 NOTE — [place] Notes (Signed)
Radiation Oncology         [phone] ________________________________ Outpatient Re-Consultation - Conducted via telephone due to current COVID-19 concerns for limiting patient exposure  I spoke with the patient to conduct this consult visit via telephone to spare the patient unnecessary potential exposure in the healthcare setting during the current COVID-19 pandemic. The patient was notified in advance and was offered a [place] meeting to allow for face to face communication but unfortunately reported that they did not have the appropriate resources/technology to support such a visit and instead preferred to proceed with a telephone consult.   ________________________________  Name: Elizabeth [name]        MRN: [id number]  Date of Service: 11/11/2018 DOB: 04-05-1956  GY:IRSWNI, Barth Kirks, PA-C  Nicholas Lose, MD     REFERRING PHYSICIAN: Nicholas Lose, MD   DIAGNOSIS: The primary encounter diagnosis was Secondary malignant neoplasm of bone and bone marrow ([place]). Diagnoses of Malignant neoplasm of lower-outer quadrant of left breast of female, estrogen receptor positive ([place]) and Bone metastasis ([place]) were also pertinent to this visit.   HISTORY OF PRESENT ILLNESS: Elizabeth [name] is a 63 y.o. female with a history of ER/PR positive left breast cancer originally diagnosed in 2011 treated with neoadjuvant chemotherapy, followed by mastectomies and reconstruction. She completed adjuvant hormone therapy as well. In 2019, she was found to have recurrent disease in the thoracic spine. She received palliative radiotherapy to the T11 lesion. She has been on Ibrance and Letrozole since, and was recently found to have progressive disease in the T5, left cervical spine, lumbar spine, sacral spine, pelvis, right 8th rib, and right femoral head. She is contacted by phone today to discuss treatment options to her bony disease.     PREVIOUS RADIATION THERAPY: Yes   12/20/17-12/26/17: The patient received 30 Gy  in 10 fractions to the T11 vertebral body   PAST MEDICAL HISTORY:  Past Medical History:  Diagnosis Date   Arthritis    Breast cancer ([place]) 2010   Left   Chronic sinusitis    Family history of adverse reaction to anesthesia    sister-nausea/vomiting   History of chemotherapy 01/2009-03/2009   Hypercholesteremia    Hypothyroid    "inactive thyroid"   Metastasis ([place]) 11/2017   LN   Metastatic cancer to bone ([place]) 11/2017   Migraines    RSD (reflex sympathetic dystrophy)        PAST SURGICAL HISTORY: Past Surgical History:  Procedure Laterality Date   BREAST RECONSTRUCTION     EYE SURGERY Bilateral 7/18, 10/18   IR IMAGING GUIDED PORT INSERTION  10/28/2018   IR INSTILL VIA CHEST TUBE AGENT FOR FIBRINOLYSIS INI DAY  10/09/2018   IR PATIENT EVAL TECH 0-60 MINS  09/08/2018   IR PERC PLEURAL DRAIN W/INDWELL CATH W/IMG GUIDE  08/05/2018   IR RADIOLOGIST EVAL & MGMT  10/12/2018   IR REMOVAL OF PLURAL CATH W/CUFF  10/26/2018   KNEE ARTHROSCOPY Bilateral    KNEE SURGERY Right    Two surgeries   MASTECTOMY Bilateral 05/22/2009   MASTECTOMY Bilateral 2011   restrictions on left arm-no labs/bloodpressure   NASAL SINUS SURGERY     PORTA CATH INSERTION  12/2008   PORTA CATH REMOVAL  05/2009   ROOT CANAL     SHOULDER SURGERY Right    TOTAL HIP ARTHROPLASTY Left 05/26/2017   Procedure: TOTAL HIP ARTHROPLASTY ANTERIOR APPROACH;  Surgeon: Frederik Pear, MD;  Location: [facility];  Service: Orthopedics;  Laterality: Left;  FAMILY HISTORY:  Family History  Problem Relation Age of Onset   Hypertension Mother      SOCIAL HISTORY:  reports that she has never smoked. She has never used smokeless tobacco. She reports that she does not drink alcohol or use drugs. The patient is married and lives in [place].    ALLERGIES: Shellfish allergy and Sulfa antibiotics   MEDICATIONS:  Current Outpatient Medications  Medication Sig Dispense Refill   Biotin 10000 MCG  TABS Take 10,000 mcg by mouth daily.      Cholecalciferol (VITAMIN D PO) Take 5,000 Units by mouth daily.      DULoxetine (CYMBALTA) 30 MG capsule TAKE 1 CAPSULE (30 MG TOTAL) BY MOUTH 2 (TWO) TIMES DAILY. 180 capsule 2   gabapentin (NEURONTIN) 300 MG capsule TAKE 1 CAPSULE BY MOUTH EVERYDAY AT BEDTIME 90 capsule 2   levothyroxine (SYNTHROID, LEVOTHROID) 25 MCG tablet Take 25 mcg by mouth daily before breakfast.      Multiple Vitamin (MULTIVITAMIN) tablet Take 1 tablet by mouth daily.     omeprazole (PRILOSEC) 20 MG capsule Take 1 capsule (20 mg total) by mouth daily. 30 capsule 11   oxyCODONE-acetaminophen (PERCOCET) 10-325 MG tablet Take 1 tablet by mouth every 8 (eight) hours as needed for pain. 90 tablet 0   prednisoLONE acetate (PRED FORTE) 1 % ophthalmic suspension Place 1 drop into both eyes daily.     lidocaine-prilocaine (EMLA) cream Apply to affected area once (Patient not taking: Reported on 11/11/2018) 30 g 3   loperamide (IMODIUM) 2 MG capsule 1 to 2 PO QID prn diarrhea (Patient not taking: Reported on 11/11/2018) 30 capsule 22   LORazepam (ATIVAN) 0.5 MG tablet Take 1 tablet (0.5 mg total) by mouth every 6 (six) hours as needed (Nausea or vomiting). (Patient not taking: Reported on 11/11/2018) 30 tablet 0   magic mouthwash w/lidocaine SOLN Take 5 mLs by mouth 3 (three) times daily as needed for mouth pain. (Patient not taking: Reported on 11/11/2018) 100 mL 0   Omega-3 Fatty Acids (SUPER OMEGA 3 EPA/DHA PO) Take 1 capsule by mouth daily.     ondansetron (ZOFRAN) 8 MG tablet Take 1 tablet (8 mg total) by mouth 2 (two) times daily as needed (Nausea or vomiting). (Patient not taking: Reported on 11/11/2018) 30 tablet 1   prochlorperazine (COMPAZINE) 10 MG tablet Take 1 tablet (10 mg total) by mouth every 6 (six) hours as needed (Nausea or vomiting). (Patient not taking: Reported on 11/11/2018) 30 tablet 1   prochlorperazine (COMPAZINE) 5 MG tablet Take 1 tablet (5 mg total) by  mouth every 6 (six) hours as needed for nausea or vomiting. (Patient not taking: Reported on 11/11/2018) 45 tablet 2   simethicone (GAS-X) 80 MG chewable tablet Chew 1 tablet (80 mg total) by mouth every 6 (six) hours as needed for flatulence. (Patient not taking: Reported on 11/11/2018) 120 tablet 2   traMADol (ULTRAM) 50 MG tablet Take 1 tablet (50 mg total) by mouth every 6 (six) hours as needed. (Patient not taking: Reported on 11/11/2018) 30 tablet 0   No current facility-administered medications for this encounter.      REVIEW OF SYSTEMS: On review of systems, the patient reports that she is doing pretty well. She reports she is having some severe low back pain. She describes this as similar to the location of her treatment she had last summer. She denies any pain higher in the spine or chest wall area. She denies any hip pain.  She  denies any chest pain, shortness of breath, cough, fevers, chills, night sweats, unintended weight changes. She denies any bowel or bladder disturbances, and denies abdominal pain, nausea or vomiting. She denies any additional musculoskeletal or joint aches or pains. A complete review of systems is obtained and is otherwise negative.     PHYSICAL EXAM:  Wt Readings from Last 3 Encounters:  11/11/18 112 lb (50.8 kg)  11/03/18 112 lb 8 oz (51 kg)  10/28/18 111 lb (50.3 kg)   Unable to assess due to nature of encounter.   ECOG = 1  0 - Asymptomatic (Fully active, able to carry on all predisease activities without restriction)  1 - Symptomatic but completely ambulatory (Restricted in physically strenuous activity but ambulatory and able to carry out work of a light or sedentary nature. For example, light housework, office work)  2 - Symptomatic, <50% in bed during the day (Ambulatory and capable of all self care but unable to carry out any work activities. Up and about more than 50% of waking hours)  3 - Symptomatic, >50% in bed, but not bedbound (Capable of  only limited self-care, confined to bed or chair 50% or more of waking hours)  4 - Bedbound (Completely disabled. Cannot carry on any self-care. Totally confined to bed or chair)  5 - Death   Eustace Pen MM, Creech RH, Tormey DC, et al. [phone]). "Toxicity and response criteria of the [facility]". [place] Oncol. 5 (6): 649-55    LABORATORY DATA:  Lab Results  Component Value Date   WBC 2.2 (L) 11/03/2018   HGB 7.8 (L) 11/03/2018   HCT 23.7 (L) 11/03/2018   MCV 103.9 (H) 11/03/2018   PLT 324 11/03/2018   Lab Results  Component Value Date   NA 139 11/03/2018   K 3.7 11/03/2018   CL 103 11/03/2018   CO2 24 11/03/2018   Lab Results  Component Value Date   ALT 12 11/03/2018   AST 39 11/03/2018   ALKPHOS 104 11/03/2018   BILITOT <0.2 (L) 11/03/2018      RADIOGRAPHY: Ct Chest W Contrast  Result Date: 10/19/2018 CLINICAL DATA:  Restaging metastatic breast cancer EXAM: CT CHEST, ABDOMEN, AND PELVIS WITH CONTRAST TECHNIQUE: Multidetector CT imaging of the chest, abdomen and pelvis was performed following the standard protocol during bolus administration of intravenous contrast. CONTRAST:  19mL OMNIPAQUE IOHEXOL 300 MG/ML  SOLN COMPARISON:  04/09/2018 FINDINGS: CT CHEST FINDINGS Cardiovascular: Heart is normal in size. No pericardial effusion. Mild nodular thickening along the anterior pericardium (series 2/images 43, 46, and 48), nonspecific but raising concern for early pericardial metastases. No evidence of thoracic aortic aneurysm. Mild atherosclerotic calcifications of the descending thoracic aorta. Mild coronary atherosclerosis of the LAD. Mediastinum/Nodes: Small mediastinal lymph nodes measuring up to 12 mm (series 2/image 35), previously 13 mm. 14 mm short axis right hilar node (series 2/image 30), new/progressive. No suspicious axillary lymphadenopathy. Lungs/Pleura: Large left pleural effusion, loculated. Indwelling pleural drain. Pleural-based  nodularity/metastases along the left superior mediastinum, measuring up to 14 mm (series 2/image 26), new/progressive. Additional pleural-based nodularity along the left hemidiaphragm (series 2/image 56), more conspicuous than on the prior. Trace right pleural effusion, new. Mild peribronchovascular nodularity with interlobular septal thickening in the right upper and middle lobes anteriorly, raising the possibility of very mild lymphangitic spread. However, this may also reflect radiation changes. Prior focal opacity in the medial right lower lobe is less conspicuous on the current study. Atelectasis in the medial left upper  and lower lobes. No pneumothorax. Musculoskeletal: Status post bilateral mastectomy with augmentation. Moderate compression fracture deformity at T4, new. Moderate to severe compression fracture deformity at T11, unchanged, pathologic. Stable multifocal sclerotic osseous metastases, including involving the posterior aspect of T12. Old pathologic lytic posterior 9th rib fracture. Old expansile sclerotic metastasis involving the posterior 11th rib. CT ABDOMEN PELVIS FINDINGS Hepatobiliary: Hypodensity along the inferior aspect of the falciform ligament (series 2/image 66), favored to reflect focal fat, new. Stable 4 mm probable cyst in the lateral segment left hepatic lobe (series 2/image 59). Stable 6 mm probable cyst inferiorly in the right hepatic lobe (series 2/image 76). Gallbladder is unremarkable. No intrahepatic or extrahepatic ductal dilatation. Pancreas: Within normal limits. Spleen: Within normal limits. Adrenals/Urinary Tract: Multifocal left adrenal metastases, measuring up to 1.8 cm (series 2/image 58), new. Right adrenal gland is within normal limits. Subcentimeter right upper pole renal cyst (series 7/image 10). Left kidney is within normal limits. No hydronephrosis. Bladder is underdistended but unremarkable. Stomach/Bowel: Stomach is within normal limits. No evidence of bowel  obstruction. Appendix is not discretely visualized. Moderate colonic stool burden. Vascular/Lymphatic: No evidence of abdominal aortic aneurysm. 2.6 x 2.1 cm left para-aortic nodal mass (series 2/image 62), new. 8 mm short axis node anterior to the infrarenal abdominal aorta (series 2/image 65), new. Reproductive: Uterus is grossly unremarkable. No adnexal masses. Other: No abdominopelvic ascites. Musculoskeletal: Multifocal sclerotic osseous metastases, grossly unchanged. IMPRESSION: Large left pleural effusion with progressive pleural and possible pericardial metastases, as described above. Indwelling left pleural drain. Possible lymphangitic spread in the right upper and middle lobes, indeterminate. Prior lesion in the medial right lower lobe is less conspicuous on the current study. New right hilar nodal metastasis. New left para-aortic nodal metastasis. Additional lymphadenopathy is grossly unchanged. New left adrenal metastases. Multifocal osseous metastases, largely unchanged, although with new moderate compression fracture deformity at T4. Electronically Signed   By: Julian Hy M.D.   On: 10/19/2018 12:22   Nm Bone Scan Whole Body  Result Date: 10/19/2018 CLINICAL DATA:  Stage IV invasive breast cancer, LEFT shoulder soreness with tenderness to the touch for 3 weeks EXAM: NUCLEAR MEDICINE WHOLE BODY BONE SCAN TECHNIQUE: Whole body anterior and posterior images were obtained approximately 3 hours after intravenous injection of radiopharmaceutical. RADIOPHARMACEUTICALS:  19.5 mCi Technetium-48m MDP IV COMPARISON:  04/09/2018 Correlation: CT chest abdomen pelvis 10/19/2018 FINDINGS: Multiple sites of abnormal osseous tracer accumulation are identified consistent with osseous metastatic disease. Chronic abnormal uptake is identified in lower thoracic spine at T10-T12 region. Also again identified are sites of uptake at several LEFT ribs. New areas of tracer accumulation are seen within the midthoracic  spine at approximately T5, LEFT lateral cervical spine, lumbar spine, sacrum, pelvis, posterolateral RIGHT eighth rib, and RIGHT femoral head consistent with osseous metastases. Expected urinary tract and soft tissue distribution of tracer. IMPRESSION: Progressive osseous metastatic disease as above. Electronically Signed   By: Lavonia Dana M.D.   On: 10/19/2018 17:11   Ct Abdomen Pelvis W Contrast  Result Date: 10/19/2018 CLINICAL DATA:  Restaging metastatic breast cancer EXAM: CT CHEST, ABDOMEN, AND PELVIS WITH CONTRAST TECHNIQUE: Multidetector CT imaging of the chest, abdomen and pelvis was performed following the standard protocol during bolus administration of intravenous contrast. CONTRAST:  13mL OMNIPAQUE IOHEXOL 300 MG/ML  SOLN COMPARISON:  04/09/2018 FINDINGS: CT CHEST FINDINGS Cardiovascular: Heart is normal in size. No pericardial effusion. Mild nodular thickening along the anterior pericardium (series 2/images 43, 46, and 48), nonspecific but raising concern for  early pericardial metastases. No evidence of thoracic aortic aneurysm. Mild atherosclerotic calcifications of the descending thoracic aorta. Mild coronary atherosclerosis of the LAD. Mediastinum/Nodes: Small mediastinal lymph nodes measuring up to 12 mm (series 2/image 35), previously 13 mm. 14 mm short axis right hilar node (series 2/image 30), new/progressive. No suspicious axillary lymphadenopathy. Lungs/Pleura: Large left pleural effusion, loculated. Indwelling pleural drain. Pleural-based nodularity/metastases along the left superior mediastinum, measuring up to 14 mm (series 2/image 26), new/progressive. Additional pleural-based nodularity along the left hemidiaphragm (series 2/image 56), more conspicuous than on the prior. Trace right pleural effusion, new. Mild peribronchovascular nodularity with interlobular septal thickening in the right upper and middle lobes anteriorly, raising the possibility of very mild lymphangitic spread.  However, this may also reflect radiation changes. Prior focal opacity in the medial right lower lobe is less conspicuous on the current study. Atelectasis in the medial left upper and lower lobes. No pneumothorax. Musculoskeletal: Status post bilateral mastectomy with augmentation. Moderate compression fracture deformity at T4, new. Moderate to severe compression fracture deformity at T11, unchanged, pathologic. Stable multifocal sclerotic osseous metastases, including involving the posterior aspect of T12. Old pathologic lytic posterior 9th rib fracture. Old expansile sclerotic metastasis involving the posterior 11th rib. CT ABDOMEN PELVIS FINDINGS Hepatobiliary: Hypodensity along the inferior aspect of the falciform ligament (series 2/image 66), favored to reflect focal fat, new. Stable 4 mm probable cyst in the lateral segment left hepatic lobe (series 2/image 59). Stable 6 mm probable cyst inferiorly in the right hepatic lobe (series 2/image 76). Gallbladder is unremarkable. No intrahepatic or extrahepatic ductal dilatation. Pancreas: Within normal limits. Spleen: Within normal limits. Adrenals/Urinary Tract: Multifocal left adrenal metastases, measuring up to 1.8 cm (series 2/image 58), new. Right adrenal gland is within normal limits. Subcentimeter right upper pole renal cyst (series 7/image 10). Left kidney is within normal limits. No hydronephrosis. Bladder is underdistended but unremarkable. Stomach/Bowel: Stomach is within normal limits. No evidence of bowel obstruction. Appendix is not discretely visualized. Moderate colonic stool burden. Vascular/Lymphatic: No evidence of abdominal aortic aneurysm. 2.6 x 2.1 cm left para-aortic nodal mass (series 2/image 62), new. 8 mm short axis node anterior to the infrarenal abdominal aorta (series 2/image 65), new. Reproductive: Uterus is grossly unremarkable. No adnexal masses. Other: No abdominopelvic ascites. Musculoskeletal: Multifocal sclerotic osseous  metastases, grossly unchanged. IMPRESSION: Large left pleural effusion with progressive pleural and possible pericardial metastases, as described above. Indwelling left pleural drain. Possible lymphangitic spread in the right upper and middle lobes, indeterminate. Prior lesion in the medial right lower lobe is less conspicuous on the current study. New right hilar nodal metastasis. New left para-aortic nodal metastasis. Additional lymphadenopathy is grossly unchanged. New left adrenal metastases. Multifocal osseous metastases, largely unchanged, although with new moderate compression fracture deformity at T4. Electronically Signed   By: Julian Hy M.D.   On: 10/19/2018 12:22   Dg Chest Portable 1 View  Result Date: 10/23/2018 CLINICAL DATA:  Post thoracentesis EXAM: PORTABLE CHEST 1 VIEW COMPARISON:  10/23/2018, CT 10/19/2018, radiograph 10/08/2018 FINDINGS: Trace right-sided pleural effusion. Left-sided pleural drainage catheter with tip near the medial apex. Loculated left pleural effusion, slightly decreased though with moderate large residual. No definitive pneumothorax identified. Bandlike opacities in the left upper lobe show no significant interval change. Continued consolidation in the lingula and left lung base. Stable cardiomediastinal silhouette. IMPRESSION: 1. Moderate to large left pleural effusion, slightly decreased compared to prior. No definitive pneumothorax. 2. Trace right-sided pleural effusion. Continued consolidation in the lingula and left lung  base. Electronically Signed   By: Donavan Foil M.D.   On: 10/23/2018 21:06   Dg Chest Portable 1 View  Result Date: 10/23/2018 CLINICAL DATA:  Shortness of breath. EXAM: PORTABLE CHEST 1 VIEW COMPARISON:  October 08, 2018 FINDINGS: There is a large left-sided pleural effusion with slight interval increase in size from prior study. A left-sided PleurX catheter is noted. There is no definite pneumothorax. There is compressive atelectasis at  the lung bases bilaterally. There is a trace of small right-sided pleural effusion. The heart size appears stable but is not well evaluated. There is some volume overload. There is stable height loss of several thoracic vertebral bodies. IMPRESSION: 1. Large left-sided pleural effusion with slight interval increased from prior study. 2. No significant interval change in positioning of the left-sided PleurX catheter. 3. Small right-sided pleural effusion. Electronically Signed   By: Constance Holster M.D.   On: 10/23/2018 17:14   Ir US Chest  Result Date: 10/26/2018 CLINICAL DATA:  63 year old with history of metastatic breast cancer with a tunneled left pleural catheter. Pleural catheter has not been functioning well despite having a large amount of pleural fluid on CT from 10/19/2018. EXAM: CHEST ULTRASOUND COMPARISON:  CT 10/19/2018 FINDINGS: Left pleural effusion is severely loculated. Innumerable small pockets of fluid throughout the left hemithorax. IMPRESSION: Severely loculated left pleural effusion. The loculated pleural fluid will make it difficult for percutaneous management of this effusion with either thoracentesis or tube placement. Electronically Signed   By: Markus Daft M.D.   On: 10/26/2018 18:10   Ir Imaging Guided Port Insertion  Result Date: 10/28/2018 CLINICAL DATA:  Breast carcinoma and need for porta cath for chemotherapy. EXAM: IMPLANTED PORT A CATH PLACEMENT WITH ULTRASOUND AND FLUOROSCOPIC GUIDANCE ANESTHESIA/SEDATION: 3.0 mg IV Versed; 100 mcg IV Fentanyl Total Moderate Sedation Time:  41 minutes The patient's level of consciousness and physiologic status were continuously monitored during the procedure by Radiology nursing. Additional Medications: 2 g IV Ancef. FLUOROSCOPY TIME:  24 seconds.  1.4 mGy. PROCEDURE: The procedure, risks, benefits, and alternatives were explained to the patient. Questions regarding the procedure were encouraged and answered. The patient understands and  consents to the procedure. A time-out was performed prior to initiating the procedure. Ultrasound was utilized to confirm patency of the right internal jugular vein. The right neck and chest were prepped with chlorhexidine in a sterile fashion, and a sterile drape was applied covering the operative field. Maximum barrier sterile technique with sterile gowns and gloves were used for the procedure. Local anesthesia was provided with 1% lidocaine. After creating a small venotomy incision, a 21 gauge needle was advanced into the right internal jugular vein under direct, real-time ultrasound guidance. Ultrasound image documentation was performed. After securing guidewire access, an 8 Fr dilator was placed. A J-wire was kinked to measure appropriate catheter length. A subcutaneous port pocket was then created along the upper chest wall utilizing sharp and blunt dissection. Portable cautery was utilized. The pocket was irrigated with sterile saline. A single lumen power injectable port was chosen for placement. The 8 Fr catheter was tunneled from the port pocket site to the venotomy incision. The port was placed in the pocket. External catheter was trimmed to appropriate length based on guidewire measurement. At the venotomy, an 8 Fr peel-away sheath was placed over a guidewire. The catheter was then placed through the sheath and the sheath removed. Final catheter positioning was confirmed and documented with a fluoroscopic spot image. The port was accessed with  a needle and aspirated and flushed with heparinized saline. The access needle was removed. The venotomy and port pocket incisions were closed with subcutaneous 3-0 Monocryl and subcuticular 4-0 Vicryl. Dermabond was applied to both incisions. COMPLICATIONS: COMPLICATIONS None FINDINGS: After catheter placement, the tip lies at the cavo-atrial junction. The catheter aspirates normally and is ready for immediate use. IMPRESSION: Placement of single lumen port a cath  via right internal jugular vein. The catheter tip lies at the cavo-atrial junction. A power injectable port a cath was placed and is ready for immediate use. Electronically Signed   By: Aletta Edouard M.D.   On: 10/28/2018 16:49   Ir Removal Of Plural Cath W/cuff  Result Date: 10/26/2018 INDICATION: Patient with history of recurrent left pleural effusion s/p PleurX catheter placement 08/05/18 by Dr. Anselm Pancoast. Patient with decreased function of the catheter over the past several weeks. Despite the use of fibrinolytics and tube manipulation, the catheter remains non-function. She presents for removal today. EXAM: REMOVAL OF TUNNELED PLEURAL CATHETER MEDICATIONS: 10 mL 1% lidocaine ANESTHESIA/SEDATION: None FLUOROSCOPY TIME:  None COMPLICATIONS: None immediate. PROCEDURE: Informed written consent was obtained from the patient following an explanation of the procedure, risks, benefits and alternatives to treatment. A time out was performed prior to the initiation of the procedure. Maximal barrier sterile technique was utilized including caps, mask, sterile gowns, sterile gloves, large sterile drape, hand hygiene, and cloraprep. 1% lidocaine with epinephrine was injected under sterile conditions along the subcutaneous tunnel. Utilizing a combination of blunt dissection and gentle traction, the catheter was removed intact. Hemostasis was obtained with manual compression. A dressing was placed. The patient tolerated the procedure well without immediate post procedural complication. IMPRESSION: Successful removal of right tunneled PleurX catheter. Read by: Brynda Greathouse PA-C Electronically Signed   By: Markus Daft M.D.   On: 10/26/2018 15:53       IMPRESSION/PLAN: 1. Progressive metastatic ER positive breast cancer. Dr. Lisbeth Renshaw discusses the recent imaging findings and course since we last saw the patient. He recommends a course of palliative radiotherapy to the T5 location of the spine as well as to the right femur. It  is difficult to say the site of where the low back pain originates from, but we discussed placing a wire over the site at the time of simulation to better cover the location that matches up with her diagnostic scans as we would not anticipate retreating the location she received treatment to last year.  We discussed the risks, benefits, short, and long term effects of radiotherapy, and the patient is interested in proceeding. Dr. Lisbeth Renshaw discusses the delivery and logistics of radiotherapy and anticipates a course of 2 weeks of radiotherapy. She would like to come tomorrow for simulation and we will sign written consent at that time. She is getting ready to begin Halaven systemic therapy with Dr. Lindi Adie as well.  2. Pain secondary to #1. The patient will continue her current regimen and let us know if she needs more help in managing this.   Given current concerns for patient exposure during the COVID-19 pandemic, this encounter was conducted via telephone.  The patient has given verbal consent for this type of encounter. The time spent during this encounter was 30 minutes and 50% of that time was spent in the coordination of her care. The attendants for this meeting included Dr. Lisbeth Renshaw, Shona Simpson, [facility] and Roselyn Reef [place]  During the encounter, Dr. Lisbeth Renshaw and Shona Simpson [facility] were located at [facility]  Radiation Oncology Department.  Kenita Bines [place]  was located at home.  The above documentation reflects my direct findings during this shared patient visit. Please see the separate note by Dr. Lisbeth Renshaw on this date for the remainder of the patient's plan of care.    Carola Rhine, PAC

## 2018-11-11 NOTE — Progress Notes (Signed)
See progress note under PA's encounter.

## 2018-11-11 NOTE — Progress Notes (Signed)
Phoned patient to collect meaningful use prior to PA encounter at 1030. The purpose of this note is to document concerns expressed by patient to this RN during encounter. Patient questions if additional radiation therapy can be provided to new areas within her spine specifically T11 which was previously treated. Patient verbalized her understanding that a kyphoplasty isn't possible at T11 since "there is no shell to inject it into." Patient reports in the last week she has had to be "a lot less" active, remaining in bed most days because activity causes her to have severe low back pain 10 on a scale of 0-10. Reports the pain radiates down both of her legs. Reports intermittent tingling in her left leg. Also, she reports using a walker in the last week to take pressure off her spine in an attempt to reduce pain. Patient questions if new areas in her chest can be radiated. Patient questions if radiating these lesions in her chest will help reduce fluid build up. Patient endorses oxygen ordered by Dr. Lindi Adie "helps a lot." Patient denies having an appointment with a cardiac surgeon yet to discuss pleural effusions and failed Pleurx drain but understands Dr. Lindi Adie is working on this. Patient reports she noted last week the area where her Pleurx drain was has new onset edema. Patient is concerned the new edema could be a new area of metastasis. Reports indigestions that Dr. Lindi Adie has prescribed multiple medications to aid in management. Reports constipation which she relates to pain medication.

## 2018-11-12 ENCOUNTER — Ambulatory Visit
Admission: RE | Admit: 2018-11-12 | Discharge: 2018-11-12 | Disposition: A | Discharge status: Home / Self Care | Payer: 59 | Source: Ambulatory Visit | Attending: Radiation Oncology | Admitting: Radiation Oncology

## 2018-11-12 ENCOUNTER — Other Ambulatory Visit: Payer: Self-pay

## 2018-11-12 DIAGNOSIS — C7951 Secondary malignant neoplasm of bone: Secondary | ICD-10-CM | POA: Diagnosis present

## 2018-11-12 DIAGNOSIS — Z510 Encounter for antineoplastic radiation therapy: Secondary | ICD-10-CM | POA: Insufficient documentation

## 2018-11-12 DIAGNOSIS — C50512 Malignant neoplasm of lower-outer quadrant of left female breast: Secondary | ICD-10-CM | POA: Diagnosis not present

## 2018-11-12 DIAGNOSIS — Z170 Estrogen receptor positive status [ER+]: Secondary | ICD-10-CM | POA: Diagnosis not present

## 2018-11-13 ENCOUNTER — Other Ambulatory Visit: Payer: Self-pay | Admitting: *Deleted

## 2018-11-13 DIAGNOSIS — C7951 Secondary malignant neoplasm of bone: Secondary | ICD-10-CM | POA: Diagnosis not present

## 2018-11-13 MED ORDER — FLUCONAZOLE 100 MG PO TABS: 100.0000 mg | ORAL_TABLET | Freq: Every day | ORAL | 0 refills | Status: DC

## 2018-11-13 MED ORDER — MAGIC MOUTHWASH W/LIDOCAINE: 5.0000 mL | Freq: Three times a day (TID) | ORAL | 0 refills | Status: AC | PRN

## 2018-11-13 NOTE — Progress Notes (Signed)
Received call from pt stating she has been experiencing mouth sores x2 weeks with white patches in her mouth.  Per Dr. Lindi Adie pt to receive Diflucan 100 mg daily x3 days and magic mouth wash.  Pt educated and verbalized understanding and will call office on Monday for an update.  Script sent to pt pharmacy.

## 2018-11-16 ENCOUNTER — Other Ambulatory Visit: Payer: Self-pay

## 2018-11-16 ENCOUNTER — Ambulatory Visit
Admission: RE | Admit: 2018-11-16 | Discharge: 2018-11-16 | Disposition: A | Discharge status: Home / Self Care | Payer: 59 | Source: Ambulatory Visit | Attending: Radiation Oncology | Admitting: Radiation Oncology

## 2018-11-16 DIAGNOSIS — C50512 Malignant neoplasm of lower-outer quadrant of left female breast: Secondary | ICD-10-CM | POA: Insufficient documentation

## 2018-11-16 DIAGNOSIS — Z170 Estrogen receptor positive status [ER+]: Secondary | ICD-10-CM | POA: Insufficient documentation

## 2018-11-16 DIAGNOSIS — C7951 Secondary malignant neoplasm of bone: Secondary | ICD-10-CM | POA: Diagnosis not present

## 2018-11-16 DIAGNOSIS — Z510 Encounter for antineoplastic radiation therapy: Secondary | ICD-10-CM | POA: Diagnosis not present

## 2018-11-16 NOTE — Progress Notes (Signed)
Patient Care Team: Redmon, Barth Kirks, PA-C as PCP - General (Nurse Practitioner)  DIAGNOSIS:    ICD-10-CM   1. Malignant neoplasm of lower-outer quadrant of left breast of female, estrogen receptor positive ([place])  C50.512    Z17.0     SUMMARY OF ONCOLOGIC HISTORY: Oncology History  Breast cancer of lower-outer quadrant of left female breast ([place])  12/13/2008 Initial Diagnosis   Left breast biopsy: Invasive ductal carcinoma ER 90% percent, PR 41%, Ki-67 11%, HER-2 negative ratio 1, Oncotype DX score 23, ROR15%   01/27/2009 - 03/10/2009 Neo-Adjuvant Chemotherapy   Neoadjuvant FEC 4; participant in the "bed sheets" study.   05/12/2009 -  Anti-estrogen oral therapy   Femara 2.5 mg daily   05/22/2009 Surgery   Bilateral mastectomy stage IIB T2 N0 M0 IDC left breast grade 1, 2.5 cm, ER 99%, PR 41%, Ki-67 11%, HER-2 negative   12/10/2009 Surgery   Breast reconstruction surgery   11/14/2017 Relapse/Recurrence   Mid back pain: MRI revealed T11 severe pathologic fracture, abnormal signal T10, T11 and T12, moderate canal stenosis at T11   12/03/2017 PET scan   Hypermetabolic metastatic breast cancer involving thoracic and upper abdominal lymph nodes, spine and ribs. There may be hypermetabolic adenopathy in the left neck; Hypermetabolic lymph node versus intramuscular metastasis involving the medial left pectoralis musculature Pathologic T11 fracture     12/11/2017 Procedure   Biopsy of T11 vertebral body lytic lesion: Metastatic breast adenocarcinoma ER positive, PR negative   12/19/2017 - 12/26/2017 Radiation Therapy   Palliative XRT to T 11   01/12/2018 -  Anti-estrogen oral therapy   Patient could not tolerate Faslodex because of severe hypotension and severe pain in the legs after injections.  Ibrance with letrozole starting 01/12/2018   Malignant neoplasm of lower-outer quadrant of left breast of female, estrogen receptor positive ([place])  10/21/2018 Initial Diagnosis   Malignant neoplasm  of lower-outer quadrant of left breast of female, estrogen receptor positive ([place])   10/27/2018 -  Chemotherapy   The patient had eriBULin mesylate (HALAVEN) 1.65 mg in sodium chloride 0.9 % 100 mL chemo infusion, 1.1 mg/m2 = 2.1 mg, Intravenous,  Once, 1 of 4 cycles Dose modification: 1.1 mg/m2 (original dose 1.4 mg/m2, Cycle 1, Reason: Other (see comments), Comment: CrCL < 50) Administration: 1.65 mg (10/27/2018), 1.65 mg (11/03/2018)  for chemotherapy treatment.      CHIEF COMPLIANT: Follow-up of metastatic breast cancer on Halaven, today cycle 2 day 1  INTERVAL HISTORY: Elizabeth [name] is a 63 y.o. with above-mentioned history of metastatic breast cancerwhohas had recurrent pleural effusions.She is currently on treatment with Halaven. She presents to the clinic today for treatment.  Today is cycle 2 of chemotherapy.  She does note that the pain is so much better since radiation began.  Pain medication also assisting her.  Neuropathic discomfort in the middle of the belly has gone away after the Pleurx catheter was removed.  Her abdominal symptoms are probably related to gastritis and have improved with PPIs.  Her shortness of breath has stabilized.  REVIEW OF SYSTEMS:   Constitutional: Denies fevers, chills or abnormal weight loss Eyes: Denies blurriness of vision Ears, nose, mouth, throat, and face: Denies mucositis or sore throat Respiratory: Shortness of breath to minimal exertion.  Relatively stable at this time. Cardiovascular: Denies palpitation, chest discomfort Gastrointestinal: Denies nausea, heartburn or change in bowel habits Skin: Denies abnormal skin rashes Lymphatics: Denies new lymphadenopathy or easy bruising Neurological: Denies numbness, tingling or new weaknesses, severe  chronic back pain related to metastatic breast cancer Behavioral/Psych: Mood is stable, no new changes  Extremities: No lower extremity edema Breast: denies any pain or lumps or nodules in either  breasts All other systems were reviewed with the patient and are negative.  I have reviewed the past medical history, past surgical history, social history and family history with the patient and they are unchanged from previous note.  ALLERGIES:  is allergic to shellfish allergy and sulfa antibiotics.  MEDICATIONS:  Current Outpatient Medications  Medication Sig Dispense Refill  . Biotin 10000 MCG TABS Take 10,000 mcg by mouth daily.     . Cholecalciferol (VITAMIN D PO) Take 5,000 Units by mouth daily.     . DULoxetine (CYMBALTA) 30 MG capsule TAKE 1 CAPSULE (30 MG TOTAL) BY MOUTH 2 (TWO) TIMES DAILY. 180 capsule 2  . fluconazole (DIFLUCAN) 100 MG tablet Take 1 tablet (100 mg total) by mouth daily. 3 tablet 0  . gabapentin (NEURONTIN) 300 MG capsule TAKE 1 CAPSULE BY MOUTH EVERYDAY AT BEDTIME 90 capsule 2  . levothyroxine (SYNTHROID, LEVOTHROID) 25 MCG tablet Take 25 mcg by mouth daily before breakfast.     . lidocaine-prilocaine (EMLA) cream Apply to affected area once (Patient not taking: Reported on 11/11/2018) 30 g 3  . loperamide (IMODIUM) 2 MG capsule 1 to 2 PO QID prn diarrhea (Patient not taking: Reported on 11/11/2018) 30 capsule 22  . LORazepam (ATIVAN) 0.5 MG tablet Take 1 tablet (0.5 mg total) by mouth every 6 (six) hours as needed (Nausea or vomiting). (Patient not taking: Reported on 11/11/2018) 30 tablet 0  . magic mouthwash w/lidocaine SOLN Take 5 mLs by mouth 3 (three) times daily as needed for mouth pain. 100 mL 0  . Multiple Vitamin (MULTIVITAMIN) tablet Take 1 tablet by mouth daily.    . Omega-3 Fatty Acids (SUPER OMEGA 3 EPA/DHA PO) Take 1 capsule by mouth daily.    Marland Kitchen omeprazole (PRILOSEC) 20 MG capsule Take 1 capsule (20 mg total) by mouth daily. 30 capsule 11  . ondansetron (ZOFRAN) 8 MG tablet Take 1 tablet (8 mg total) by mouth 2 (two) times daily as needed (Nausea or vomiting). (Patient not taking: Reported on 11/11/2018) 30 tablet 1  . oxyCODONE-acetaminophen  (PERCOCET) 10-325 MG tablet Take 1 tablet by mouth every 8 (eight) hours as needed for pain. 90 tablet 0  . prednisoLONE acetate (PRED FORTE) 1 % ophthalmic suspension Place 1 drop into both eyes daily.    . prochlorperazine (COMPAZINE) 10 MG tablet Take 1 tablet (10 mg total) by mouth every 6 (six) hours as needed (Nausea or vomiting). (Patient not taking: Reported on 11/11/2018) 30 tablet 1  . prochlorperazine (COMPAZINE) 5 MG tablet Take 1 tablet (5 mg total) by mouth every 6 (six) hours as needed for nausea or vomiting. (Patient not taking: Reported on 11/11/2018) 45 tablet 2  . simethicone (GAS-X) 80 MG chewable tablet Chew 1 tablet (80 mg total) by mouth every 6 (six) hours as needed for flatulence. (Patient not taking: Reported on 11/11/2018) 120 tablet 2  . traMADol (ULTRAM) 50 MG tablet Take 1 tablet (50 mg total) by mouth every 6 (six) hours as needed. (Patient not taking: Reported on 11/11/2018) 30 tablet 0   No current facility-administered medications for this visit.     PHYSICAL EXAMINATION: ECOG PERFORMANCE STATUS: 2 - Symptomatic, <50% confined to bed  Vitals:   11/17/18 1001  BP: 105/69  Pulse: (!) 118  Resp: 20  Temp: 98.7 F (  37.1 C)  SpO2: 97%   Filed Weights   11/17/18 1001  Weight: 106 lb 3.2 oz (48.2 kg)    GENERAL: alert, no distress and comfortable SKIN: skin color, texture, turgor are normal, no rashes or significant lesions EYES: normal, Conjunctiva are pink and non-injected, sclera clear OROPHARYNX: no exudate, no erythema and lips, buccal mucosa, and tongue normal  NECK: supple, thyroid normal size, non-tender, without nodularity LYMPH: no palpable lymphadenopathy in the cervical, axillary or inguinal LUNGS: clear to auscultation and percussion with normal breathing effort HEART: regular rate & rhythm and no murmurs and no lower extremity edema ABDOMEN: abdomen soft, non-tender and normal bowel sounds MUSCULOSKELETAL: no cyanosis of digits and no clubbing   NEURO: alert & oriented x 3 with fluent speech, no focal motor/sensory deficits EXTREMITIES: No lower extremity edema  LABORATORY DATA:  I have reviewed the data as listed CMP Latest Ref Rng & Units 11/17/2018 11/03/2018 10/27/2018  Glucose 70 - 99 mg/dL 119(H) 138(H) 129(H)  BUN 8 - 23 mg/dL _0 Creatinine 0.44 - 1.00 mg/dL 0.84 0.74 1.00  Sodium 135 - 145 mmol/L 136 139 139  Potassium 3.5 - 5.1 mmol/L 4.4 3.7 3.9  Chloride 98 - 111 mmol/L 101 103 100  CO2 22 - 32 mmol/L _1 Calcium 8.9 - 10.3 mg/dL 9.0 8.5(L) 9.2  Total Protein 6.5 - 8.1 g/dL 7.1 6.5 7.2  Total Bilirubin 0.3 - 1.2 mg/dL 0.2(L) <0.2(L) 0.4  Alkaline Phos 38 - 126 U/L 166(H) 104 128(H)  AST 15 - 41 U/L 26 39 26  ALT 0 - 44 U/L _2 Lab Results  Component Value Date   WBC 7.9 11/17/2018   HGB 9.9 (L) 11/17/2018   HCT 31.9 (L) 11/17/2018   MCV 92.5 11/17/2018   PLT 690 (H) 11/17/2018   NEUTROABS 5.9 11/17/2018    ASSESSMENT & PLAN:  Breast cancer of lower-outer quadrant of left female breast Left breast cancer invasive ductal carcinoma 2.5 cm in size grade 1, ER 99%, PR 41%, Ki-67 11%, HER-2 negative T2 N0 M0 stage II a status post neoadjuvant chemotherapy followed by surgery February 2011and wason antiestrogen therapy with Femara from January 2011-July 2017  New onset mid back pain: MRI revealedT11 severe pathologic fracture, abnormal signal T10, T11 and T12, moderate canal stenosis at T11  12/02/2017: PET CT scan hypermetabolic lymphadenopathy in the thoracic and upper abdominal lymph nodes, spine and rib metastases, intramuscular metastases the left pectoralis muscle, T11 vertebral fracture pathologic  Biopsy of the T11 vertebral body ER 90%, PR 0%, Ki-67 10%, HER-2 -1+, foundation 1, PDL 1  Pathologic fracture T11: She met with orthopedics regarding kyphoplasty and they determined that there is no surgical options available for her.  ------------------------------------------------------------------- Treatment : 1. Palliative radiation to T11 vertebral body and the pectoralis muscle: 12/20/17-12/26/17 2. Ibrance with letrozole along with Delton See or Zometadiscontinued July 2020 due to progression Patient could not tolerate Faslodex because of intense bone pain as well as hypotension. 3.  Current treatment: Halaven started 10/27/2018, day 1 day 8 every 3 weeks, today cycle 2  Severe shortness of breathwith recurrent pleural effusions: Pleurx catheter was removed, cardiothoracic surgery has been consulted.   CT CAP and bone scan 10/19/2018:Worsening left pleural effusion with pleural and pericardial metastases, new hilar lymph node and subcarinal lymph node metastases, new left adrenal metastases. New T5 sclerotic partial compression fracture. Small lesions in the liver.  Severe pain issues due to metastatic  cancer of the bone: Dr. Lisbeth Renshaw giving her additional palliative radiation.  Her pain appears to be doing much better. After 3 cycles of chemo we will obtain CT scans.  No orders of the defined types were placed in this encounter.  The patient has a good understanding of the overall plan. she agrees with it. she will call with any problems that may develop before the next visit here.  Nicholas Lose, MD 11/17/2018  Julious Oka Dorshimer am acting as scribe for Dr. Nicholas Lose.  I have reviewed the above documentation for accuracy and completeness, and I agree with the above.

## 2018-11-17 ENCOUNTER — Other Ambulatory Visit: Payer: Self-pay

## 2018-11-17 ENCOUNTER — Inpatient Hospital Stay: Payer: 59 | Attending: Hematology and Oncology

## 2018-11-17 ENCOUNTER — Inpatient Hospital Stay (HOSPITAL_BASED_OUTPATIENT_CLINIC_OR_DEPARTMENT_OTHER): Payer: 59 | Admitting: Hematology and Oncology

## 2018-11-17 ENCOUNTER — Other Ambulatory Visit: Payer: Self-pay | Admitting: Hematology and Oncology

## 2018-11-17 ENCOUNTER — Inpatient Hospital Stay: Payer: 59

## 2018-11-17 ENCOUNTER — Ambulatory Visit
Admission: RE | Admit: 2018-11-17 | Discharge: 2018-11-17 | Disposition: A | Discharge status: Home / Self Care | Payer: 59 | Source: Ambulatory Visit | Attending: Radiation Oncology | Admitting: Radiation Oncology

## 2018-11-17 VITALS — HR 108

## 2018-11-17 DIAGNOSIS — R000 Tachycardia, unspecified: Secondary | ICD-10-CM | POA: Diagnosis not present

## 2018-11-17 DIAGNOSIS — Z79811 Long term (current) use of aromatase inhibitors: Secondary | ICD-10-CM | POA: Insufficient documentation

## 2018-11-17 DIAGNOSIS — C7989 Secondary malignant neoplasm of other specified sites: Secondary | ICD-10-CM | POA: Diagnosis not present

## 2018-11-17 DIAGNOSIS — J90 Pleural effusion, not elsewhere classified: Secondary | ICD-10-CM | POA: Diagnosis not present

## 2018-11-17 DIAGNOSIS — Z9013 Acquired absence of bilateral breasts and nipples: Secondary | ICD-10-CM | POA: Diagnosis not present

## 2018-11-17 DIAGNOSIS — C50512 Malignant neoplasm of lower-outer quadrant of left female breast: Secondary | ICD-10-CM

## 2018-11-17 DIAGNOSIS — Z5111 Encounter for antineoplastic chemotherapy: Secondary | ICD-10-CM | POA: Insufficient documentation

## 2018-11-17 DIAGNOSIS — G893 Neoplasm related pain (acute) (chronic): Secondary | ICD-10-CM | POA: Diagnosis not present

## 2018-11-17 DIAGNOSIS — Z79899 Other long term (current) drug therapy: Secondary | ICD-10-CM | POA: Diagnosis not present

## 2018-11-17 DIAGNOSIS — C778 Secondary and unspecified malignant neoplasm of lymph nodes of multiple regions: Secondary | ICD-10-CM | POA: Diagnosis not present

## 2018-11-17 DIAGNOSIS — J910 Malignant pleural effusion: Secondary | ICD-10-CM | POA: Diagnosis not present

## 2018-11-17 DIAGNOSIS — Z923 Personal history of irradiation: Secondary | ICD-10-CM | POA: Diagnosis not present

## 2018-11-17 DIAGNOSIS — Z170 Estrogen receptor positive status [ER+]: Secondary | ICD-10-CM | POA: Diagnosis not present

## 2018-11-17 DIAGNOSIS — R0602 Shortness of breath: Secondary | ICD-10-CM | POA: Diagnosis not present

## 2018-11-17 DIAGNOSIS — C7951 Secondary malignant neoplasm of bone: Secondary | ICD-10-CM | POA: Diagnosis present

## 2018-11-17 DIAGNOSIS — Z7189 Other specified counseling: Secondary | ICD-10-CM

## 2018-11-17 DIAGNOSIS — R5383 Other fatigue: Secondary | ICD-10-CM | POA: Insufficient documentation

## 2018-11-17 LAB — CBC WITH DIFFERENTIAL (CANCER CENTER ONLY)
Abs Immature Granulocytes: 0.15 10*3/uL — ABNORMAL HIGH (ref 0.00–0.07)
Basophils Absolute: 0.1 10*3/uL (ref 0.0–0.1)
Basophils Relative: 2 %
Eosinophils Absolute: 0 10*3/uL (ref 0.0–0.5)
Eosinophils Relative: 0 %
HCT: 31.9 % — ABNORMAL LOW (ref 36.0–46.0)
Hemoglobin: 9.9 g/dL — ABNORMAL LOW (ref 12.0–15.0)
Immature Granulocytes: 2 %
Lymphocytes Relative: 6 %
Lymphs Abs: 0.5 10*3/uL — ABNORMAL LOW (ref 0.7–4.0)
MCH: 28.7 pg (ref 26.0–34.0)
MCHC: 31 g/dL (ref 30.0–36.0)
MCV: 92.5 fL (ref 80.0–100.0)
Monocytes Absolute: 1.3 10*3/uL — ABNORMAL HIGH (ref 0.1–1.0)
Monocytes Relative: 16 %
Neutro Abs: 5.9 10*3/uL (ref 1.7–7.7)
Neutrophils Relative %: 74 %
Platelet Count: 690 10*3/uL — ABNORMAL HIGH (ref 150–400)
RBC: 3.45 MIL/uL — ABNORMAL LOW (ref 3.87–5.11)
RDW: 21.5 % — ABNORMAL HIGH (ref 11.5–15.5)
WBC Count: 7.9 10*3/uL (ref 4.0–10.5)
nRBC: 0 % (ref 0.0–0.2)

## 2018-11-17 LAB — CMP (CANCER CENTER ONLY)
ALT: 14 U/L (ref 0–44)
AST: 26 U/L (ref 15–41)
Albumin: 2.2 g/dL — ABNORMAL LOW (ref 3.5–5.0)
Alkaline Phosphatase: 166 U/L — ABNORMAL HIGH (ref 38–126)
Anion gap: 9 (ref 5–15)
BUN: 10 mg/dL (ref 8–23)
CO2: 26 mmol/L (ref 22–32)
Calcium: 9 mg/dL (ref 8.9–10.3)
Chloride: 101 mmol/L (ref 98–111)
Creatinine: 0.84 mg/dL (ref 0.44–1.00)
GFR, Est AFR Am: >60 mL/min (ref >60)
GFR, Estimated: >60 mL/min (ref >60)
Glucose, Bld: 119 mg/dL — ABNORMAL HIGH (ref 70–99)
Potassium: 4.4 mmol/L (ref 3.5–5.1)
Sodium: 136 mmol/L (ref 135–145)
Total Bilirubin: 0.2 mg/dL — ABNORMAL LOW (ref 0.3–1.2)
Total Protein: 7.1 g/dL (ref 6.5–8.1)

## 2018-11-17 MED ORDER — SODIUM CHLORIDE 0.9% FLUSH
10.0000 mL | INTRAVENOUS | Status: DC | PRN
  Administered 2018-11-17: 10 mL
  Filled 2018-11-17: qty 10

## 2018-11-17 MED ORDER — PROCHLORPERAZINE MALEATE 10 MG PO TABS
10.0000 mg | ORAL_TABLET | Freq: Once | ORAL | Status: AC
  Administered 2018-11-17: 10 mg via ORAL

## 2018-11-17 MED ORDER — PROCHLORPERAZINE MALEATE 10 MG PO TABS
ORAL_TABLET | ORAL | Status: AC
  Filled 2018-11-17: qty 1

## 2018-11-17 MED ORDER — HEPARIN SOD (PORK) LOCK FLUSH 100 UNIT/ML IV SOLN
500.0000 [IU] | Freq: Once | INTRAVENOUS | Status: AC | PRN
  Administered 2018-11-17: 500 [IU]
  Filled 2018-11-17: qty 5

## 2018-11-17 MED ORDER — SODIUM CHLORIDE 0.9 % IV SOLN
Freq: Once | INTRAVENOUS | Status: AC
  Administered 2018-11-17: 11:00:00 via INTRAVENOUS
  Filled 2018-11-17: qty 250

## 2018-11-17 MED ORDER — SODIUM CHLORIDE 0.9% FLUSH
10.0000 mL | Freq: Once | INTRAVENOUS | Status: AC | PRN
  Administered 2018-11-17: 10 mL
  Filled 2018-11-17: qty 10

## 2018-11-17 MED ORDER — SODIUM CHLORIDE 0.9 % IV SOLN
1.1000 mg/m2 | Freq: Once | INTRAVENOUS | Status: AC
  Administered 2018-11-17: 1.65 mg via INTRAVENOUS
  Filled 2018-11-17: qty 3.3

## 2018-11-17 NOTE — Progress Notes (Signed)
Pulse 108 reported to Dr. Lindi Adie. Received OK to treat.

## 2018-11-17 NOTE — Patient Instructions (Signed)
[facility] Discharge Instructions for Patients Receiving Chemotherapy  Today you received the following chemotherapy agents Halaven  To help prevent nausea and vomiting after your treatment, we encourage you to take your nausea medication as directed If you develop nausea and vomiting that is not controlled by your nausea medication, call the clinic.   BELOW ARE SYMPTOMS THAT SHOULD BE REPORTED IMMEDIATELY:  *FEVER GREATER THAN 100.5 F  *CHILLS WITH OR WITHOUT FEVER  NAUSEA AND VOMITING THAT IS NOT CONTROLLED WITH YOUR NAUSEA MEDICATION  *UNUSUAL SHORTNESS OF BREATH  *UNUSUAL BRUISING OR BLEEDING  TENDERNESS IN MOUTH AND THROAT WITH OR WITHOUT PRESENCE OF ULCERS  *URINARY PROBLEMS  *BOWEL PROBLEMS  UNUSUAL RASH Items with * indicate a potential emergency and should be followed up as soon as possible.  Feel free to call the clinic should you have any questions or concerns. The clinic phone number is [phone].  Please show the CHEMO ALERT CARD at check-in to the Emergency Department and triage nurse.   

## 2018-11-18 ENCOUNTER — Other Ambulatory Visit: Payer: Self-pay

## 2018-11-18 ENCOUNTER — Ambulatory Visit
Admission: RE | Admit: 2018-11-18 | Discharge: 2018-11-18 | Disposition: A | Discharge status: Home / Self Care | Payer: 59 | Source: Ambulatory Visit | Attending: Radiation Oncology | Admitting: Radiation Oncology

## 2018-11-18 DIAGNOSIS — C7951 Secondary malignant neoplasm of bone: Secondary | ICD-10-CM | POA: Diagnosis not present

## 2018-11-19 ENCOUNTER — Other Ambulatory Visit: Payer: Self-pay

## 2018-11-19 ENCOUNTER — Ambulatory Visit
Admission: RE | Admit: 2018-11-19 | Discharge: 2018-11-19 | Disposition: A | Discharge status: Home / Self Care | Payer: 59 | Source: Ambulatory Visit | Attending: Radiation Oncology | Admitting: Radiation Oncology

## 2018-11-19 DIAGNOSIS — C7951 Secondary malignant neoplasm of bone: Secondary | ICD-10-CM | POA: Diagnosis not present

## 2018-11-20 ENCOUNTER — Ambulatory Visit
Admission: RE | Admit: 2018-11-20 | Discharge: 2018-11-20 | Disposition: A | Discharge status: Home / Self Care | Payer: 59 | Source: Ambulatory Visit | Attending: Radiation Oncology | Admitting: Radiation Oncology

## 2018-11-20 ENCOUNTER — Other Ambulatory Visit: Payer: Self-pay

## 2018-11-20 DIAGNOSIS — C7951 Secondary malignant neoplasm of bone: Secondary | ICD-10-CM | POA: Diagnosis not present

## 2018-11-23 ENCOUNTER — Other Ambulatory Visit: Payer: Self-pay

## 2018-11-23 ENCOUNTER — Ambulatory Visit
Admission: RE | Admit: 2018-11-23 | Discharge: 2018-11-23 | Disposition: A | Discharge status: Home / Self Care | Payer: 59 | Source: Ambulatory Visit | Attending: Radiation Oncology | Admitting: Radiation Oncology

## 2018-11-23 DIAGNOSIS — C7951 Secondary malignant neoplasm of bone: Secondary | ICD-10-CM | POA: Diagnosis not present

## 2018-11-24 ENCOUNTER — Inpatient Hospital Stay: Payer: 59

## 2018-11-24 ENCOUNTER — Ambulatory Visit
Admission: RE | Admit: 2018-11-24 | Discharge: 2018-11-24 | Disposition: A | Discharge status: Home / Self Care | Payer: 59 | Source: Ambulatory Visit | Attending: Radiation Oncology | Admitting: Radiation Oncology

## 2018-11-24 ENCOUNTER — Other Ambulatory Visit: Payer: Self-pay

## 2018-11-24 VITALS — BP 99/71 | HR 110 | Temp 98.0°F | Resp 20

## 2018-11-24 DIAGNOSIS — C50512 Malignant neoplasm of lower-outer quadrant of left female breast: Secondary | ICD-10-CM

## 2018-11-24 DIAGNOSIS — Z7189 Other specified counseling: Secondary | ICD-10-CM

## 2018-11-24 DIAGNOSIS — C7951 Secondary malignant neoplasm of bone: Secondary | ICD-10-CM | POA: Diagnosis not present

## 2018-11-24 DIAGNOSIS — Z170 Estrogen receptor positive status [ER+]: Secondary | ICD-10-CM

## 2018-11-24 DIAGNOSIS — Z5111 Encounter for antineoplastic chemotherapy: Secondary | ICD-10-CM | POA: Diagnosis not present

## 2018-11-24 LAB — CBC WITH DIFFERENTIAL (CANCER CENTER ONLY)
Abs Immature Granulocytes: 0.09 10*3/uL — ABNORMAL HIGH (ref 0.00–0.07)
Basophils Absolute: 0.1 10*3/uL (ref 0.0–0.1)
Basophils Relative: 2 %
Eosinophils Absolute: 0.1 10*3/uL (ref 0.0–0.5)
Eosinophils Relative: 2 %
HCT: 30 % — ABNORMAL LOW (ref 36.0–46.0)
Hemoglobin: 9.4 g/dL — ABNORMAL LOW (ref 12.0–15.0)
Immature Granulocytes: 3 %
Lymphocytes Relative: 6 %
Lymphs Abs: 0.2 10*3/uL — ABNORMAL LOW (ref 0.7–4.0)
MCH: 28.7 pg (ref 26.0–34.0)
MCHC: 31.3 g/dL (ref 30.0–36.0)
MCV: 91.7 fL (ref 80.0–100.0)
Monocytes Absolute: 0.3 10*3/uL (ref 0.1–1.0)
Monocytes Relative: 9 %
Neutro Abs: 2.5 10*3/uL (ref 1.7–7.7)
Neutrophils Relative %: 78 %
Platelet Count: 542 10*3/uL — ABNORMAL HIGH (ref 150–400)
RBC: 3.27 MIL/uL — ABNORMAL LOW (ref 3.87–5.11)
RDW: 21.6 % — ABNORMAL HIGH (ref 11.5–15.5)
WBC Count: 3.2 10*3/uL — ABNORMAL LOW (ref 4.0–10.5)
nRBC: 0 % (ref 0.0–0.2)

## 2018-11-24 LAB — CMP (CANCER CENTER ONLY)
ALT: 21 U/L (ref 0–44)
AST: 30 U/L (ref 15–41)
Albumin: 2.6 g/dL — ABNORMAL LOW (ref 3.5–5.0)
Alkaline Phosphatase: 150 U/L — ABNORMAL HIGH (ref 38–126)
Anion gap: 10 (ref 5–15)
BUN: 9 mg/dL (ref 8–23)
CO2: 26 mmol/L (ref 22–32)
Calcium: 8.6 mg/dL — ABNORMAL LOW (ref 8.9–10.3)
Chloride: 101 mmol/L (ref 98–111)
Creatinine: 0.68 mg/dL (ref 0.44–1.00)
GFR, Est AFR Am: >60 mL/min (ref >60)
GFR, Estimated: >60 mL/min (ref >60)
Glucose, Bld: 152 mg/dL — ABNORMAL HIGH (ref 70–99)
Potassium: 3.8 mmol/L (ref 3.5–5.1)
Sodium: 137 mmol/L (ref 135–145)
Total Bilirubin: 0.5 mg/dL (ref 0.3–1.2)
Total Protein: 7.2 g/dL (ref 6.5–8.1)

## 2018-11-24 MED ORDER — SODIUM CHLORIDE 0.9% FLUSH
10.0000 mL | Freq: Once | INTRAVENOUS | Status: AC | PRN
  Administered 2018-11-24: 10 mL
  Filled 2018-11-24: qty 10

## 2018-11-24 MED ORDER — PROCHLORPERAZINE MALEATE 10 MG PO TABS
10.0000 mg | ORAL_TABLET | Freq: Once | ORAL | Status: AC
  Administered 2018-11-24: 10 mg via ORAL

## 2018-11-24 MED ORDER — SODIUM CHLORIDE 0.9% FLUSH
10.0000 mL | INTRAVENOUS | Status: DC | PRN
  Administered 2018-11-24: 10 mL
  Filled 2018-11-24: qty 10

## 2018-11-24 MED ORDER — SODIUM CHLORIDE 0.9 % IV SOLN
1.1000 mg/m2 | Freq: Once | INTRAVENOUS | Status: AC
  Administered 2018-11-24: 11:00:00 1.65 mg via INTRAVENOUS
  Filled 2018-11-24: qty 3.3

## 2018-11-24 MED ORDER — PROCHLORPERAZINE MALEATE 10 MG PO TABS
ORAL_TABLET | ORAL | Status: AC
  Filled 2018-11-24: qty 1

## 2018-11-24 MED ORDER — HEPARIN SOD (PORK) LOCK FLUSH 100 UNIT/ML IV SOLN
500.0000 [IU] | Freq: Once | INTRAVENOUS | Status: AC | PRN
  Administered 2018-11-24: 500 [IU]
  Filled 2018-11-24: qty 5

## 2018-11-24 MED ORDER — SODIUM CHLORIDE 0.9 % IV SOLN
Freq: Once | INTRAVENOUS | Status: AC
  Administered 2018-11-24: 10:00:00 via INTRAVENOUS
  Filled 2018-11-24: qty 250

## 2018-11-24 NOTE — Patient Instructions (Signed)

## 2018-11-24 NOTE — Patient Instructions (Signed)
[facility] Discharge Instructions for Patients Receiving Chemotherapy  Today you received the following chemotherapy agents Halaven  To help prevent nausea and vomiting after your treatment, we encourage you to take your nausea medication as directed If you develop nausea and vomiting that is not controlled by your nausea medication, call the clinic.   BELOW ARE SYMPTOMS THAT SHOULD BE REPORTED IMMEDIATELY:  *FEVER GREATER THAN 100.5 F  *CHILLS WITH OR WITHOUT FEVER  NAUSEA AND VOMITING THAT IS NOT CONTROLLED WITH YOUR NAUSEA MEDICATION  *UNUSUAL SHORTNESS OF BREATH  *UNUSUAL BRUISING OR BLEEDING  TENDERNESS IN MOUTH AND THROAT WITH OR WITHOUT PRESENCE OF ULCERS  *URINARY PROBLEMS  *BOWEL PROBLEMS  UNUSUAL RASH Items with * indicate a potential emergency and should be followed up as soon as possible.  Feel free to call the clinic should you have any questions or concerns. The clinic phone number is [phone].  Please show the CHEMO ALERT CARD at check-in to the Emergency Department and triage nurse.   

## 2018-11-24 NOTE — Progress Notes (Signed)
Nutrition Assessment   Reason for Assessment:   Patient identified on Malnutrition screening report for poor appetite and weight loss.   ASSESSMENT:  63 year old female with metastatic left breast cancer.  Patient with recurrent pleural effusions and pleurx cath has been removed.  Patient has received palliative radiation to T 11 vertebrae.    Spoke with patient during infusion.  Patient reports that appetite has been poor from January through July due to accumulating fluid from pleural effusions.  The fluid causes patient to feel full and only able to eat small amounts of food.  Eating graham crackers during visit.  Reports often times she eats cereal with whole milk when nothing else will work.  Reports that there is no typical day.  Reports friend brought her cube steak with green beans and mashed potatoes recently and she ate small amount of each things.  Reports the ensure/boost shakes make her feel so full that she has stopped drinking them.     Nutrition Focused Physical Exam: deferred   Medications: prilosec, gas-x, vit D, zofran, compazine   Labs: glucose 152,    Anthropometrics:   Height: 63.5 inches Weight: 106 lb 3.2 oz UBW: 114-116 lb per pt Noted 09/16/2018 118 lb BMI: 18  10% weight loss in the last 2 months, significant   Estimated Energy Needs  Kcals: 1400-1600 calories Protein: 70-80 g Fluid: 1.6 L   NUTRITION DIAGNOSIS: Inadequate oral intake related to pleural effusion, cancer as evidenced by 10% weight loss in the last 2 months   INTERVENTION:  Discussed ways to add calories and protein into current diet. Handout provided Encouraged small frequent meals Contact information provided   MONITORING, EVALUATION, GOAL: Patient will consume adequate calories and protein to prevent weight loss   Next Visit: Tuesday, Sept 1 during infusion  Wadell Craddock B. Zenia Resides, [place], [place] Registered Dietitian [phone] (pager)

## 2018-11-24 NOTE — Progress Notes (Signed)
Dr Lindi Adie informed of new R foot "intermittent sharp pain, no numbness and some tingling". No new orders. Ok to tx with heart rate 110.

## 2018-11-25 ENCOUNTER — Other Ambulatory Visit: Payer: Self-pay

## 2018-11-25 ENCOUNTER — Ambulatory Visit
Admission: RE | Admit: 2018-11-25 | Discharge: 2018-11-25 | Disposition: A | Discharge status: Home / Self Care | Payer: 59 | Source: Ambulatory Visit | Attending: Radiation Oncology | Admitting: Radiation Oncology

## 2018-11-25 DIAGNOSIS — C7951 Secondary malignant neoplasm of bone: Secondary | ICD-10-CM | POA: Diagnosis not present

## 2018-11-26 ENCOUNTER — Ambulatory Visit
Admission: RE | Admit: 2018-11-26 | Discharge: 2018-11-26 | Disposition: A | Discharge status: Home / Self Care | Payer: 59 | Source: Ambulatory Visit | Attending: Radiation Oncology | Admitting: Radiation Oncology

## 2018-11-26 ENCOUNTER — Other Ambulatory Visit: Payer: Self-pay

## 2018-11-26 DIAGNOSIS — C7951 Secondary malignant neoplasm of bone: Secondary | ICD-10-CM | POA: Diagnosis not present

## 2018-11-27 ENCOUNTER — Ambulatory Visit: Payer: 59

## 2018-11-27 ENCOUNTER — Other Ambulatory Visit: Payer: Self-pay

## 2018-11-27 ENCOUNTER — Ambulatory Visit
Admission: RE | Admit: 2018-11-27 | Discharge: 2018-11-27 | Disposition: A | Discharge status: Home / Self Care | Payer: 59 | Source: Ambulatory Visit | Attending: Radiation Oncology | Admitting: Radiation Oncology

## 2018-11-27 DIAGNOSIS — C7951 Secondary malignant neoplasm of bone: Secondary | ICD-10-CM | POA: Diagnosis not present

## 2018-11-30 ENCOUNTER — Other Ambulatory Visit: Payer: Self-pay

## 2018-11-30 ENCOUNTER — Ambulatory Visit
Admission: RE | Admit: 2018-11-30 | Discharge: 2018-11-30 | Disposition: A | Discharge status: Home / Self Care | Payer: 59 | Source: Ambulatory Visit | Attending: Radiation Oncology | Admitting: Radiation Oncology

## 2018-11-30 DIAGNOSIS — C7951 Secondary malignant neoplasm of bone: Secondary | ICD-10-CM | POA: Diagnosis not present

## 2018-12-01 ENCOUNTER — Other Ambulatory Visit: Payer: Self-pay

## 2018-12-01 ENCOUNTER — Ambulatory Visit
Admission: RE | Admit: 2018-12-01 | Discharge: 2018-12-01 | Disposition: A | Discharge status: Home / Self Care | Payer: 59 | Source: Ambulatory Visit | Attending: Radiation Oncology | Admitting: Radiation Oncology

## 2018-12-01 DIAGNOSIS — C7951 Secondary malignant neoplasm of bone: Secondary | ICD-10-CM | POA: Diagnosis not present

## 2018-12-01 NOTE — Assessment & Plan Note (Signed)
Left breast cancer invasive ductal carcinoma 2.5 cm in size grade 1, ER 99%, PR 41%, Ki-67 11%, HER-2 negative T2 N0 M0 stage II a status post neoadjuvant chemotherapy followed by surgery February 2011and wason antiestrogen therapy with Femara from January 2011-July 2017  New onset mid back pain: MRI revealedT11 severe pathologic fracture, abnormal signal T10, T11 and T12, moderate canal stenosis at T11  12/02/2017: PET CT scan hypermetabolic lymphadenopathy in the thoracic and upper abdominal lymph nodes, spine and rib metastases, intramuscular metastases the left pectoralis muscle, T11 vertebral fracture pathologic  Biopsy of the T11 vertebral body ER 90%, PR 0%, Ki-67 10%, HER-2 -1+, foundation 1, PDL 1 Pathologic fracture T11: She met with orthopedics regarding kyphoplasty and they determined that there is no surgical options available for her. ------------------------------------------------------------------- Treatment : 1. Palliative radiation to T11 vertebral body and the pectoralis muscle: 12/20/17-12/26/17 2. Ibrance with letrozole along with Delton See or Zometadiscontinued July 2020 due to progression Patient could not tolerate Faslodex because of intense bone pain as well as hypotension. 3.  Current treatment: Halaven started 10/27/2018, day 1 day 8 every 3 weeks, today cycle 3  Severe shortness of breathwith recurrent pleural effusions:Pleurx catheter was removed, cardiothoracic surgery has been consulted.   CT CAP and bone scan 10/19/2018:Worsening left pleural effusion with pleural and pericardial metastases, new hilar lymph node and subcarinal lymph node metastases, new left adrenal metastases. New T5 sclerotic partial compression fracture. Small lesions in the liver.  Severe pain issues due to metastatic cancer of the bone: Dr. Lisbeth Renshaw gave her additional palliative radiation.  Her pain appears to be doing much better. After 3 cycles of chemo we will obtain CT scans.

## 2018-12-02 ENCOUNTER — Ambulatory Visit
Admission: RE | Admit: 2018-12-02 | Discharge: 2018-12-02 | Disposition: A | Discharge status: Home / Self Care | Payer: 59 | Source: Ambulatory Visit | Attending: Radiation Oncology | Admitting: Radiation Oncology

## 2018-12-02 ENCOUNTER — Other Ambulatory Visit: Payer: Self-pay

## 2018-12-02 DIAGNOSIS — C7951 Secondary malignant neoplasm of bone: Secondary | ICD-10-CM | POA: Diagnosis not present

## 2018-12-03 ENCOUNTER — Ambulatory Visit
Admission: RE | Admit: 2018-12-03 | Discharge: 2018-12-03 | Disposition: A | Discharge status: Home / Self Care | Payer: 59 | Source: Ambulatory Visit | Attending: Radiation Oncology | Admitting: Radiation Oncology

## 2018-12-03 ENCOUNTER — Ambulatory Visit: Payer: 59

## 2018-12-03 ENCOUNTER — Encounter: Payer: Self-pay | Admitting: Radiation Oncology

## 2018-12-03 ENCOUNTER — Other Ambulatory Visit: Payer: Self-pay

## 2018-12-03 DIAGNOSIS — C7951 Secondary malignant neoplasm of bone: Secondary | ICD-10-CM | POA: Diagnosis not present

## 2018-12-03 NOTE — Progress Notes (Signed)
  Radiation Oncology         [phone] ________________________________  Name: TYKIERA RAVEN MRN: [id number]  Date: 11/12/2018  DOB: 05-27-1955  SIMULATION AND TREATMENT PLANNING NOTE  DIAGNOSIS:     ICD-10-CM   1. Bone metastasis ([place])  C79.51      Site:   1.. right femur 2.  T5 spine 3.  L-spine  NARRATIVE:  The patient was brought to the [facility].  Identity was confirmed.  All relevant records and images related to the planned course of therapy were reviewed.   Written consent to proceed with treatment was confirmed which was freely given after reviewing the details related to the planned course of therapy had been reviewed with the patient.  Then, the patient was set-up in a stable reproducible  supine position for radiation therapy.  CT images were obtained.  Surface markings were placed.    Medically necessary complex treatment device(s) for immobilization:  Vac-lock bag, wingboard.   The CT images were loaded into the planning software.  Then the target and avoidance structures were contoured.  Treatment planning then occurred.  The radiation prescription was entered and confirmed.  A total of 8 complex treatment devices were fabricated which relate to the designed radiation treatment fields. Each of these customized fields/ complex treatment devices will be used on a daily basis during the radiation course. I have requested : Isodose Plan.   PLAN:  The patient will receive 35 Gy in 14 fractions.    Special treatment procedure The treated area overlaps with a prior area of radiation treatment.  This therefore represents a course of re-irradiation.  The previous treatment will be accounted for in a labor-intensive manner through the planning process.  This will require extra work to account for this, beyond what would be necessary for a standard treatment to this area.  This therefore represents a special treatment procedure.      ________________________________   Jodelle Gross, MD, PhD

## 2018-12-04 ENCOUNTER — Ambulatory Visit: Payer: 59

## 2018-12-07 NOTE — Progress Notes (Signed)
Patient Care Team: [name] as PCP - General (Nurse Practitioner)  DIAGNOSIS:    ICD-10-CM   1. Bone metastasis ([place])  C79.51   2. Malignant neoplasm of lower-outer quadrant of left breast of female, estrogen receptor positive ([place])  C50.512    Z17.0     SUMMARY OF ONCOLOGIC HISTORY: Oncology History  Breast cancer of lower-outer quadrant of left female breast ([place])  12/13/2008 Initial Diagnosis   Left breast biopsy: Invasive ductal carcinoma ER 90% percent, PR 41%, Ki-67 11%, HER-2 negative ratio 1, Oncotype DX score 23, ROR15%   01/27/2009 - 03/10/2009 Neo-Adjuvant Chemotherapy   Neoadjuvant FEC 4; participant in the "bed sheets" study.   05/12/2009 -  Anti-estrogen oral therapy   Femara 2.5 mg daily   05/22/2009 Surgery   Bilateral mastectomy stage IIB T2 N0 M0 IDC left breast grade 1, 2.5 cm, ER 99%, PR 41%, Ki-67 11%, HER-2 negative   12/10/2009 Surgery   Breast reconstruction surgery   11/14/2017 Relapse/Recurrence   Mid back pain: MRI revealed T11 severe pathologic fracture, abnormal signal T10, T11 and T12, moderate canal stenosis at T11   12/03/2017 PET scan   Hypermetabolic metastatic breast cancer involving thoracic and upper abdominal lymph nodes, spine and ribs. There may be hypermetabolic adenopathy in the left neck; Hypermetabolic lymph node versus intramuscular metastasis involving the medial left pectoralis musculature Pathologic T11 fracture     12/11/2017 Procedure   Biopsy of T11 vertebral body lytic lesion: Metastatic breast adenocarcinoma ER positive, PR negative   12/19/2017 - 12/26/2017 Radiation Therapy   Palliative XRT to T 11   01/12/2018 - 10/26/2018 Anti-estrogen oral therapy   Patient could not tolerate Faslodex because of severe hypotension and severe pain in the legs after injections.  Ibrance with letrozole starting 01/12/2018   10/27/2018 -  Chemotherapy   Palliative chemotherapy with Halaven   Malignant neoplasm of lower-outer  quadrant of left breast of female, estrogen receptor positive ([place])  10/21/2018 Initial Diagnosis   Malignant neoplasm of lower-outer quadrant of left breast of female, estrogen receptor positive ([place])   10/27/2018 -  Chemotherapy   The patient had eriBULin mesylate (HALAVEN) 1.65 mg in sodium chloride 0.9 % 100 mL chemo infusion, 1.1 mg/m2 = 2.1 mg, Intravenous,  Once, 2 of 6 cycles Dose modification: 1.1 mg/m2 (original dose 1.4 mg/m2, Cycle 1, Reason: Other (see comments), Comment: CrCL < 50) Administration: 1.65 mg (10/27/2018), 1.65 mg (11/03/2018), 1.65 mg (11/17/2018), 1.65 mg (11/24/2018)  for chemotherapy treatment.    11/17/2018 - 12/03/2018 Radiation Therapy   Palliative radiation to the T5 location of the spine and right femur, 35 Gy in 14 fractions     CHIEF COMPLIANT: Follow-up of metastatic breast cancer on Halaven, cycle 3 day 1  INTERVAL HISTORY: Elizabeth [name] is a 63 y.o. with above-mentioned history of metastatic breast cancerwhohas had recurrent pleural effusions.Sheis currently on treatment with Halaven and completed radiation to the T5 vertebrae and right femur on 12/03/18. She presents to the clinic today for cycle 3.  She continues to be severely fatigued as well as a severe shortness of breath even to walk a few steps.  Because of this her activities have been very limited.  She gets tachycardic even with a brief exercise. Her previous Pleurx catheter was not draining so it had to be removed.  We are requesting cardiothoracic surgery to evaluate her and give an opinion on what can be done to improve her quality of life with this recurrent  malignant pleural effusion.  Fortunately she has not required a thoracentesis recently.  REVIEW OF SYSTEMS:   Constitutional: Denies fevers, chills or abnormal weight loss Eyes: Denies blurriness of vision Ears, nose, mouth, throat, and face: Denies mucositis or sore throat Respiratory: Severe shortness of breath Cardiovascular:  Tachycardia Gastrointestinal: Denies nausea, heartburn or change in bowel habits Skin: Denies abnormal skin rashes Lymphatics: Denies new lymphadenopathy or easy bruising Neurological: Denies numbness, tingling or new weaknesses Behavioral/Psych: Mood is stable, no new changes  Extremities: No lower extremity edema Breast: denies any pain or lumps or nodules in either breasts All other systems were reviewed with the patient and are negative.  I have reviewed the past medical history, past surgical history, social history and family history with the patient and they are unchanged from previous note.  ALLERGIES:  is allergic to shellfish allergy and sulfa antibiotics.  MEDICATIONS:  Current Outpatient Medications  Medication Sig Dispense Refill  . Biotin 10000 MCG TABS Take 10,000 mcg by mouth daily.     . Cholecalciferol (VITAMIN D PO) Take 5,000 Units by mouth daily.     . DULoxetine (CYMBALTA) 30 MG capsule TAKE 1 CAPSULE (30 MG TOTAL) BY MOUTH 2 (TWO) TIMES DAILY. 180 capsule 2  . fluconazole (DIFLUCAN) 100 MG tablet Take 1 tablet (100 mg total) by mouth daily. 3 tablet 0  . gabapentin (NEURONTIN) 300 MG capsule TAKE 1 CAPSULE BY MOUTH EVERYDAY AT BEDTIME 90 capsule 2  . levothyroxine (SYNTHROID, LEVOTHROID) 25 MCG tablet Take 25 mcg by mouth daily before breakfast.     . lidocaine-prilocaine (EMLA) cream Apply to affected area once (Patient not taking: Reported on 11/11/2018) 30 g 3  . loperamide (IMODIUM) 2 MG capsule 1 to 2 PO QID prn diarrhea (Patient not taking: Reported on 11/11/2018) 30 capsule 22  . LORazepam (ATIVAN) 0.5 MG tablet Take 1 tablet (0.5 mg total) by mouth every 6 (six) hours as needed (Nausea or vomiting). (Patient not taking: Reported on 11/11/2018) 30 tablet 0  . magic mouthwash w/lidocaine SOLN Take 5 mLs by mouth 3 (three) times daily as needed for mouth pain. 100 mL 0  . Multiple Vitamin (MULTIVITAMIN) tablet Take 1 tablet by mouth daily.    . Omega-3 Fatty Acids  (SUPER OMEGA 3 EPA/DHA PO) Take 1 capsule by mouth daily.    Marland Kitchen omeprazole (PRILOSEC) 20 MG capsule Take 1 capsule (20 mg total) by mouth daily. 30 capsule 11  . ondansetron (ZOFRAN) 8 MG tablet Take 1 tablet (8 mg total) by mouth 2 (two) times daily as needed (Nausea or vomiting). (Patient not taking: Reported on 11/11/2018) 30 tablet 1  . oxyCODONE-acetaminophen (PERCOCET) 10-325 MG tablet Take 1 tablet by mouth every 8 (eight) hours as needed for pain. 90 tablet 0  . prednisoLONE acetate (PRED FORTE) 1 % ophthalmic suspension Place 1 drop into both eyes daily.    . prochlorperazine (COMPAZINE) 10 MG tablet Take 1 tablet (10 mg total) by mouth every 6 (six) hours as needed (Nausea or vomiting). (Patient not taking: Reported on 11/11/2018) 30 tablet 1  . prochlorperazine (COMPAZINE) 5 MG tablet Take 1 tablet (5 mg total) by mouth every 6 (six) hours as needed for nausea or vomiting. (Patient not taking: Reported on 11/11/2018) 45 tablet 2  . simethicone (GAS-X) 80 MG chewable tablet Chew 1 tablet (80 mg total) by mouth every 6 (six) hours as needed for flatulence. (Patient not taking: Reported on 11/11/2018) 120 tablet 2  . traMADol (ULTRAM) 50 MG  tablet Take 1 tablet (50 mg total) by mouth every 6 (six) hours as needed. (Patient not taking: Reported on 11/11/2018) 30 tablet 0   No current facility-administered medications for this visit.     PHYSICAL EXAMINATION: ECOG PERFORMANCE STATUS: 2 - Symptomatic, <50% confined to bed  Vitals:   12/08/18 0849  BP: 133/90  Pulse: (!) 118  Resp: 19  Temp: 98.5 F (36.9 C)  SpO2: 99%   Filed Weights   12/08/18 0849  Weight: 103 lb 11.2 oz (47 kg)    GENERAL: alert, no distress and comfortable SKIN: skin color, texture, turgor are normal, no rashes or significant lesions EYES: normal, Conjunctiva are pink and non-injected, sclera clear OROPHARYNX: no exudate, no erythema and lips, buccal mucosa, and tongue normal  NECK: supple, thyroid normal size,  non-tender, without nodularity LYMPH: no palpable lymphadenopathy in the cervical, axillary or inguinal LUNGS: Diminished breath sounds HEART: Tachycardia ABDOMEN: abdomen soft, non-tender and normal bowel sounds MUSCULOSKELETAL: no cyanosis of digits and no clubbing  NEURO: alert & oriented x 3 with fluent speech, no focal motor/sensory deficits EXTREMITIES: No lower extremity edema  LABORATORY DATA:  I have reviewed the data as listed CMP Latest Ref Rng & Units 11/24/2018 11/17/2018 11/03/2018  Glucose 70 - 99 mg/dL 152(H) 119(H) 138(H)  BUN 8 - 23 mg/dL '9 10 8  ' Creatinine 0.44 - 1.00 mg/dL 0.68 0.84 0.74  Sodium 135 - 145 mmol/L 137 136 139  Potassium 3.5 - 5.1 mmol/L 3.8 4.4 3.7  Chloride 98 - 111 mmol/L 101 101 103  CO2 22 - 32 mmol/L '26 26 24  ' Calcium 8.9 - 10.3 mg/dL 8.6(L) 9.0 8.5(L)  Total Protein 6.5 - 8.1 g/dL 7.2 7.1 6.5  Total Bilirubin 0.3 - 1.2 mg/dL 0.5 0.2(L) <0.2(L)  Alkaline Phos 38 - 126 U/L 150(H) 166(H) 104  AST 15 - 41 U/L 30 26 39  ALT 0 - 44 U/L '21 14 12    ' Lab Results  Component Value Date   WBC 2.6 (L) 12/08/2018   HGB 8.7 (L) 12/08/2018   HCT 27.9 (L) 12/08/2018   MCV 90.9 12/08/2018   PLT 346 12/08/2018   NEUTROABS 1.9 12/08/2018    ASSESSMENT & PLAN:  Breast cancer of lower-outer quadrant of left female breast Left breast cancer invasive ductal carcinoma 2.5 cm in size grade 1, ER 99%, PR 41%, Ki-67 11%, HER-2 negative T2 N0 M0 stage II a status post neoadjuvant chemotherapy followed by surgery February 2011and wason antiestrogen therapy with Femara from January 2011-July 2017  New onset mid back pain: MRI revealedT11 severe pathologic fracture, abnormal signal T10, T11 and T12, moderate canal stenosis at T11  12/02/2017: PET CT scan hypermetabolic lymphadenopathy in the thoracic and upper abdominal lymph nodes, spine and rib metastases, intramuscular metastases the left pectoralis muscle, T11 vertebral fracture pathologic  Biopsy of the T11  vertebral body ER 90%, PR 0%, Ki-67 10%, HER-2 -1+, foundation 1, PDL 1 Pathologic fracture T11: She met with orthopedics regarding kyphoplasty and they determined that there is no surgical options available for her. ------------------------------------------------------------------- Treatment : 1. Palliative radiation to T11 vertebral body and the pectoralis muscle: 12/20/17-12/26/17 2. Ibrance with letrozole along with Delton See or Zometadiscontinued July 2020 due to progression Patient could not tolerate Faslodex because of intense bone pain as well as hypotension. 3.  Current treatment: Halaven started 10/27/2018, day 1 day 8 every 3 weeks, today cycle 3  Severe shortness of breathwith recurrent pleural effusions:Pleurx catheter was removed, cardiothoracic surgery  has been consulted.   Requested Dr. Roxan Hockey to give an opinion on her malignant pleural effusions to see what options she may have for drainage.  CT CAP and bone scan 10/19/2018:Worsening left pleural effusion with pleural and pericardial metastases, new hilar lymph node and subcarinal lymph node metastases, new left adrenal metastases. New T5 sclerotic partial compression fracture. Small lesions in the liver.  Severe pain issues due to metastatic cancer of the bone: Dr. Lisbeth Renshaw gave her additional palliative radiation.  Her pain appears to be doing much better. I ordered CT chest abdomen pelvis with the water-based contrast. I will see her back on 12/29/2018 with her next chemotherapy treatment and to review the scans.   No orders of the defined types were placed in this encounter.  The patient has a good understanding of the overall plan. she agrees with it. she will call with any problems that may develop before the next visit here.  Nicholas Lose, MD 12/08/2018  Julious Oka Dorshimer am acting as scribe for Dr. Nicholas Lose.  I have reviewed the above documentation for accuracy and completeness, and I agree with the  above.

## 2018-12-08 ENCOUNTER — Other Ambulatory Visit: Payer: Self-pay

## 2018-12-08 ENCOUNTER — Inpatient Hospital Stay (HOSPITAL_BASED_OUTPATIENT_CLINIC_OR_DEPARTMENT_OTHER): Payer: 59 | Admitting: Hematology and Oncology

## 2018-12-08 ENCOUNTER — Inpatient Hospital Stay: Payer: 59

## 2018-12-08 VITALS — BP 133/90 | HR 118 | Temp 98.5°F | Resp 19 | Ht 63.5 in | Wt 103.7 lb

## 2018-12-08 DIAGNOSIS — Z170 Estrogen receptor positive status [ER+]: Secondary | ICD-10-CM

## 2018-12-08 DIAGNOSIS — C50512 Malignant neoplasm of lower-outer quadrant of left female breast: Secondary | ICD-10-CM

## 2018-12-08 DIAGNOSIS — Z7189 Other specified counseling: Secondary | ICD-10-CM

## 2018-12-08 DIAGNOSIS — C7951 Secondary malignant neoplasm of bone: Secondary | ICD-10-CM

## 2018-12-08 DIAGNOSIS — Z5111 Encounter for antineoplastic chemotherapy: Secondary | ICD-10-CM | POA: Diagnosis not present

## 2018-12-08 LAB — CBC WITH DIFFERENTIAL (CANCER CENTER ONLY)
Abs Immature Granulocytes: 0.03 10*3/uL (ref 0.00–0.07)
Basophils Absolute: 0 10*3/uL (ref 0.0–0.1)
Basophils Relative: 1 %
Eosinophils Absolute: 0 10*3/uL (ref 0.0–0.5)
Eosinophils Relative: 0 %
HCT: 27.9 % — ABNORMAL LOW (ref 36.0–46.0)
Hemoglobin: 8.7 g/dL — ABNORMAL LOW (ref 12.0–15.0)
Immature Granulocytes: 1 %
Lymphocytes Relative: 6 %
Lymphs Abs: 0.2 10*3/uL — ABNORMAL LOW (ref 0.7–4.0)
MCH: 28.3 pg (ref 26.0–34.0)
MCHC: 31.2 g/dL (ref 30.0–36.0)
MCV: 90.9 fL (ref 80.0–100.0)
Monocytes Absolute: 0.5 10*3/uL (ref 0.1–1.0)
Monocytes Relative: 21 %
Neutro Abs: 1.9 10*3/uL (ref 1.7–7.7)
Neutrophils Relative %: 71 %
Platelet Count: 346 10*3/uL (ref 150–400)
RBC: 3.07 MIL/uL — ABNORMAL LOW (ref 3.87–5.11)
RDW: 21.3 % — ABNORMAL HIGH (ref 11.5–15.5)
WBC Count: 2.6 10*3/uL — ABNORMAL LOW (ref 4.0–10.5)
nRBC: 0 % (ref 0.0–0.2)

## 2018-12-08 LAB — CMP (CANCER CENTER ONLY)
ALT: 15 U/L (ref 0–44)
AST: 20 U/L (ref 15–41)
Albumin: 2.4 g/dL — ABNORMAL LOW (ref 3.5–5.0)
Alkaline Phosphatase: 152 U/L — ABNORMAL HIGH (ref 38–126)
Anion gap: 12 (ref 5–15)
BUN: 8 mg/dL (ref 8–23)
CO2: 24 mmol/L (ref 22–32)
Calcium: 8.6 mg/dL — ABNORMAL LOW (ref 8.9–10.3)
Chloride: 103 mmol/L (ref 98–111)
Creatinine: 0.7 mg/dL (ref 0.44–1.00)
GFR, Est AFR Am: >60 mL/min (ref >60)
GFR, Estimated: >60 mL/min (ref >60)
Glucose, Bld: 123 mg/dL — ABNORMAL HIGH (ref 70–99)
Potassium: 3.4 mmol/L — ABNORMAL LOW (ref 3.5–5.1)
Sodium: 139 mmol/L (ref 135–145)
Total Bilirubin: 0.2 mg/dL — ABNORMAL LOW (ref 0.3–1.2)
Total Protein: 6.5 g/dL (ref 6.5–8.1)

## 2018-12-08 MED ORDER — SODIUM CHLORIDE 0.9 % IV SOLN
Freq: Once | INTRAVENOUS | Status: AC
  Administered 2018-12-08: 10:00:00 via INTRAVENOUS
  Filled 2018-12-08: qty 250

## 2018-12-08 MED ORDER — PROCHLORPERAZINE MALEATE 10 MG PO TABS
10.0000 mg | ORAL_TABLET | Freq: Once | ORAL | Status: AC
  Administered 2018-12-08: 10 mg via ORAL

## 2018-12-08 MED ORDER — SODIUM CHLORIDE 0.9% FLUSH
3.0000 mL | Freq: Once | INTRAVENOUS | Status: AC | PRN
  Administered 2018-12-08: 3 mL
  Filled 2018-12-08: qty 10

## 2018-12-08 MED ORDER — HEPARIN SOD (PORK) LOCK FLUSH 100 UNIT/ML IV SOLN
500.0000 [IU] | Freq: Once | INTRAVENOUS | Status: AC | PRN
  Administered 2018-12-08: 500 [IU]
  Filled 2018-12-08: qty 5

## 2018-12-08 MED ORDER — PROCHLORPERAZINE MALEATE 10 MG PO TABS
ORAL_TABLET | ORAL | Status: AC
  Filled 2018-12-08: qty 1

## 2018-12-08 MED ORDER — SODIUM CHLORIDE 0.9 % IV SOLN
1.1000 mg/m2 | Freq: Once | INTRAVENOUS | Status: AC
  Administered 2018-12-08: 1.65 mg via INTRAVENOUS
  Filled 2018-12-08: qty 3.3

## 2018-12-08 MED ORDER — SODIUM CHLORIDE 0.9% FLUSH
10.0000 mL | INTRAVENOUS | Status: DC | PRN
  Administered 2018-12-08: 11:00:00 10 mL
  Filled 2018-12-08: qty 10

## 2018-12-08 NOTE — Progress Notes (Signed)
Per Dr. Lindi Adie: OK to treat with elevated HR

## 2018-12-08 NOTE — Patient Instructions (Addendum)
**CT Scan scheduled for 12/28/18 at 2 pm. Have nothing by mouth after 10 am on the 14th. Arrive to radiology by 11:45 am on the 14th. You'll be checked in, and then be given the first bottle of water-based contrast to drink at 12pm. Another bottle will be provided to drink at 1 pm, and then you will have your scan at 2 pm. You'll return to the [facility] on 12/29/18 for an appointment with Dr. Lindi Adie to discuss the results. Call Dr. Geralyn Flash nurse in the meantime if you have any questions/concerns before your scan**  **Dr. Leonarda Salon office will be calling you sometime this week to get you worked into their schedule**  [facility] Discharge Instructions for Patients Receiving Chemotherapy  Today you received the following chemotherapy agents: Eribulin (Halaven)  To help prevent nausea and vomiting after your treatment, we encourage you to take your nausea medication as directed.   If you develop nausea and vomiting that is not controlled by your nausea medication, call the clinic.   BELOW ARE SYMPTOMS THAT SHOULD BE REPORTED IMMEDIATELY:  *FEVER GREATER THAN 100.5 F  *CHILLS WITH OR WITHOUT FEVER  NAUSEA AND VOMITING THAT IS NOT CONTROLLED WITH YOUR NAUSEA MEDICATION  *UNUSUAL SHORTNESS OF BREATH  *UNUSUAL BRUISING OR BLEEDING  TENDERNESS IN MOUTH AND THROAT WITH OR WITHOUT PRESENCE OF ULCERS  *URINARY PROBLEMS  *BOWEL PROBLEMS  UNUSUAL RASH Items with * indicate a potential emergency and should be followed up as soon as possible.  Feel free to call the clinic should you have any questions or concerns. The clinic phone number is [phone].  Please show the [facility] at check-in to the Emergency Department and triage nurse.  Coronavirus (COVID-19) Are you at risk?  Are you at risk for the Coronavirus (COVID-19)?  To be considered HIGH RISK for Coronavirus (COVID-19), you have to meet the following criteria:  . Traveled to [place], [place], [place], [place] or [place]; or in the [place] to [place], [place], [place], or [place]; and have fever, cough, and shortness of breath within the last 2 weeks of travel OR . Been in close contact with a person diagnosed with COVID-19 within the last 2 weeks and have fever, cough, and shortness of breath . IF YOU DO NOT MEET THESE CRITERIA, YOU ARE CONSIDERED LOW RISK FOR COVID-19.  What to do if you are HIGH RISK for COVID-19?  Marland Kitchen If you are having a medical emergency, call 911. . Seek medical care right away. Before you go to a doctor's office, urgent care or emergency department, call ahead and tell them about your recent travel, contact with someone diagnosed with COVID-19, and your symptoms. You should receive instructions from your physician's office regarding next steps of care.  . When you arrive at healthcare provider, tell the healthcare staff immediately you have returned from visiting [place], [place], [place], [place] or [place]; or traveled in the [place] to [place], [place], [place], or [place]; in the last two weeks or you have been in close contact with a person diagnosed with COVID-19 in the last 2 weeks.   . Tell the health care staff about your symptoms: fever, cough and shortness of breath. . After you have been seen by a medical provider, you will be either: o Tested for (COVID-19) and discharged home on quarantine except to seek medical care if symptoms worsen, and asked to  - Stay home and avoid contact with  others until you get your results (4-5 days)  - Avoid travel on public transportation if possible (such as bus, train, or airplane) or o Sent to the Emergency Department by EMS for evaluation, COVID-19 testing, and possible admission depending on your condition and test results.  What to do if you are LOW RISK for COVID-19?  Reduce your risk of any infection by using the same precautions used for avoiding the common cold or flu:  Marland Kitchen Wash your hands  often with soap and warm water for at least 20 seconds.  If soap and water are not readily available, use an alcohol-based hand sanitizer with at least 60% alcohol.  . If coughing or sneezing, cover your mouth and nose by coughing or sneezing into the elbow areas of your shirt or coat, into a tissue or into your sleeve (not your hands). . Avoid shaking hands with others and consider head nods or verbal greetings only. . Avoid touching your eyes, nose, or mouth with unwashed hands.  . Avoid close contact with people who are sick. . Avoid places or events with large numbers of people in one location, like concerts or sporting events. . Carefully consider travel plans you have or are making. . If you are planning any travel outside or inside the [place], visit the CDC's Travelers' Health webpage for the latest health notices. . If you have some symptoms but not all symptoms, continue to monitor at home and seek medical attention if your symptoms worsen. . If you are having a medical emergency, call 911.   [place] / e-Visit: [email or web address]         [facility]: [facility]: [id number]                   [facility]: [phone]

## 2018-12-09 ENCOUNTER — Telehealth: Payer: Self-pay | Admitting: Hematology and Oncology

## 2018-12-09 ENCOUNTER — Telehealth: Payer: Self-pay | Admitting: *Deleted

## 2018-12-09 ENCOUNTER — Other Ambulatory Visit: Payer: Self-pay | Admitting: Hematology and Oncology

## 2018-12-09 MED ORDER — FENTANYL 25 MCG/HR TD PT72: 1.0000 | MEDICATED_PATCH | TRANSDERMAL | 0 refills | Status: DC

## 2018-12-09 MED ORDER — TRAMADOL HCL 50 MG PO TABS: 50.0000 mg | ORAL_TABLET | Freq: Four times a day (QID) | ORAL | 0 refills | Status: DC | PRN

## 2018-12-09 NOTE — Telephone Encounter (Signed)
I talk with patient regarding schedule  

## 2018-12-09 NOTE — Telephone Encounter (Signed)
Received call from pt requesting some sort of pain medication to help ease the constant back pain she experiences.  Pt states tramadol helps some and oxycodone is too strong.  Per Dr. Lindi Adie, script called in to pt pharmacy for Fentanyl patch.  RN educated pt on placement and patch change q3 days.  RN also educated pt to call office if she has any questions.  Pt verbalized understanding.

## 2018-12-10 ENCOUNTER — Other Ambulatory Visit: Payer: Self-pay | Admitting: Thoracic Surgery (Cardiothoracic Vascular Surgery)

## 2018-12-10 DIAGNOSIS — J90 Pleural effusion, not elsewhere classified: Secondary | ICD-10-CM

## 2018-12-11 ENCOUNTER — Encounter: Payer: 59 | Admitting: Thoracic Surgery (Cardiothoracic Vascular Surgery)

## 2018-12-15 ENCOUNTER — Other Ambulatory Visit: Payer: Self-pay

## 2018-12-15 ENCOUNTER — Inpatient Hospital Stay: Payer: 59 | Attending: Hematology and Oncology

## 2018-12-15 ENCOUNTER — Inpatient Hospital Stay: Payer: 59

## 2018-12-15 VITALS — BP 93/71 | HR 116 | Temp 98.3°F | Resp 18 | Wt 102.5 lb

## 2018-12-15 DIAGNOSIS — Z923 Personal history of irradiation: Secondary | ICD-10-CM | POA: Diagnosis not present

## 2018-12-15 DIAGNOSIS — C50512 Malignant neoplasm of lower-outer quadrant of left female breast: Secondary | ICD-10-CM

## 2018-12-15 DIAGNOSIS — Z9013 Acquired absence of bilateral breasts and nipples: Secondary | ICD-10-CM | POA: Insufficient documentation

## 2018-12-15 DIAGNOSIS — Z7189 Other specified counseling: Secondary | ICD-10-CM

## 2018-12-15 DIAGNOSIS — J90 Pleural effusion, not elsewhere classified: Secondary | ICD-10-CM | POA: Insufficient documentation

## 2018-12-15 DIAGNOSIS — D6481 Anemia due to antineoplastic chemotherapy: Secondary | ICD-10-CM | POA: Insufficient documentation

## 2018-12-15 DIAGNOSIS — Z95828 Presence of other vascular implants and grafts: Secondary | ICD-10-CM

## 2018-12-15 DIAGNOSIS — Z5111 Encounter for antineoplastic chemotherapy: Secondary | ICD-10-CM | POA: Insufficient documentation

## 2018-12-15 DIAGNOSIS — C7951 Secondary malignant neoplasm of bone: Secondary | ICD-10-CM | POA: Insufficient documentation

## 2018-12-15 DIAGNOSIS — Z170 Estrogen receptor positive status [ER+]: Secondary | ICD-10-CM | POA: Diagnosis not present

## 2018-12-15 DIAGNOSIS — F1190 Opioid use, unspecified, uncomplicated: Secondary | ICD-10-CM | POA: Insufficient documentation

## 2018-12-15 DIAGNOSIS — Z79899 Other long term (current) drug therapy: Secondary | ICD-10-CM | POA: Diagnosis not present

## 2018-12-15 DIAGNOSIS — Z79811 Long term (current) use of aromatase inhibitors: Secondary | ICD-10-CM | POA: Insufficient documentation

## 2018-12-15 LAB — CMP (CANCER CENTER ONLY)
ALT: 17 U/L (ref 0–44)
AST: 25 U/L (ref 15–41)
Albumin: 2.6 g/dL — ABNORMAL LOW (ref 3.5–5.0)
Alkaline Phosphatase: 166 U/L — ABNORMAL HIGH (ref 38–126)
Anion gap: 12 (ref 5–15)
BUN: 7 mg/dL — ABNORMAL LOW (ref 8–23)
CO2: 25 mmol/L (ref 22–32)
Calcium: 8.8 mg/dL — ABNORMAL LOW (ref 8.9–10.3)
Chloride: 102 mmol/L (ref 98–111)
Creatinine: 0.72 mg/dL (ref 0.44–1.00)
GFR, Est AFR Am: >60 mL/min (ref >60)
GFR, Estimated: >60 mL/min (ref >60)
Glucose, Bld: 133 mg/dL — ABNORMAL HIGH (ref 70–99)
Potassium: 3.1 mmol/L — ABNORMAL LOW (ref 3.5–5.1)
Sodium: 139 mmol/L (ref 135–145)
Total Bilirubin: 0.5 mg/dL (ref 0.3–1.2)
Total Protein: 6.6 g/dL (ref 6.5–8.1)

## 2018-12-15 LAB — CBC WITH DIFFERENTIAL (CANCER CENTER ONLY)
Abs Immature Granulocytes: 0.08 10*3/uL — ABNORMAL HIGH (ref 0.00–0.07)
Basophils Absolute: 0 10*3/uL (ref 0.0–0.1)
Basophils Relative: 1 %
Eosinophils Absolute: 0 10*3/uL (ref 0.0–0.5)
Eosinophils Relative: 0 %
HCT: 27.6 % — ABNORMAL LOW (ref 36.0–46.0)
Hemoglobin: 8.7 g/dL — ABNORMAL LOW (ref 12.0–15.0)
Immature Granulocytes: 3 %
Lymphocytes Relative: 7 %
Lymphs Abs: 0.2 10*3/uL — ABNORMAL LOW (ref 0.7–4.0)
MCH: 28.2 pg (ref 26.0–34.0)
MCHC: 31.5 g/dL (ref 30.0–36.0)
MCV: 89.3 fL (ref 80.0–100.0)
Monocytes Absolute: 0.2 10*3/uL (ref 0.1–1.0)
Monocytes Relative: 10 %
Neutro Abs: 1.9 10*3/uL (ref 1.7–7.7)
Neutrophils Relative %: 79 %
Platelet Count: 287 10*3/uL (ref 150–400)
RBC: 3.09 MIL/uL — ABNORMAL LOW (ref 3.87–5.11)
RDW: 21.4 % — ABNORMAL HIGH (ref 11.5–15.5)
WBC Count: 2.4 10*3/uL — ABNORMAL LOW (ref 4.0–10.5)
nRBC: 0 % (ref 0.0–0.2)

## 2018-12-15 MED ORDER — PROCHLORPERAZINE MALEATE 10 MG PO TABS
10.0000 mg | ORAL_TABLET | Freq: Once | ORAL | Status: AC
  Administered 2018-12-15: 10:00:00 10 mg via ORAL

## 2018-12-15 MED ORDER — SODIUM CHLORIDE 0.9% FLUSH
10.0000 mL | INTRAVENOUS | Status: DC | PRN
  Administered 2018-12-15: 10 mL via INTRAVENOUS
  Filled 2018-12-15: qty 10

## 2018-12-15 MED ORDER — PROCHLORPERAZINE MALEATE 10 MG PO TABS
ORAL_TABLET | ORAL | Status: AC
  Filled 2018-12-15: qty 1

## 2018-12-15 MED ORDER — HEPARIN SOD (PORK) LOCK FLUSH 100 UNIT/ML IV SOLN
500.0000 [IU] | Freq: Once | INTRAVENOUS | Status: AC | PRN
  Administered 2018-12-15: 11:00:00 500 [IU]
  Filled 2018-12-15: qty 5

## 2018-12-15 MED ORDER — HEPARIN SOD (PORK) LOCK FLUSH 100 UNIT/ML IV SOLN
500.0000 [IU] | Freq: Once | INTRAVENOUS | Status: DC
  Filled 2018-12-15: qty 5

## 2018-12-15 MED ORDER — SODIUM CHLORIDE 0.9 % IV SOLN
1.1000 mg/m2 | Freq: Once | INTRAVENOUS | Status: AC
  Administered 2018-12-15: 10:00:00 1.65 mg via INTRAVENOUS
  Filled 2018-12-15: qty 3.3

## 2018-12-15 MED ORDER — SODIUM CHLORIDE 0.9 % IV SOLN
Freq: Once | INTRAVENOUS | Status: AC
  Administered 2018-12-15: 10:00:00 via INTRAVENOUS
  Filled 2018-12-15: qty 250

## 2018-12-15 MED ORDER — SODIUM CHLORIDE 0.9% FLUSH
10.0000 mL | INTRAVENOUS | Status: DC | PRN
  Administered 2018-12-15: 10 mL
  Filled 2018-12-15: qty 10

## 2018-12-15 NOTE — Patient Instructions (Signed)
[facility] Discharge Instructions for Patients Receiving Chemotherapy  Today you received the following chemotherapy agents Halaven  To help prevent nausea and vomiting after your treatment, we encourage you to take your nausea medication as directed If you develop nausea and vomiting that is not controlled by your nausea medication, call the clinic.   BELOW ARE SYMPTOMS THAT SHOULD BE REPORTED IMMEDIATELY:  *FEVER GREATER THAN 100.5 F  *CHILLS WITH OR WITHOUT FEVER  NAUSEA AND VOMITING THAT IS NOT CONTROLLED WITH YOUR NAUSEA MEDICATION  *UNUSUAL SHORTNESS OF BREATH  *UNUSUAL BRUISING OR BLEEDING  TENDERNESS IN MOUTH AND THROAT WITH OR WITHOUT PRESENCE OF ULCERS  *URINARY PROBLEMS  *BOWEL PROBLEMS  UNUSUAL RASH Items with * indicate a potential emergency and should be followed up as soon as possible.  Feel free to call the clinic should you have any questions or concerns. The clinic phone number is [phone].  Please show the CHEMO ALERT CARD at check-in to the Emergency Department and triage nurse.   

## 2018-12-15 NOTE — Progress Notes (Signed)
Per Dr. Lindi Adie, okay for patient to receive treatment today with heart rate of 116.

## 2018-12-15 NOTE — Progress Notes (Signed)
Nutrition  RD planning on nutrition follow-up during infusion today. Infusion completed before RD able to speak with patient.  Follow-up rescheduled to 9/15.  Veretta Sabourin B. Zenia Resides, [place], [place] Registered Dietitian [phone] (pager)

## 2018-12-22 ENCOUNTER — Other Ambulatory Visit: Payer: Self-pay

## 2018-12-22 ENCOUNTER — Ambulatory Visit (HOSPITAL_COMMUNITY)
Admission: RE | Admit: 2018-12-22 | Discharge: 2018-12-22 | Disposition: A | Discharge status: Home / Self Care | Payer: 59 | Source: Ambulatory Visit | Attending: Thoracic Surgery (Cardiothoracic Vascular Surgery) | Admitting: Thoracic Surgery (Cardiothoracic Vascular Surgery)

## 2018-12-22 ENCOUNTER — Institutional Professional Consult (permissible substitution) (INDEPENDENT_AMBULATORY_CARE_PROVIDER_SITE_OTHER): Payer: 59 | Admitting: Thoracic Surgery (Cardiothoracic Vascular Surgery)

## 2018-12-22 ENCOUNTER — Other Ambulatory Visit: Payer: Self-pay | Admitting: Thoracic Surgery (Cardiothoracic Vascular Surgery)

## 2018-12-22 ENCOUNTER — Ambulatory Visit
Admission: RE | Admit: 2018-12-22 | Discharge: 2018-12-22 | Disposition: A | Discharge status: Home / Self Care | Payer: 59 | Source: Ambulatory Visit | Attending: Thoracic Surgery (Cardiothoracic Vascular Surgery) | Admitting: Thoracic Surgery (Cardiothoracic Vascular Surgery)

## 2018-12-22 ENCOUNTER — Encounter: Payer: Self-pay | Admitting: Thoracic Surgery (Cardiothoracic Vascular Surgery)

## 2018-12-22 VITALS — BP 99/71 | HR 140 | Temp 97.8°F | Resp 20 | Ht 63.5 in | Wt 102.0 lb

## 2018-12-22 DIAGNOSIS — Z9889 Other specified postprocedural states: Secondary | ICD-10-CM | POA: Insufficient documentation

## 2018-12-22 DIAGNOSIS — Z853 Personal history of malignant neoplasm of breast: Secondary | ICD-10-CM | POA: Diagnosis not present

## 2018-12-22 DIAGNOSIS — J90 Pleural effusion, not elsewhere classified: Secondary | ICD-10-CM

## 2018-12-22 NOTE — Progress Notes (Signed)
PCP is Redmon, Barth Kirks, PA-C Referring Provider is Nicholas Lose, MD  Chief Complaint  Patient presents with  . Pleural Effusion    Surgical eval with CXR    HPI: Elizabeth [name] sent for consultation regarding a recurrent left pleural effusion.  Elizabeth [name] is a 63 year old woman with stage IV breast cancer with a malignant left pleural effusion.  She was first found to have a pleural effusion in January.  She had multiple thoracenteses.  She ultimately had a Pleurx catheter placed by IR in April.  She was draining about 500 mL twice a week.  She would stop at 500 mL to avoid discomfort.  Says the catheter stopped working.  Attempts were made to lyse the catheter, but it was removed on 10/26/2018.  A CT of the chest on 10/19/2018 showed some areas of loculation in her left pleural effusion.  She has not required another thoracentesis since the catheter was removed.  She says that she was started on a fentanyl patch last week and her symptoms were much better.  However, over the weekend she started having more left-sided pain and her breathing worsened again. Past Medical History:  Diagnosis Date  . Arthritis   . Breast cancer ([place]) 2010   Left  . Chronic sinusitis   . Family history of adverse reaction to anesthesia    sister-nausea/vomiting  . History of chemotherapy 01/2009-03/2009  . Hypercholesteremia   . Hypothyroid    "inactive thyroid"  . Metastasis ([place]) 11/2017   LN  . Metastatic cancer to bone ([place]) 11/2017  . Migraines   . RSD (reflex sympathetic dystrophy)     Past Surgical History:  Procedure Laterality Date  . BREAST RECONSTRUCTION    . EYE SURGERY Bilateral 7/18, 10/18  . IR IMAGING GUIDED PORT INSERTION  10/28/2018  . IR INSTILL VIA CHEST TUBE AGENT FOR FIBRINOLYSIS INI DAY  10/09/2018  . IR PATIENT EVAL TECH 0-60 MINS  09/08/2018  . IR PERC PLEURAL DRAIN W/INDWELL CATH W/IMG GUIDE  08/05/2018  . IR RADIOLOGIST EVAL & MGMT  10/12/2018  . IR REMOVAL OF PLURAL CATH W/CUFF   10/26/2018  . KNEE ARTHROSCOPY Bilateral   . KNEE SURGERY Right    Two surgeries  . MASTECTOMY Bilateral 05/22/2009  . MASTECTOMY Bilateral 2011   restrictions on left arm-no labs/bloodpressure  . NASAL SINUS SURGERY    . PORTA CATH INSERTION  12/2008  . PORTA CATH REMOVAL  05/2009  . ROOT CANAL    . SHOULDER SURGERY Right   . TOTAL HIP ARTHROPLASTY Left 05/26/2017   Procedure: TOTAL HIP ARTHROPLASTY ANTERIOR APPROACH;  Surgeon: Frederik Pear, MD;  Location: [place];  Service: Orthopedics;  Laterality: Left;    Family History  Problem Relation Age of Onset  . Hypertension Mother     Social History Social History   Tobacco Use  . Smoking status: Never Smoker  . Smokeless tobacco: Never Used  Substance Use Topics  . Alcohol use: No  . Drug use: No    Current Outpatient Medications  Medication Sig Dispense Refill  . Biotin 10000 MCG TABS Take 10,000 mcg by mouth daily.     . Cholecalciferol (VITAMIN D PO) Take 5,000 Units by mouth daily.     . DULoxetine (CYMBALTA) 30 MG capsule TAKE 1 CAPSULE (30 MG TOTAL) BY MOUTH 2 (TWO) TIMES DAILY. 180 capsule 2  . fentaNYL (DURAGESIC) 25 MCG/HR Place 1 patch onto the skin every 3 (three) days. 10 patch 0  . fluconazole (  DIFLUCAN) 100 MG tablet Take 1 tablet (100 mg total) by mouth daily. 3 tablet 0  . gabapentin (NEURONTIN) 300 MG capsule TAKE 1 CAPSULE BY MOUTH EVERYDAY AT BEDTIME 90 capsule 2  . levothyroxine (SYNTHROID, LEVOTHROID) 25 MCG tablet Take 25 mcg by mouth daily before breakfast.     . lidocaine-prilocaine (EMLA) cream Apply to affected area once 30 g 3  . loperamide (IMODIUM) 2 MG capsule 1 to 2 PO QID prn diarrhea 30 capsule 22  . LORazepam (ATIVAN) 0.5 MG tablet Take 1 tablet (0.5 mg total) by mouth every 6 (six) hours as needed (Nausea or vomiting). 30 tablet 0  . magic mouthwash w/lidocaine SOLN Take 5 mLs by mouth 3 (three) times daily as needed for mouth pain. 100 mL 0  . Multiple Vitamin (MULTIVITAMIN) tablet Take 1  tablet by mouth daily.    . Omega-3 Fatty Acids (SUPER OMEGA 3 EPA/DHA PO) Take 1 capsule by mouth daily.    Marland Kitchen omeprazole (PRILOSEC) 20 MG capsule Take 1 capsule (20 mg total) by mouth daily. 30 capsule 11  . ondansetron (ZOFRAN) 8 MG tablet Take 1 tablet (8 mg total) by mouth 2 (two) times daily as needed (Nausea or vomiting). 30 tablet 1  . oxyCODONE-acetaminophen (PERCOCET) 10-325 MG tablet Take 1 tablet by mouth every 8 (eight) hours as needed for pain. 90 tablet 0  . prednisoLONE acetate (PRED FORTE) 1 % ophthalmic suspension Place 1 drop into both eyes daily.    . prochlorperazine (COMPAZINE) 10 MG tablet Take 1 tablet (10 mg total) by mouth every 6 (six) hours as needed (Nausea or vomiting). 30 tablet 1  . prochlorperazine (COMPAZINE) 5 MG tablet Take 1 tablet (5 mg total) by mouth every 6 (six) hours as needed for nausea or vomiting. 45 tablet 2  . simethicone (GAS-X) 80 MG chewable tablet Chew 1 tablet (80 mg total) by mouth every 6 (six) hours as needed for flatulence. 120 tablet 2  . traMADol (ULTRAM) 50 MG tablet Take 1 tablet (50 mg total) by mouth every 6 (six) hours as needed. 60 tablet 0   No current facility-administered medications for this visit.     Allergies  Allergen Reactions  . Shellfish Allergy Anaphylaxis and Other (See Comments)    Allergist said she was "highly allergic to shellfish"  . Sulfa Antibiotics Rash and Other (See Comments)    Looks like severe sunburn    Review of Systems  Constitutional: Positive for activity change and unexpected weight change (Has lost 30 pounds in 3 months).  HENT: Negative for trouble swallowing and voice change.   Eyes: Negative for visual disturbance.  Respiratory: Positive for chest tightness (Left side) and shortness of breath.   Cardiovascular: Positive for palpitations. Negative for leg swelling.  Musculoskeletal:       Bone pain from metastases  Hematological: Negative for adenopathy. Bruises/bleeds easily.    BP  99/71   Pulse (!) 140   Temp 97.8 F (36.6 C) (Skin)   Resp 20   Ht 5' 3.5" (1.613 m)   Wt 102 lb (46.3 kg)   SpO2 95% Comment: RA  BMI 17.79 kg/m  Physical Exam Vitals signs reviewed.  Constitutional:      General: She is not in acute distress.    Comments: Frail-appearing  HENT:     Head: Normocephalic and atraumatic.  Cardiovascular:     Rate and Rhythm: Tachycardia present.     Heart sounds: Normal heart sounds. No murmur.  Pulmonary:  Effort: Pulmonary effort is normal. No respiratory distress.     Breath sounds: No wheezing or rales.     Comments: Absent breath sounds left base Abdominal:     General: There is no distension.     Palpations: Abdomen is soft.     Tenderness: There is no abdominal tenderness.  Musculoskeletal:        General: No swelling.  Skin:    General: Skin is warm and dry.  Neurological:     General: No focal deficit present.     Mental Status: She is alert and oriented to person, place, and time.     Cranial Nerves: No cranial nerve deficit.     Motor: No weakness.     Gait: Gait normal.    Diagnostic Tests: CHEST - 2 VIEW  COMPARISON:  10/23/2018  FINDINGS: The heart size and mediastinal contours are within normal limits. Interval placement of a right chest port catheter, tip positioned near the superior cavoatrial junction. No significant change in a moderate left pleural effusion with associated atelectasis or consolidation and probable loculated fissural fluid. Redemonstrated high-grade wedge deformity of the T11 vertebral body.  IMPRESSION: No significant change in a moderate left pleural effusion with associated atelectasis or consolidation and probable loculated fissural fluid. No new airspace opacity.   Electronically Signed   By: Eddie Candle M.D.   On: 12/22/2018 16:17 I reviewed the chest x-ray images and concur with the findings noted above.  I also reviewed multiple chest x-rays and CT scans dating back to  January 2020.  Impression: Elizabeth [name] is a 63 year old woman with stage IV breast cancer with a malignant left pleural effusion.  This effusion first was noted back in January.  She had multiple thoracenteses.  She then had a pleural catheter placed in April.  That only functioned for a short period of time before it became nonfunctional around April and finally was removed in July.  A CT in July which showed some loculation although there was a large pocket of fluid posteriorly  Her pleural catheter was removed in July.  She has not had a thoracentesis since then.  Her chest x-ray is relatively unchanged during that period of time.  She does still have a fairly sizable left pleural effusion.  We discussed several options. 1.  Observation 2.  Intermittent thoracentesis 3.  Repeat pleural catheter placement 4.  VATS.  Given that she is still symptomatic, I think we need to do something about the fluid.  It is encouraging that she has not had need for repeated thoracenteses.  It is possible that if we could get the fluid drained then she might not need repeat thoracentesis.  Unfortunately, it is more likely that the fluid would recur.  She is a poor candidate for VATS due to her general frailty.  Her heart rate is 140 at baseline today.  Looks like it was about 120 2 weeks ago.  Her inability to tolerate large volume drainage with thoracentesis or pleural catheters limits the effectiveness of those options.  Overall there is no easy answer at this point.  We need to do another thoracentesis now to see if the lung will reexpand.  There is a possibility that the lung is trapped since the effusion has been stable for a few months.  If the lung is trapped, then our options are limited.   Plan: Left thoracentesis Post drainage chest x-ray to assess lung reexpansion Follow-up in 2 weeks with PA and lateral  chest x-ray  Melrose Nakayama, MD [facility] [phone]  Procedure note  Informed consent obtained Sterile technique used Local anesthesia with 5 mL of 1% lidocaine Left thoracentesis performed.  300 mL of clear amber fluid evacuated.  Patient became lightheaded and developed scapular pain and requested discontinuation of procedure.  Revonda Standard Roxan Hockey, MD [facility] [phone]

## 2018-12-24 ENCOUNTER — Telehealth: Payer: Self-pay | Admitting: Radiation Oncology

## 2018-12-24 NOTE — Telephone Encounter (Signed)
  Radiation Oncology         [phone] ________________________________  Name: Elizabeth [name] MRN: [id number]  Date of Service: 12/24/2018  DOB: [birth year]  Post Treatment Telephone Note  Diagnosis:   Progressive metastatic ER positive breast cancer.  Interval Since Last Radiation: 3 weeks   11/16/2018-12/03/18: The right femur, lumbar spine and thoracic spine were treated to 35 Gy in 14 fractions.  12/20/17-12/26/17: The patient received 30 Gy in 10 fractions to the T11 vertebral body  Narrative:  The patient was contacted today for routine follow-up. During treatment she did very well with radiotherapy and did not have significant desquamation. She reports she is doing better with her pain since completing treatment, the low back still is aggravating at times but she's found the long acting medication to be helpful. She's due for another scan on Monday and to see Dr. Lindi Adie on Tuesday for review and hopefully her infusion.  Impression/Plan: 1. Progressive metastatic ER positive breast cancer. The patient has been doing well since completion of radiotherapy. We discussed that we would be happy to continue to follow her as needed, but she will also continue to follow up with Dr. Lindi Adie in medical oncology.      Carola Rhine, PAC

## 2018-12-28 ENCOUNTER — Ambulatory Visit (HOSPITAL_COMMUNITY)
Admission: RE | Admit: 2018-12-28 | Discharge: 2018-12-28 | Disposition: A | Discharge status: Home / Self Care | Payer: 59 | Source: Ambulatory Visit | Attending: Hematology and Oncology | Admitting: Hematology and Oncology

## 2018-12-28 ENCOUNTER — Other Ambulatory Visit: Payer: Self-pay

## 2018-12-28 DIAGNOSIS — C7951 Secondary malignant neoplasm of bone: Secondary | ICD-10-CM | POA: Insufficient documentation

## 2018-12-28 DIAGNOSIS — C50512 Malignant neoplasm of lower-outer quadrant of left female breast: Secondary | ICD-10-CM | POA: Diagnosis present

## 2018-12-28 DIAGNOSIS — Z170 Estrogen receptor positive status [ER+]: Secondary | ICD-10-CM | POA: Insufficient documentation

## 2018-12-28 MED ORDER — IOHEXOL 300 MG/ML  SOLN
30.0000 mL | Freq: Once | INTRAMUSCULAR | Status: AC | PRN
  Administered 2018-12-28: 30 mL via ORAL

## 2018-12-28 MED ORDER — HEPARIN SOD (PORK) LOCK FLUSH 100 UNIT/ML IV SOLN
INTRAVENOUS | Status: AC
  Administered 2018-12-28: 500 [IU] via INTRAVENOUS
  Filled 2018-12-28: qty 5

## 2018-12-28 MED ORDER — SODIUM CHLORIDE (PF) 0.9 % IJ SOLN
INTRAMUSCULAR | Status: AC
  Filled 2018-12-28: qty 50

## 2018-12-28 MED ORDER — IOHEXOL 300 MG/ML  SOLN
100.0000 mL | Freq: Once | INTRAMUSCULAR | Status: AC | PRN
  Administered 2018-12-28: 100 mL via INTRAVENOUS

## 2018-12-28 MED ORDER — HEPARIN SOD (PORK) LOCK FLUSH 100 UNIT/ML IV SOLN
500.0000 [IU] | Freq: Once | INTRAVENOUS | Status: AC
  Administered 2018-12-28: 14:00:00 500 [IU] via INTRAVENOUS

## 2018-12-28 NOTE — Progress Notes (Signed)
Patient Care Team: Redmon, Barth Kirks, PA-C as PCP - General (Nurse Practitioner)  DIAGNOSIS:    ICD-10-CM   1. Malignant neoplasm of lower-outer quadrant of left breast of female, estrogen receptor positive ([place])  C50.512    Z17.0     SUMMARY OF ONCOLOGIC HISTORY: Oncology History  Breast cancer of lower-outer quadrant of left female breast ([place])  12/13/2008 Initial Diagnosis   Left breast biopsy: Invasive ductal carcinoma ER 90% percent, PR 41%, Ki-67 11%, HER-2 negative ratio 1, Oncotype DX score 23, ROR15%   01/27/2009 - 03/10/2009 Neo-Adjuvant Chemotherapy   Neoadjuvant FEC 4; participant in the "bed sheets" study.   05/12/2009 -  Anti-estrogen oral therapy   Femara 2.5 mg daily   05/22/2009 Surgery   Bilateral mastectomy stage IIB T2 N0 M0 IDC left breast grade 1, 2.5 cm, ER 99%, PR 41%, Ki-67 11%, HER-2 negative   12/10/2009 Surgery   Breast reconstruction surgery   11/14/2017 Relapse/Recurrence   Mid back pain: MRI revealed T11 severe pathologic fracture, abnormal signal T10, T11 and T12, moderate canal stenosis at T11   12/03/2017 PET scan   Hypermetabolic metastatic breast cancer involving thoracic and upper abdominal lymph nodes, spine and ribs. There may be hypermetabolic adenopathy in the left neck; Hypermetabolic lymph node versus intramuscular metastasis involving the medial left pectoralis musculature Pathologic T11 fracture     12/11/2017 Procedure   Biopsy of T11 vertebral body lytic lesion: Metastatic breast adenocarcinoma ER positive, PR negative   12/19/2017 - 12/26/2017 Radiation Therapy   Palliative XRT to T 11   01/12/2018 - 10/26/2018 Anti-estrogen oral therapy   Patient could not tolerate Faslodex because of severe hypotension and severe pain in the legs after injections.  Ibrance with letrozole starting 01/12/2018   10/27/2018 -  Chemotherapy   Palliative chemotherapy with Halaven   Malignant neoplasm of lower-outer quadrant of left breast of female,  estrogen receptor positive ([place])  10/21/2018 Initial Diagnosis   Malignant neoplasm of lower-outer quadrant of left breast of female, estrogen receptor positive ([place])   10/27/2018 -  Chemotherapy   The patient had eriBULin mesylate (HALAVEN) 1.65 mg in sodium chloride 0.9 % 100 mL chemo infusion, 1.1 mg/m2 = 2.1 mg, Intravenous,  Once, 3 of 6 cycles Dose modification: 1.1 mg/m2 (original dose 1.4 mg/m2, Cycle 1, Reason: Other (see comments), Comment: CrCL < 50) Administration: 1.65 mg (10/27/2018), 1.65 mg (11/03/2018), 1.65 mg (11/17/2018), 1.65 mg (11/24/2018), 1.65 mg (12/08/2018), 1.65 mg (12/15/2018)  for chemotherapy treatment.    11/17/2018 - 12/03/2018 Radiation Therapy   Palliative radiation to the T5 location of the spine and right femur, 35 Gy in 14 fractions     CHIEF COMPLIANT: Follow-up of metastatic breast cancer on Halaven, cycle 4 day 1  INTERVAL HISTORY: Elizabeth [name] is a 63 y.o. with above-mentioned history of metastatic breast cancerwhohas had recurrent pleural effusions.Sheis currently on treatment with Halaven.She was seen in cardiothoracic surgery on 12/22/18 by Dr. Roxan Hockey, who recommended a left thoracentesis as there were limited options. CT CAP on 12/28/18 showed response to treatment, with no new or enlarging osseous metastases, decreased lymphadenopathy, and decrease left adrenal gland metastasis. There was no new or progressive metastatic disease and the left pleural effusion was overall decreased. She presents to the clinic todayfor cycle 4 and to review her scan.  For diarrhea that she gets intermittently she has been using Pepto-Bismol.  REVIEW OF SYSTEMS:   Constitutional: Generalized weakness, using a walker to get around Eyes: Denies blurriness of  vision Ears, nose, mouth, throat, and face: Denies mucositis or sore throat Respiratory: Shortness of breath with minimal exertion Cardiovascular: Denies palpitation, chest discomfort Gastrointestinal: Melanotic  stools, intermittent diarrhea for which she is using Pepto-Bismol. Skin: Denies abnormal skin rashes Lymphatics: Denies new lymphadenopathy or easy bruising Neurological: Denies numbness, tingling or new weaknesses Behavioral/Psych: Mood is stable, no new changes  Extremities: No lower extremity edema Breast: denies any pain or lumps or nodules in either breasts All other systems were reviewed with the patient and are negative.  I have reviewed the past medical history, past surgical history, social history and family history with the patient and they are unchanged from previous note.  ALLERGIES:  is allergic to shellfish allergy and sulfa antibiotics.  MEDICATIONS:  Current Outpatient Medications  Medication Sig Dispense Refill  . Biotin 10000 MCG TABS Take 10,000 mcg by mouth daily.     . Cholecalciferol (VITAMIN D PO) Take 5,000 Units by mouth daily.     . DULoxetine (CYMBALTA) 30 MG capsule TAKE 1 CAPSULE (30 MG TOTAL) BY MOUTH 2 (TWO) TIMES DAILY. 180 capsule 2  . fentaNYL (DURAGESIC) 25 MCG/HR Place 1 patch onto the skin every 3 (three) days. 10 patch 0  . fluconazole (DIFLUCAN) 100 MG tablet Take 1 tablet (100 mg total) by mouth daily. 3 tablet 0  . gabapentin (NEURONTIN) 300 MG capsule TAKE 1 CAPSULE BY MOUTH EVERYDAY AT BEDTIME 90 capsule 2  . levothyroxine (SYNTHROID, LEVOTHROID) 25 MCG tablet Take 25 mcg by mouth daily before breakfast.     . lidocaine-prilocaine (EMLA) cream Apply to affected area once 30 g 3  . loperamide (IMODIUM) 2 MG capsule 1 to 2 PO QID prn diarrhea 30 capsule 22  . LORazepam (ATIVAN) 0.5 MG tablet Take 1 tablet (0.5 mg total) by mouth every 6 (six) hours as needed (Nausea or vomiting). 30 tablet 0  . magic mouthwash w/lidocaine SOLN Take 5 mLs by mouth 3 (three) times daily as needed for mouth pain. 100 mL 0  . Multiple Vitamin (MULTIVITAMIN) tablet Take 1 tablet by mouth daily.    . Omega-3 Fatty Acids (SUPER OMEGA 3 EPA/DHA PO) Take 1 capsule by mouth  daily.    Marland Kitchen omeprazole (PRILOSEC) 20 MG capsule Take 1 capsule (20 mg total) by mouth daily. 30 capsule 11  . ondansetron (ZOFRAN) 8 MG tablet Take 1 tablet (8 mg total) by mouth 2 (two) times daily as needed (Nausea or vomiting). 30 tablet 1  . oxyCODONE-acetaminophen (PERCOCET) 10-325 MG tablet Take 1 tablet by mouth every 8 (eight) hours as needed for pain. 90 tablet 0  . prednisoLONE acetate (PRED FORTE) 1 % ophthalmic suspension Place 1 drop into both eyes daily.    . prochlorperazine (COMPAZINE) 10 MG tablet Take 1 tablet (10 mg total) by mouth every 6 (six) hours as needed (Nausea or vomiting). 30 tablet 1  . prochlorperazine (COMPAZINE) 5 MG tablet Take 1 tablet (5 mg total) by mouth every 6 (six) hours as needed for nausea or vomiting. 45 tablet 2  . simethicone (GAS-X) 80 MG chewable tablet Chew 1 tablet (80 mg total) by mouth every 6 (six) hours as needed for flatulence. 120 tablet 2  . traMADol (ULTRAM) 50 MG tablet Take 1 tablet (50 mg total) by mouth every 6 (six) hours as needed. 60 tablet 0   No current facility-administered medications for this visit.     PHYSICAL EXAMINATION: ECOG PERFORMANCE STATUS: 1 - Symptomatic but completely ambulatory  There were no  vitals filed for this visit. There were no vitals filed for this visit.  GENERAL: alert, no distress and comfortable SKIN: skin color, texture, turgor are normal, no rashes or significant lesions EYES: normal, Conjunctiva are pink and non-injected, sclera clear OROPHARYNX: no exudate, no erythema and lips, buccal mucosa, and tongue normal  NECK: supple, thyroid normal size, non-tender, without nodularity LYMPH: no palpable lymphadenopathy in the cervical, axillary or inguinal LUNGS: clear to auscultation and percussion with normal breathing effort HEART: regular rate & rhythm and no murmurs and no lower extremity edema ABDOMEN: abdomen soft, non-tender and normal bowel sounds MUSCULOSKELETAL: no cyanosis of digits and  no clubbing  NEURO: alert & oriented x 3 with fluent speech, no focal motor/sensory deficits EXTREMITIES: No lower extremity edema  LABORATORY DATA:  I have reviewed the data as listed CMP Latest Ref Rng & Units 12/15/2018 12/08/2018 11/24/2018  Glucose 70 - 99 mg/dL 133(H) 123(H) 152(H)  BUN 8 - 23 mg/dL 7(L) 8 9  Creatinine 0.44 - 1.00 mg/dL 0.72 0.70 0.68  Sodium 135 - 145 mmol/L 139 139 137  Potassium 3.5 - 5.1 mmol/L 3.1(L) 3.4(L) 3.8  Chloride 98 - 111 mmol/L 102 103 101  CO2 22 - 32 mmol/L _0 Calcium 8.9 - 10.3 mg/dL 8.8(L) 8.6(L) 8.6(L)  Total Protein 6.5 - 8.1 g/dL 6.6 6.5 7.2  Total Bilirubin 0.3 - 1.2 mg/dL 0.5 0.2(L) 0.5  Alkaline Phos 38 - 126 U/L 166(H) 152(H) 150(H)  AST 15 - 41 U/L _1 ALT 0 - 44 U/L _2 Lab Results  Component Value Date   WBC 2.3 (L) 12/29/2018   HGB 8.0 (L) 12/29/2018   HCT 26.1 (L) 12/29/2018   MCV 89.4 12/29/2018   PLT 397 12/29/2018   NEUTROABS 1.4 (L) 12/29/2018    ASSESSMENT & PLAN:  Breast cancer of lower-outer quadrant of left female breast Left breast cancer invasive ductal carcinoma 2.5 cm in size grade 1, ER 99%, PR 41%, Ki-67 11%, HER-2 negative T2 N0 M0 stage II a status post neoadjuvant chemotherapy followed by surgery February 2011and wason antiestrogen therapy with Femara from January 2011-July 2017  New onset mid back pain: MRI revealedT11 severe pathologic fracture, abnormal signal T10, T11 and T12, moderate canal stenosis at T11  12/02/2017: PET CT scan hypermetabolic lymphadenopathy in the thoracic and upper abdominal lymph nodes, spine and rib metastases, intramuscular metastases the left pectoralis muscle, T11 vertebral fracture pathologic  Biopsy of the T11 vertebral body ER 90%, PR 0%, Ki-67 10%, HER-2 -1+, foundation 1, PDL 1 Pathologic fracture T11: She met with orthopedics regarding kyphoplasty and they determined that there is no surgical options available for her.  ------------------------------------------------------------------- Treatment : 1. Palliative radiation to T11 vertebral body and the pectoralis muscle: 12/20/17-12/26/17 2. Ibrance with letrozole along with Delton See or Zometadiscontinued July 2020 due to progression Patient could not tolerate Faslodex because of intense bone pain as well as hypotension. 3.Current treatment: Halaven started 10/27/2018, day 1 and 8 every 3 weeks, today cycle 4, being changed to once every 3 weeks  Severe shortness of breathwith recurrent pleural effusions:Pleurx catheter was removed, cardiothoracic surgery has been consulted.  CT CAP 12/28/2018: Positive response to therapy.  Left axillary, mediastinal and right hilar lymphadenopathy has decreased.  Infiltrative metastases in the upper left retroperitoneum involving the left adrenal gland decreased.  Moderate loculated left pleural effusion overall decreased.  Widespread sclerotic bone metastases without new mets.  Chemotherapy-induced anemia: Hemoglobin is  8.  Because of severe symptoms, I recommended that she get at least 1 unit of PRBC today.  Based upon the favorable CT scan response, we will continue with the current therapy. We will change her chemotherapy to once every 3 weeks.   No orders of the defined types were placed in this encounter.  The patient has a good understanding of the overall plan. she agrees with it. she will call with any problems that may develop before the next visit here.  Nicholas Lose, MD 12/29/2018  Julious Oka Dorshimer am acting as scribe for Dr. Nicholas Lose.  I have reviewed the above documentation for accuracy and completeness, and I agree with the above.

## 2018-12-29 ENCOUNTER — Inpatient Hospital Stay: Payer: 59

## 2018-12-29 ENCOUNTER — Inpatient Hospital Stay (HOSPITAL_BASED_OUTPATIENT_CLINIC_OR_DEPARTMENT_OTHER): Payer: 59 | Admitting: Hematology and Oncology

## 2018-12-29 ENCOUNTER — Other Ambulatory Visit: Payer: Self-pay

## 2018-12-29 ENCOUNTER — Other Ambulatory Visit: Payer: Self-pay | Admitting: *Deleted

## 2018-12-29 VITALS — BP 127/81 | HR 83 | Temp 98.5°F | Resp 16

## 2018-12-29 DIAGNOSIS — C50512 Malignant neoplasm of lower-outer quadrant of left female breast: Secondary | ICD-10-CM | POA: Diagnosis not present

## 2018-12-29 DIAGNOSIS — Z7189 Other specified counseling: Secondary | ICD-10-CM

## 2018-12-29 DIAGNOSIS — E876 Hypokalemia: Secondary | ICD-10-CM

## 2018-12-29 DIAGNOSIS — Z5111 Encounter for antineoplastic chemotherapy: Secondary | ICD-10-CM | POA: Diagnosis not present

## 2018-12-29 DIAGNOSIS — Z170 Estrogen receptor positive status [ER+]: Secondary | ICD-10-CM

## 2018-12-29 LAB — CMP (CANCER CENTER ONLY)
ALT: 9 U/L (ref 0–44)
AST: 20 U/L (ref 15–41)
Albumin: 2.6 g/dL — ABNORMAL LOW (ref 3.5–5.0)
Alkaline Phosphatase: 130 U/L — ABNORMAL HIGH (ref 38–126)
Anion gap: 9 (ref 5–15)
BUN: 6 mg/dL — ABNORMAL LOW (ref 8–23)
CO2: 29 mmol/L (ref 22–32)
Calcium: 8.4 mg/dL — ABNORMAL LOW (ref 8.9–10.3)
Chloride: 102 mmol/L (ref 98–111)
Creatinine: 0.7 mg/dL (ref 0.44–1.00)
GFR, Est AFR Am: >60 mL/min (ref >60)
GFR, Estimated: >60 mL/min (ref >60)
Glucose, Bld: 95 mg/dL (ref 70–99)
Potassium: 3 mmol/L — CL (ref 3.5–5.1)
Sodium: 140 mmol/L (ref 135–145)
Total Bilirubin: 0.4 mg/dL (ref 0.3–1.2)
Total Protein: 6.1 g/dL — ABNORMAL LOW (ref 6.5–8.1)

## 2018-12-29 LAB — CBC WITH DIFFERENTIAL (CANCER CENTER ONLY)
Abs Immature Granulocytes: 0.02 10*3/uL (ref 0.00–0.07)
Basophils Absolute: 0 10*3/uL (ref 0.0–0.1)
Basophils Relative: 1 %
Eosinophils Absolute: 0 10*3/uL (ref 0.0–0.5)
Eosinophils Relative: 0 %
HCT: 26.1 % — ABNORMAL LOW (ref 36.0–46.0)
Hemoglobin: 8 g/dL — ABNORMAL LOW (ref 12.0–15.0)
Immature Granulocytes: 1 %
Lymphocytes Relative: 13 %
Lymphs Abs: 0.3 10*3/uL — ABNORMAL LOW (ref 0.7–4.0)
MCH: 27.4 pg (ref 26.0–34.0)
MCHC: 30.7 g/dL (ref 30.0–36.0)
MCV: 89.4 fL (ref 80.0–100.0)
Monocytes Absolute: 0.5 10*3/uL (ref 0.1–1.0)
Monocytes Relative: 23 %
Neutro Abs: 1.4 10*3/uL — ABNORMAL LOW (ref 1.7–7.7)
Neutrophils Relative %: 62 %
Platelet Count: 397 10*3/uL (ref 150–400)
RBC: 2.92 MIL/uL — ABNORMAL LOW (ref 3.87–5.11)
RDW: 22 % — ABNORMAL HIGH (ref 11.5–15.5)
WBC Count: 2.3 10*3/uL — ABNORMAL LOW (ref 4.0–10.5)
nRBC: 0 % (ref 0.0–0.2)

## 2018-12-29 LAB — PREPARE RBC (CROSSMATCH)

## 2018-12-29 MED ORDER — DIPHENHYDRAMINE HCL 25 MG PO CAPS
25.0000 mg | ORAL_CAPSULE | Freq: Once | ORAL | Status: AC
  Administered 2018-12-29: 25 mg via ORAL

## 2018-12-29 MED ORDER — POTASSIUM CHLORIDE CRYS ER 20 MEQ PO TBCR: 20.0000 meq | EXTENDED_RELEASE_TABLET | Freq: Every day | ORAL | 1 refills | Status: DC

## 2018-12-29 MED ORDER — SODIUM CHLORIDE 0.9 % IV SOLN
1.1000 mg/m2 | Freq: Once | INTRAVENOUS | Status: AC
  Administered 2018-12-29: 12:00:00 1.65 mg via INTRAVENOUS
  Filled 2018-12-29: qty 3.3

## 2018-12-29 MED ORDER — SODIUM CHLORIDE 0.9% FLUSH
3.0000 mL | Freq: Once | INTRAVENOUS | Status: AC | PRN
  Administered 2018-12-29: 3 mL
  Filled 2018-12-29: qty 10

## 2018-12-29 MED ORDER — SODIUM CHLORIDE 0.9% FLUSH
10.0000 mL | INTRAVENOUS | Status: DC | PRN
  Administered 2018-12-29: 15:00:00 10 mL
  Filled 2018-12-29: qty 10

## 2018-12-29 MED ORDER — PROCHLORPERAZINE MALEATE 10 MG PO TABS
ORAL_TABLET | ORAL | Status: AC
  Filled 2018-12-29: qty 1

## 2018-12-29 MED ORDER — ACETAMINOPHEN 325 MG PO TABS
ORAL_TABLET | ORAL | Status: AC
  Filled 2018-12-29: qty 2

## 2018-12-29 MED ORDER — POTASSIUM CHLORIDE CRYS ER 20 MEQ PO TBCR
40.0000 meq | EXTENDED_RELEASE_TABLET | Freq: Once | ORAL | Status: AC
  Administered 2018-12-29: 15:00:00 40 meq via ORAL

## 2018-12-29 MED ORDER — HEPARIN SOD (PORK) LOCK FLUSH 100 UNIT/ML IV SOLN
500.0000 [IU] | Freq: Once | INTRAVENOUS | Status: AC | PRN
  Administered 2018-12-29: 500 [IU]
  Filled 2018-12-29: qty 5

## 2018-12-29 MED ORDER — DIPHENHYDRAMINE HCL 25 MG PO CAPS
ORAL_CAPSULE | ORAL | Status: AC
  Filled 2018-12-29: qty 1

## 2018-12-29 MED ORDER — ACETAMINOPHEN 325 MG PO TABS: 650.0000 mg | ORAL_TABLET | Freq: Once | ORAL | Status: DC

## 2018-12-29 MED ORDER — POTASSIUM CHLORIDE CRYS ER 20 MEQ PO TBCR
EXTENDED_RELEASE_TABLET | ORAL | Status: AC
  Filled 2018-12-29: qty 2

## 2018-12-29 MED ORDER — SODIUM CHLORIDE 0.9 % IV SOLN
Freq: Once | INTRAVENOUS | Status: AC
  Administered 2018-12-29: 11:00:00 via INTRAVENOUS
  Filled 2018-12-29: qty 250

## 2018-12-29 MED ORDER — SODIUM CHLORIDE 0.9% IV SOLUTION
250.0000 mL | Freq: Once | INTRAVENOUS | Status: AC
  Administered 2018-12-29: 250 mL via INTRAVENOUS
  Filled 2018-12-29: qty 250

## 2018-12-29 MED ORDER — PROCHLORPERAZINE MALEATE 10 MG PO TABS
10.0000 mg | ORAL_TABLET | Freq: Once | ORAL | Status: AC
  Administered 2018-12-29: 11:00:00 10 mg via ORAL

## 2018-12-29 NOTE — Progress Notes (Signed)
Nutrition Follow-up:  Patient with metastatic breast cancer with recurrent pleural effusions.  Patient receiving Havalen.  Planning to give treatment q 3 weeks after today.    Spoke with patient during infusion today.  Patient reports that every day is different.  Reports mashed potatoes and fruits have been working well recently. Some meats reports has a funny taste to them. Ensure/boost shakes make her feel to full.      Medications: reviewed  Labs: K 3.0  Anthropometrics:   Weight 103 lb 8 oz today decreased from 106 lb 3.2 oz on 8/11.     NUTRITION DIAGNOSIS: Inadequate oral intake continues   INTERVENTION:  Encouraged eating small amount q 2 hours of high calorie high protein foods. Discussed ways to increase calories and protein in foods that she is eating.  Patient has contact information and encouraged to reach out to RD with questions or concerns.     MONITORING, EVALUATION, GOAL: Patient will consume adequate calories and protein to prevent weight loss   NEXT VISIT: patient to contact RD  Augustine Brannick B. Zenia Resides, [place], [place] Registered Dietitian [phone] (pager)

## 2018-12-29 NOTE — Assessment & Plan Note (Signed)
Left breast cancer invasive ductal carcinoma 2.5 cm in size grade 1, ER 99%, PR 41%, Ki-67 11%, HER-2 negative T2 N0 M0 stage II a status post neoadjuvant chemotherapy followed by surgery February 2011and wason antiestrogen therapy with Femara from January 2011-July 2017  New onset mid back pain: MRI revealedT11 severe pathologic fracture, abnormal signal T10, T11 and T12, moderate canal stenosis at T11  12/02/2017: PET CT scan hypermetabolic lymphadenopathy in the thoracic and upper abdominal lymph nodes, spine and rib metastases, intramuscular metastases the left pectoralis muscle, T11 vertebral fracture pathologic  Biopsy of the T11 vertebral body ER 90%, PR 0%, Ki-67 10%, HER-2 -1+, foundation 1, PDL 1 Pathologic fracture T11: She met with orthopedics regarding kyphoplasty and they determined that there is no surgical options available for her. ------------------------------------------------------------------- Treatment : 1. Palliative radiation to T11 vertebral body and the pectoralis muscle: 12/20/17-12/26/17 2. Ibrance with letrozole along with Delton See or Zometadiscontinued July 2020 due to progression Patient could not tolerate Faslodex because of intense bone pain as well as hypotension. 3.Current treatment: Halaven started 10/27/2018, day 1 and 8 every 3 weeks, today cycle 4  Severe shortness of breathwith recurrent pleural effusions:Pleurx catheter was removed, cardiothoracic surgery has been consulted.  CT CAP 12/28/2018: Positive response to therapy.  Left axillary, mediastinal and right hilar lymphadenopathy has decreased.  Infiltrative metastases in the upper left retroperitoneum involving the left adrenal gland decreased.  Moderate loculated left pleural effusion overall decreased.  Widespread sclerotic bone metastases without new mets.  Based upon the favorable CT scan response, we will continue with the current therapy. Return to clinic for chemo and I will see her  with cycle 1 of each treatment.

## 2018-12-29 NOTE — Patient Instructions (Signed)
[facility] Discharge Instructions for Patients Receiving Chemotherapy  Today you received the following chemotherapy agents: Halaven   To help prevent nausea and vomiting after your treatment, we encourage you to take your nausea medication as directed.    If you develop nausea and vomiting that is not controlled by your nausea medication, call the clinic.   BELOW ARE SYMPTOMS THAT SHOULD BE REPORTED IMMEDIATELY:  *FEVER GREATER THAN 100.5 F  *CHILLS WITH OR WITHOUT FEVER  NAUSEA AND VOMITING THAT IS NOT CONTROLLED WITH YOUR NAUSEA MEDICATION  *UNUSUAL SHORTNESS OF BREATH  *UNUSUAL BRUISING OR BLEEDING  TENDERNESS IN MOUTH AND THROAT WITH OR WITHOUT PRESENCE OF ULCERS  *URINARY PROBLEMS  *BOWEL PROBLEMS  UNUSUAL RASH Items with * indicate a potential emergency and should be followed up as soon as possible.  Feel free to call the clinic should you have any questions or concerns. The clinic phone number is [phone].  Please show the [facility] at check-in to the Emergency Department and triage nurse.   Blood Transfusion, Adult, Care After This sheet gives you information about how to care for yourself after your procedure. Your health care provider may also give you more specific instructions. If you have problems or questions, contact your health care provider. What can I expect after the procedure? After your procedure, it is common to have:  Bruising and soreness where the IV tube was inserted.  Headache. Follow these instructions at home:   Take over-the-counter and prescription medicines only as told by your health care provider.  Return to your normal activities as told by your health care provider.  Follow instructions from your health care provider about how to take care of your IV insertion site. Make sure you: ? Wash your hands with soap and water before you change your bandage (dressing). If soap and water are not available, use  hand sanitizer. ? Change your dressing as told by your health care provider.  Check your IV insertion site every day for signs of infection. Check for: ? More redness, swelling, or pain. ? More fluid or blood. ? Warmth. ? Pus or a bad smell. Contact a health care provider if:  You have more redness, swelling, or pain around the IV insertion site.  You have more fluid or blood coming from the IV insertion site.  Your IV insertion site feels warm to the touch.  You have pus or a bad smell coming from the IV insertion site.  Your urine turns pink, red, or brown.  You feel weak after doing your normal activities. Get help right away if:  You have signs of a serious allergic or immune system reaction, including: ? Itchiness. ? Hives. ? Trouble breathing. ? Anxiety. ? Chest or lower back pain. ? Fever, flushing, and chills. ? Rapid pulse. ? Rash. ? Diarrhea. ? Vomiting. ? Dark urine. ? Serious headache. ? Dizziness. ? Stiff neck. ? Yellow coloration of the face or the white parts of the eyes (jaundice). This information is not intended to replace advice given to you by your health care provider. Make sure you discuss any questions you have with your health care provider. Document Released: 04/22/2014 Document Revised: 01/27/2017 Document Reviewed: 10/16/2015 Elsevier Patient Education  2020 [employer].

## 2018-12-29 NOTE — Progress Notes (Signed)
Pt's ANC is 1.4 today, ok to treat per Dr. Lindi Adie.

## 2018-12-29 NOTE — Progress Notes (Signed)
CRITICAL VALUE ALERT  Critical Value:  Potassium 3.0  Date & Time Notied:  12/29/2018 1000  Provider Notified: Nicholas Lose, MD  Orders Received/Actions taken: Pt to receive potassium 40 mEq p.o potassium during infusion treatment today.  Pt will also be sent home on 20 mEq potassium p.o daily.  Prescription sent to pt pharmacy and Bethena Roys RN in infusion notified to educated pt.

## 2018-12-30 ENCOUNTER — Telehealth: Payer: Self-pay | Admitting: Hematology and Oncology

## 2018-12-30 ENCOUNTER — Other Ambulatory Visit: Payer: Self-pay | Admitting: Hematology and Oncology

## 2018-12-30 LAB — TYPE AND SCREEN
ABO/RH(D): O POS
Antibody Screen: NEGATIVE
Unit division: 0

## 2018-12-30 LAB — BPAM RBC
Blood Product Expiration Date: 202010152359
ISSUE DATE / TIME: 202009151245
Unit Type and Rh: 5100

## 2018-12-30 NOTE — Telephone Encounter (Signed)
I talk with patient regarding schedule  

## 2019-01-05 ENCOUNTER — Ambulatory Visit: Payer: 59

## 2019-01-05 ENCOUNTER — Other Ambulatory Visit: Payer: 59

## 2019-01-06 NOTE — Progress Notes (Signed)
  Radiation Oncology         [phone] ________________________________  Name: Elizabeth [name] MRN: [id number]  Date: 12/03/2018  DOB: 04/27/1955  End of Treatment Note  Diagnosis:   Bone metastasis     Indication for treatment::  palliative       Radiation treatment dates:   11/16/18 - 12/03/18  Site/dose:    1.  Right femur:  35 Gy in 14 fractions using a 2-field isodose plan. 2.  T5 spine:  35 Gy in 14 fractions using a 3-field isodose plan. 3.  L-spine:  35 Gy in 14 fractions using a 3-field 3Dplan.  Narrative: The patient tolerated radiation treatment relatively well.   No major issues with nausea/ vomiting.  Plan: The patient has completed radiation treatment. The patient will return to radiation oncology clinic for routine followup in one month. I advised the patient to call or return sooner if they have any questions or concerns related to their recovery or treatment. ________________________________  Jodelle Gross, M.D., Ph.D.

## 2019-01-11 ENCOUNTER — Other Ambulatory Visit: Payer: Self-pay | Admitting: Hematology and Oncology

## 2019-01-11 ENCOUNTER — Other Ambulatory Visit: Payer: Self-pay

## 2019-01-11 ENCOUNTER — Other Ambulatory Visit: Payer: Self-pay | Admitting: Thoracic Surgery (Cardiothoracic Vascular Surgery)

## 2019-01-11 DIAGNOSIS — J90 Pleural effusion, not elsewhere classified: Secondary | ICD-10-CM

## 2019-01-11 DIAGNOSIS — R140 Abdominal distension (gaseous): Secondary | ICD-10-CM

## 2019-01-11 MED ORDER — FENTANYL 25 MCG/HR TD PT72: 1.0000 | MEDICATED_PATCH | TRANSDERMAL | 0 refills | Status: DC

## 2019-01-11 MED ORDER — OMEPRAZOLE 20 MG PO CPDR: 20.0000 mg | DELAYED_RELEASE_CAPSULE | Freq: Every day | ORAL | 0 refills | Status: AC

## 2019-01-12 ENCOUNTER — Encounter: Payer: Self-pay | Admitting: Thoracic Surgery (Cardiothoracic Vascular Surgery)

## 2019-01-12 ENCOUNTER — Ambulatory Visit
Admission: RE | Admit: 2019-01-12 | Discharge: 2019-01-12 | Disposition: A | Discharge status: Home / Self Care | Payer: 59 | Source: Ambulatory Visit | Attending: Thoracic Surgery (Cardiothoracic Vascular Surgery) | Admitting: Thoracic Surgery (Cardiothoracic Vascular Surgery)

## 2019-01-12 ENCOUNTER — Other Ambulatory Visit: Payer: Self-pay

## 2019-01-12 ENCOUNTER — Ambulatory Visit (INDEPENDENT_AMBULATORY_CARE_PROVIDER_SITE_OTHER): Payer: 59 | Admitting: Thoracic Surgery (Cardiothoracic Vascular Surgery)

## 2019-01-12 VITALS — BP 107/74 | HR 110 | Temp 97.8°F | Resp 20 | Ht 63.0 in | Wt 98.5 lb

## 2019-01-12 DIAGNOSIS — J90 Pleural effusion, not elsewhere classified: Secondary | ICD-10-CM | POA: Diagnosis not present

## 2019-01-12 DIAGNOSIS — Z853 Personal history of malignant neoplasm of breast: Secondary | ICD-10-CM

## 2019-01-12 NOTE — Progress Notes (Signed)
[address]       [place] [address]             [phone]     HPI: [name] returns to further discuss management of her left pleural effusion.  [name] is a 63 year old woman with stage IV breast cancer with a malignant left pleural effusion.  The effusion was first found in January 2020.  She has had multiple thoracenteses.  In April she had a pleural catheter placed by IR.  She was draining about 500 mL twice a week.  She had to stop at that level due to discomfort.  The catheter stopped working and attempts to lyse it were unsuccessful.  The catheter was removed and July.  She did have a CT of the chest in July which showed some loculation to the effusion.  I saw her in the office on 12/22/2018.  She was having some increased left-sided pain and shortness of breath.  I did a thoracentesis.  I was only able to draw about 300 mL off before she had severe pain and became lightheaded.  Her chest x-ray was not significantly changed.  She says that she still has pain where we did the thoracentesis.  She has RSD and says that anytime she has any procedure done she will have pain at that site.  She says that her breathing was better for a couple of weeks and now has gotten a little bit worse again.  Past Medical History:  Diagnosis Date  . Arthritis   . Breast cancer ([facility]) 2010   Left  . Chronic sinusitis   . Family history of adverse reaction to anesthesia    sister-nausea/vomiting  . History of chemotherapy 01/2009-03/2009  . Hypercholesteremia   . Hypothyroid    "inactive thyroid"  . Metastasis ([facility]) 11/2017   LN  . Metastatic cancer to bone ([facility]) 11/2017  . Migraines   . RSD (reflex sympathetic dystrophy)    Past Surgical History:  Procedure Laterality Date  . BREAST RECONSTRUCTION    . EYE SURGERY Bilateral 7/18, 10/18  . IR IMAGING GUIDED PORT INSERTION  10/28/2018  . IR INSTILL VIA CHEST TUBE AGENT FOR FIBRINOLYSIS INI DAY  10/09/2018  . IR  PATIENT EVAL TECH 0-60 MINS  09/08/2018  . IR PERC PLEURAL DRAIN W/INDWELL CATH W/IMG GUIDE  08/05/2018  . IR RADIOLOGIST EVAL & MGMT  10/12/2018  . IR REMOVAL OF PLURAL CATH W/CUFF  10/26/2018  . KNEE ARTHROSCOPY Bilateral   . KNEE SURGERY Right    Two surgeries  . MASTECTOMY Bilateral 05/22/2009  . MASTECTOMY Bilateral 2011   restrictions on left arm-no labs/bloodpressure  . NASAL SINUS SURGERY    . PORTA CATH INSERTION  12/2008  . PORTA CATH REMOVAL  05/2009  . ROOT CANAL    . SHOULDER SURGERY Right   . TOTAL HIP ARTHROPLASTY Left 05/26/2017   Procedure: TOTAL HIP ARTHROPLASTY ANTERIOR APPROACH;  Surgeon: Frederik Pear, MD;  Location: [facility];  Service: Orthopedics;  Laterality: Left;    Current Outpatient Medications  Medication Sig Dispense Refill  . Biotin 10000 MCG TABS Take 10,000 mcg by mouth daily.     . Cholecalciferol (VITAMIN D PO) Take 5,000 Units by mouth daily.     . DULoxetine (CYMBALTA) 30 MG capsule TAKE 1 CAPSULE (30 MG TOTAL) BY MOUTH 2 (TWO) TIMES DAILY. 180 capsule 2  . fentaNYL (DURAGESIC) 25 MCG/HR Place 1 patch onto the skin every 3 (  three) days. 10 patch 0  . fluconazole (DIFLUCAN) 100 MG tablet Take 1 tablet (100 mg total) by mouth daily. 3 tablet 0  . gabapentin (NEURONTIN) 300 MG capsule TAKE 1 CAPSULE BY MOUTH EVERYDAY AT BEDTIME 90 capsule 2  . levothyroxine (SYNTHROID, LEVOTHROID) 25 MCG tablet Take 25 mcg by mouth daily before breakfast.     . lidocaine-prilocaine (EMLA) cream Apply to affected area once 30 g 3  . loperamide (IMODIUM) 2 MG capsule 1 to 2 PO QID prn diarrhea 30 capsule 22  . LORazepam (ATIVAN) 0.5 MG tablet Take 1 tablet (0.5 mg total) by mouth every 6 (six) hours as needed (Nausea or vomiting). 30 tablet 0  . magic mouthwash w/lidocaine SOLN Take 5 mLs by mouth 3 (three) times daily as needed for mouth pain. 100 mL 0  . Multiple Vitamin (MULTIVITAMIN) tablet Take 1 tablet by mouth daily.    . Omega-3 Fatty Acids (SUPER OMEGA 3 EPA/DHA PO) Take  1 capsule by mouth daily.    Marland Kitchen omeprazole (PRILOSEC) 20 MG capsule Take 1 capsule (20 mg total) by mouth daily. 90 capsule 0  . ondansetron (ZOFRAN) 8 MG tablet Take 1 tablet (8 mg total) by mouth 2 (two) times daily as needed (Nausea or vomiting). 30 tablet 1  . oxyCODONE-acetaminophen (PERCOCET) 10-325 MG tablet Take 1 tablet by mouth every 8 (eight) hours as needed for pain. 90 tablet 0  . potassium chloride SA (K-DUR) 20 MEQ tablet Take 1 tablet (20 mEq total) by mouth daily. 30 tablet 1  . prednisoLONE acetate (PRED FORTE) 1 % ophthalmic suspension Place 1 drop into both eyes daily.    . prochlorperazine (COMPAZINE) 10 MG tablet Take 1 tablet (10 mg total) by mouth every 6 (six) hours as needed (Nausea or vomiting). 30 tablet 1  . prochlorperazine (COMPAZINE) 5 MG tablet Take 1 tablet (5 mg total) by mouth every 6 (six) hours as needed for nausea or vomiting. 45 tablet 2  . simethicone (GAS-X) 80 MG chewable tablet Chew 1 tablet (80 mg total) by mouth every 6 (six) hours as needed for flatulence. 120 tablet 2  . traMADol (ULTRAM) 50 MG tablet Take 1 tablet (50 mg total) by mouth every 6 (six) hours as needed. 60 tablet 0   No current facility-administered medications for this visit.     Physical Exam BP 107/74   Pulse (!) 110   Temp 97.8 F (36.6 C) (Skin)   Resp 20   Ht 5\' 3"  (1.6 m)   Wt 98 lb 8 oz (44.7 kg)   SpO2 94% Comment: RA  BMI 17.33 kg/m  63 year old woman in no acute distress Alert and oriented x3 with no focal deficits Lungs absent breath sounds at left base, otherwise clear Cardiac tachycardic, regular, no murmur Extremities no clubbing cyanosis or edema  Diagnostic Tests: CHEST - 2 VIEW  COMPARISON:  Chest CT December 28, 2018; chest radiograph December 22, 2018  FINDINGS: Port-A-Cath tip is in the superior vena cava. No pneumothorax. There remains a moderate left-sided pleural effusion with compressive atelectasis/consolidation throughout much of the  left lower lobe. Loculated fluid in the left major fissure is stable. There is atelectatic change elsewhere on the left.  Right lung is clear. Heart size and pulmonary vascularity within normal limits. No adenopathy.  Surgical clips are noted in each axillary region. Compression fracture at T11 is stable. Blastic changes are noted in this vertebral body. There is also collapse of the T4 vertebral body. Sclerosis in  the posterior aspect of the T12 vertebral body is present, better seen on CT. Other sclerotic bone lesions better seen on CT.  IMPRESSION: Essentially stable pleural effusion on the left with compressive atelectasis/consolidation left lower lobe. Loculated pleural effusion in the left major fissure appears stable. Right lung clear. Stable cardiac silhouette.  Fractures at T4 and T11 with sclerotic bony metastases in these areas noted. Metastasis noted in the posterior aspect of the T12 vertebral body.  Port-A-Cath tip in superior vena cava near the cavoatrial junction. No pneumothorax.   Electronically Signed   By: Lowella Grip III M.D.   On: 01/12/2019 08:55  I personally reviewed the chest x-ray images and concur with the findings noted above  Impression: Elizabeth [name] is a 63 year old woman with stage IV breast cancer with a malignant left pleural effusion.  This was first diagnosed about 10 months ago.  She had multiple thoracenteses and then a pleural catheter was placed.  She was never able to tolerate draining more than about 500 mL at a time with the catheter.  Ultimately the catheter clogged up and had to be removed.  She is not able to tolerate having much fluid drawn off with thoracentesis or with the pleural catheter so were unsure if the lung is trapped or not.  I suspect it may be as her effusion has been the size or larger going back to at least July.  I discussed 4 options with her.  1, observation and only intervene if symptoms worsen.  2,  repeat thoracentesis.  3, placement of new pleural catheter.  4, left VATS for drainage of the effusion and possible decortication of the lung.  We discussed the relative advantages and disadvantages of each approach.  I do not think that she is going to tolerate having a pleural catheter or repeated thoracenteses very well.  I would not favor those approaches.  She is a relatively high risk surgical candidate and I am very concerned about her pain level is given her difficulties with thoracentesis.  On the other hand, that is the one intervention that is likely to make a significant difference for her.  I described the general nature of left VATS for drainage of the effusion and decortication to Mrs. Nolton and her husband.  They understand the need for general anesthesia, the incisions to be used, the use of drainage tubes postoperatively, the expected hospital stay, and the overall recovery.  I informed of the indications, risk, benefits, and alternatives.  They understand the risks include, but not limited to death, MI, DVT, PE, bleeding, possible need for transfusion, infection, prolonged air leak, recurrence of effusion, pain issues, as well as the possibility of other unforeseeable complications.  She wishes to think over her options.  She will let us know if she would like to proceed with surgery.   Plan: She will let us know if she would like to proceed with left VATS for drainage of pleural effusion and possible decortication.  Melrose Nakayama, MD [facility] [phone]

## 2019-01-13 ENCOUNTER — Telehealth: Payer: Self-pay | Admitting: Hematology and Oncology

## 2019-01-13 NOTE — Telephone Encounter (Signed)
Elizabeth [name] called me to ask my opinion regarding VATS thoracotomy and decortication surgery. I left a message for her to call me back.  I expressed to her on the phone answering machine that we can certainly watch it for little while longer and make a decision on that.  The most recent scans show some improvement in the pleural effusion.  Patient is very worried about the longer recovery.  Of 3 to 5 months.

## 2019-01-17 NOTE — Progress Notes (Signed)
Patient Care Team: [name] as PCP - General (Nurse Practitioner)  DIAGNOSIS:    ICD-10-CM   1. Malignant neoplasm of lower-outer quadrant of left breast of female, estrogen receptor positive ([place])  C50.512    Z17.0   2. Pain  R52 gabapentin (NEURONTIN) 300 MG capsule    SUMMARY OF ONCOLOGIC HISTORY: Oncology History  Breast cancer of lower-outer quadrant of left female breast ([place])  12/13/2008 Initial Diagnosis   Left breast biopsy: Invasive ductal carcinoma ER 90% percent, PR 41%, Ki-67 11%, HER-2 negative ratio 1, Oncotype DX score 23, ROR15%   01/27/2009 - 03/10/2009 Neo-Adjuvant Chemotherapy   Neoadjuvant FEC 4; participant in the "bed sheets" study.   05/12/2009 -  Anti-estrogen oral therapy   Femara 2.5 mg daily   05/22/2009 Surgery   Bilateral mastectomy stage IIB T2 N0 M0 IDC left breast grade 1, 2.5 cm, ER 99%, PR 41%, Ki-67 11%, HER-2 negative   12/10/2009 Surgery   Breast reconstruction surgery   11/14/2017 Relapse/Recurrence   Mid back pain: MRI revealed T11 severe pathologic fracture, abnormal signal T10, T11 and T12, moderate canal stenosis at T11   12/03/2017 PET scan   Hypermetabolic metastatic breast cancer involving thoracic and upper abdominal lymph nodes, spine and ribs. There may be hypermetabolic adenopathy in the left neck; Hypermetabolic lymph node versus intramuscular metastasis involving the medial left pectoralis musculature Pathologic T11 fracture     12/11/2017 Procedure   Biopsy of T11 vertebral body lytic lesion: Metastatic breast adenocarcinoma ER positive, PR negative   12/19/2017 - 12/26/2017 Radiation Therapy   Palliative XRT to T 11   01/12/2018 - 10/26/2018 Anti-estrogen oral therapy   Patient could not tolerate Faslodex because of severe hypotension and severe pain in the legs after injections.  Ibrance with letrozole starting 01/12/2018   10/27/2018 -  Chemotherapy   Palliative chemotherapy with Halaven   Malignant neoplasm of  lower-outer quadrant of left breast of female, estrogen receptor positive ([place])  10/21/2018 Initial Diagnosis   Malignant neoplasm of lower-outer quadrant of left breast of female, estrogen receptor positive ([place])   10/27/2018 -  Chemotherapy   The patient had eriBULin mesylate (HALAVEN) 1.65 mg in sodium chloride 0.9 % 100 mL chemo infusion, 1.1 mg/m2 = 2.1 mg, Intravenous,  Once, 5 of 6 cycles Dose modification: 1.1 mg/m2 (original dose 1.4 mg/m2, Cycle 1, Reason: Other (see comments), Comment: CrCL < 50) Administration: 1.65 mg (10/27/2018), 1.65 mg (11/03/2018), 1.65 mg (11/17/2018), 1.65 mg (11/24/2018), 1.65 mg (12/08/2018), 1.65 mg (12/15/2018), 1.65 mg (12/29/2018)  for chemotherapy treatment.    11/17/2018 - 12/03/2018 Radiation Therapy   Palliative radiation to the T5 location of the spine and right femur, 35 Gy in 14 fractions     CHIEF COMPLIANT: Follow-up of metastatic breast cancer on Halaven, cycle5day 1  INTERVAL HISTORY: Elizabeth [name] is a 63 y.o. with above-mentioned history of metastatic breast cancerwhohas had recurrent pleural effusions.Sheis currently on treatment with Halaven. She presents to the clinic today for cycle 5.   REVIEW OF SYSTEMS:   Constitutional: Denies fevers, chills or abnormal weight loss Eyes: Denies blurriness of vision Ears, nose, mouth, throat, and face: Denies mucositis or sore throat Respiratory: Denies cough, dyspnea or wheezes Cardiovascular: Denies palpitation, chest discomfort Gastrointestinal: Denies nausea, heartburn or change in bowel habits Skin: Denies abnormal skin rashes Lymphatics: Denies new lymphadenopathy or easy bruising Neurological: Denies numbness, tingling or new weaknesses Behavioral/Psych: Mood is stable, no new changes  Extremities: No lower extremity edema  Breast: denies any pain or lumps or nodules in either breasts All other systems were reviewed with the patient and are negative.  I have reviewed the past medical  history, past surgical history, social history and family history with the patient and they are unchanged from previous note.  ALLERGIES:  is allergic to shellfish allergy and sulfa antibiotics.  MEDICATIONS:  Current Outpatient Medications  Medication Sig Dispense Refill  . Biotin 10000 MCG TABS Take 10,000 mcg by mouth daily.     . Cholecalciferol (VITAMIN D PO) Take 5,000 Units by mouth daily.     . DULoxetine (CYMBALTA) 30 MG capsule TAKE 1 CAPSULE (30 MG TOTAL) BY MOUTH 2 (TWO) TIMES DAILY. 180 capsule 2  . fentaNYL (DURAGESIC) 25 MCG/HR Place 1 patch onto the skin every 3 (three) days. 10 patch 0  . fluconazole (DIFLUCAN) 100 MG tablet Take 1 tablet (100 mg total) by mouth daily. 3 tablet 0  . gabapentin (NEURONTIN) 300 MG capsule Take 1 capsule (300 mg total) by mouth 2 (two) times daily. 180 capsule 2  . levothyroxine (SYNTHROID, LEVOTHROID) 25 MCG tablet Take 25 mcg by mouth daily before breakfast.     . lidocaine-prilocaine (EMLA) cream Apply to affected area once 30 g 3  . loperamide (IMODIUM) 2 MG capsule 1 to 2 PO QID prn diarrhea 30 capsule 22  . LORazepam (ATIVAN) 0.5 MG tablet Take 1 tablet (0.5 mg total) by mouth every 6 (six) hours as needed (Nausea or vomiting). 30 tablet 0  . magic mouthwash w/lidocaine SOLN Take 5 mLs by mouth 3 (three) times daily as needed for mouth pain. 100 mL 0  . Multiple Vitamin (MULTIVITAMIN) tablet Take 1 tablet by mouth daily.    . Omega-3 Fatty Acids (SUPER OMEGA 3 EPA/DHA PO) Take 1 capsule by mouth daily.    Marland Kitchen omeprazole (PRILOSEC) 20 MG capsule Take 1 capsule (20 mg total) by mouth daily. 90 capsule 0  . ondansetron (ZOFRAN) 8 MG tablet Take 1 tablet (8 mg total) by mouth 2 (two) times daily as needed (Nausea or vomiting). 30 tablet 1  . oxyCODONE-acetaminophen (PERCOCET) 10-325 MG tablet Take 1 tablet by mouth every 8 (eight) hours as needed for pain. 90 tablet 0  . potassium chloride SA (K-DUR) 20 MEQ tablet Take 1 tablet (20 mEq total) by  mouth daily. 30 tablet 1  . prednisoLONE acetate (PRED FORTE) 1 % ophthalmic suspension Place 1 drop into both eyes daily.    . prochlorperazine (COMPAZINE) 10 MG tablet Take 1 tablet (10 mg total) by mouth every 6 (six) hours as needed (Nausea or vomiting). 30 tablet 1  . prochlorperazine (COMPAZINE) 5 MG tablet Take 1 tablet (5 mg total) by mouth every 6 (six) hours as needed for nausea or vomiting. 45 tablet 2  . simethicone (GAS-X) 80 MG chewable tablet Chew 1 tablet (80 mg total) by mouth every 6 (six) hours as needed for flatulence. 120 tablet 2  . traMADol (ULTRAM) 50 MG tablet Take 1 tablet (50 mg total) by mouth every 6 (six) hours as needed. 60 tablet 0   No current facility-administered medications for this visit.     PHYSICAL EXAMINATION: ECOG PERFORMANCE STATUS: 2 - Symptomatic, <50% confined to bed  Vitals:   01/18/19 1350  BP: 109/81  Pulse: (!) 121  Resp: 18  Temp: 98.3 F (36.8 C)  SpO2: 99%   Filed Weights   01/18/19 1350  Weight: 96 lb 4.8 oz (43.7 kg)    GENERAL:  alert, no distress and comfortable SKIN: skin color, texture, turgor are normal, no rashes or significant lesions EYES: normal, Conjunctiva are pink and non-injected, sclera clear OROPHARYNX: no exudate, no erythema and lips, buccal mucosa, and tongue normal  NECK: supple, thyroid normal size, non-tender, without nodularity LYMPH: no palpable lymphadenopathy in the cervical, axillary or inguinal LUNGS: clear to auscultation and percussion with normal breathing effort HEART: regular rate & rhythm and no murmurs and no lower extremity edema ABDOMEN: abdomen soft, non-tender and normal bowel sounds MUSCULOSKELETAL: no cyanosis of digits and no clubbing  NEURO: alert & oriented x 3 with fluent speech, no focal motor/sensory deficits EXTREMITIES: No lower extremity edema  LABORATORY DATA:  I have reviewed the data as listed CMP Latest Ref Rng & Units 01/18/2019 12/29/2018 12/15/2018  Glucose 70 - 99  mg/dL 111(H) 95 133(H)  BUN 8 - 23 mg/dL 13 6(L) 7(L)  Creatinine 0.44 - 1.00 mg/dL 0.82 0.70 0.72  Sodium 135 - 145 mmol/L 138 140 139  Potassium 3.5 - 5.1 mmol/L 4.2 3.0(LL) 3.1(L)  Chloride 98 - 111 mmol/L 103 102 102  CO2 22 - 32 mmol/L '26 29 25  ' Calcium 8.9 - 10.3 mg/dL 8.9 8.4(L) 8.8(L)  Total Protein 6.5 - 8.1 g/dL 6.8 6.1(L) 6.6  Total Bilirubin 0.3 - 1.2 mg/dL 0.3 0.4 0.5  Alkaline Phos 38 - 126 U/L 129(H) 130(H) 166(H)  AST 15 - 41 U/L '19 20 25  ' ALT 0 - 44 U/L '10 9 17    ' Lab Results  Component Value Date   WBC 4.6 01/18/2019   HGB 10.4 (L) 01/18/2019   HCT 33.1 (L) 01/18/2019   MCV 89.2 01/18/2019   PLT 421 (H) 01/18/2019   NEUTROABS 3.2 01/18/2019    ASSESSMENT & PLAN:  Breast cancer of lower-outer quadrant of left female breast Left breast cancer invasive ductal carcinoma 2.5 cm in size grade 1, ER 99%, PR 41%, Ki-67 11%, HER-2 negative T2 N0 M0 stage II a status post neoadjuvant chemotherapy followed by surgery February 2011and wason antiestrogen therapy with Femara from January 2011-July 2017  New onset mid back pain: MRI revealedT11 severe pathologic fracture, abnormal signal T10, T11 and T12, moderate canal stenosis at T11  12/02/2017: PET CT scan hypermetabolic lymphadenopathy in the thoracic and upper abdominal lymph nodes, spine and rib metastases, intramuscular metastases the left pectoralis muscle, T11 vertebral fracture pathologic  Biopsy of the T11 vertebral body ER 90%, PR 0%, Ki-67 10%, HER-2 -1+, foundation 1, PDL 1 Pathologic fracture T11: She met with orthopedics regarding kyphoplasty and they determined that there is no surgical options available for her. ------------------------------------------------------------------- Treatment : 1. Palliative radiation to T11 vertebral body and the pectoralis muscle: 12/20/17-12/26/17 2. Ibrance with letrozole along with Delton See or Zometadiscontinued July 2020 due to progression Patient could not  tolerate Faslodex because of intense bone pain as well as hypotension. ----------------------------------------------------------------------------------------------------------------------  Current treatment: Halaven started 10/27/2018, day 1 and 8 every 3 weeks, today cycle4, being changed to once every 3 weeks  Severe shortness of breathwith recurrent pleural effusions:Pleurx catheter was removed, cardiothoracic surgery has seen her and discussed the pros and cons of doing VATS thoracotomy and decortication.  After much discussion we decided not to pursue the procedure.  I am concerned that she may lose quality of life in this surgical process.  CT CAP 12/28/2018: Positive response to therapy.  Left axillary, mediastinal and right hilar lymphadenopathy has decreased.  Infiltrative metastases in the upper left retroperitoneum involving the left adrenal  gland decreased.  Moderate loculated left pleural effusion overall decreased.  Widespread sclerotic bone metastases without new mets.  Chemotherapy-induced anemia: Transfused previously.  Today's hemoglobin is Continue every 3week Halaven    No orders of the defined types were placed in this encounter.  The patient has a good understanding of the overall plan. she agrees with it. she will call with any problems that may develop before the next visit here.  Nicholas Lose, MD 01/18/2019  Julious Oka Dorshimer am acting as scribe for Dr. Nicholas Lose.  I have reviewed the above documentation for accuracy and completeness, and I agree with the above.

## 2019-01-18 ENCOUNTER — Inpatient Hospital Stay: Payer: 59

## 2019-01-18 ENCOUNTER — Inpatient Hospital Stay (HOSPITAL_BASED_OUTPATIENT_CLINIC_OR_DEPARTMENT_OTHER): Payer: 59 | Admitting: Hematology and Oncology

## 2019-01-18 ENCOUNTER — Inpatient Hospital Stay: Payer: 59 | Attending: Hematology and Oncology

## 2019-01-18 ENCOUNTER — Other Ambulatory Visit: Payer: Self-pay

## 2019-01-18 VITALS — HR 112

## 2019-01-18 DIAGNOSIS — C786 Secondary malignant neoplasm of retroperitoneum and peritoneum: Secondary | ICD-10-CM | POA: Insufficient documentation

## 2019-01-18 DIAGNOSIS — Z79899 Other long term (current) drug therapy: Secondary | ICD-10-CM | POA: Insufficient documentation

## 2019-01-18 DIAGNOSIS — C50512 Malignant neoplasm of lower-outer quadrant of left female breast: Secondary | ICD-10-CM

## 2019-01-18 DIAGNOSIS — Z170 Estrogen receptor positive status [ER+]: Secondary | ICD-10-CM

## 2019-01-18 DIAGNOSIS — D6481 Anemia due to antineoplastic chemotherapy: Secondary | ICD-10-CM | POA: Diagnosis not present

## 2019-01-18 DIAGNOSIS — Z7189 Other specified counseling: Secondary | ICD-10-CM

## 2019-01-18 DIAGNOSIS — C778 Secondary and unspecified malignant neoplasm of lymph nodes of multiple regions: Secondary | ICD-10-CM | POA: Diagnosis not present

## 2019-01-18 DIAGNOSIS — Z5111 Encounter for antineoplastic chemotherapy: Secondary | ICD-10-CM | POA: Diagnosis not present

## 2019-01-18 DIAGNOSIS — C7951 Secondary malignant neoplasm of bone: Secondary | ICD-10-CM | POA: Insufficient documentation

## 2019-01-18 DIAGNOSIS — Z923 Personal history of irradiation: Secondary | ICD-10-CM | POA: Insufficient documentation

## 2019-01-18 DIAGNOSIS — J90 Pleural effusion, not elsewhere classified: Secondary | ICD-10-CM | POA: Insufficient documentation

## 2019-01-18 DIAGNOSIS — R52 Pain, unspecified: Secondary | ICD-10-CM | POA: Diagnosis not present

## 2019-01-18 LAB — CMP (CANCER CENTER ONLY)
ALT: 10 U/L (ref 0–44)
AST: 19 U/L (ref 15–41)
Albumin: 2.8 g/dL — ABNORMAL LOW (ref 3.5–5.0)
Alkaline Phosphatase: 129 U/L — ABNORMAL HIGH (ref 38–126)
Anion gap: 9 (ref 5–15)
BUN: 13 mg/dL (ref 8–23)
CO2: 26 mmol/L (ref 22–32)
Calcium: 8.9 mg/dL (ref 8.9–10.3)
Chloride: 103 mmol/L (ref 98–111)
Creatinine: 0.82 mg/dL (ref 0.44–1.00)
GFR, Est AFR Am: >60 mL/min (ref >60)
GFR, Estimated: >60 mL/min (ref >60)
Glucose, Bld: 111 mg/dL — ABNORMAL HIGH (ref 70–99)
Potassium: 4.2 mmol/L (ref 3.5–5.1)
Sodium: 138 mmol/L (ref 135–145)
Total Bilirubin: 0.3 mg/dL (ref 0.3–1.2)
Total Protein: 6.8 g/dL (ref 6.5–8.1)

## 2019-01-18 LAB — CBC WITH DIFFERENTIAL (CANCER CENTER ONLY)
Abs Immature Granulocytes: 0.02 10*3/uL (ref 0.00–0.07)
Basophils Absolute: 0 10*3/uL (ref 0.0–0.1)
Basophils Relative: 1 %
Eosinophils Absolute: 0 10*3/uL (ref 0.0–0.5)
Eosinophils Relative: 0 %
HCT: 33.1 % — ABNORMAL LOW (ref 36.0–46.0)
Hemoglobin: 10.4 g/dL — ABNORMAL LOW (ref 12.0–15.0)
Immature Granulocytes: 0 %
Lymphocytes Relative: 14 %
Lymphs Abs: 0.7 10*3/uL (ref 0.7–4.0)
MCH: 28 pg (ref 26.0–34.0)
MCHC: 31.4 g/dL (ref 30.0–36.0)
MCV: 89.2 fL (ref 80.0–100.0)
Monocytes Absolute: 0.7 10*3/uL (ref 0.1–1.0)
Monocytes Relative: 14 %
Neutro Abs: 3.2 10*3/uL (ref 1.7–7.7)
Neutrophils Relative %: 71 %
Platelet Count: 421 10*3/uL — ABNORMAL HIGH (ref 150–400)
RBC: 3.71 MIL/uL — ABNORMAL LOW (ref 3.87–5.11)
RDW: 19.3 % — ABNORMAL HIGH (ref 11.5–15.5)
WBC Count: 4.6 10*3/uL (ref 4.0–10.5)
nRBC: 0 % (ref 0.0–0.2)

## 2019-01-18 MED ORDER — SODIUM CHLORIDE 0.9 % IV SOLN
1.1000 mg/m2 | Freq: Once | INTRAVENOUS | Status: AC
  Administered 2019-01-18: 1.65 mg via INTRAVENOUS
  Filled 2019-01-18: qty 3.3

## 2019-01-18 MED ORDER — SODIUM CHLORIDE 0.9% FLUSH
10.0000 mL | INTRAVENOUS | Status: DC | PRN
  Administered 2019-01-18: 10 mL
  Filled 2019-01-18: qty 10

## 2019-01-18 MED ORDER — PROCHLORPERAZINE MALEATE 10 MG PO TABS
10.0000 mg | ORAL_TABLET | Freq: Once | ORAL | Status: AC
  Administered 2019-01-18: 15:00:00 10 mg via ORAL

## 2019-01-18 MED ORDER — GABAPENTIN 300 MG PO CAPS: 300.0000 mg | ORAL_CAPSULE | Freq: Two times a day (BID) | ORAL | 2 refills | Status: DC

## 2019-01-18 MED ORDER — ZOLEDRONIC ACID 4 MG/5ML IV CONC
3.0000 mg | Freq: Once | INTRAVENOUS | Status: AC
  Administered 2019-01-18: 3 mg via INTRAVENOUS
  Filled 2019-01-18: qty 3.75

## 2019-01-18 MED ORDER — TRAMADOL HCL 50 MG PO TABS: 50.0000 mg | ORAL_TABLET | Freq: Four times a day (QID) | ORAL | 0 refills | Status: DC | PRN

## 2019-01-18 MED ORDER — SODIUM CHLORIDE 0.9 % IV SOLN
Freq: Once | INTRAVENOUS | Status: AC
  Filled 2019-01-18: qty 250

## 2019-01-18 MED ORDER — PROCHLORPERAZINE MALEATE 10 MG PO TABS
ORAL_TABLET | ORAL | Status: AC
  Filled 2019-01-18: qty 1

## 2019-01-18 MED ORDER — HEPARIN SOD (PORK) LOCK FLUSH 100 UNIT/ML IV SOLN
500.0000 [IU] | Freq: Once | INTRAVENOUS | Status: AC | PRN
  Administered 2019-01-18: 500 [IU]
  Filled 2019-01-18: qty 5

## 2019-01-18 MED ORDER — OXYCODONE-ACETAMINOPHEN 10-325 MG PO TABS: 1.0000 | ORAL_TABLET | Freq: Three times a day (TID) | ORAL | 0 refills | Status: DC | PRN

## 2019-01-18 MED ORDER — SODIUM CHLORIDE 0.9 % IV SOLN
Freq: Once | INTRAVENOUS | Status: AC
  Administered 2019-01-18: 15:00:00 via INTRAVENOUS
  Filled 2019-01-18: qty 250

## 2019-01-18 NOTE — Assessment & Plan Note (Signed)
Left breast cancer invasive ductal carcinoma 2.5 cm in size grade 1, ER 99%, PR 41%, Ki-67 11%, HER-2 negative T2 N0 M0 stage II a status post neoadjuvant chemotherapy followed by surgery February 2011and wason antiestrogen therapy with Femara from January 2011-July 2017  New onset mid back pain: MRI revealedT11 severe pathologic fracture, abnormal signal T10, T11 and T12, moderate canal stenosis at T11  12/02/2017: PET CT scan hypermetabolic lymphadenopathy in the thoracic and upper abdominal lymph nodes, spine and rib metastases, intramuscular metastases the left pectoralis muscle, T11 vertebral fracture pathologic  Biopsy of the T11 vertebral body ER 90%, PR 0%, Ki-67 10%, HER-2 -1+, foundation 1, PDL 1 Pathologic fracture T11: She met with orthopedics regarding kyphoplasty and they determined that there is no surgical options available for her. ------------------------------------------------------------------- Treatment : 1. Palliative radiation to T11 vertebral body and the pectoralis muscle: 12/20/17-12/26/17 2. Ibrance with letrozole along with Xgeva or Zometadiscontinued July 2020 due to progression Patient could not tolerate Faslodex because of intense bone pain as well as hypotension. ----------------------------------------------------------------------------------------------------------------------  Current treatment: Halaven started 10/27/2018, day 1 and 8 every 3 weeks, today cycle4, being changed to once every 3 weeks  Severe shortness of breathwith recurrent pleural effusions:Pleurx catheter was removed, cardiothoracic surgery has seen her and discussed the pros and cons of doing VATS thoracotomy and decortication.  Patient is not keen on pursuing this procedure because of the long recovery time.  She is also concerned about whether she has that much longevity to justify the surgery.  CT CAP 12/28/2018: Positive response to therapy.  Left axillary, mediastinal and  right hilar lymphadenopathy has decreased.  Infiltrative metastases in the upper left retroperitoneum involving the left adrenal gland decreased.  Moderate loculated left pleural effusion overall decreased.  Widespread sclerotic bone metastases without new mets.  Chemotherapy-induced anemia: Transfused previously.  Today's hemoglobin is Continue every 3week Halaven 

## 2019-01-18 NOTE — Patient Instructions (Signed)
[facility] Discharge Instructions for Patients Receiving Chemotherapy  Today you received the following chemotherapy agents: Eribulin mesylate (Halaven)  To help prevent nausea and vomiting after your treatment, we encourage you to take your nausea medication as directed.   If you develop nausea and vomiting that is not controlled by your nausea medication, call the clinic.   BELOW ARE SYMPTOMS THAT SHOULD BE REPORTED IMMEDIATELY:  *FEVER GREATER THAN 100.5 F  *CHILLS WITH OR WITHOUT FEVER  NAUSEA AND VOMITING THAT IS NOT CONTROLLED WITH YOUR NAUSEA MEDICATION  *UNUSUAL SHORTNESS OF BREATH  *UNUSUAL BRUISING OR BLEEDING  TENDERNESS IN MOUTH AND THROAT WITH OR WITHOUT PRESENCE OF ULCERS  *URINARY PROBLEMS  *BOWEL PROBLEMS  UNUSUAL RASH Items with * indicate a potential emergency and should be followed up as soon as possible.  Feel free to call the clinic should you have any questions or concerns. The clinic phone number is [phone].  Please show the [facility] at check-in to the Emergency Department and triage nurse.  Zoledronic Acid injection (Hypercalcemia, Oncology) What is this medicine? ZOLEDRONIC ACID (ZOE le dron ik AS id) lowers the amount of calcium loss from bone. It is used to treat too much calcium in your blood from cancer. It is also used to prevent complications of cancer that has spread to the bone. This medicine may be used for other purposes; ask your health care provider or pharmacist if you have questions. COMMON BRAND NAME(S): Zometa What should I tell my health care provider before I take this medicine? They need to know if you have any of these conditions:  aspirin-sensitive asthma  cancer, especially if you are receiving medicines used to treat cancer  dental disease or wear dentures  infection  kidney disease  receiving corticosteroids like dexamethasone or prednisone  an unusual or allergic reaction to  zoledronic acid, other medicines, foods, dyes, or preservatives  pregnant or trying to get pregnant  breast-feeding How should I use this medicine? This medicine is for infusion into a vein. It is given by a health care professional in a hospital or clinic setting. Talk to your pediatrician regarding the use of this medicine in children. Special care may be needed. Overdosage: If you think you have taken too much of this medicine contact a poison control center or emergency room at once. NOTE: This medicine is only for you. Do not share this medicine with others. What if I miss a dose? It is important not to miss your dose. Call your doctor or health care professional if you are unable to keep an appointment. What may interact with this medicine?  certain antibiotics given by injection  NSAIDs, medicines for pain and inflammation, like ibuprofen or naproxen  some diuretics like bumetanide, furosemide  teriparatide  thalidomide This list may not describe all possible interactions. Give your health care provider a list of all the medicines, herbs, non-prescription drugs, or dietary supplements you use. Also tell them if you smoke, drink alcohol, or use illegal drugs. Some items may interact with your medicine. What should I watch for while using this medicine? Visit your doctor or health care professional for regular checkups. It may be some time before you see the benefit from this medicine. Do not stop taking your medicine unless your doctor tells you to. Your doctor may order blood tests or other tests to see how you are doing. Women should inform their doctor if they wish to become pregnant or think they might  be pregnant. There is a potential for serious side effects to an unborn child. Talk to your health care professional or pharmacist for more information. You should make sure that you get enough calcium and vitamin D while you are taking this medicine. Discuss the foods you eat and  the vitamins you take with your health care professional. Some people who take this medicine have severe bone, joint, and/or muscle pain. This medicine may also increase your risk for jaw problems or a broken thigh bone. Tell your doctor right away if you have severe pain in your jaw, bones, joints, or muscles. Tell your doctor if you have any pain that does not go away or that gets worse. Tell your dentist and dental surgeon that you are taking this medicine. You should not have major dental surgery while on this medicine. See your dentist to have a dental exam and fix any dental problems before starting this medicine. Take good care of your teeth while on this medicine. Make sure you see your dentist for regular follow-up appointments. What side effects may I notice from receiving this medicine? Side effects that you should report to your doctor or health care professional as soon as possible:  allergic reactions like skin rash, itching or hives, swelling of the face, lips, or tongue  anxiety, confusion, or depression  breathing problems  changes in vision  eye pain  feeling faint or lightheaded, falls  jaw pain, especially after dental work  mouth sores  muscle cramps, stiffness, or weakness  redness, blistering, peeling or loosening of the skin, including inside the mouth  trouble passing urine or change in the amount of urine Side effects that usually do not require medical attention (report to your doctor or health care professional if they continue or are bothersome):  bone, joint, or muscle pain  constipation  diarrhea  fever  hair loss  irritation at site where injected  loss of appetite  nausea, vomiting  stomach upset  trouble sleeping  trouble swallowing  weak or tired This list may not describe all possible side effects. Call your doctor for medical advice about side effects. You may report side effects to FDA at 1-800-FDA-1088. Where should I keep my  medicine? This drug is given in a hospital or clinic and will not be stored at home. NOTE: This sheet is a summary. It may not cover all possible information. If you have questions about this medicine, talk to your doctor, pharmacist, or health care provider.  2020 Elsevier/Gold Standard (2013-08-28 14:19:39)  Coronavirus (COVID-19) Are you at risk?  Are you at risk for the Coronavirus (COVID-19)?  To be considered HIGH RISK for Coronavirus (COVID-19), you have to meet the following criteria:  . Traveled to [place], [place], [place], [place] or [place]; or in the [place] to [name], [place], [place], or [place]; and have fever, cough, and shortness of breath within the last 2 weeks of travel OR . Been in close contact with a person diagnosed with COVID-19 within the last 2 weeks and have fever, cough, and shortness of breath . IF YOU DO NOT MEET THESE CRITERIA, YOU ARE CONSIDERED LOW RISK FOR COVID-19.  What to do if you are HIGH RISK for COVID-19?  Marland Kitchen If you are having a medical emergency, call 911. . Seek medical care right away. Before you go to a doctor's office, urgent care or emergency department, call ahead and tell them about your recent travel, contact with someone diagnosed with  COVID-19, and your symptoms. You should receive instructions from your physician's office regarding next steps of care.  . When you arrive at healthcare provider, tell the healthcare staff immediately you have returned from visiting [place], [place], [place], [place] or [place]; or traveled in the [place] to [place], [place], [place], or [place]; in the last two weeks or you have been in close contact with a person diagnosed with COVID-19 in the last 2 weeks.   . Tell the health care staff about your symptoms: fever, cough and shortness of breath. . After you have been seen by a medical provider, you will be either: o Tested for (COVID-19) and discharged home on quarantine except to  seek medical care if symptoms worsen, and asked to  - Stay home and avoid contact with others until you get your results (4-5 days)  - Avoid travel on public transportation if possible (such as bus, train, or airplane) or o Sent to the Emergency Department by EMS for evaluation, COVID-19 testing, and possible admission depending on your condition and test results.  What to do if you are LOW RISK for COVID-19?  Reduce your risk of any infection by using the same precautions used for avoiding the common cold or flu:  Marland Kitchen Wash your hands often with soap and warm water for at least 20 seconds.  If soap and water are not readily available, use an alcohol-based hand sanitizer with at least 60% alcohol.  . If coughing or sneezing, cover your mouth and nose by coughing or sneezing into the elbow areas of your shirt or coat, into a tissue or into your sleeve (not your hands). . Avoid shaking hands with others and consider head nods or verbal greetings only. . Avoid touching your eyes, nose, or mouth with unwashed hands.  . Avoid close contact with people who are sick. . Avoid places or events with large numbers of people in one location, like concerts or sporting events. . Carefully consider travel plans you have or are making. . If you are planning any travel outside or inside the [place], visit the CDC's Travelers' Health webpage for the latest health notices. . If you have some symptoms but not all symptoms, continue to monitor at home and seek medical attention if your symptoms worsen. . If you are having a medical emergency, call 911.   [place] / e-Visit: [email or web address]         [facility]: [facility]: [id number]                   [facility]: [phone]

## 2019-01-18 NOTE — Progress Notes (Signed)
Per Dr. Lindi Adie: OK to treat with elevated HR and for patient to receive Zometa today a few days early

## 2019-01-19 ENCOUNTER — Ambulatory Visit: Payer: 59 | Admitting: Hematology and Oncology

## 2019-01-19 ENCOUNTER — Other Ambulatory Visit: Payer: 59

## 2019-01-19 ENCOUNTER — Ambulatory Visit: Payer: 59

## 2019-01-20 ENCOUNTER — Other Ambulatory Visit: Payer: Self-pay | Admitting: Hematology and Oncology

## 2019-01-26 ENCOUNTER — Other Ambulatory Visit: Payer: 59

## 2019-01-26 ENCOUNTER — Ambulatory Visit: Payer: 59

## 2019-02-07 NOTE — Progress Notes (Signed)
Patient Care Team: Redmon, Barth Kirks, PA-C as PCP - General (Nurse Practitioner)  DIAGNOSIS:    ICD-10-CM   1. Malignant neoplasm of lower-outer quadrant of left breast of female, estrogen receptor positive ([place])  C50.512    Z17.0     SUMMARY OF ONCOLOGIC HISTORY: Oncology History  Breast cancer of lower-outer quadrant of left female breast ([place])  12/13/2008 Initial Diagnosis   Left breast biopsy: Invasive ductal carcinoma ER 90% percent, PR 41%, Ki-67 11%, HER-2 negative ratio 1, Oncotype DX score 23, ROR15%   01/27/2009 - 03/10/2009 Neo-Adjuvant Chemotherapy   Neoadjuvant FEC 4; participant in the "bed sheets" study.   05/12/2009 -  Anti-estrogen oral therapy   Femara 2.5 mg daily   05/22/2009 Surgery   Bilateral mastectomy stage IIB T2 N0 M0 IDC left breast grade 1, 2.5 cm, ER 99%, PR 41%, Ki-67 11%, HER-2 negative   12/10/2009 Surgery   Breast reconstruction surgery   11/14/2017 Relapse/Recurrence   Mid back pain: MRI revealed T11 severe pathologic fracture, abnormal signal T10, T11 and T12, moderate canal stenosis at T11   12/03/2017 PET scan   Hypermetabolic metastatic breast cancer involving thoracic and upper abdominal lymph nodes, spine and ribs. There may be hypermetabolic adenopathy in the left neck; Hypermetabolic lymph node versus intramuscular metastasis involving the medial left pectoralis musculature Pathologic T11 fracture     12/11/2017 Procedure   Biopsy of T11 vertebral body lytic lesion: Metastatic breast adenocarcinoma ER positive, PR negative   12/19/2017 - 12/26/2017 Radiation Therapy   Palliative XRT to T 11   01/12/2018 - 10/26/2018 Anti-estrogen oral therapy   Patient could not tolerate Faslodex because of severe hypotension and severe pain in the legs after injections.  Ibrance with letrozole starting 01/12/2018   10/27/2018 -  Chemotherapy   Palliative chemotherapy with Halaven   Malignant neoplasm of lower-outer quadrant of left breast of female,  estrogen receptor positive ([place])  10/21/2018 Initial Diagnosis   Malignant neoplasm of lower-outer quadrant of left breast of female, estrogen receptor positive ([place])   10/27/2018 -  Chemotherapy   The patient had eriBULin mesylate (HALAVEN) 1.65 mg in sodium chloride 0.9 % 100 mL chemo infusion, 1.1 mg/m2 = 2.1 mg, Intravenous,  Once, 5 of 6 cycles Dose modification: 1.1 mg/m2 (original dose 1.4 mg/m2, Cycle 1, Reason: Other (see comments), Comment: CrCL < 50) Administration: 1.65 mg (10/27/2018), 1.65 mg (11/03/2018), 1.65 mg (11/17/2018), 1.65 mg (11/24/2018), 1.65 mg (12/08/2018), 1.65 mg (12/15/2018), 1.65 mg (12/29/2018), 1.65 mg (01/18/2019)  for chemotherapy treatment.    11/17/2018 - 12/03/2018 Radiation Therapy   Palliative radiation to the T5 location of the spine and right femur, 35 Gy in 14 fractions     CHIEF COMPLIANT: Follow-up of metastatic breast cancer on Halaven, cycle6day1  INTERVAL HISTORY: Elizabeth [name] is a 63 y.o. with above-mentioned history of metastatic breast cancerwhohas had recurrent pleural effusions.Sheis currently on treatment with Halaven. She presents to the clinic today for cycle 6.  She reports that she had some discomfort in the left chest for the past several days this has gotten especially worse after she pulled some grass. Overall she does not appear to have any neuropathy or nausea vomiting from chemo. We decided to obtain PET/CT scans in December.  REVIEW OF SYSTEMS:   Constitutional: Significant weight loss but relatively stable Eyes: Denies blurriness of vision Ears, nose, mouth, throat, and face: Denies mucositis or sore throat Respiratory: Complaining of left chest wall pain and discomfort. Cardiovascular: Denies palpitation, chest  discomfort Gastrointestinal: Denies nausea, heartburn or change in bowel habits Skin: Denies abnormal skin rashes Lymphatics: Denies new lymphadenopathy or easy bruising Neurological: Denies numbness, tingling or new  weaknesses Behavioral/Psych: Mood is stable, no new changes  Extremities: No lower extremity edema Breast: denies any pain or lumps or nodules in either breasts All other systems were reviewed with the patient and are negative.  I have reviewed the past medical history, past surgical history, social history and family history with the patient and they are unchanged from previous note.  ALLERGIES:  is allergic to shellfish allergy and sulfa antibiotics.  MEDICATIONS:  Current Outpatient Medications  Medication Sig Dispense Refill  . Biotin 10000 MCG TABS Take 10,000 mcg by mouth daily.     . Cholecalciferol (VITAMIN D PO) Take 5,000 Units by mouth daily.     . DULoxetine (CYMBALTA) 30 MG capsule TAKE 1 CAPSULE (30 MG TOTAL) BY MOUTH 2 (TWO) TIMES DAILY. 180 capsule 2  . fentaNYL (DURAGESIC) 25 MCG/HR Place 1 patch onto the skin every 3 (three) days. 10 patch 0  . fluconazole (DIFLUCAN) 100 MG tablet Take 1 tablet (100 mg total) by mouth daily. 3 tablet 0  . gabapentin (NEURONTIN) 300 MG capsule Take 1 capsule (300 mg total) by mouth 2 (two) times daily. 180 capsule 2  . KLOR-CON M20 20 MEQ tablet TAKE 1 TABLET BY MOUTH EVERY DAY 30 tablet 1  . levothyroxine (SYNTHROID, LEVOTHROID) 25 MCG tablet Take 25 mcg by mouth daily before breakfast.     . lidocaine-prilocaine (EMLA) cream Apply to affected area once 30 g 3  . loperamide (IMODIUM) 2 MG capsule 1 to 2 PO QID prn diarrhea 30 capsule 22  . LORazepam (ATIVAN) 0.5 MG tablet Take 1 tablet (0.5 mg total) by mouth every 6 (six) hours as needed (Nausea or vomiting). 30 tablet 0  . magic mouthwash w/lidocaine SOLN Take 5 mLs by mouth 3 (three) times daily as needed for mouth pain. 100 mL 0  . Multiple Vitamin (MULTIVITAMIN) tablet Take 1 tablet by mouth daily.    . Omega-3 Fatty Acids (SUPER OMEGA 3 EPA/DHA PO) Take 1 capsule by mouth daily.    Marland Kitchen omeprazole (PRILOSEC) 20 MG capsule Take 1 capsule (20 mg total) by mouth daily. 90 capsule 0  .  ondansetron (ZOFRAN) 8 MG tablet Take 1 tablet (8 mg total) by mouth 2 (two) times daily as needed (Nausea or vomiting). 30 tablet 1  . oxyCODONE-acetaminophen (PERCOCET) 10-325 MG tablet Take 1 tablet by mouth every 8 (eight) hours as needed for pain. 90 tablet 0  . prednisoLONE acetate (PRED FORTE) 1 % ophthalmic suspension Place 1 drop into both eyes daily.    . prochlorperazine (COMPAZINE) 10 MG tablet Take 1 tablet (10 mg total) by mouth every 6 (six) hours as needed (Nausea or vomiting). 30 tablet 1  . prochlorperazine (COMPAZINE) 5 MG tablet Take 1 tablet (5 mg total) by mouth every 6 (six) hours as needed for nausea or vomiting. 45 tablet 2  . simethicone (GAS-X) 80 MG chewable tablet Chew 1 tablet (80 mg total) by mouth every 6 (six) hours as needed for flatulence. 120 tablet 2  . traMADol (ULTRAM) 50 MG tablet Take 1 tablet (50 mg total) by mouth every 6 (six) hours as needed. 60 tablet 0   No current facility-administered medications for this visit.    Facility-Administered Medications Ordered in Other Visits  Medication Dose Route Frequency Provider Last Rate Last Dose  . sodium chloride  flush (NS) 0.9 % injection 10 mL  10 mL Intravenous PRN Nicholas Lose, MD   10 mL at 02/08/19 1133    PHYSICAL EXAMINATION: ECOG PERFORMANCE STATUS: 1 - Symptomatic but completely ambulatory  There were no vitals filed for this visit. There were no vitals filed for this visit.  GENERAL: alert, no distress and comfortable SKIN: skin color, texture, turgor are normal, no rashes or significant lesions EYES: normal, Conjunctiva are pink and non-injected, sclera clear OROPHARYNX: no exudate, no erythema and lips, buccal mucosa, and tongue normal  NECK: supple, thyroid normal size, non-tender, without nodularity LYMPH: no palpable lymphadenopathy in the cervical, axillary or inguinal LUNGS: clear to auscultation and percussion with normal breathing effort HEART: regular rate & rhythm and no murmurs  and no lower extremity edema ABDOMEN: abdomen soft, non-tender and normal bowel sounds MUSCULOSKELETAL: no cyanosis of digits and no clubbing  NEURO: alert & oriented x 3 with fluent speech, no focal motor/sensory deficits EXTREMITIES: No lower extremity edema  LABORATORY DATA:  I have reviewed the data as listed CMP Latest Ref Rng & Units 01/18/2019 12/29/2018 12/15/2018  Glucose 70 - 99 mg/dL 111(H) 95 133(H)  BUN 8 - 23 mg/dL 13 6(L) 7(L)  Creatinine 0.44 - 1.00 mg/dL 0.82 0.70 0.72  Sodium 135 - 145 mmol/L 138 140 139  Potassium 3.5 - 5.1 mmol/L 4.2 3.0(LL) 3.1(L)  Chloride 98 - 111 mmol/L 103 102 102  CO2 22 - 32 mmol/L '26 29 25  ' Calcium 8.9 - 10.3 mg/dL 8.9 8.4(L) 8.8(L)  Total Protein 6.5 - 8.1 g/dL 6.8 6.1(L) 6.6  Total Bilirubin 0.3 - 1.2 mg/dL 0.3 0.4 0.5  Alkaline Phos 38 - 126 U/L 129(H) 130(H) 166(H)  AST 15 - 41 U/L '19 20 25  ' ALT 0 - 44 U/L '10 9 17    ' Lab Results  Component Value Date   WBC 4.6 01/18/2019   HGB 10.4 (L) 01/18/2019   HCT 33.1 (L) 01/18/2019   MCV 89.2 01/18/2019   PLT 421 (H) 01/18/2019   NEUTROABS 3.2 01/18/2019    ASSESSMENT & PLAN:  Malignant neoplasm of lower-outer quadrant of left breast of female, estrogen receptor positive ([place]) Left breast cancer invasive ductal carcinoma 2.5 cm in size grade 1, ER 99%, PR 41%, Ki-67 11%, HER-2 negative T2 N0 M0 stage II a status post neoadjuvant chemotherapy followed by surgery February 2011and wason antiestrogen therapy with Femara from January 2011-July 2017  New onset mid back pain: MRI revealedT11 severe pathologic fracture, abnormal signal T10, T11 and T12, moderate canal stenosis at T11  12/02/2017: PET CT scan hypermetabolic lymphadenopathy in the thoracic and upper abdominal lymph nodes, spine and rib metastases, intramuscular metastases the left pectoralis muscle, T11 vertebral fracture pathologic  Biopsy of the T11 vertebral body ER 90%, PR 0%, Ki-67 10%, HER-2 -1+, foundation 1, PDL 1  Pathologic fracture T11: She met with orthopedics regarding kyphoplasty and they determined that there is no surgical options available for her. ------------------------------------------------------------------- Treatment : 1. Palliative radiation to T11 vertebral body and the pectoralis muscle: 12/20/17-12/26/17 2. Ibrance with letrozole along with Delton See or Zometadiscontinued July 2020 due to progression Patient could not tolerate Faslodex because of intense bone pain as well as hypotension. ----------------------------------------------------------------------------------------------------------------------  Current treatment: Halaven started 10/27/2018, day 1and8 every 3 weeks, today cycle4, being changed to once every 3 weeks  Severe shortness of breathwith recurrent pleural effusions:Pleurx catheter was removed, cardiothoracic surgery has seen her and discussed the pros and cons of doing VATS thoracotomy  and decortication.  After much discussion we decided not to pursue the procedure.  I am concerned that she may lose quality of life in this surgical process.  CT CAP 12/28/2018: Positive response to therapy. Left axillary, mediastinal and right hilar lymphadenopathy has decreased. Infiltrative metastases in the upper left retroperitoneum involving the left adrenal gland decreased. Moderate loculated left pleural effusion overall decreased. Widespread sclerotic bone metastases without new mets.  Chemotherapy-induced anemia: Transfused previously.  Today's hemoglobin is 9.7  Next scans will be done in December 2020 Continue every 3 week Halaven   No orders of the defined types were placed in this encounter.  The patient has a good understanding of the overall plan. she agrees with it. she will call with any problems that may develop before the next visit here.  Nicholas Lose, MD 02/08/2019  Elizabeth [name] am acting as scribe for Dr. Nicholas Lose.  I have reviewed the  above documentation for accuracy and completeness, and I agree with the above.

## 2019-02-08 ENCOUNTER — Inpatient Hospital Stay: Payer: 59

## 2019-02-08 ENCOUNTER — Other Ambulatory Visit: Payer: Self-pay

## 2019-02-08 ENCOUNTER — Inpatient Hospital Stay (HOSPITAL_BASED_OUTPATIENT_CLINIC_OR_DEPARTMENT_OTHER): Payer: 59 | Admitting: Hematology and Oncology

## 2019-02-08 VITALS — BP 141/91 | HR 111 | Temp 98.3°F | Resp 17 | Ht 63.0 in | Wt 97.7 lb

## 2019-02-08 VITALS — HR 98

## 2019-02-08 DIAGNOSIS — Z5111 Encounter for antineoplastic chemotherapy: Secondary | ICD-10-CM | POA: Diagnosis not present

## 2019-02-08 DIAGNOSIS — Z170 Estrogen receptor positive status [ER+]: Secondary | ICD-10-CM | POA: Diagnosis not present

## 2019-02-08 DIAGNOSIS — C7951 Secondary malignant neoplasm of bone: Secondary | ICD-10-CM | POA: Diagnosis not present

## 2019-02-08 DIAGNOSIS — Z95828 Presence of other vascular implants and grafts: Secondary | ICD-10-CM

## 2019-02-08 DIAGNOSIS — C50512 Malignant neoplasm of lower-outer quadrant of left female breast: Secondary | ICD-10-CM | POA: Diagnosis not present

## 2019-02-08 DIAGNOSIS — Z7189 Other specified counseling: Secondary | ICD-10-CM

## 2019-02-08 LAB — CBC WITH DIFFERENTIAL (CANCER CENTER ONLY)
Abs Immature Granulocytes: 0.02 10*3/uL (ref 0.00–0.07)
Basophils Absolute: 0 10*3/uL (ref 0.0–0.1)
Basophils Relative: 0 %
Eosinophils Absolute: 0 10*3/uL (ref 0.0–0.5)
Eosinophils Relative: 1 %
HCT: 31.2 % — ABNORMAL LOW (ref 36.0–46.0)
Hemoglobin: 9.7 g/dL — ABNORMAL LOW (ref 12.0–15.0)
Immature Granulocytes: 0 %
Lymphocytes Relative: 14 %
Lymphs Abs: 0.6 10*3/uL — ABNORMAL LOW (ref 0.7–4.0)
MCH: 28.9 pg (ref 26.0–34.0)
MCHC: 31.1 g/dL (ref 30.0–36.0)
MCV: 92.9 fL (ref 80.0–100.0)
Monocytes Absolute: 0.5 10*3/uL (ref 0.1–1.0)
Monocytes Relative: 12 %
Neutro Abs: 3.3 10*3/uL (ref 1.7–7.7)
Neutrophils Relative %: 73 %
Platelet Count: 465 10*3/uL — ABNORMAL HIGH (ref 150–400)
RBC: 3.36 MIL/uL — ABNORMAL LOW (ref 3.87–5.11)
RDW: 18.4 % — ABNORMAL HIGH (ref 11.5–15.5)
WBC Count: 4.5 10*3/uL (ref 4.0–10.5)
nRBC: 0 % (ref 0.0–0.2)

## 2019-02-08 LAB — CMP (CANCER CENTER ONLY)
ALT: 14 U/L (ref 0–44)
AST: 23 U/L (ref 15–41)
Albumin: 2.8 g/dL — ABNORMAL LOW (ref 3.5–5.0)
Alkaline Phosphatase: 139 U/L — ABNORMAL HIGH (ref 38–126)
Anion gap: 10 (ref 5–15)
BUN: 17 mg/dL (ref 8–23)
CO2: 25 mmol/L (ref 22–32)
Calcium: 9 mg/dL (ref 8.9–10.3)
Chloride: 102 mmol/L (ref 98–111)
Creatinine: 0.85 mg/dL (ref 0.44–1.00)
GFR, Est AFR Am: >60 mL/min (ref >60)
GFR, Estimated: >60 mL/min (ref >60)
Glucose, Bld: 107 mg/dL — ABNORMAL HIGH (ref 70–99)
Potassium: 3.9 mmol/L (ref 3.5–5.1)
Sodium: 137 mmol/L (ref 135–145)
Total Bilirubin: 0.3 mg/dL (ref 0.3–1.2)
Total Protein: 6.5 g/dL (ref 6.5–8.1)

## 2019-02-08 MED ORDER — SODIUM CHLORIDE 0.9 % IV SOLN
Freq: Once | INTRAVENOUS | Status: AC
  Administered 2019-02-08: 13:00:00 via INTRAVENOUS
  Filled 2019-02-08: qty 250

## 2019-02-08 MED ORDER — PROCHLORPERAZINE MALEATE 10 MG PO TABS
ORAL_TABLET | ORAL | Status: AC
  Filled 2019-02-08: qty 1

## 2019-02-08 MED ORDER — HEPARIN SOD (PORK) LOCK FLUSH 100 UNIT/ML IV SOLN
500.0000 [IU] | Freq: Once | INTRAVENOUS | Status: AC | PRN
  Administered 2019-02-08: 500 [IU]
  Filled 2019-02-08: qty 5

## 2019-02-08 MED ORDER — SODIUM CHLORIDE 0.9% FLUSH
10.0000 mL | INTRAVENOUS | Status: DC | PRN
  Administered 2019-02-08: 12:00:00 10 mL via INTRAVENOUS
  Filled 2019-02-08: qty 10

## 2019-02-08 MED ORDER — SODIUM CHLORIDE 0.9 % IV SOLN
1.1000 mg/m2 | Freq: Once | INTRAVENOUS | Status: AC
  Administered 2019-02-08: 1.65 mg via INTRAVENOUS
  Filled 2019-02-08: qty 3.3

## 2019-02-08 MED ORDER — SODIUM CHLORIDE 0.9% FLUSH
10.0000 mL | INTRAVENOUS | Status: DC | PRN
  Administered 2019-02-08: 10 mL
  Filled 2019-02-08: qty 10

## 2019-02-08 MED ORDER — PROCHLORPERAZINE MALEATE 10 MG PO TABS
10.0000 mg | ORAL_TABLET | Freq: Once | ORAL | Status: AC
  Administered 2019-02-08: 10 mg via ORAL

## 2019-02-08 NOTE — Assessment & Plan Note (Addendum)
Left breast cancer invasive ductal carcinoma 2.5 cm in size grade 1, ER 99%, PR 41%, Ki-67 11%, HER-2 negative T2 N0 M0 stage II a status post neoadjuvant chemotherapy followed by surgery February 2011and wason antiestrogen therapy with Femara from January 2011-July 2017  New onset mid back pain: MRI revealedT11 severe pathologic fracture, abnormal signal T10, T11 and T12, moderate canal stenosis at T11  12/02/2017: PET CT scan hypermetabolic lymphadenopathy in the thoracic and upper abdominal lymph nodes, spine and rib metastases, intramuscular metastases the left pectoralis muscle, T11 vertebral fracture pathologic  Biopsy of the T11 vertebral body ER 90%, PR 0%, Ki-67 10%, HER-2 -1+, foundation 1, PDL 1 Pathologic fracture T11: She met with orthopedics regarding kyphoplasty and they determined that there is no surgical options available for her. ------------------------------------------------------------------- Treatment : 1. Palliative radiation to T11 vertebral body and the pectoralis muscle: 12/20/17-12/26/17 2. Ibrance with letrozole along with Delton See or Zometadiscontinued July 2020 due to progression Patient could not tolerate Faslodex because of intense bone pain as well as hypotension. ----------------------------------------------------------------------------------------------------------------------  Current treatment: Halaven started 10/27/2018, day 1and8 every 3 weeks, today cycle4, being changed to once every 3 weeks  Severe shortness of breathwith recurrent pleural effusions:Pleurx catheter was removed, cardiothoracic surgery has seen her and discussed the pros and cons of doing VATS thoracotomy and decortication.  After much discussion we decided not to pursue the procedure.  I am concerned that she may lose quality of life in this surgical process.  CT CAP 12/28/2018: Positive response to therapy. Left axillary, mediastinal and right hilar lymphadenopathy has  decreased. Infiltrative metastases in the upper left retroperitoneum involving the left adrenal gland decreased. Moderate loculated left pleural effusion overall decreased. Widespread sclerotic bone metastases without new mets.  Chemotherapy-induced anemia: Transfused previously.  Today's hemoglobin is  Next scans will be done in December 2020 Continue every 3 week Halaven

## 2019-02-08 NOTE — Patient Instructions (Signed)
[facility] Discharge Instructions for Patients Receiving Chemotherapy  Today you received the following chemotherapy agents Halaven  To help prevent nausea and vomiting after your treatment, we encourage you to take your nausea medication as directed If you develop nausea and vomiting that is not controlled by your nausea medication, call the clinic.   BELOW ARE SYMPTOMS THAT SHOULD BE REPORTED IMMEDIATELY:  *FEVER GREATER THAN 100.5 F  *CHILLS WITH OR WITHOUT FEVER  NAUSEA AND VOMITING THAT IS NOT CONTROLLED WITH YOUR NAUSEA MEDICATION  *UNUSUAL SHORTNESS OF BREATH  *UNUSUAL BRUISING OR BLEEDING  TENDERNESS IN MOUTH AND THROAT WITH OR WITHOUT PRESENCE OF ULCERS  *URINARY PROBLEMS  *BOWEL PROBLEMS  UNUSUAL RASH Items with * indicate a potential emergency and should be followed up as soon as possible.  Feel free to call the clinic should you have any questions or concerns. The clinic phone number is [phone].  Please show the CHEMO ALERT CARD at check-in to the Emergency Department and triage nurse.   

## 2019-02-08 NOTE — Patient Instructions (Signed)

## 2019-02-09 ENCOUNTER — Ambulatory Visit: Payer: 59 | Admitting: Hematology and Oncology

## 2019-02-09 ENCOUNTER — Other Ambulatory Visit: Payer: 59

## 2019-02-09 ENCOUNTER — Ambulatory Visit: Payer: 59

## 2019-02-12 ENCOUNTER — Other Ambulatory Visit: Payer: Self-pay | Admitting: Hematology and Oncology

## 2019-02-16 ENCOUNTER — Other Ambulatory Visit: Payer: Self-pay

## 2019-02-16 ENCOUNTER — Other Ambulatory Visit: Payer: 59

## 2019-02-16 ENCOUNTER — Other Ambulatory Visit: Payer: Self-pay | Admitting: Hematology and Oncology

## 2019-02-16 ENCOUNTER — Ambulatory Visit: Payer: 59

## 2019-02-16 MED ORDER — TRAMADOL HCL 50 MG PO TABS: 50.0000 mg | ORAL_TABLET | Freq: Four times a day (QID) | ORAL | 0 refills | Status: DC | PRN

## 2019-02-16 MED ORDER — POTASSIUM CHLORIDE CRYS ER 20 MEQ PO TBCR: 20.0000 meq | EXTENDED_RELEASE_TABLET | Freq: Every day | ORAL | 0 refills | Status: DC

## 2019-02-17 ENCOUNTER — Telehealth: Payer: Self-pay

## 2019-02-17 MED ORDER — LORAZEPAM 1 MG PO TABS: 1.0000 mg | ORAL_TABLET | Freq: Once | ORAL | 0 refills | Status: AC

## 2019-02-17 NOTE — Telephone Encounter (Signed)
Pt called to request for a medication to help with feeling of claustrophobia prior to PET scan on 11/12.  Verbal orders from MD for Ativan 1mg  tablet 1 hour prior to PET scan.  Pt notified and voiced understanding and appreciation.

## 2019-02-25 ENCOUNTER — Other Ambulatory Visit: Payer: Self-pay

## 2019-02-25 ENCOUNTER — Ambulatory Visit (HOSPITAL_COMMUNITY)
Admission: RE | Admit: 2019-02-25 | Discharge: 2019-02-25 | Disposition: A | Discharge status: Home / Self Care | Payer: 59 | Source: Ambulatory Visit | Attending: Hematology and Oncology | Admitting: Hematology and Oncology

## 2019-02-25 DIAGNOSIS — C50512 Malignant neoplasm of lower-outer quadrant of left female breast: Secondary | ICD-10-CM | POA: Diagnosis present

## 2019-02-25 DIAGNOSIS — Z170 Estrogen receptor positive status [ER+]: Secondary | ICD-10-CM | POA: Diagnosis present

## 2019-02-25 DIAGNOSIS — C7951 Secondary malignant neoplasm of bone: Secondary | ICD-10-CM | POA: Diagnosis present

## 2019-02-25 LAB — GLUCOSE, CAPILLARY: Glucose-Capillary: 107 mg/dL — ABNORMAL HIGH (ref 70–99)

## 2019-02-25 MED ORDER — FLUDEOXYGLUCOSE F - 18 (FDG) INJECTION
4.9800 | Freq: Once | INTRAVENOUS | Status: AC | PRN
  Administered 2019-02-25: 4.98 via INTRAVENOUS

## 2019-02-28 NOTE — Progress Notes (Signed)
Patient Care Team: Redmon, Barth Kirks, PA-C as PCP - General (Nurse Practitioner)  DIAGNOSIS:    ICD-10-CM   1. Malignant neoplasm of lower-outer quadrant of left breast of female, estrogen receptor positive ([place])  C50.512    Z17.0     SUMMARY OF ONCOLOGIC HISTORY: Oncology History  Breast cancer of lower-outer quadrant of left female breast ([place])  12/13/2008 Initial Diagnosis   Left breast biopsy: Invasive ductal carcinoma ER 90% percent, PR 41%, Ki-67 11%, HER-2 negative ratio 1, Oncotype DX score 23, ROR15%   01/27/2009 - 03/10/2009 Neo-Adjuvant Chemotherapy   Neoadjuvant FEC 4; participant in the "bed sheets" study.   05/12/2009 -  Anti-estrogen oral therapy   Femara 2.5 mg daily   05/22/2009 Surgery   Bilateral mastectomy stage IIB T2 N0 M0 IDC left breast grade 1, 2.5 cm, ER 99%, PR 41%, Ki-67 11%, HER-2 negative   12/10/2009 Surgery   Breast reconstruction surgery   11/14/2017 Relapse/Recurrence   Mid back pain: MRI revealed T11 severe pathologic fracture, abnormal signal T10, T11 and T12, moderate canal stenosis at T11   12/03/2017 PET scan   Hypermetabolic metastatic breast cancer involving thoracic and upper abdominal lymph nodes, spine and ribs. There may be hypermetabolic adenopathy in the left neck; Hypermetabolic lymph node versus intramuscular metastasis involving the medial left pectoralis musculature Pathologic T11 fracture     12/11/2017 Procedure   Biopsy of T11 vertebral body lytic lesion: Metastatic breast adenocarcinoma ER positive, PR negative   12/19/2017 - 12/26/2017 Radiation Therapy   Palliative XRT to T 11   01/12/2018 - 10/26/2018 Anti-estrogen oral therapy   Patient could not tolerate Faslodex because of severe hypotension and severe pain in the legs after injections.  Ibrance with letrozole starting 01/12/2018   10/27/2018 -  Chemotherapy   Palliative chemotherapy with Halaven   Malignant neoplasm of lower-outer quadrant of left breast of female,  estrogen receptor positive ([place])  10/21/2018 Initial Diagnosis   Malignant neoplasm of lower-outer quadrant of left breast of female, estrogen receptor positive ([place])   10/27/2018 -  Chemotherapy   The patient had eriBULin mesylate (HALAVEN) 1.65 mg in sodium chloride 0.9 % 100 mL chemo infusion, 1.1 mg/m2 = 2.1 mg, Intravenous,  Once, 6 of 6 cycles Dose modification: 1.1 mg/m2 (original dose 1.4 mg/m2, Cycle 1, Reason: Other (see comments), Comment: CrCL < 50) Administration: 1.65 mg (10/27/2018), 1.65 mg (11/03/2018), 1.65 mg (11/17/2018), 1.65 mg (11/24/2018), 1.65 mg (12/08/2018), 1.65 mg (12/15/2018), 1.65 mg (12/29/2018), 1.65 mg (01/18/2019), 1.65 mg (02/08/2019)  for chemotherapy treatment.    11/17/2018 - 12/03/2018 Radiation Therapy   Palliative radiation to the T5 location of the spine and right femur, 35 Gy in 14 fractions     CHIEF COMPLIANT: Follow-up of metastatic breast cancer on Halaven, cycle7day1  INTERVAL HISTORY: Elizabeth [name] is a 63 y.o. with above-mentioned history of metastatic breast cancerwhohas had recurrent pleural effusions.Sheis currently on treatment with Halaven.PET scan on 02/25/19 showed a stable left pleural effusion, a new band of consolidation in the right upper and lower lobes, and new osseous lesions. She presents to the clinic todayfor cycle 7 and to review her scan.  She continues to have pain in her left rib cage.  Her breathing is slightly better.  She is able to walk without the walker.  She continues to stabilize in her weight.  She says that she is eating better.  REVIEW OF SYSTEMS:   Constitutional: Denies fevers, chills or abnormal weight loss Eyes: Denies blurriness  of vision Ears, nose, mouth, throat, and face: Denies mucositis or sore throat Respiratory: Shortness of breath and left rib cage pain Cardiovascular: Denies palpitation, chest discomfort Gastrointestinal: Denies nausea, heartburn or change in bowel habits Skin: Denies abnormal skin  rashes Lymphatics: Denies new lymphadenopathy or easy bruising Neurological: Denies numbness, tingling or new weaknesses Behavioral/Psych: Mood is stable, no new changes  Extremities: No lower extremity edema Breast: denies any pain or lumps or nodules in either breasts All other systems were reviewed with the patient and are negative.  I have reviewed the past medical history, past surgical history, social history and family history with the patient and they are unchanged from previous note.  ALLERGIES:  is allergic to shellfish allergy and sulfa antibiotics.  MEDICATIONS:  Current Outpatient Medications  Medication Sig Dispense Refill   Biotin 10000 MCG TABS Take 10,000 mcg by mouth daily.      Cholecalciferol (VITAMIN D PO) Take 5,000 Units by mouth daily.      DULoxetine (CYMBALTA) 30 MG capsule TAKE 1 CAPSULE (30 MG TOTAL) BY MOUTH 2 (TWO) TIMES DAILY. 180 capsule 2   fentaNYL (DURAGESIC) 25 MCG/HR Place 1 patch onto the skin every 3 (three) days. 10 patch 0   fluconazole (DIFLUCAN) 100 MG tablet Take 1 tablet (100 mg total) by mouth daily. 3 tablet 0   gabapentin (NEURONTIN) 300 MG capsule Take 1 capsule (300 mg total) by mouth 2 (two) times daily. 180 capsule 2   levothyroxine (SYNTHROID, LEVOTHROID) 25 MCG tablet Take 25 mcg by mouth daily before breakfast.      lidocaine-prilocaine (EMLA) cream Apply to affected area once 30 g 3   loperamide (IMODIUM) 2 MG capsule 1 to 2 PO QID prn diarrhea 30 capsule 22   LORazepam (ATIVAN) 0.5 MG tablet Take 1 tablet (0.5 mg total) by mouth every 6 (six) hours as needed (Nausea or vomiting). 30 tablet 0   magic mouthwash w/lidocaine SOLN Take 5 mLs by mouth 3 (three) times daily as needed for mouth pain. 100 mL 0   Multiple Vitamin (MULTIVITAMIN) tablet Take 1 tablet by mouth daily.     omeprazole (PRILOSEC) 20 MG capsule Take 1 capsule (20 mg total) by mouth daily. 90 capsule 0   ondansetron (ZOFRAN) 8 MG tablet Take 1 tablet (8  mg total) by mouth 2 (two) times daily as needed (Nausea or vomiting). 30 tablet 1   oxyCODONE-acetaminophen (PERCOCET) 10-325 MG tablet Take 1 tablet by mouth every 8 (eight) hours as needed for pain. 90 tablet 0   potassium chloride SA (KLOR-CON M20) 20 MEQ tablet Take 1 tablet (20 mEq total) by mouth daily. 90 tablet 0   prednisoLONE acetate (PRED FORTE) 1 % ophthalmic suspension Place 1 drop into both eyes daily.     prochlorperazine (COMPAZINE) 10 MG tablet Take 1 tablet (10 mg total) by mouth every 6 (six) hours as needed (Nausea or vomiting). 30 tablet 1   prochlorperazine (COMPAZINE) 5 MG tablet Take 1 tablet (5 mg total) by mouth every 6 (six) hours as needed for nausea or vomiting. 45 tablet 2   simethicone (GAS-X) 80 MG chewable tablet Chew 1 tablet (80 mg total) by mouth every 6 (six) hours as needed for flatulence. 120 tablet 2   traMADol (ULTRAM) 50 MG tablet Take 1 tablet (50 mg total) by mouth every 6 (six) hours as needed. 60 tablet 0   No current facility-administered medications for this visit.    Facility-Administered Medications Ordered in Other Visits  Medication Dose Route Frequency Provider Last Rate Last Dose   sodium chloride flush (NS) 0.9 % injection 10 mL  10 mL Intravenous PRN Nicholas Lose, MD   10 mL at 03/01/19 1000    PHYSICAL EXAMINATION: ECOG PERFORMANCE STATUS: 1 - Symptomatic but completely ambulatory  There were no vitals filed for this visit. There were no vitals filed for this visit.  GENERAL: alert, no distress and comfortable SKIN: skin color, texture, turgor are normal, no rashes or significant lesions EYES: normal, Conjunctiva are pink and non-injected, sclera clear OROPHARYNX: no exudate, no erythema and lips, buccal mucosa, and tongue normal  NECK: supple, thyroid normal size, non-tender, without nodularity LYMPH: no palpable lymphadenopathy in the cervical, axillary or inguinal LUNGS: clear to auscultation and percussion with normal  breathing effort HEART: regular rate & rhythm and no murmurs and no lower extremity edema ABDOMEN: abdomen soft, non-tender and normal bowel sounds MUSCULOSKELETAL: no cyanosis of digits and no clubbing  NEURO: alert & oriented x 3 with fluent speech, no focal motor/sensory deficits EXTREMITIES: No lower extremity edema  LABORATORY DATA:  I have reviewed the data as listed CMP Latest Ref Rng & Units 02/08/2019 01/18/2019 12/29/2018  Glucose 70 - 99 mg/dL 107(H) 111(H) 95  BUN 8 - 23 mg/dL 17 13 6(L)  Creatinine 0.44 - 1.00 mg/dL 0.85 0.82 0.70  Sodium 135 - 145 mmol/L 137 138 140  Potassium 3.5 - 5.1 mmol/L 3.9 4.2 3.0(LL)  Chloride 98 - 111 mmol/L 102 103 102  CO2 22 - 32 mmol/L '25 26 29  ' Calcium 8.9 - 10.3 mg/dL 9.0 8.9 8.4(L)  Total Protein 6.5 - 8.1 g/dL 6.5 6.8 6.1(L)  Total Bilirubin 0.3 - 1.2 mg/dL 0.3 0.3 0.4  Alkaline Phos 38 - 126 U/L 139(H) 129(H) 130(H)  AST 15 - 41 U/L '23 19 20  ' ALT 0 - 44 U/L '14 10 9    ' Lab Results  Component Value Date   WBC 4.5 02/08/2019   HGB 9.7 (L) 02/08/2019   HCT 31.2 (L) 02/08/2019   MCV 92.9 02/08/2019   PLT 465 (H) 02/08/2019   NEUTROABS 3.3 02/08/2019    ASSESSMENT & PLAN:  Breast cancer of lower-outer quadrant of left female breast Left breast cancer invasive ductal carcinoma 2.5 cm in size grade 1, ER 99%, PR 41%, Ki-67 11%, HER-2 negative T2 N0 M0 stage II a status post neoadjuvant chemotherapy followed by surgery February 2011and wason antiestrogen therapy with Femara from January 2011-July 2017  New onset mid back pain: MRI revealedT11 severe pathologic fracture, abnormal signal T10, T11 and T12, moderate canal stenosis at T11  12/02/2017: PET CT scan hypermetabolic lymphadenopathy in the thoracic and upper abdominal lymph nodes, spine and rib metastases, intramuscular metastases the left pectoralis muscle, T11 vertebral fracture pathologic  Biopsy of the T11 vertebral body ER 90%, PR 0%, Ki-67 10%, HER-2 -1+, foundation  1, PDL 1 Pathologic fracture T11: She met with orthopedics regarding kyphoplasty and they determined that there is no surgical options available for her. ------------------------------------------------------------------- Treatment : 1. Palliative radiation to T11 vertebral body and the pectoralis muscle: 12/20/17-12/26/17 2. Ibrance with letrozole along with Delton See or Zometadiscontinued July 2020 due to progression Patient could not tolerate Faslodex because of intense bone pain as well as hypotension. ---------------------------------------------------------------------------------------------------------------------- Current treatment: Halaven started 10/27/2018, day 1and8 every 3 weeks, today cycle4, being changed to once every 3 weeks  02/25/2019: PET CT scan: Hypermetabolic left axillary, left internal mammary and subcarinal lymph node and precordial lymph node.  Right lower lobe consolidation?  Infection, adrenal metastases, new focal lesions in the skeleton compared to PET scan done in August 2019.  Many do not have metabolic activity.  Large loculated left pleural effusion  Radiology review: I discussed with the patient that the PET CT scan suggests persistent metastatic disease.  Because they are not able to compare to the recent CT scan it is hard to infer whether there is truly response to the treatment.  Plan: Continue with current treatment and rescan in 2 to 3 months    No orders of the defined types were placed in this encounter.  The patient has a good understanding of the overall plan. she agrees with it. she will call with any problems that may develop before the next visit here.  Nicholas Lose, MD 03/01/2019  Julious Oka Dorshimer, am acting as scribe for Dr. Nicholas Lose.  I have reviewed the above documentation for accuracy and completeness, and I agree with the above.

## 2019-03-01 ENCOUNTER — Inpatient Hospital Stay: Payer: 59

## 2019-03-01 ENCOUNTER — Inpatient Hospital Stay: Payer: 59 | Attending: Hematology and Oncology

## 2019-03-01 ENCOUNTER — Inpatient Hospital Stay (HOSPITAL_BASED_OUTPATIENT_CLINIC_OR_DEPARTMENT_OTHER): Payer: 59 | Admitting: Hematology and Oncology

## 2019-03-01 ENCOUNTER — Other Ambulatory Visit: Payer: Self-pay

## 2019-03-01 ENCOUNTER — Other Ambulatory Visit: Payer: Self-pay | Admitting: Hematology and Oncology

## 2019-03-01 VITALS — BP 146/96 | HR 112

## 2019-03-01 DIAGNOSIS — Z79891 Long term (current) use of opiate analgesic: Secondary | ICD-10-CM | POA: Insufficient documentation

## 2019-03-01 DIAGNOSIS — Z170 Estrogen receptor positive status [ER+]: Secondary | ICD-10-CM | POA: Diagnosis not present

## 2019-03-01 DIAGNOSIS — Z95828 Presence of other vascular implants and grafts: Secondary | ICD-10-CM

## 2019-03-01 DIAGNOSIS — Z79899 Other long term (current) drug therapy: Secondary | ICD-10-CM | POA: Diagnosis not present

## 2019-03-01 DIAGNOSIS — C50512 Malignant neoplasm of lower-outer quadrant of left female breast: Secondary | ICD-10-CM

## 2019-03-01 DIAGNOSIS — Z5111 Encounter for antineoplastic chemotherapy: Secondary | ICD-10-CM | POA: Diagnosis not present

## 2019-03-01 DIAGNOSIS — Z7189 Other specified counseling: Secondary | ICD-10-CM

## 2019-03-01 DIAGNOSIS — G893 Neoplasm related pain (acute) (chronic): Secondary | ICD-10-CM | POA: Diagnosis not present

## 2019-03-01 DIAGNOSIS — Z9013 Acquired absence of bilateral breasts and nipples: Secondary | ICD-10-CM | POA: Insufficient documentation

## 2019-03-01 DIAGNOSIS — C7951 Secondary malignant neoplasm of bone: Secondary | ICD-10-CM | POA: Diagnosis present

## 2019-03-01 LAB — CBC WITH DIFFERENTIAL (CANCER CENTER ONLY)
Abs Immature Granulocytes: 0.02 10*3/uL (ref 0.00–0.07)
Basophils Absolute: 0 10*3/uL (ref 0.0–0.1)
Basophils Relative: 1 %
Eosinophils Absolute: 0.1 10*3/uL (ref 0.0–0.5)
Eosinophils Relative: 1 %
HCT: 35.2 % — ABNORMAL LOW (ref 36.0–46.0)
Hemoglobin: 10.8 g/dL — ABNORMAL LOW (ref 12.0–15.0)
Immature Granulocytes: 1 %
Lymphocytes Relative: 14 %
Lymphs Abs: 0.6 10*3/uL — ABNORMAL LOW (ref 0.7–4.0)
MCH: 29.3 pg (ref 26.0–34.0)
MCHC: 30.7 g/dL (ref 30.0–36.0)
MCV: 95.4 fL (ref 80.0–100.0)
Monocytes Absolute: 0.5 10*3/uL (ref 0.1–1.0)
Monocytes Relative: 12 %
Neutro Abs: 3 10*3/uL (ref 1.7–7.7)
Neutrophils Relative %: 71 %
Platelet Count: 464 10*3/uL — ABNORMAL HIGH (ref 150–400)
RBC: 3.69 MIL/uL — ABNORMAL LOW (ref 3.87–5.11)
RDW: 17.2 % — ABNORMAL HIGH (ref 11.5–15.5)
WBC Count: 4.3 10*3/uL (ref 4.0–10.5)
nRBC: 0 % (ref 0.0–0.2)

## 2019-03-01 LAB — CMP (CANCER CENTER ONLY)
ALT: 14 U/L (ref 0–44)
AST: 21 U/L (ref 15–41)
Albumin: 3.1 g/dL — ABNORMAL LOW (ref 3.5–5.0)
Alkaline Phosphatase: 141 U/L — ABNORMAL HIGH (ref 38–126)
Anion gap: 10 (ref 5–15)
BUN: 13 mg/dL (ref 8–23)
CO2: 27 mmol/L (ref 22–32)
Calcium: 9.1 mg/dL (ref 8.9–10.3)
Chloride: 103 mmol/L (ref 98–111)
Creatinine: 0.81 mg/dL (ref 0.44–1.00)
GFR, Est AFR Am: >60 mL/min (ref >60)
GFR, Estimated: >60 mL/min (ref >60)
Glucose, Bld: 87 mg/dL (ref 70–99)
Potassium: 4.3 mmol/L (ref 3.5–5.1)
Sodium: 140 mmol/L (ref 135–145)
Total Bilirubin: <0.2 mg/dL — ABNORMAL LOW (ref 0.3–1.2)
Total Protein: 6.6 g/dL (ref 6.5–8.1)

## 2019-03-01 MED ORDER — SODIUM CHLORIDE 0.9% FLUSH
10.0000 mL | INTRAVENOUS | Status: DC | PRN
  Administered 2019-03-01: 10 mL
  Filled 2019-03-01: qty 10

## 2019-03-01 MED ORDER — SODIUM CHLORIDE 0.9 % IV SOLN
Freq: Once | INTRAVENOUS | Status: AC
  Administered 2019-03-01: 11:00:00 via INTRAVENOUS
  Filled 2019-03-01: qty 250

## 2019-03-01 MED ORDER — ALTEPLASE 2 MG IJ SOLR
2.0000 mg | Freq: Once | INTRAMUSCULAR | Status: AC | PRN
  Administered 2019-03-01: 2 mg
  Filled 2019-03-01: qty 2

## 2019-03-01 MED ORDER — SODIUM CHLORIDE 0.9 % IV SOLN
1.1000 mg/m2 | Freq: Once | INTRAVENOUS | Status: AC
  Administered 2019-03-01: 1.65 mg via INTRAVENOUS
  Filled 2019-03-01: qty 3.3

## 2019-03-01 MED ORDER — ZOLEDRONIC ACID 4 MG/100ML IV SOLN: 4.0000 mg | Freq: Once | INTRAVENOUS | Status: DC

## 2019-03-01 MED ORDER — PROCHLORPERAZINE MALEATE 10 MG PO TABS
ORAL_TABLET | ORAL | Status: AC
  Filled 2019-03-01: qty 1

## 2019-03-01 MED ORDER — PROCHLORPERAZINE MALEATE 10 MG PO TABS
10.0000 mg | ORAL_TABLET | Freq: Once | ORAL | Status: AC
  Administered 2019-03-01: 10 mg via ORAL

## 2019-03-01 MED ORDER — HEPARIN SOD (PORK) LOCK FLUSH 100 UNIT/ML IV SOLN
500.0000 [IU] | Freq: Once | INTRAVENOUS | Status: AC | PRN
  Administered 2019-03-01: 500 [IU]
  Filled 2019-03-01: qty 5

## 2019-03-01 MED ORDER — SODIUM CHLORIDE 0.9% FLUSH
10.0000 mL | INTRAVENOUS | Status: DC | PRN
  Administered 2019-03-01: 10 mL via INTRAVENOUS
  Filled 2019-03-01: qty 10

## 2019-03-01 MED ORDER — ALTEPLASE 2 MG IJ SOLR
INTRAMUSCULAR | Status: AC
  Filled 2019-03-01: qty 2

## 2019-03-01 NOTE — Progress Notes (Signed)
Per Dr. Lindi Adie okay to treat with HR 112

## 2019-03-01 NOTE — Progress Notes (Signed)
Unable to get blood return from port. Cathflo administered by [employer], labs drawn from arm by [facility] LPN

## 2019-03-01 NOTE — Assessment & Plan Note (Signed)
Left breast cancer invasive ductal carcinoma 2.5 cm in size grade 1, ER 99%, PR 41%, Ki-67 11%, HER-2 negative T2 N0 M0 stage II a status post neoadjuvant chemotherapy followed by surgery February 2011and wason antiestrogen therapy with Femara from January 2011-July 2017  New onset mid back pain: MRI revealedT11 severe pathologic fracture, abnormal signal T10, T11 and T12, moderate canal stenosis at T11  12/02/2017: PET CT scan hypermetabolic lymphadenopathy in the thoracic and upper abdominal lymph nodes, spine and rib metastases, intramuscular metastases the left pectoralis muscle, T11 vertebral fracture pathologic  Biopsy of the T11 vertebral body ER 90%, PR 0%, Ki-67 10%, HER-2 -1+, foundation 1, PDL 1 Pathologic fracture T11: She met with orthopedics regarding kyphoplasty and they determined that there is no surgical options available for her. ------------------------------------------------------------------- Treatment : 1. Palliative radiation to T11 vertebral body and the pectoralis muscle: 12/20/17-12/26/17 2. Ibrance with letrozole along with Delton See or Zometadiscontinued July 2020 due to progression Patient could not tolerate Faslodex because of intense bone pain as well as hypotension. ---------------------------------------------------------------------------------------------------------------------- Current treatment: Halaven started 10/27/2018, day 1and8 every 3 weeks, today cycle4, being changed to once every 3 weeks  02/25/2019: PET CT scan: Hypermetabolic left axillary, left internal mammary and subcarinal lymph node and precordial lymph node.  Right lower lobe consolidation?  Infection, adrenal metastases, new focal lesions in the skeleton compared to PET scan done in August 2019.  Many do not have metabolic activity.  Large loculated left pleural effusion  Radiology review: I discussed with the patient that the PET CT scan suggests persistent metastatic disease.   Because they are not able to compare to the recent CT scan it is hard to infer whether there is truly response to the treatment.  Plan: Continue with current treatment and rescan in 2 to 3 months

## 2019-03-01 NOTE — Progress Notes (Signed)
Per Dr Lindi Adie OK for eribulin with HR > 100 (110-113)

## 2019-03-01 NOTE — Patient Instructions (Signed)
[facility] Discharge Instructions for Patients Receiving Chemotherapy  Today you received the following chemotherapy agents:  Eribulin  To help prevent nausea and vomiting after your treatment, we encourage you to take your nausea medication as prescribed.   If you develop nausea and vomiting that is not controlled by your nausea medication, call the clinic.   BELOW ARE SYMPTOMS THAT SHOULD BE REPORTED IMMEDIATELY:  *FEVER GREATER THAN 100.5 F  *CHILLS WITH OR WITHOUT FEVER  NAUSEA AND VOMITING THAT IS NOT CONTROLLED WITH YOUR NAUSEA MEDICATION  *UNUSUAL SHORTNESS OF BREATH  *UNUSUAL BRUISING OR BLEEDING  TENDERNESS IN MOUTH AND THROAT WITH OR WITHOUT PRESENCE OF ULCERS  *URINARY PROBLEMS  *BOWEL PROBLEMS  UNUSUAL RASH Items with * indicate a potential emergency and should be followed up as soon as possible.  Feel free to call the clinic should you have any questions or concerns. The clinic phone number is [phone].  Please show the [place] at check-in to the Emergency Department and triage nurse.

## 2019-03-01 NOTE — Progress Notes (Signed)
41ml of blood removed and wasted after cath-flo at 1107

## 2019-03-02 ENCOUNTER — Other Ambulatory Visit: Payer: 59

## 2019-03-02 ENCOUNTER — Ambulatory Visit: Payer: 59 | Admitting: Hematology and Oncology

## 2019-03-02 ENCOUNTER — Ambulatory Visit: Payer: 59

## 2019-03-15 ENCOUNTER — Other Ambulatory Visit: Payer: Self-pay | Admitting: Hematology and Oncology

## 2019-03-15 MED ORDER — FENTANYL 25 MCG/HR TD PT72: 1.0000 | MEDICATED_PATCH | TRANSDERMAL | 0 refills | Status: DC

## 2019-03-21 ENCOUNTER — Other Ambulatory Visit: Payer: Self-pay | Admitting: Hematology and Oncology

## 2019-03-21 DIAGNOSIS — C50512 Malignant neoplasm of lower-outer quadrant of left female breast: Secondary | ICD-10-CM

## 2019-03-21 DIAGNOSIS — Z7189 Other specified counseling: Secondary | ICD-10-CM

## 2019-03-21 NOTE — Progress Notes (Signed)
Patient Care Team: Redmon, Barth Kirks, PA-C as PCP - General (Nurse Practitioner)  DIAGNOSIS:    ICD-10-CM   1. Malignant neoplasm of lower-outer quadrant of left breast of female, estrogen receptor positive ([place])  C50.512    Z17.0     SUMMARY OF ONCOLOGIC HISTORY: Oncology History  Breast cancer of lower-outer quadrant of left female breast ([place])  12/13/2008 Initial Diagnosis   Left breast biopsy: Invasive ductal carcinoma ER 90% percent, PR 41%, Ki-67 11%, HER-2 negative ratio 1, Oncotype DX score 23, ROR15%   01/27/2009 - 03/10/2009 Neo-Adjuvant Chemotherapy   Neoadjuvant FEC 4; participant in the "bed sheets" study.   05/12/2009 -  Anti-estrogen oral therapy   Femara 2.5 mg daily   05/22/2009 Surgery   Bilateral mastectomy stage IIB T2 N0 M0 IDC left breast grade 1, 2.5 cm, ER 99%, PR 41%, Ki-67 11%, HER-2 negative   12/10/2009 Surgery   Breast reconstruction surgery   11/14/2017 Relapse/Recurrence   Mid back pain: MRI revealed T11 severe pathologic fracture, abnormal signal T10, T11 and T12, moderate canal stenosis at T11   12/03/2017 PET scan   Hypermetabolic metastatic breast cancer involving thoracic and upper abdominal lymph nodes, spine and ribs. There may be hypermetabolic adenopathy in the left neck; Hypermetabolic lymph node versus intramuscular metastasis involving the medial left pectoralis musculature Pathologic T11 fracture     12/11/2017 Procedure   Biopsy of T11 vertebral body lytic lesion: Metastatic breast adenocarcinoma ER positive, PR negative   12/19/2017 - 12/26/2017 Radiation Therapy   Palliative XRT to T 11   01/12/2018 - 10/26/2018 Anti-estrogen oral therapy   Patient could not tolerate Faslodex because of severe hypotension and severe pain in the legs after injections.  Ibrance with letrozole starting 01/12/2018   10/27/2018 -  Chemotherapy   Palliative chemotherapy with Halaven   Malignant neoplasm of lower-outer quadrant of left breast of female,  estrogen receptor positive ([place])  10/21/2018 Initial Diagnosis   Malignant neoplasm of lower-outer quadrant of left breast of female, estrogen receptor positive ([place])   10/27/2018 -  Chemotherapy   The patient had eriBULin mesylate (HALAVEN) 1.65 mg in sodium chloride 0.9 % 100 mL chemo infusion, 1.1 mg/m2 = 2.1 mg, Intravenous,  Once, 7 of 10 cycles Dose modification: 1.1 mg/m2 (original dose 1.4 mg/m2, Cycle 1, Reason: Other (see comments), Comment: CrCL < 50) Administration: 1.65 mg (10/27/2018), 1.65 mg (11/03/2018), 1.65 mg (11/17/2018), 1.65 mg (11/24/2018), 1.65 mg (12/08/2018), 1.65 mg (12/15/2018), 1.65 mg (12/29/2018), 1.65 mg (01/18/2019), 1.65 mg (02/08/2019), 1.65 mg (03/01/2019)  for chemotherapy treatment.    11/17/2018 - 12/03/2018 Radiation Therapy   Palliative radiation to the T5 location of the spine and right femur, 35 Gy in 14 fractions     CHIEF COMPLIANT: Follow-up of metastatic breast cancer on Halaven, cycle8day1  INTERVAL HISTORY: Elizabeth [name] is a 63 y.o. with above-mentioned history of metastatic breast cancerwhohas had recurrent pleural effusions.Sheis currently on treatment with Halaven.She presents to the clinic todayfor cycle 8. She tells me that she is breathing better and is feeling better.  She has gained some weight.  She is eating better She is inquiring today about immunotherapy as an option.  REVIEW OF SYSTEMS:   Constitutional: Denies fevers, chills or abnormal weight loss Eyes: Denies blurriness of vision Ears, nose, mouth, throat, and face: Denies mucositis or sore throat Respiratory: Denies cough, dyspnea or wheezes Cardiovascular: Denies palpitation, chest discomfort Gastrointestinal: Denies nausea, heartburn or change in bowel habits Skin: Denies abnormal skin rashes  Lymphatics: Denies new lymphadenopathy or easy bruising Neurological: Denies numbness, tingling or new weaknesses Behavioral/Psych: Mood is stable, no new changes  Extremities: No  lower extremity edema Breast: denies any pain or lumps or nodules in either breasts All other systems were reviewed with the patient and are negative.  I have reviewed the past medical history, past surgical history, social history and family history with the patient and they are unchanged from previous note.  ALLERGIES:  is allergic to shellfish allergy and sulfa antibiotics.  MEDICATIONS:  Current Outpatient Medications  Medication Sig Dispense Refill  . Biotin 10000 MCG TABS Take 10,000 mcg by mouth daily.     . Cholecalciferol (VITAMIN D PO) Take 5,000 Units by mouth daily.     . DULoxetine (CYMBALTA) 30 MG capsule TAKE 1 CAPSULE (30 MG TOTAL) BY MOUTH 2 (TWO) TIMES DAILY. 180 capsule 2  . fentaNYL (DURAGESIC) 25 MCG/HR Place 1 patch onto the skin every 3 (three) days. 10 patch 0  . fluconazole (DIFLUCAN) 100 MG tablet Take 1 tablet (100 mg total) by mouth daily. 3 tablet 0  . gabapentin (NEURONTIN) 300 MG capsule Take 1 capsule (300 mg total) by mouth 2 (two) times daily. 180 capsule 2  . levothyroxine (SYNTHROID, LEVOTHROID) 25 MCG tablet Take 25 mcg by mouth daily before breakfast.     . lidocaine-prilocaine (EMLA) cream Apply to affected area once 30 g 3  . loperamide (IMODIUM) 2 MG capsule 1 to 2 PO QID prn diarrhea 30 capsule 22  . LORazepam (ATIVAN) 0.5 MG tablet Take 1 tablet (0.5 mg total) by mouth every 6 (six) hours as needed (Nausea or vomiting). 30 tablet 0  . magic mouthwash w/lidocaine SOLN Take 5 mLs by mouth 3 (three) times daily as needed for mouth pain. 100 mL 0  . Multiple Vitamin (MULTIVITAMIN) tablet Take 1 tablet by mouth daily.    Marland Kitchen omeprazole (PRILOSEC) 20 MG capsule Take 1 capsule (20 mg total) by mouth daily. 90 capsule 0  . ondansetron (ZOFRAN) 8 MG tablet TAKE 1 TABLET (8 MG TOTAL) BY MOUTH 2 (TWO) TIMES DAILY AS NEEDED (NAUSEA OR VOMITING). 30 tablet 1  . oxyCODONE-acetaminophen (PERCOCET) 10-325 MG tablet Take 1 tablet by mouth every 8 (eight) hours as  needed for pain. 90 tablet 0  . potassium chloride SA (KLOR-CON M20) 20 MEQ tablet Take 1 tablet (20 mEq total) by mouth daily. 90 tablet 0  . prednisoLONE acetate (PRED FORTE) 1 % ophthalmic suspension Place 1 drop into both eyes daily.    . prochlorperazine (COMPAZINE) 10 MG tablet Take 1 tablet (10 mg total) by mouth every 6 (six) hours as needed (Nausea or vomiting). 30 tablet 1  . prochlorperazine (COMPAZINE) 5 MG tablet Take 1 tablet (5 mg total) by mouth every 6 (six) hours as needed for nausea or vomiting. 45 tablet 2  . simethicone (GAS-X) 80 MG chewable tablet Chew 1 tablet (80 mg total) by mouth every 6 (six) hours as needed for flatulence. 120 tablet 2  . traMADol (ULTRAM) 50 MG tablet Take 1 tablet (50 mg total) by mouth every 6 (six) hours as needed. 60 tablet 0   No current facility-administered medications for this visit.     PHYSICAL EXAMINATION: ECOG PERFORMANCE STATUS: 1 - Symptomatic but completely ambulatory  Vitals:   03/22/19 1350  BP: (!) 137/99  Pulse: (!) 108  Resp: 17  Temp: 98 F (36.7 C)  SpO2: 97%   Filed Weights   03/22/19 1350  Weight:  96 lb (43.5 kg)    GENERAL: alert, no distress and comfortable SKIN: skin color, texture, turgor are normal, no rashes or significant lesions EYES: normal, Conjunctiva are pink and non-injected, sclera clear OROPHARYNX: no exudate, no erythema and lips, buccal mucosa, and tongue normal  NECK: supple, thyroid normal size, non-tender, without nodularity LYMPH: no palpable lymphadenopathy in the cervical, axillary or inguinal LUNGS: clear to auscultation and percussion with normal breathing effort HEART: regular rate & rhythm and no murmurs and no lower extremity edema ABDOMEN: abdomen soft, non-tender and normal bowel sounds MUSCULOSKELETAL: no cyanosis of digits and no clubbing  NEURO: alert & oriented x 3 with fluent speech, no focal motor/sensory deficits EXTREMITIES: No lower extremity edema  LABORATORY DATA:   I have reviewed the data as listed CMP Latest Ref Rng & Units 03/01/2019 02/08/2019 01/18/2019  Glucose 70 - 99 mg/dL 87 107(H) 111(H)  BUN 8 - 23 mg/dL '13 17 13  ' Creatinine 0.44 - 1.00 mg/dL 0.81 0.85 0.82  Sodium 135 - 145 mmol/L 140 137 138  Potassium 3.5 - 5.1 mmol/L 4.3 3.9 4.2  Chloride 98 - 111 mmol/L 103 102 103  CO2 22 - 32 mmol/L '27 25 26  ' Calcium 8.9 - 10.3 mg/dL 9.1 9.0 8.9  Total Protein 6.5 - 8.1 g/dL 6.6 6.5 6.8  Total Bilirubin 0.3 - 1.2 mg/dL <0.2(L) 0.3 0.3  Alkaline Phos 38 - 126 U/L 141(H) 139(H) 129(H)  AST 15 - 41 U/L '21 23 19  ' ALT 0 - 44 U/L '14 14 10    ' Lab Results  Component Value Date   WBC 4.8 03/22/2019   HGB 11.1 (L) 03/22/2019   HCT 34.6 (L) 03/22/2019   MCV 92.5 03/22/2019   PLT 438 (H) 03/22/2019   NEUTROABS 3.2 03/22/2019    ASSESSMENT & PLAN:  Breast cancer of lower-outer quadrant of left female breast Left breast cancer invasive ductal carcinoma 2.5 cm in size grade 1, ER 99%, PR 41%, Ki-67 11%, HER-2 negative T2 N0 M0 stage II a status post neoadjuvant chemotherapy followed by surgery February 2011and wason antiestrogen therapy with Femara from January 2011-July 2017  New onset mid back pain: MRI revealedT11 severe pathologic fracture, abnormal signal T10, T11 and T12, moderate canal stenosis at T11  12/02/2017: PET CT scan hypermetabolic lymphadenopathy in the thoracic and upper abdominal lymph nodes, spine and rib metastases, intramuscular metastases the left pectoralis muscle, T11 vertebral fracture pathologic  Biopsy of the T11 vertebral body ER 90%, PR 0%, Ki-67 10%, HER-2 -1+, foundation 1, PDL 1 Pathologic fracture T11: She met with orthopedics regarding kyphoplasty and they determined that there is no surgical options available for her. ------------------------------------------------------------------- Treatment : 1. Palliative radiation to T11 vertebral body and the pectoralis muscle: 12/20/17-12/26/17 2. Ibrance with  letrozole along with Delton See or Zometadiscontinued July 2020 due to progression Patient could not tolerate Faslodex because of intense bone pain as well as hypotension. ---------------------------------------------------------------------------------------------------------------------- Current treatment: Halaven started 10/27/2018, today is cycle 8 (currently being given once every 3 weeks)  02/25/2019: PET CT scan: Hypermetabolic left axillary, left internal mammary and subcarinal lymph node and precordial lymph node.  Right lower lobe consolidation?  Infection, adrenal metastases, new focal lesions in the skeleton compared to PET scan done in August 2019.  Many do not have metabolic activity.  Large loculated left pleural effusion  We will plan to perform next scan in January. We discussed the role of immunotherapy and treatment of breast cancer. I will look for the foundation  1 testing results. If not, we will order guardant 360.  Return to clinic in 3 weeks to discuss PD-L1 test results and to consider adding pembrolizumab to her treatment.  No orders of the defined types were placed in this encounter.  The patient has a good understanding of the overall plan. she agrees with it. she will call with any problems that may develop before the next visit here.  Nicholas Lose, MD 03/22/2019  Julious Oka Dorshimer, am acting as scribe for Dr. Nicholas Lose.  I have reviewed the above documentation for accuracy and completeness, and I agree with the above.

## 2019-03-22 ENCOUNTER — Inpatient Hospital Stay: Payer: Medicare Other | Attending: Hematology and Oncology | Admitting: Hematology and Oncology

## 2019-03-22 ENCOUNTER — Inpatient Hospital Stay: Payer: Medicare Other

## 2019-03-22 ENCOUNTER — Other Ambulatory Visit: Payer: Self-pay

## 2019-03-22 VITALS — BP 119/73 | HR 100

## 2019-03-22 DIAGNOSIS — C7951 Secondary malignant neoplasm of bone: Secondary | ICD-10-CM | POA: Insufficient documentation

## 2019-03-22 DIAGNOSIS — Z5111 Encounter for antineoplastic chemotherapy: Secondary | ICD-10-CM | POA: Diagnosis not present

## 2019-03-22 DIAGNOSIS — C50512 Malignant neoplasm of lower-outer quadrant of left female breast: Secondary | ICD-10-CM | POA: Diagnosis present

## 2019-03-22 DIAGNOSIS — Z79891 Long term (current) use of opiate analgesic: Secondary | ICD-10-CM | POA: Insufficient documentation

## 2019-03-22 DIAGNOSIS — Z7189 Other specified counseling: Secondary | ICD-10-CM

## 2019-03-22 DIAGNOSIS — C7989 Secondary malignant neoplasm of other specified sites: Secondary | ICD-10-CM | POA: Insufficient documentation

## 2019-03-22 DIAGNOSIS — Z923 Personal history of irradiation: Secondary | ICD-10-CM | POA: Diagnosis not present

## 2019-03-22 DIAGNOSIS — C778 Secondary and unspecified malignant neoplasm of lymph nodes of multiple regions: Secondary | ICD-10-CM | POA: Insufficient documentation

## 2019-03-22 DIAGNOSIS — Z170 Estrogen receptor positive status [ER+]: Secondary | ICD-10-CM

## 2019-03-22 DIAGNOSIS — Z9013 Acquired absence of bilateral breasts and nipples: Secondary | ICD-10-CM | POA: Insufficient documentation

## 2019-03-22 DIAGNOSIS — J90 Pleural effusion, not elsewhere classified: Secondary | ICD-10-CM | POA: Diagnosis not present

## 2019-03-22 DIAGNOSIS — Z79899 Other long term (current) drug therapy: Secondary | ICD-10-CM | POA: Diagnosis not present

## 2019-03-22 LAB — CBC WITH DIFFERENTIAL (CANCER CENTER ONLY)
Abs Immature Granulocytes: 0.02 10*3/uL (ref 0.00–0.07)
Basophils Absolute: 0 10*3/uL (ref 0.0–0.1)
Basophils Relative: 1 %
Eosinophils Absolute: 0 10*3/uL (ref 0.0–0.5)
Eosinophils Relative: 1 %
HCT: 34.6 % — ABNORMAL LOW (ref 36.0–46.0)
Hemoglobin: 11.1 g/dL — ABNORMAL LOW (ref 12.0–15.0)
Immature Granulocytes: 0 %
Lymphocytes Relative: 17 %
Lymphs Abs: 0.8 10*3/uL (ref 0.7–4.0)
MCH: 29.7 pg (ref 26.0–34.0)
MCHC: 32.1 g/dL (ref 30.0–36.0)
MCV: 92.5 fL (ref 80.0–100.0)
Monocytes Absolute: 0.6 10*3/uL (ref 0.1–1.0)
Monocytes Relative: 13 %
Neutro Abs: 3.2 10*3/uL (ref 1.7–7.7)
Neutrophils Relative %: 68 %
Platelet Count: 438 10*3/uL — ABNORMAL HIGH (ref 150–400)
RBC: 3.74 MIL/uL — ABNORMAL LOW (ref 3.87–5.11)
RDW: 16.8 % — ABNORMAL HIGH (ref 11.5–15.5)
WBC Count: 4.8 10*3/uL (ref 4.0–10.5)
nRBC: 0 % (ref 0.0–0.2)

## 2019-03-22 LAB — CMP (CANCER CENTER ONLY)
ALT: 24 U/L (ref 0–44)
AST: 34 U/L (ref 15–41)
Albumin: 3.2 g/dL — ABNORMAL LOW (ref 3.5–5.0)
Alkaline Phosphatase: 159 U/L — ABNORMAL HIGH (ref 38–126)
Anion gap: 7 (ref 5–15)
BUN: 17 mg/dL (ref 8–23)
CO2: 29 mmol/L (ref 22–32)
Calcium: 8.9 mg/dL (ref 8.9–10.3)
Chloride: 102 mmol/L (ref 98–111)
Creatinine: 0.85 mg/dL (ref 0.44–1.00)
GFR, Est AFR Am: >60 mL/min (ref >60)
GFR, Estimated: >60 mL/min (ref >60)
Glucose, Bld: 100 mg/dL — ABNORMAL HIGH (ref 70–99)
Potassium: 4.4 mmol/L (ref 3.5–5.1)
Sodium: 138 mmol/L (ref 135–145)
Total Bilirubin: 0.2 mg/dL — ABNORMAL LOW (ref 0.3–1.2)
Total Protein: 6.8 g/dL (ref 6.5–8.1)

## 2019-03-22 MED ORDER — PROCHLORPERAZINE MALEATE 10 MG PO TABS
ORAL_TABLET | ORAL | Status: AC
  Filled 2019-03-22: qty 1

## 2019-03-22 MED ORDER — SODIUM CHLORIDE 0.9 % IV SOLN
1.1000 mg/m2 | Freq: Once | INTRAVENOUS | Status: AC
  Administered 2019-03-22: 1.65 mg via INTRAVENOUS
  Filled 2019-03-22: qty 3.3

## 2019-03-22 MED ORDER — PROCHLORPERAZINE MALEATE 10 MG PO TABS
10.0000 mg | ORAL_TABLET | Freq: Once | ORAL | Status: AC
  Administered 2019-03-22: 10 mg via ORAL

## 2019-03-22 MED ORDER — SODIUM CHLORIDE 0.9% FLUSH
10.0000 mL | INTRAVENOUS | Status: DC | PRN
  Administered 2019-03-22: 10 mL
  Filled 2019-03-22: qty 10

## 2019-03-22 MED ORDER — HEPARIN SOD (PORK) LOCK FLUSH 100 UNIT/ML IV SOLN
500.0000 [IU] | Freq: Once | INTRAVENOUS | Status: AC | PRN
  Administered 2019-03-22: 500 [IU]
  Filled 2019-03-22: qty 5

## 2019-03-22 MED ORDER — SODIUM CHLORIDE 0.9% FLUSH
10.0000 mL | Freq: Once | INTRAVENOUS | Status: AC | PRN
  Administered 2019-03-22: 13:00:00 10 mL
  Filled 2019-03-22: qty 10

## 2019-03-22 MED ORDER — SODIUM CHLORIDE 0.9 % IV SOLN
Freq: Once | INTRAVENOUS | Status: AC
  Administered 2019-03-22: 15:00:00 via INTRAVENOUS
  Filled 2019-03-22: qty 250

## 2019-03-22 NOTE — Assessment & Plan Note (Signed)
Left breast cancer invasive ductal carcinoma 2.5 cm in size grade 1, ER 99%, PR 41%, Ki-67 11%, HER-2 negative T2 N0 M0 stage II a status post neoadjuvant chemotherapy followed by surgery February 2011and wason antiestrogen therapy with Femara from January 2011-July 2017  New onset mid back pain: MRI revealedT11 severe pathologic fracture, abnormal signal T10, T11 and T12, moderate canal stenosis at T11  12/02/2017: PET CT scan hypermetabolic lymphadenopathy in the thoracic and upper abdominal lymph nodes, spine and rib metastases, intramuscular metastases the left pectoralis muscle, T11 vertebral fracture pathologic  Biopsy of the T11 vertebral body ER 90%, PR 0%, Ki-67 10%, HER-2 -1+, foundation 1, PDL 1 Pathologic fracture T11: She met with orthopedics regarding kyphoplasty and they determined that there is no surgical options available for her. ------------------------------------------------------------------- Treatment : 1. Palliative radiation to T11 vertebral body and the pectoralis muscle: 12/20/17-12/26/17 2. Ibrance with letrozole along with Delton See or Zometadiscontinued July 2020 due to progression Patient could not tolerate Faslodex because of intense bone pain as well as hypotension. ---------------------------------------------------------------------------------------------------------------------- Current treatment: Halaven started 10/27/2018, today is cycle 8 (currently being given once every 3 weeks)  02/25/2019: PET CT scan: Hypermetabolic left axillary, left internal mammary and subcarinal lymph node and precordial lymph node.  Right lower lobe consolidation?  Infection, adrenal metastases, new focal lesions in the skeleton compared to PET scan done in August 2019.  Many do not have metabolic activity.  Large loculated left pleural effusion  We will plan to perform next scan in January.

## 2019-03-22 NOTE — Progress Notes (Signed)
Guardant 360 forum completed by patient and physician.  Lab to draw Guardant 360 labs to send off for testing.

## 2019-03-22 NOTE — Patient Instructions (Signed)
[facility] Discharge Instructions for Patients Receiving Chemotherapy  Today you received the following chemotherapy agents Eribulin mesylate (HALAVEN).  To help prevent nausea and vomiting after your treatment, we encourage you to take your nausea medication as prescribed.   If you develop nausea and vomiting that is not controlled by your nausea medication, call the clinic.   BELOW ARE SYMPTOMS THAT SHOULD BE REPORTED IMMEDIATELY:  *FEVER GREATER THAN 100.5 F  *CHILLS WITH OR WITHOUT FEVER  NAUSEA AND VOMITING THAT IS NOT CONTROLLED WITH YOUR NAUSEA MEDICATION  *UNUSUAL SHORTNESS OF BREATH  *UNUSUAL BRUISING OR BLEEDING  TENDERNESS IN MOUTH AND THROAT WITH OR WITHOUT PRESENCE OF ULCERS  *URINARY PROBLEMS  *BOWEL PROBLEMS  UNUSUAL RASH Items with * indicate a potential emergency and should be followed up as soon as possible.  Feel free to call the clinic should you have any questions or concerns. The clinic phone number is [phone].  Please show the [facility] at check-in to the Emergency Department and triage nurse.  Coronavirus (COVID-19) Are you at risk?  Are you at risk for the Coronavirus (COVID-19)?  To be considered HIGH RISK for Coronavirus (COVID-19), you have to meet the following criteria:  . Traveled to [place], [place], [place], [place] or [place]; or in the [place] to [place], [place], [place], or [place]; and have fever, cough, and shortness of breath within the last 2 weeks of travel OR . Been in close contact with a person diagnosed with COVID-19 within the last 2 weeks and have fever, cough, and shortness of breath . IF YOU DO NOT MEET THESE CRITERIA, YOU ARE CONSIDERED LOW RISK FOR COVID-19.  What to do if you are HIGH RISK for COVID-19?  Marland Kitchen If you are having a medical emergency, call 911. . Seek medical care right away. Before you go to a doctor's office, urgent care or emergency department, call ahead and tell  them about your recent travel, contact with someone diagnosed with COVID-19, and your symptoms. You should receive instructions from your physician's office regarding next steps of care.  . When you arrive at healthcare provider, tell the healthcare staff immediately you have returned from visiting [place], [place], [place], [place] or [place]; or traveled in the [place] to [place], [place], [place], or [place]; in the last two weeks or you have been in close contact with a person diagnosed with COVID-19 in the last 2 weeks.   . Tell the health care staff about your symptoms: fever, cough and shortness of breath. . After you have been seen by a medical provider, you will be either: o Tested for (COVID-19) and discharged home on quarantine except to seek medical care if symptoms worsen, and asked to  - Stay home and avoid contact with others until you get your results (4-5 days)  - Avoid travel on public transportation if possible (such as bus, train, or airplane) or o Sent to the Emergency Department by EMS for evaluation, COVID-19 testing, and possible admission depending on your condition and test results.  What to do if you are LOW RISK for COVID-19?  Reduce your risk of any infection by using the same precautions used for avoiding the common cold or flu:  Marland Kitchen Wash your hands often with soap and warm water for at least 20 seconds.  If soap and water are not readily available, use an alcohol-based hand sanitizer with at least 60% alcohol.  . If coughing  or sneezing, cover your mouth and nose by coughing or sneezing into the elbow areas of your shirt or coat, into a tissue or into your sleeve (not your hands). . Avoid shaking hands with others and consider head nods or verbal greetings only. . Avoid touching your eyes, nose, or mouth with unwashed hands.  . Avoid close contact with people who are sick. . Avoid places or events with large numbers of people in one location, like concerts or  sporting events. . Carefully consider travel plans you have or are making. . If you are planning any travel outside or inside the [place], visit the CDC's Travelers' Health webpage for the latest health notices. . If you have some symptoms but not all symptoms, continue to monitor at home and seek medical attention if your symptoms worsen. . If you are having a medical emergency, call 911.   [place] / e-Visit: [email or web address]         [facility]: [facility]: [phone]                   [facility]: [phone]

## 2019-04-11 NOTE — Progress Notes (Signed)
Patient Care Team: Redmon, Barth Kirks, PA-C as PCP - General (Nurse Practitioner)  DIAGNOSIS:    ICD-10-CM   1. Malignant neoplasm of lower-outer quadrant of left breast of female, estrogen receptor positive ([place])  C50.512    Z17.0     SUMMARY OF ONCOLOGIC HISTORY: Oncology History  Breast cancer of lower-outer quadrant of left female breast ([place])  12/13/2008 Initial Diagnosis   Left breast biopsy: Invasive ductal carcinoma ER 90% percent, PR 41%, Ki-67 11%, HER-2 negative ratio 1, Oncotype DX score 23, ROR15%   01/27/2009 - 03/10/2009 Neo-Adjuvant Chemotherapy   Neoadjuvant FEC 4; participant in the "bed sheets" study.   05/12/2009 -  Anti-estrogen oral therapy   Femara 2.5 mg daily   05/22/2009 Surgery   Bilateral mastectomy stage IIB T2 N0 M0 IDC left breast grade 1, 2.5 cm, ER 99%, PR 41%, Ki-67 11%, HER-2 negative   12/10/2009 Surgery   Breast reconstruction surgery   11/14/2017 Relapse/Recurrence   Mid back pain: MRI revealed T11 severe pathologic fracture, abnormal signal T10, T11 and T12, moderate canal stenosis at T11   12/03/2017 PET scan   Hypermetabolic metastatic breast cancer involving thoracic and upper abdominal lymph nodes, spine and ribs. There may be hypermetabolic adenopathy in the left neck; Hypermetabolic lymph node versus intramuscular metastasis involving the medial left pectoralis musculature Pathologic T11 fracture     12/11/2017 Procedure   Biopsy of T11 vertebral body lytic lesion: Metastatic breast adenocarcinoma ER positive, PR negative   12/19/2017 - 12/26/2017 Radiation Therapy   Palliative XRT to T 11   01/12/2018 - 10/26/2018 Anti-estrogen oral therapy   Patient could not tolerate Faslodex because of severe hypotension and severe pain in the legs after injections.  Ibrance with letrozole starting 01/12/2018   10/27/2018 -  Chemotherapy   Palliative chemotherapy with Halaven   Malignant neoplasm of lower-outer quadrant of left breast of female,  estrogen receptor positive ([place])  10/21/2018 Initial Diagnosis   Malignant neoplasm of lower-outer quadrant of left breast of female, estrogen receptor positive ([place])   10/27/2018 -  Chemotherapy   The patient had eriBULin mesylate (HALAVEN) 1.65 mg in sodium chloride 0.9 % 100 mL chemo infusion, 1.1 mg/m2 = 2.1 mg, Intravenous,  Once, 8 of 10 cycles Dose modification: 1.1 mg/m2 (original dose 1.4 mg/m2, Cycle 1, Reason: Other (see comments), Comment: CrCL < 50) Administration: 1.65 mg (10/27/2018), 1.65 mg (11/03/2018), 1.65 mg (11/17/2018), 1.65 mg (11/24/2018), 1.65 mg (12/08/2018), 1.65 mg (12/15/2018), 1.65 mg (12/29/2018), 1.65 mg (01/18/2019), 1.65 mg (02/08/2019), 1.65 mg (03/01/2019), 1.65 mg (03/22/2019)  for chemotherapy treatment.    11/17/2018 - 12/03/2018 Radiation Therapy   Palliative radiation to the T5 location of the spine and right femur, 35 Gy in 14 fractions   04/05/2019 Miscellaneous   Guardant 360:PIK3CA mutation (alpelisib), NF1 S2 467 (everolimus) ARID1A S1791 (response to neratinib, olaparib, rucaparib, talazoparib), T p53 and SMAD4 Q534, TMB 35.41 mutations /MB, no evidence of MSI high     CHIEF COMPLIANT: Follow-up of metastatic breast cancer on Halaven, cycle9day1  INTERVAL HISTORY: Elizabeth [name] is a 63 y.o. with above-mentioned history of metastatic breast cancerwhohas had recurrent pleural effusions.Sheis currently on treatment with Halaven.She presents to the clinic todayfor cycle9.  She is breathing better and appears to be able to function reasonably at home.  She does need oxygen once in a while.  She gets very exhausted with exertion.  Left chest wall pain is also much better.  REVIEW OF SYSTEMS:   Constitutional: Denies fevers,  chills or abnormal weight loss Eyes: Denies blurriness of vision Ears, nose, mouth, throat, and face: Denies mucositis or sore throat Respiratory: Denies cough, dyspnea or wheezes Cardiovascular: Denies palpitation, chest  discomfort Gastrointestinal: Denies nausea, heartburn or change in bowel habits Skin: Denies abnormal skin rashes Lymphatics: Denies new lymphadenopathy or easy bruising Neurological: Denies numbness, tingling or new weaknesses Behavioral/Psych: Mood is stable, no new changes  Extremities: No lower extremity edema Breast: denies any pain or lumps or nodules in either breasts All other systems were reviewed with the patient and are negative.  I have reviewed the past medical history, past surgical history, social history and family history with the patient and they are unchanged from previous note.  ALLERGIES:  is allergic to shellfish allergy and sulfa antibiotics.  MEDICATIONS:  Current Outpatient Medications  Medication Sig Dispense Refill  . Biotin 10000 MCG TABS Take 10,000 mcg by mouth daily.     . Cholecalciferol (VITAMIN D PO) Take 5,000 Units by mouth daily.     . DULoxetine (CYMBALTA) 30 MG capsule TAKE 1 CAPSULE (30 MG TOTAL) BY MOUTH 2 (TWO) TIMES DAILY. 180 capsule 2  . fentaNYL (DURAGESIC) 25 MCG/HR Place 1 patch onto the skin every 3 (three) days. 10 patch 0  . fluconazole (DIFLUCAN) 100 MG tablet Take 1 tablet (100 mg total) by mouth daily. 3 tablet 0  . gabapentin (NEURONTIN) 300 MG capsule Take 1 capsule (300 mg total) by mouth 2 (two) times daily. 180 capsule 2  . levothyroxine (SYNTHROID, LEVOTHROID) 25 MCG tablet Take 25 mcg by mouth daily before breakfast.     . lidocaine-prilocaine (EMLA) cream Apply to affected area once 30 g 3  . loperamide (IMODIUM) 2 MG capsule 1 to 2 PO QID prn diarrhea 30 capsule 22  . LORazepam (ATIVAN) 0.5 MG tablet Take 1 tablet (0.5 mg total) by mouth every 6 (six) hours as needed (Nausea or vomiting). 30 tablet 0  . magic mouthwash w/lidocaine SOLN Take 5 mLs by mouth 3 (three) times daily as needed for mouth pain. 100 mL 0  . Multiple Vitamin (MULTIVITAMIN) tablet Take 1 tablet by mouth daily.    Marland Kitchen omeprazole (PRILOSEC) 20 MG capsule  Take 1 capsule (20 mg total) by mouth daily. 90 capsule 0  . ondansetron (ZOFRAN) 8 MG tablet TAKE 1 TABLET (8 MG TOTAL) BY MOUTH 2 (TWO) TIMES DAILY AS NEEDED (NAUSEA OR VOMITING). 30 tablet 1  . oxyCODONE-acetaminophen (PERCOCET) 10-325 MG tablet Take 1 tablet by mouth every 8 (eight) hours as needed for pain. 90 tablet 0  . potassium chloride SA (KLOR-CON M20) 20 MEQ tablet Take 1 tablet (20 mEq total) by mouth daily. 90 tablet 0  . prednisoLONE acetate (PRED FORTE) 1 % ophthalmic suspension Place 1 drop into both eyes daily.    . prochlorperazine (COMPAZINE) 10 MG tablet Take 1 tablet (10 mg total) by mouth every 6 (six) hours as needed (Nausea or vomiting). 30 tablet 1  . prochlorperazine (COMPAZINE) 5 MG tablet Take 1 tablet (5 mg total) by mouth every 6 (six) hours as needed for nausea or vomiting. 45 tablet 2  . simethicone (GAS-X) 80 MG chewable tablet Chew 1 tablet (80 mg total) by mouth every 6 (six) hours as needed for flatulence. 120 tablet 2  . traMADol (ULTRAM) 50 MG tablet Take 1 tablet (50 mg total) by mouth every 6 (six) hours as needed. 60 tablet 0   No current facility-administered medications for this visit.    PHYSICAL EXAMINATION:  ECOG PERFORMANCE STATUS: 1 - Symptomatic but completely ambulatory  Vitals:   04/12/19 1443  BP: (!) 125/91  Pulse: (!) 110  Resp: 17  Temp: 98.3 F (36.8 C)  SpO2: 97%   Filed Weights   04/12/19 1443  Weight: 96 lb (43.5 kg)    GENERAL: alert, no distress and comfortable SKIN: skin color, texture, turgor are normal, no rashes or significant lesions EYES: normal, Conjunctiva are pink and non-injected, sclera clear OROPHARYNX: no exudate, no erythema and lips, buccal mucosa, and tongue normal  NECK: supple, thyroid normal size, non-tender, without nodularity LYMPH: no palpable lymphadenopathy in the cervical, axillary or inguinal LUNGS: clear to auscultation and percussion with normal breathing effort HEART: regular rate & rhythm  and no murmurs and no lower extremity edema ABDOMEN: abdomen soft, non-tender and normal bowel sounds MUSCULOSKELETAL: no cyanosis of digits and no clubbing  NEURO: alert & oriented x 3 with fluent speech, no focal motor/sensory deficits EXTREMITIES: No lower extremity edema  LABORATORY DATA:  I have reviewed the data as listed CMP Latest Ref Rng & Units 03/22/2019 03/01/2019 02/08/2019  Glucose 70 - 99 mg/dL 100(H) 87 107(H)  BUN 8 - 23 mg/dL _0 Creatinine 0.44 - 1.00 mg/dL 0.85 0.81 0.85  Sodium 135 - 145 mmol/L 138 140 137  Potassium 3.5 - 5.1 mmol/L 4.4 4.3 3.9  Chloride 98 - 111 mmol/L 102 103 102  CO2 22 - 32 mmol/L _1 Calcium 8.9 - 10.3 mg/dL 8.9 9.1 9.0  Total Protein 6.5 - 8.1 g/dL 6.8 6.6 6.5  Total Bilirubin 0.3 - 1.2 mg/dL 0.2(L) <0.2(L) 0.3  Alkaline Phos 38 - 126 U/L 159(H) 141(H) 139(H)  AST 15 - 41 U/L 34 21 23  ALT 0 - 44 U/L _2 Lab Results  Component Value Date   WBC 4.8 03/22/2019   HGB 11.1 (L) 03/22/2019   HCT 34.6 (L) 03/22/2019   MCV 92.5 03/22/2019   PLT 438 (H) 03/22/2019   NEUTROABS 3.2 03/22/2019    ASSESSMENT & PLAN:  Malignant neoplasm of lower-outer quadrant of left breast of female, estrogen receptor positive ([facility]) Left breast cancer invasive ductal carcinoma 2.5 cm in size grade 1, ER 99%, PR 41%, Ki-67 11%, HER-2 negative T2 N0 M0 stage II a status post neoadjuvant chemotherapy followed by surgery February 2011and wason antiestrogen therapy with Femara from January 2011-July 2017  New onset mid back pain: MRI revealedT11 severe pathologic fracture, abnormal signal T10, T11 and T12, moderate canal stenosis at T11  12/02/2017: PET CT scan hypermetabolic lymphadenopathy in the thoracic and upper abdominal lymph nodes, spine and rib metastases, intramuscular metastases the left pectoralis muscle, T11 vertebral fracture pathologic  Biopsy of the T11 vertebral body ER 90%, PR 0%, Ki-67 10%, HER-2 -1+, foundation 1, PDL  1 Pathologic fracture T11: She met with orthopedics regarding kyphoplasty and they determined that there is no surgical options available for her. ------------------------------------------------------------------- Treatment : 1. Palliative radiation to T11 vertebral body and the pectoralis muscle: 12/20/17-12/26/17 2. Ibrance with letrozole along with Delton See or Zometadiscontinued July 2020 due to progression Patient could not tolerate Faslodex because of intense bone pain as well as hypotension. ---------------------------------------------------------------------------------------------------------------------- Current treatment: Halaven started 10/27/2018, today is cycle 9 (currently being given once every 3 weeks)  02/25/2019: PET CT scan: Hypermetabolic left axillary, left internal mammary and subcarinal lymph node and precordial lymph node. Right lower lobe consolidation? Infection, adrenal metastases, new focal lesions in the skeleton  compared to PET scan done in August 2019. Many do not have metabolic activity. Large loculated left pleural effusion  04/05/2019: Guardant 360:PIK3CA mutation (alpelisib), NF1 S2 467 (everolimus) ARID1A S1791 (response to neratinib, olaparib, rucaparib, talazoparib), T p53 and SMAD4 Q534, TMB 35.41  Based on these results, it appears that she could respond to alpelisib as well as everolimus and olaparib as well as possibly immunotherapy. If there is progression on scans that will be done in January then we will switch her treatment.    No orders of the defined types were placed in this encounter.  The patient has a good understanding of the overall plan. she agrees with it. she will call with any problems that may develop before the next visit here.  Nicholas Lose, MD 04/12/2019  Julious Oka Dorshimer, am acting as scribe for Dr. Nicholas Lose.  I have reviewed the above document for accuracy and completeness, and I agree with the above.

## 2019-04-12 ENCOUNTER — Inpatient Hospital Stay: Payer: Medicare Other

## 2019-04-12 ENCOUNTER — Other Ambulatory Visit: Payer: Self-pay

## 2019-04-12 ENCOUNTER — Inpatient Hospital Stay (HOSPITAL_BASED_OUTPATIENT_CLINIC_OR_DEPARTMENT_OTHER): Payer: Medicare Other | Admitting: Hematology and Oncology

## 2019-04-12 VITALS — HR 96

## 2019-04-12 DIAGNOSIS — C50512 Malignant neoplasm of lower-outer quadrant of left female breast: Secondary | ICD-10-CM

## 2019-04-12 DIAGNOSIS — Z7189 Other specified counseling: Secondary | ICD-10-CM

## 2019-04-12 DIAGNOSIS — Z5111 Encounter for antineoplastic chemotherapy: Secondary | ICD-10-CM | POA: Diagnosis not present

## 2019-04-12 DIAGNOSIS — Z170 Estrogen receptor positive status [ER+]: Secondary | ICD-10-CM

## 2019-04-12 LAB — CBC WITH DIFFERENTIAL (CANCER CENTER ONLY)
Abs Immature Granulocytes: 0.01 10*3/uL (ref 0.00–0.07)
Basophils Absolute: 0 10*3/uL (ref 0.0–0.1)
Basophils Relative: 1 %
Eosinophils Absolute: 0 10*3/uL (ref 0.0–0.5)
Eosinophils Relative: 1 %
HCT: 35.4 % — ABNORMAL LOW (ref 36.0–46.0)
Hemoglobin: 11.2 g/dL — ABNORMAL LOW (ref 12.0–15.0)
Immature Granulocytes: 0 %
Lymphocytes Relative: 17 %
Lymphs Abs: 0.8 10*3/uL (ref 0.7–4.0)
MCH: 29.9 pg (ref 26.0–34.0)
MCHC: 31.6 g/dL (ref 30.0–36.0)
MCV: 94.7 fL (ref 80.0–100.0)
Monocytes Absolute: 0.6 10*3/uL (ref 0.1–1.0)
Monocytes Relative: 14 %
Neutro Abs: 3 10*3/uL (ref 1.7–7.7)
Neutrophils Relative %: 67 %
Platelet Count: 522 10*3/uL — ABNORMAL HIGH (ref 150–400)
RBC: 3.74 MIL/uL — ABNORMAL LOW (ref 3.87–5.11)
RDW: 16.6 % — ABNORMAL HIGH (ref 11.5–15.5)
WBC Count: 4.5 10*3/uL (ref 4.0–10.5)
nRBC: 0 % (ref 0.0–0.2)

## 2019-04-12 LAB — CMP (CANCER CENTER ONLY)
ALT: 17 U/L (ref 0–44)
AST: 29 U/L (ref 15–41)
Albumin: 3.3 g/dL — ABNORMAL LOW (ref 3.5–5.0)
Alkaline Phosphatase: 141 U/L — ABNORMAL HIGH (ref 38–126)
Anion gap: 9 (ref 5–15)
BUN: 17 mg/dL (ref 8–23)
CO2: 26 mmol/L (ref 22–32)
Calcium: 9.1 mg/dL (ref 8.9–10.3)
Chloride: 104 mmol/L (ref 98–111)
Creatinine: 0.83 mg/dL (ref 0.44–1.00)
GFR, Est AFR Am: >60 mL/min (ref >60)
GFR, Estimated: >60 mL/min (ref >60)
Glucose, Bld: 94 mg/dL (ref 70–99)
Potassium: 4.2 mmol/L (ref 3.5–5.1)
Sodium: 139 mmol/L (ref 135–145)
Total Bilirubin: 0.2 mg/dL — ABNORMAL LOW (ref 0.3–1.2)
Total Protein: 6.9 g/dL (ref 6.5–8.1)

## 2019-04-12 MED ORDER — SODIUM CHLORIDE 0.9% FLUSH
10.0000 mL | INTRAVENOUS | Status: DC | PRN
  Administered 2019-04-12: 10 mL
  Filled 2019-04-12: qty 10

## 2019-04-12 MED ORDER — HEPARIN SOD (PORK) LOCK FLUSH 100 UNIT/ML IV SOLN
500.0000 [IU] | Freq: Once | INTRAVENOUS | Status: AC | PRN
  Administered 2019-04-12: 500 [IU]
  Filled 2019-04-12: qty 5

## 2019-04-12 MED ORDER — PROCHLORPERAZINE MALEATE 10 MG PO TABS
10.0000 mg | ORAL_TABLET | Freq: Once | ORAL | Status: AC
  Administered 2019-04-12: 10 mg via ORAL

## 2019-04-12 MED ORDER — SODIUM CHLORIDE 0.9 % IV SOLN
Freq: Once | INTRAVENOUS | Status: AC
  Filled 2019-04-12: qty 250

## 2019-04-12 MED ORDER — SODIUM CHLORIDE 0.9% FLUSH
10.0000 mL | Freq: Once | INTRAVENOUS | Status: AC | PRN
  Administered 2019-04-12: 10 mL
  Filled 2019-04-12: qty 10

## 2019-04-12 MED ORDER — SODIUM CHLORIDE 0.9 % IV SOLN
1.1000 mg/m2 | Freq: Once | INTRAVENOUS | Status: AC
  Administered 2019-04-12: 1.65 mg via INTRAVENOUS
  Filled 2019-04-12: qty 3.3

## 2019-04-12 MED ORDER — PROCHLORPERAZINE MALEATE 10 MG PO TABS
ORAL_TABLET | ORAL | Status: AC
  Filled 2019-04-12: qty 1

## 2019-04-12 NOTE — Patient Instructions (Signed)
[facility] Discharge Instructions for Patients Receiving Chemotherapy  Today you received the following chemotherapy agents Halaven  To help prevent nausea and vomiting after your treatment, we encourage you to take your nausea medication as directed If you develop nausea and vomiting that is not controlled by your nausea medication, call the clinic.   BELOW ARE SYMPTOMS THAT SHOULD BE REPORTED IMMEDIATELY:  *FEVER GREATER THAN 100.5 F  *CHILLS WITH OR WITHOUT FEVER  NAUSEA AND VOMITING THAT IS NOT CONTROLLED WITH YOUR NAUSEA MEDICATION  *UNUSUAL SHORTNESS OF BREATH  *UNUSUAL BRUISING OR BLEEDING  TENDERNESS IN MOUTH AND THROAT WITH OR WITHOUT PRESENCE OF ULCERS  *URINARY PROBLEMS  *BOWEL PROBLEMS  UNUSUAL RASH Items with * indicate a potential emergency and should be followed up as soon as possible.  Feel free to call the clinic should you have any questions or concerns. The clinic phone number is [phone].  Please show the CHEMO ALERT CARD at check-in to the Emergency Department and triage nurse.   

## 2019-04-12 NOTE — Assessment & Plan Note (Signed)
Left breast cancer invasive ductal carcinoma 2.5 cm in size grade 1, ER 99%, PR 41%, Ki-67 11%, HER-2 negative T2 N0 M0 stage II a status post neoadjuvant chemotherapy followed by surgery February 2011and wason antiestrogen therapy with Femara from January 2011-July 2017  New onset mid back pain: MRI revealedT11 severe pathologic fracture, abnormal signal T10, T11 and T12, moderate canal stenosis at T11  12/02/2017: PET CT scan hypermetabolic lymphadenopathy in the thoracic and upper abdominal lymph nodes, spine and rib metastases, intramuscular metastases the left pectoralis muscle, T11 vertebral fracture pathologic  Biopsy of the T11 vertebral body ER 90%, PR 0%, Ki-67 10%, HER-2 -1+, foundation 1, PDL 1 Pathologic fracture T11: She met with orthopedics regarding kyphoplasty and they determined that there is no surgical options available for her. ------------------------------------------------------------------- Treatment : 1. Palliative radiation to T11 vertebral body and the pectoralis muscle: 12/20/17-12/26/17 2. Ibrance with letrozole along with Delton See or Zometadiscontinued July 2020 due to progression Patient could not tolerate Faslodex because of intense bone pain as well as hypotension. ---------------------------------------------------------------------------------------------------------------------- Current treatment: Halaven started 10/27/2018, today is cycle 9 (currently being given once every 3 weeks)  02/25/2019: PET CT scan: Hypermetabolic left axillary, left internal mammary and subcarinal lymph node and precordial lymph node. Right lower lobe consolidation? Infection, adrenal metastases, new focal lesions in the skeleton compared to PET scan done in August 2019. Many do not have metabolic activity. Large loculated left pleural effusion  04/05/2019: Guardant 360:PIK3CA mutation (alpelisib), NF1 S2 467 (everolimus) ARID1A S1791 (response to neratinib, olaparib,  rucaparib, talazoparib), T p53 and SMAD4 Q534, TMB 35.41  Based on these results, it appears that she could respond to alpelisib as well as everolimus and olaparib as well as possibly immunotherapy. If there is progression on scans that will be done in January then we will switch her treatment.

## 2019-04-12 NOTE — Patient Instructions (Signed)

## 2019-04-14 ENCOUNTER — Telehealth: Payer: Self-pay | Admitting: Hematology and Oncology

## 2019-04-14 NOTE — Telephone Encounter (Signed)
No new LOS during time of check out.

## 2019-04-20 ENCOUNTER — Other Ambulatory Visit: Payer: Self-pay | Admitting: Hematology and Oncology

## 2019-04-20 ENCOUNTER — Telehealth: Payer: Self-pay

## 2019-04-20 LAB — GUARDANT 360

## 2019-04-20 MED ORDER — FENTANYL 25 MCG/HR TD PT72: 1.0000 | MEDICATED_PATCH | TRANSDERMAL | 0 refills | Status: DC

## 2019-04-20 NOTE — Telephone Encounter (Signed)
Per MD recommendations will review side effects at upcoming appointment.  RN left message updating patient.

## 2019-04-20 NOTE — Telephone Encounter (Signed)
Pt called to request refill on Fentanyl, will have MD review for approval.    Pt reports experiencing numbness in bilateral feet, with reports of stinging sensations at times.  Right foot worse than left.  Small amount of numbness in bilateral hands, pt still able to pick up objects, does have sensation in hands.  Will have MD review for further recommendations.

## 2019-04-30 ENCOUNTER — Other Ambulatory Visit: Payer: Self-pay

## 2019-04-30 ENCOUNTER — Ambulatory Visit (HOSPITAL_COMMUNITY)
Admission: RE | Admit: 2019-04-30 | Discharge: 2019-04-30 | Disposition: A | Discharge status: Home / Self Care | Payer: Medicare Other | Source: Ambulatory Visit | Attending: Hematology and Oncology | Admitting: Hematology and Oncology

## 2019-04-30 ENCOUNTER — Encounter (HOSPITAL_COMMUNITY): Payer: Self-pay

## 2019-04-30 DIAGNOSIS — Z170 Estrogen receptor positive status [ER+]: Secondary | ICD-10-CM | POA: Insufficient documentation

## 2019-04-30 DIAGNOSIS — C50512 Malignant neoplasm of lower-outer quadrant of left female breast: Secondary | ICD-10-CM

## 2019-04-30 MED ORDER — IOHEXOL 300 MG/ML  SOLN
100.0000 mL | Freq: Once | INTRAMUSCULAR | Status: AC | PRN
  Administered 2019-04-30: 100 mL via INTRAVENOUS

## 2019-04-30 MED ORDER — SODIUM CHLORIDE (PF) 0.9 % IJ SOLN
INTRAMUSCULAR | Status: AC
  Filled 2019-04-30: qty 50

## 2019-05-02 NOTE — Progress Notes (Signed)
Patient Care Team: [name] as PCP - General (Nurse Practitioner)  DIAGNOSIS:    ICD-10-CM   1. Bone metastasis ([name])  C79.51   2. Malignant neoplasm of lower-outer quadrant of left breast of female, estrogen receptor positive ([name])  C50.512    Z17.0     SUMMARY OF ONCOLOGIC HISTORY: Oncology History  Breast cancer of lower-outer quadrant of left female breast ([name])  12/13/2008 Initial Diagnosis   Left breast biopsy: Invasive ductal carcinoma ER 90% percent, PR 41%, Ki-67 11%, HER-2 negative ratio 1, Oncotype DX score 23, ROR15%   01/27/2009 - 03/10/2009 Neo-Adjuvant Chemotherapy   Neoadjuvant FEC 4; participant in the "bed sheets" study.   05/12/2009 -  Anti-estrogen oral therapy   Femara 2.5 mg daily   05/22/2009 Surgery   Bilateral mastectomy stage IIB T2 N0 M0 IDC left breast grade 1, 2.5 cm, ER 99%, PR 41%, Ki-67 11%, HER-2 negative   12/10/2009 Surgery   Breast reconstruction surgery   11/14/2017 Relapse/Recurrence   Mid back pain: MRI revealed T11 severe pathologic fracture, abnormal signal T10, T11 and T12, moderate canal stenosis at T11   12/03/2017 PET scan   Hypermetabolic metastatic breast cancer involving thoracic and upper abdominal lymph nodes, spine and ribs. There may be hypermetabolic adenopathy in the left neck; Hypermetabolic lymph node versus intramuscular metastasis involving the medial left pectoralis musculature Pathologic T11 fracture     12/11/2017 Procedure   Biopsy of T11 vertebral body lytic lesion: Metastatic breast adenocarcinoma ER positive, PR negative   12/19/2017 - 12/26/2017 Radiation Therapy   Palliative XRT to T 11   01/12/2018 - 10/26/2018 Anti-estrogen oral therapy   Patient could not tolerate Faslodex because of severe hypotension and severe pain in the legs after injections.  Ibrance with letrozole starting 01/12/2018   10/27/2018 -  Chemotherapy   Palliative chemotherapy with Halaven   Malignant neoplasm of lower-outer  quadrant of left breast of female, estrogen receptor positive ([name])  10/21/2018 Initial Diagnosis   Malignant neoplasm of lower-outer quadrant of left breast of female, estrogen receptor positive ([name])   10/27/2018 -  Chemotherapy   The patient had eriBULin mesylate (HALAVEN) 1.65 mg in sodium chloride 0.9 % 100 mL chemo infusion, 1.1 mg/m2 = 2.1 mg, Intravenous,  Once, 9 of 10 cycles Dose modification: 1.1 mg/m2 (original dose 1.4 mg/m2, Cycle 1, Reason: Other (see comments), Comment: CrCL < 50) Administration: 1.65 mg (10/27/2018), 1.65 mg (11/03/2018), 1.65 mg (11/17/2018), 1.65 mg (11/24/2018), 1.65 mg (12/08/2018), 1.65 mg (12/15/2018), 1.65 mg (12/29/2018), 1.65 mg (01/18/2019), 1.65 mg (02/08/2019), 1.65 mg (03/01/2019), 1.65 mg (03/22/2019), 1.65 mg (04/12/2019)  for chemotherapy treatment.    11/17/2018 - 12/03/2018 Radiation Therapy   Palliative radiation to the T5 location of the spine and right femur, 35 Gy in 14 fractions   04/05/2019 Miscellaneous   Guardant 360:PIK3CA mutation (alpelisib), NF1 S2 467 (everolimus) ARID1A S1791 (response to neratinib, olaparib, rucaparib, talazoparib), T p53 and SMAD4 Q534, TMB 35.41 mutations /MB, no evidence of MSI high     CHIEF COMPLIANT: Follow-up of metastatic breast cancer to review scans  INTERVAL HISTORY: Elizabeth [name] is a 64 y.o. with above-mentioned history of metastatic breast cancerwhohas had recurrent pleural effusions.Sheis currently on treatment with Halaven.CT CAP on 04/30/19 showed an increased left pleural effusion, increase in a pericardial nodule, 1.4cm, and left axillary lymph nodes, 1.4cm, slight enlargement of left retroperitoneal lymphadenopathy adjacent to the left adrenal gland, 1.5cm, and stable osseous metastatic disease. Elizabeth [name] presents to the clinic  todayto review her scan and discuss further treatment.  Overall Elizabeth [name] appears to be doing fairly well.  Elizabeth [name] is able to walk without assistance.  Elizabeth [name] has some shortness of breath to  exertion.  ALLERGIES:  is allergic to shellfish allergy and sulfa antibiotics.  MEDICATIONS:  Current Outpatient Medications  Medication Sig Dispense Refill  . Biotin 10000 MCG TABS Take 10,000 mcg by mouth daily.     . Cholecalciferol (VITAMIN D PO) Take 5,000 Units by mouth daily.     . DULoxetine (CYMBALTA) 30 MG capsule TAKE 1 CAPSULE (30 MG TOTAL) BY MOUTH 2 (TWO) TIMES DAILY. 180 capsule 2  . fentaNYL (DURAGESIC) 25 MCG/HR Place 1 patch onto the skin every 3 (three) days. 10 patch 0  . fluconazole (DIFLUCAN) 100 MG tablet Take 1 tablet (100 mg total) by mouth daily. 3 tablet 0  . gabapentin (NEURONTIN) 300 MG capsule Take 1 capsule (300 mg total) by mouth 2 (two) times daily. 180 capsule 2  . levothyroxine (SYNTHROID, LEVOTHROID) 25 MCG tablet Take 25 mcg by mouth daily before breakfast.     . lidocaine-prilocaine (EMLA) cream Apply to affected area once 30 g 3  . loperamide (IMODIUM) 2 MG capsule 1 to 2 PO QID prn diarrhea 30 capsule 22  . LORazepam (ATIVAN) 0.5 MG tablet Take 1 tablet (0.5 mg total) by mouth every 6 (six) hours as needed (Nausea or vomiting). 30 tablet 0  . magic mouthwash w/lidocaine SOLN Take 5 mLs by mouth 3 (three) times daily as needed for mouth pain. 100 mL 0  . Multiple Vitamin (MULTIVITAMIN) tablet Take 1 tablet by mouth daily.    Marland Kitchen omeprazole (PRILOSEC) 20 MG capsule Take 1 capsule (20 mg total) by mouth daily. 90 capsule 0  . ondansetron (ZOFRAN) 8 MG tablet TAKE 1 TABLET (8 MG TOTAL) BY MOUTH 2 (TWO) TIMES DAILY AS NEEDED (NAUSEA OR VOMITING). 30 tablet 1  . oxyCODONE-acetaminophen (PERCOCET) 10-325 MG tablet Take 1 tablet by mouth every 8 (eight) hours as needed for pain. 90 tablet 0  . potassium chloride SA (KLOR-CON M20) 20 MEQ tablet Take 1 tablet (20 mEq total) by mouth daily. 90 tablet 0  . prednisoLONE acetate (PRED FORTE) 1 % ophthalmic suspension Place 1 drop into both eyes daily.    . prochlorperazine (COMPAZINE) 10 MG tablet Take 1 tablet (10 mg  total) by mouth every 6 (six) hours as needed (Nausea or vomiting). 30 tablet 1  . prochlorperazine (COMPAZINE) 5 MG tablet Take 1 tablet (5 mg total) by mouth every 6 (six) hours as needed for nausea or vomiting. 45 tablet 2  . simethicone (GAS-X) 80 MG chewable tablet Chew 1 tablet (80 mg total) by mouth every 6 (six) hours as needed for flatulence. 120 tablet 2  . traMADol (ULTRAM) 50 MG tablet Take 1 tablet (50 mg total) by mouth every 6 (six) hours as needed. 60 tablet 0   No current facility-administered medications for this visit.    PHYSICAL EXAMINATION: ECOG PERFORMANCE STATUS: 1 - Symptomatic but completely ambulatory Lungs: Diminished breath sounds in the left lung  Vitals:   05/03/19 1134  BP: (!) 133/92  Pulse: (!) 101  Resp: 17  Temp: 98.3 F (36.8 C)  SpO2: 93%   Filed Weights   05/03/19 1134  Weight: 96 lb 11.2 oz (43.9 kg)    LABORATORY DATA:  I have reviewed the data as listed CMP Latest Ref Rng & Units 04/12/2019 03/22/2019 03/01/2019  Glucose 70 - 99 mg/dL  94 100(H) 87  BUN 8 - 23 mg/dL '17 17 13  ' Creatinine 0.44 - 1.00 mg/dL 0.83 0.85 0.81  Sodium 135 - 145 mmol/L 139 138 140  Potassium 3.5 - 5.1 mmol/L 4.2 4.4 4.3  Chloride 98 - 111 mmol/L 104 102 103  CO2 22 - 32 mmol/L '26 29 27  ' Calcium 8.9 - 10.3 mg/dL 9.1 8.9 9.1  Total Protein 6.5 - 8.1 g/dL 6.9 6.8 6.6  Total Bilirubin 0.3 - 1.2 mg/dL 0.2(L) 0.2(L) <0.2(L)  Alkaline Phos 38 - 126 U/L 141(H) 159(H) 141(H)  AST 15 - 41 U/L 29 34 21  ALT 0 - 44 U/L '17 24 14    ' Lab Results  Component Value Date   WBC 5.0 05/03/2019   HGB 10.7 (L) 05/03/2019   HCT 33.2 (L) 05/03/2019   MCV 93.5 05/03/2019   PLT 439 (H) 05/03/2019   NEUTROABS 3.7 05/03/2019    ASSESSMENT & PLAN:  Breast cancer of lower-outer quadrant of left female breast Left breast cancer invasive ductal carcinoma 2.5 cm in size grade 1, ER 99%, PR 41%, Ki-67 11%, HER-2 negative T2 N0 M0 stage II a status post neoadjuvant chemotherapy  followed by surgery February 2011and wason antiestrogen therapy with Femara from January 2011-July 2017  New onset mid back pain: MRI revealedT11 severe pathologic fracture, abnormal signal T10, T11 and T12, moderate canal stenosis at T11  12/02/2017: PET CT scan hypermetabolic lymphadenopathy in the thoracic and upper abdominal lymph nodes, spine and rib metastases, intramuscular metastases the left pectoralis muscle, T11 vertebral fracture pathologic  Biopsy of the T11 vertebral body ER 90%, PR 0%, Ki-67 10%, HER-2 -1+, foundation 1, PDL 1 Pathologic fracture T11: Elizabeth [name] met with orthopedics regarding kyphoplasty and they determined that there is no surgical options available for her. ------------------------------------------------------------------- Treatment : 1. Palliative radiation to T11 vertebral body and the pectoralis muscle: 12/20/17-12/26/17 2. Ibrance with letrozole along with Delton See or Zometadiscontinued July 2020 due to progression Patient could not tolerate Faslodex because of intense bone pain as well as hypotension. ---------------------------------------------------------------------------------------------------------------------- Current treatment: Halaven started 10/27/2018, completed 9 cycles as of 04/12/2019 discontinued for progression of disease.  Switching treatment to alpelisib  02/25/2019: PET CT scan: Hypermetabolic left axillary, left internal mammary and subcarinal lymph node and precordial lymph node. Right lower lobe consolidation? Infection, adrenal metastases, new focal lesions in the skeleton compared to PET scan done in August 2019. Many do not have metabolic activity. Large loculated left pleural effusion  04/05/2019: Guardant 360:PIK3CA mutation (alpelisib), NF1 S2 467 (everolimus) ARID1A S1791 (response to neratinib, olaparib, rucaparib, talazoparib), T p53 and SMAD4 Q534, TMB 35.41 Based on these results, it appears that Elizabeth [name] could respond to  alpelisib as well as everolimus and olaparib as well as possibly immunotherapy.  CT CAP 04/30/2019: There is increased now nearly confluent left-sided pleural nodularity, increase in size of pericardial nodule 1.4 cm, enlargement of left axillary lymph nodes 1.4 cm, enlargement of left retroperitoneal lymph node 1.5 cm, no significant change in the sclerotic bone metastases.  Radiation fibrosis in the lungs, loculated left pleural effusion with pleural thickening and nodularity.  Recommendation: Alpelisib with Faslodex Alpelisib (Piqray) Counseling: Alpelisib was studied in SOLAR-1 clinical trial: 160 patients postmenopausal woman who progressed on hormonal therapy were randomized to Fulvestrant + Alpelisib vs Fulvestrant. PFS was 11 months versus 5.7 months in patients who had PIK3CA mutation. Common side effects of Piqray are high blood sugar levels, increase in creatinine, diarrhea, rash, decrease in lymphocyte count, elevated liver enzymes,  nausea, fatigue, anemia, increase in lipase, decreased appetite, stomatitis, vomiting, weight loss, low calcium levels, aPTT prolonged, and hair loss.  Previously Elizabeth [name] could not tolerate Faslodex.  Instead of 500 mg we will administer 250 mg and see if Elizabeth [name] tolerates it better. We will premedicate her with Zofran and pain medication like Ultram or Percocet  Return to clinic in 2 weeks for toxicity check and follow-up.   No orders of the defined types were placed in this encounter.  The patient has a good understanding of the overall plan. Elizabeth [name] agrees with it. Elizabeth [name] will call with any problems that may develop before the next visit here.  Total time spent: 30 mins including face to face time and time spent for planning, charting and coordination of care  Nicholas Lose, MD 05/03/2019  I, Cloyde Reams Dorshimer, am acting as scribe for Dr. Nicholas Lose.  I have reviewed the above documentation for accuracy and completeness, and I agree with the above.

## 2019-05-03 ENCOUNTER — Inpatient Hospital Stay (HOSPITAL_BASED_OUTPATIENT_CLINIC_OR_DEPARTMENT_OTHER): Payer: Medicare Other | Admitting: Hematology and Oncology

## 2019-05-03 ENCOUNTER — Telehealth: Payer: Self-pay | Admitting: Hematology and Oncology

## 2019-05-03 ENCOUNTER — Inpatient Hospital Stay: Payer: Medicare Other | Attending: Hematology and Oncology

## 2019-05-03 ENCOUNTER — Inpatient Hospital Stay: Payer: Medicare Other

## 2019-05-03 ENCOUNTER — Other Ambulatory Visit: Payer: Self-pay

## 2019-05-03 VITALS — BP 133/92 | HR 101 | Temp 98.3°F | Resp 17 | Ht 63.0 in | Wt 96.7 lb

## 2019-05-03 DIAGNOSIS — Z923 Personal history of irradiation: Secondary | ICD-10-CM | POA: Insufficient documentation

## 2019-05-03 DIAGNOSIS — Z79899 Other long term (current) drug therapy: Secondary | ICD-10-CM | POA: Insufficient documentation

## 2019-05-03 DIAGNOSIS — C7951 Secondary malignant neoplasm of bone: Secondary | ICD-10-CM

## 2019-05-03 DIAGNOSIS — Z79818 Long term (current) use of other agents affecting estrogen receptors and estrogen levels: Secondary | ICD-10-CM | POA: Insufficient documentation

## 2019-05-03 DIAGNOSIS — C50512 Malignant neoplasm of lower-outer quadrant of left female breast: Secondary | ICD-10-CM

## 2019-05-03 DIAGNOSIS — Z0000 Encounter for general adult medical examination without abnormal findings: Secondary | ICD-10-CM | POA: Diagnosis not present

## 2019-05-03 DIAGNOSIS — C7970 Secondary malignant neoplasm of unspecified adrenal gland: Secondary | ICD-10-CM | POA: Diagnosis not present

## 2019-05-03 DIAGNOSIS — Z170 Estrogen receptor positive status [ER+]: Secondary | ICD-10-CM

## 2019-05-03 DIAGNOSIS — Z9221 Personal history of antineoplastic chemotherapy: Secondary | ICD-10-CM | POA: Insufficient documentation

## 2019-05-03 DIAGNOSIS — R739 Hyperglycemia, unspecified: Secondary | ICD-10-CM

## 2019-05-03 DIAGNOSIS — C778 Secondary and unspecified malignant neoplasm of lymph nodes of multiple regions: Secondary | ICD-10-CM | POA: Insufficient documentation

## 2019-05-03 DIAGNOSIS — Z7189 Other specified counseling: Secondary | ICD-10-CM

## 2019-05-03 LAB — CMP (CANCER CENTER ONLY)
ALT: 15 U/L (ref 0–44)
AST: 32 U/L (ref 15–41)
Albumin: 3.3 g/dL — ABNORMAL LOW (ref 3.5–5.0)
Alkaline Phosphatase: 143 U/L — ABNORMAL HIGH (ref 38–126)
Anion gap: 8 (ref 5–15)
BUN: 13 mg/dL (ref 8–23)
CO2: 28 mmol/L (ref 22–32)
Calcium: 8.8 mg/dL — ABNORMAL LOW (ref 8.9–10.3)
Chloride: 104 mmol/L (ref 98–111)
Creatinine: 0.84 mg/dL (ref 0.44–1.00)
GFR, Est AFR Am: >60 mL/min (ref >60)
GFR, Estimated: >60 mL/min (ref >60)
Glucose, Bld: 102 mg/dL — ABNORMAL HIGH (ref 70–99)
Potassium: 4.1 mmol/L (ref 3.5–5.1)
Sodium: 140 mmol/L (ref 135–145)
Total Bilirubin: 0.2 mg/dL — ABNORMAL LOW (ref 0.3–1.2)
Total Protein: 6.7 g/dL (ref 6.5–8.1)

## 2019-05-03 LAB — CBC WITH DIFFERENTIAL (CANCER CENTER ONLY)
Abs Immature Granulocytes: 0.02 10*3/uL (ref 0.00–0.07)
Basophils Absolute: 0.1 10*3/uL (ref 0.0–0.1)
Basophils Relative: 1 %
Eosinophils Absolute: 0.1 10*3/uL (ref 0.0–0.5)
Eosinophils Relative: 1 %
HCT: 33.2 % — ABNORMAL LOW (ref 36.0–46.0)
Hemoglobin: 10.7 g/dL — ABNORMAL LOW (ref 12.0–15.0)
Immature Granulocytes: 0 %
Lymphocytes Relative: 12 %
Lymphs Abs: 0.6 10*3/uL — ABNORMAL LOW (ref 0.7–4.0)
MCH: 30.1 pg (ref 26.0–34.0)
MCHC: 32.2 g/dL (ref 30.0–36.0)
MCV: 93.5 fL (ref 80.0–100.0)
Monocytes Absolute: 0.6 10*3/uL (ref 0.1–1.0)
Monocytes Relative: 11 %
Neutro Abs: 3.7 10*3/uL (ref 1.7–7.7)
Neutrophils Relative %: 75 %
Platelet Count: 439 10*3/uL — ABNORMAL HIGH (ref 150–400)
RBC: 3.55 MIL/uL — ABNORMAL LOW (ref 3.87–5.11)
RDW: 15.6 % — ABNORMAL HIGH (ref 11.5–15.5)
WBC Count: 5 10*3/uL (ref 4.0–10.5)
nRBC: 0 % (ref 0.0–0.2)

## 2019-05-03 MED ORDER — SODIUM CHLORIDE 0.9% FLUSH
10.0000 mL | Freq: Once | INTRAVENOUS | Status: AC | PRN
  Administered 2019-05-03: 10 mL
  Filled 2019-05-03: qty 10

## 2019-05-03 MED ORDER — SODIUM CHLORIDE 0.9 % IV SOLN
Freq: Once | INTRAVENOUS | Status: AC
  Filled 2019-05-03: qty 250

## 2019-05-03 MED ORDER — ONDANSETRON HCL 8 MG PO TABS
ORAL_TABLET | ORAL | Status: AC
  Filled 2019-05-03: qty 1

## 2019-05-03 MED ORDER — ALPELISIB (300 MG DAILY DOSE) 2 X 150 MG PO TBPK: ORAL_TABLET | ORAL | 3 refills | Status: DC

## 2019-05-03 MED ORDER — ONDANSETRON HCL 8 MG PO TABS
8.0000 mg | ORAL_TABLET | Freq: Once | ORAL | Status: AC
  Administered 2019-05-03: 8 mg via ORAL

## 2019-05-03 MED ORDER — TRAMADOL HCL 50 MG PO TABS: 50.0000 mg | ORAL_TABLET | Freq: Four times a day (QID) | ORAL | 0 refills | Status: DC | PRN

## 2019-05-03 MED ORDER — LETROZOLE 2.5 MG PO TABS: 2.5000 mg | ORAL_TABLET | Freq: Every day | ORAL | 3 refills | Status: AC

## 2019-05-03 MED ORDER — TRAMADOL HCL 50 MG PO TABS
50.0000 mg | ORAL_TABLET | Freq: Once | ORAL | Status: AC
  Administered 2019-05-03: 50 mg via ORAL

## 2019-05-03 MED ORDER — FULVESTRANT 250 MG/5ML IM SOLN
250.0000 mg | Freq: Once | INTRAMUSCULAR | Status: AC
  Administered 2019-05-03: 14:00:00 250 mg via INTRAMUSCULAR

## 2019-05-03 MED ORDER — TRAMADOL HCL 50 MG PO TABS
ORAL_TABLET | ORAL | Status: AC
  Filled 2019-05-03: qty 1

## 2019-05-03 MED ORDER — HEPARIN SOD (PORK) LOCK FLUSH 100 UNIT/ML IV SOLN
500.0000 [IU] | Freq: Once | INTRAVENOUS | Status: AC | PRN
  Administered 2019-05-03: 500 [IU]
  Filled 2019-05-03: qty 5

## 2019-05-03 MED ORDER — FULVESTRANT 250 MG/5ML IM SOLN
INTRAMUSCULAR | Status: AC
  Filled 2019-05-03: qty 5

## 2019-05-03 MED ORDER — ZOLEDRONIC ACID 4 MG/5ML IV CONC
3.0000 mg | Freq: Once | INTRAVENOUS | Status: AC
  Administered 2019-05-03: 3 mg via INTRAVENOUS
  Filled 2019-05-03: qty 3.75

## 2019-05-03 NOTE — Patient Instructions (Signed)
Fulvestrant injection What is this medicine? FULVESTRANT (ful VES trant) blocks the effects of estrogen. It is used to treat breast cancer. This medicine may be used for other purposes; ask your health care provider or pharmacist if you have questions. COMMON BRAND NAME(S): FASLODEX What should I tell my health care provider before I take this medicine? They need to know if you have any of these conditions:  bleeding disorders  liver disease  low blood counts, like low white cell, platelet, or red cell counts  an unusual or allergic reaction to fulvestrant, other medicines, foods, dyes, or preservatives  pregnant or trying to get pregnant  breast-feeding How should I use this medicine? This medicine is for injection into a muscle. It is usually given by a health care professional in a hospital or clinic setting. Talk to your pediatrician regarding the use of this medicine in children. Special care may be needed. Overdosage: If you think you have taken too much of this medicine contact a poison control center or emergency room at once. NOTE: This medicine is only for you. Do not share this medicine with others. What if I miss a dose? It is important not to miss your dose. Call your doctor or health care professional if you are unable to keep an appointment. What may interact with this medicine?  medicines that treat or prevent blood clots like warfarin, enoxaparin, dalteparin, apixaban, dabigatran, and rivaroxaban This list may not describe all possible interactions. Give your health care provider a list of all the medicines, herbs, non-prescription drugs, or dietary supplements you use. Also tell them if you smoke, drink alcohol, or use illegal drugs. Some items may interact with your medicine. What should I watch for while using this medicine? Your condition will be monitored carefully while you are receiving this medicine. You will need important blood work done while you are taking  this medicine. Do not become pregnant while taking this medicine or for at least 1 year after stopping it. Women of child-bearing potential will need to have a negative pregnancy test before starting this medicine. Women should inform their doctor if they wish to become pregnant or think they might be pregnant. There is a potential for serious side effects to an unborn child. Men should inform their doctors if they wish to father a child. This medicine may lower sperm counts. Talk to your health care professional or pharmacist for more information. Do not breast-feed an infant while taking this medicine or for 1 year after the last dose. What side effects may I notice from receiving this medicine? Side effects that you should report to your doctor or health care professional as soon as possible:  allergic reactions like skin rash, itching or hives, swelling of the face, lips, or tongue  feeling faint or lightheaded, falls  pain, tingling, numbness, or weakness in the legs  signs and symptoms of infection like fever or chills; cough; flu-like symptoms; sore throat  vaginal bleeding Side effects that usually do not require medical attention (report to your doctor or health care professional if they continue or are bothersome):  aches, pains  constipation  diarrhea  headache  hot flashes  nausea, vomiting  pain at site where injected  stomach pain This list may not describe all possible side effects. Call your doctor for medical advice about side effects. You may report side effects to FDA at 1-800-FDA-1088. Where should I keep my medicine? This drug is given in a hospital or clinic and will   not be stored at home. NOTE: This sheet is a summary. It may not cover all possible information. If you have questions about this medicine, talk to your doctor, pharmacist, or health care provider.  2020 Elsevier/Gold Standard (2017-07-10 11:34:41)  

## 2019-05-03 NOTE — Telephone Encounter (Signed)
I talk with patient regarding schedule  

## 2019-05-03 NOTE — Assessment & Plan Note (Addendum)
Left breast cancer invasive ductal carcinoma 2.5 cm in size grade 1, ER 99%, PR 41%, Ki-67 11%, HER-2 negative T2 N0 M0 stage II a status post neoadjuvant chemotherapy followed by surgery February 2011and wason antiestrogen therapy with Femara from January 2011-July 2017  New onset mid back pain: MRI revealedT11 severe pathologic fracture, abnormal signal T10, T11 and T12, moderate canal stenosis at T11  12/02/2017: PET CT scan hypermetabolic lymphadenopathy in the thoracic and upper abdominal lymph nodes, spine and rib metastases, intramuscular metastases the left pectoralis muscle, T11 vertebral fracture pathologic  Biopsy of the T11 vertebral body ER 90%, PR 0%, Ki-67 10%, HER-2 -1+, foundation 1, PDL 1 Pathologic fracture T11: She met with orthopedics regarding kyphoplasty and they determined that there is no surgical options available for her. ------------------------------------------------------------------- Treatment : 1. Palliative radiation to T11 vertebral body and the pectoralis muscle: 12/20/17-12/26/17 2. Ibrance with letrozole along with Delton See or Zometadiscontinued July 2020 due to progression Patient could not tolerate Faslodex because of intense bone pain as well as hypotension. ---------------------------------------------------------------------------------------------------------------------- Current treatment: Halaven started 10/27/2018, completed 9 cycles as of 04/12/2019 discontinued for progression of disease.  Switching treatment to alpelisib  02/25/2019: PET CT scan: Hypermetabolic left axillary, left internal mammary and subcarinal lymph node and precordial lymph node. Right lower lobe consolidation? Infection, adrenal metastases, new focal lesions in the skeleton compared to PET scan done in August 2019. Many do not have metabolic activity. Large loculated left pleural effusion  04/05/2019: Guardant 360:PIK3CA mutation (alpelisib), NF1 S2 467  (everolimus) ARID1A S1791 (response to neratinib, olaparib, rucaparib, talazoparib), T p53 and SMAD4 Q534, TMB 35.41 Based on these results, it appears that she could respond to alpelisib as well as everolimus and olaparib as well as possibly immunotherapy.  CT CAP 04/30/2019: There is increased now nearly confluent left-sided pleural nodularity, increase in size of pericardial nodule 1.4 cm, enlargement of left axillary lymph nodes 1.4 cm, enlargement of left retroperitoneal lymph node 1.5 cm, no significant change in the sclerotic bone metastases.  Radiation fibrosis in the lungs, loculated left pleural effusion with pleural thickening and nodularity.  Recommendation: Alpelisib with Faslodex Alpelisib (Piqray) Counseling: Alpelisib was studied in SOLAR-1 clinical trial: 923 patients postmenopausal woman who progressed on hormonal therapy were randomized to Fulvestrant + Alpelisib vs Fulvestrant. PFS was 11 months versus 5.7 months in patients who had PIK3CA mutation. Common side effects of Piqray are high blood sugar levels, increase in creatinine, diarrhea, rash, decrease in lymphocyte count, elevated liver enzymes, nausea, fatigue, anemia, increase in lipase, decreased appetite, stomatitis, vomiting, weight loss, low calcium levels, aPTT prolonged, and hair loss.  Previously she could not tolerate Faslodex.  Instead of 500 mg we will administer 250 mg and see if she tolerates it better.  Return to clinic in 2 weeks for toxicity check and follow-up.

## 2019-05-03 NOTE — Patient Instructions (Signed)

## 2019-05-03 NOTE — Progress Notes (Signed)
Patient received faslodex injection today. Pre-medications zofran, and ultram added. Post injection patient experienced a brief headache which resolved within 15 minutes. No other symptoms reported and patient sent home. Patient knows to call if any symptoms arise.

## 2019-05-04 ENCOUNTER — Telehealth: Payer: Self-pay

## 2019-05-04 ENCOUNTER — Telehealth: Payer: Self-pay | Admitting: Pharmacist

## 2019-05-04 NOTE — Telephone Encounter (Signed)
Oral Oncology Pharmacist Encounter  Received new prescription for Piqray (alpelisib) for the treatment of metastatic breast cancer, PIK3CA mutation positive in conjunction with fulvestrant, planned duration until disease progression or unacceptable drug toxicity.  CMP from 05/03/19 assessed, no relevant lab abnormalities. Prescription dose and frequency assessed.   Recommended glucose monitoring: Fasting glucose, weekly for the first 2 weeks, then at least every 4 weeks.  Current medication list in Epic reviewed, no DDIs with alpelisib identified.  Prescription has been e-scribed to the [facility] for benefits analysis and approval.  Oral Oncology Clinic will continue to follow for insurance authorization, copayment issues, initial counseling and start date.  Darl Pikes, PharmD, BCPS, [facility] Hematology/Oncology Clinical Pharmacist [facility]/AP [facility] [phone]  05/04/2019 10:09 AM

## 2019-05-04 NOTE — Telephone Encounter (Signed)
Oral Oncology Patient Advocate Encounter  Completed an online application for [employer] [facility] [place] ([employer]) in an effort to reduce the patient's out of pocket expense for Piqray to $0.    Application completed and faxed to [phone].   [employer] phone number for follow up is [phone].   This encounter will be updated until final determination.   [facility] [place] Phone [phone] Fax [phone] 05/04/2019 2:02 PM

## 2019-05-11 ENCOUNTER — Other Ambulatory Visit: Payer: Self-pay | Admitting: Hematology and Oncology

## 2019-05-14 ENCOUNTER — Other Ambulatory Visit: Payer: Self-pay | Admitting: *Deleted

## 2019-05-14 DIAGNOSIS — C50512 Malignant neoplasm of lower-outer quadrant of left female breast: Secondary | ICD-10-CM

## 2019-05-16 NOTE — Progress Notes (Signed)
Patient Care Team: [name], [place] as PCP - General (Nurse Practitioner)  DIAGNOSIS:    ICD-10-CM   1. Malignant neoplasm of lower-outer quadrant of left breast of female, estrogen receptor positive ([place])  C50.512    Z17.0     SUMMARY OF ONCOLOGIC HISTORY: Oncology History  Breast cancer of lower-outer quadrant of left female breast ([place])  12/13/2008 Initial Diagnosis   Left breast biopsy: Invasive ductal carcinoma ER 90% percent, PR 41%, Ki-67 11%, HER-2 negative ratio 1, Oncotype DX score 23, ROR15%   01/27/2009 - 03/10/2009 Neo-Adjuvant Chemotherapy   Neoadjuvant FEC 4; participant in the "bed sheets" study.   05/12/2009 -  Anti-estrogen oral therapy   Femara 2.5 mg daily   05/22/2009 Surgery   Bilateral mastectomy stage IIB T2 N0 M0 IDC left breast grade 1, 2.5 cm, ER 99%, PR 41%, Ki-67 11%, HER-2 negative   12/10/2009 Surgery   Breast reconstruction surgery   11/14/2017 Relapse/Recurrence   Mid back pain: MRI revealed T11 severe pathologic fracture, abnormal signal T10, T11 and T12, moderate canal stenosis at T11   12/03/2017 PET scan   Hypermetabolic metastatic breast cancer involving thoracic and upper abdominal lymph nodes, spine and ribs. There may be hypermetabolic adenopathy in the left neck; Hypermetabolic lymph node versus intramuscular metastasis involving the medial left pectoralis musculature Pathologic T11 fracture     12/11/2017 Procedure   Biopsy of T11 vertebral body lytic lesion: Metastatic breast adenocarcinoma ER positive, PR negative   12/19/2017 - 12/26/2017 Radiation Therapy   Palliative XRT to T 11   01/12/2018 - 10/26/2018 Anti-estrogen oral therapy   Patient could not tolerate Faslodex because of severe hypotension and severe pain in the legs after injections.  Ibrance with letrozole starting 01/12/2018   10/27/2018 -  Chemotherapy   Palliative chemotherapy with Halaven   Malignant neoplasm of lower-outer quadrant of left breast of female, estrogen  receptor positive ([place])  10/21/2018 Initial Diagnosis   Malignant neoplasm of lower-outer quadrant of left breast of female, estrogen receptor positive ([place])   10/27/2018 - 05/02/2019 Chemotherapy   The patient had eriBULin mesylate (HALAVEN) 1.65 mg in sodium chloride 0.9 % 100 mL chemo infusion, 1.1 mg/m2 = 2.1 mg, Intravenous,  Once, 9 of 10 cycles Dose modification: 1.1 mg/m2 (original dose 1.4 mg/m2, Cycle 1, Reason: Other (see comments), Comment: CrCL < 50) Administration: 1.65 mg (10/27/2018), 1.65 mg (11/03/2018), 1.65 mg (11/17/2018), 1.65 mg (11/24/2018), 1.65 mg (12/08/2018), 1.65 mg (12/15/2018), 1.65 mg (12/29/2018), 1.65 mg (01/18/2019), 1.65 mg (02/08/2019), 1.65 mg (03/01/2019), 1.65 mg (03/22/2019), 1.65 mg (04/12/2019)  for chemotherapy treatment.    11/17/2018 - 12/03/2018 Radiation Therapy   Palliative radiation to the T5 location of the spine and right femur, 35 Gy in 14 fractions   04/05/2019 Miscellaneous   Guardant 360:PIK3CA mutation (alpelisib), NF1 S2 467 (everolimus) ARID1A S1791 (response to neratinib, olaparib, rucaparib, talazoparib), T p53 and SMAD4 Q534, TMB 35.41 mutations /MB, no evidence of MSI high     CHIEF COMPLIANT: Follow-up of metastatic breast cancer  INTERVAL HISTORY: Elizabeth [name] is a 64 y.o. with above-mentioned history of metastatic breast cancerwhohas had recurrent pleural effusions.Sheis currently on treatment with Alpelisib and Faslodex. She presents to the clinic todayfor a toxicity check.   ALLERGIES:  is allergic to shellfish allergy and sulfa antibiotics.  MEDICATIONS:  Current Outpatient Medications  Medication Sig Dispense Refill  . alpelisib (PIQRAY 300MG DAILY DOSE) 2 x 150 MG Therapy Pack Take two 19m tablets with food at the  same time daily. Swallow whole, do not crush, chew, or split. 60 each 3  . Biotin 10000 MCG TABS Take 10,000 mcg by mouth daily.     . Cholecalciferol (VITAMIN D PO) Take 5,000 Units by mouth daily.     . DULoxetine  (CYMBALTA) 30 MG capsule TAKE 1 CAPSULE (30 MG TOTAL) BY MOUTH 2 (TWO) TIMES DAILY. 180 capsule 2  . fentaNYL (DURAGESIC) 25 MCG/HR Place 1 patch onto the skin every 3 (three) days. 10 patch 0  . fluconazole (DIFLUCAN) 100 MG tablet Take 1 tablet (100 mg total) by mouth daily. 3 tablet 0  . gabapentin (NEURONTIN) 300 MG capsule Take 1 capsule (300 mg total) by mouth 2 (two) times daily. 180 capsule 2  . KLOR-CON M20 20 MEQ tablet TAKE 1 TABLET BY MOUTH EVERY DAY 90 tablet 0  . letrozole (FEMARA) 2.5 MG tablet Take 1 tablet (2.5 mg total) by mouth daily. 90 tablet 3  . levothyroxine (SYNTHROID, LEVOTHROID) 25 MCG tablet Take 25 mcg by mouth daily before breakfast.     . loperamide (IMODIUM) 2 MG capsule 1 to 2 PO QID prn diarrhea 30 capsule 22  . magic mouthwash w/lidocaine SOLN Take 5 mLs by mouth 3 (three) times daily as needed for mouth pain. 100 mL 0  . Multiple Vitamin (MULTIVITAMIN) tablet Take 1 tablet by mouth daily.    Marland Kitchen omeprazole (PRILOSEC) 20 MG capsule Take 1 capsule (20 mg total) by mouth daily. 90 capsule 0  . oxyCODONE-acetaminophen (PERCOCET) 10-325 MG tablet Take 1 tablet by mouth every 8 (eight) hours as needed for pain. 90 tablet 0  . prednisoLONE acetate (PRED FORTE) 1 % ophthalmic suspension Place 1 drop into both eyes daily.    . prochlorperazine (COMPAZINE) 5 MG tablet Take 1 tablet (5 mg total) by mouth every 6 (six) hours as needed for nausea or vomiting. 45 tablet 2  . simethicone (GAS-X) 80 MG chewable tablet Chew 1 tablet (80 mg total) by mouth every 6 (six) hours as needed for flatulence. 120 tablet 2  . traMADol (ULTRAM) 50 MG tablet Take 1 tablet (50 mg total) by mouth every 6 (six) hours as needed. 120 tablet 0   No current facility-administered medications for this visit.    PHYSICAL EXAMINATION: ECOG PERFORMANCE STATUS: 1 - Symptomatic but completely ambulatory  Vitals:   05/17/19 1031  BP: 120/80  Pulse: (!) 110  Resp: 20  Temp: 98.8 F (37.1 C)  SpO2:  98%   Filed Weights   05/17/19 1031  Weight: 97 lb 4.8 oz (44.1 kg)    LABORATORY DATA:  I have reviewed the data as listed CMP Latest Ref Rng & Units 05/03/2019 04/12/2019 03/22/2019  Glucose 70 - 99 mg/dL 102(H) 94 100(H)  BUN 8 - 23 mg/dL '13 17 17  ' Creatinine 0.44 - 1.00 mg/dL 0.84 0.83 0.85  Sodium 135 - 145 mmol/L 140 139 138  Potassium 3.5 - 5.1 mmol/L 4.1 4.2 4.4  Chloride 98 - 111 mmol/L 104 104 102  CO2 22 - 32 mmol/L '28 26 29  ' Calcium 8.9 - 10.3 mg/dL 8.8(L) 9.1 8.9  Total Protein 6.5 - 8.1 g/dL 6.7 6.9 6.8  Total Bilirubin 0.3 - 1.2 mg/dL 0.2(L) 0.2(L) 0.2(L)  Alkaline Phos 38 - 126 U/L 143(H) 141(H) 159(H)  AST 15 - 41 U/L 32 29 34  ALT 0 - 44 U/L '15 17 24    ' Lab Results  Component Value Date   WBC 4.7 05/17/2019   HGB  12.3 05/17/2019   HCT 38.6 05/17/2019   MCV 96.3 05/17/2019   PLT 416 (H) 05/17/2019   NEUTROABS 3.3 05/17/2019    ASSESSMENT & PLAN:  Breast cancer of lower-outer quadrant of left female breast Left breast cancer invasive ductal carcinoma 2.5 cm in size grade 1, ER 99%, PR 41%, Ki-67 11%, HER-2 negative T2 N0 M0 stage II a status post neoadjuvant chemotherapy followed by surgery February 2011and wason antiestrogen therapy with Femara from January 2011-July 2017  Treatment : 1. Palliative radiation to T11 vertebral body and the pectoralis muscle: 12/20/17-12/26/17 2. Ibrance with letrozole along with Delton See or Zometadiscontinued July 2020 due to progression Patient could not tolerate Faslodex because of intense bone pain as well as hypotension.  04/05/2019:Guardant 360:PIK3CA mutation (alpelisib), NF1 S2 467 (everolimus) ARID1A S1791 (response to neratinib, olaparib, rucaparib, talazoparib), T p53 and SMAD4 Q534, TMB 35.41 Based on these results, it appears that she could respond to alpelisib as well as everolimus and olaparib as well as possibly immunotherapy.  CT CAP 04/30/2019: There is increased now nearly confluent left-sided pleural  nodularity, increase in size of pericardial nodule 1.4 cm, enlargement of left axillary lymph nodes 1.4 cm, enlargement of left retroperitoneal lymph node 1.5 cm, no significant change in the sclerotic bone metastases.  Radiation fibrosis in the lungs, loculated left pleural effusion with pleural thickening and nodularity. ---------------------------------------------------------------------------------------------------------------------- Current treatment: Halaven started 10/27/2018, completed 9 cycles as of 04/12/2019 discontinued for progression of disease.  Switching treatment to Alpelisib with Faslodex (250 mg because of prior intolerance) started 05/03/19 Unfortunately patient has not yet received her medication.  We are trying to work with the pharmacy to get her prescription filled.  Premedication with Zofran and pain medication like Ultram or Percocet. Toxicities: So far she has tolerated fairly well.  She did have some bone pain and achiness after the injection that lasted for 3 to 4 days but then it resolved.  RTC in 2 weeks for labs and follow up   No orders of the defined types were placed in this encounter.  The patient has a good understanding of the overall plan. she agrees with it. she will call with any problems that may develop before the next visit here.  Total time spent: 30 mins including face to face time and time spent for planning, charting and coordination of care  Nicholas Lose, MD 05/17/2019  I, Cloyde Reams Dorshimer, am acting as scribe for Dr. Nicholas Lose.  I have reviewed the above documentation for accuracy and completeness, and I agree with the above.

## 2019-05-17 ENCOUNTER — Inpatient Hospital Stay: Payer: Medicare Other | Attending: Hematology and Oncology

## 2019-05-17 ENCOUNTER — Inpatient Hospital Stay: Payer: Medicare Other

## 2019-05-17 ENCOUNTER — Inpatient Hospital Stay (HOSPITAL_BASED_OUTPATIENT_CLINIC_OR_DEPARTMENT_OTHER): Payer: Medicare Other | Admitting: Hematology and Oncology

## 2019-05-17 ENCOUNTER — Other Ambulatory Visit: Payer: Self-pay

## 2019-05-17 DIAGNOSIS — Z79818 Long term (current) use of other agents affecting estrogen receptors and estrogen levels: Secondary | ICD-10-CM | POA: Diagnosis not present

## 2019-05-17 DIAGNOSIS — Z9013 Acquired absence of bilateral breasts and nipples: Secondary | ICD-10-CM | POA: Diagnosis not present

## 2019-05-17 DIAGNOSIS — Z79899 Other long term (current) drug therapy: Secondary | ICD-10-CM | POA: Insufficient documentation

## 2019-05-17 DIAGNOSIS — C50512 Malignant neoplasm of lower-outer quadrant of left female breast: Secondary | ICD-10-CM

## 2019-05-17 DIAGNOSIS — J329 Chronic sinusitis, unspecified: Secondary | ICD-10-CM | POA: Insufficient documentation

## 2019-05-17 DIAGNOSIS — Z170 Estrogen receptor positive status [ER+]: Secondary | ICD-10-CM

## 2019-05-17 DIAGNOSIS — R634 Abnormal weight loss: Secondary | ICD-10-CM | POA: Diagnosis not present

## 2019-05-17 DIAGNOSIS — R630 Anorexia: Secondary | ICD-10-CM | POA: Insufficient documentation

## 2019-05-17 DIAGNOSIS — Z923 Personal history of irradiation: Secondary | ICD-10-CM | POA: Diagnosis not present

## 2019-05-17 DIAGNOSIS — C7951 Secondary malignant neoplasm of bone: Secondary | ICD-10-CM | POA: Insufficient documentation

## 2019-05-17 DIAGNOSIS — E039 Hypothyroidism, unspecified: Secondary | ICD-10-CM | POA: Diagnosis not present

## 2019-05-17 DIAGNOSIS — R739 Hyperglycemia, unspecified: Secondary | ICD-10-CM

## 2019-05-17 DIAGNOSIS — G893 Neoplasm related pain (acute) (chronic): Secondary | ICD-10-CM | POA: Insufficient documentation

## 2019-05-17 DIAGNOSIS — E7800 Pure hypercholesterolemia, unspecified: Secondary | ICD-10-CM | POA: Diagnosis not present

## 2019-05-17 DIAGNOSIS — M1612 Unilateral primary osteoarthritis, left hip: Secondary | ICD-10-CM | POA: Diagnosis not present

## 2019-05-17 DIAGNOSIS — Z9221 Personal history of antineoplastic chemotherapy: Secondary | ICD-10-CM | POA: Diagnosis not present

## 2019-05-17 LAB — CMP (CANCER CENTER ONLY)
ALT: 18 U/L (ref 0–44)
AST: 39 U/L (ref 15–41)
Albumin: 3.5 g/dL (ref 3.5–5.0)
Alkaline Phosphatase: 142 U/L — ABNORMAL HIGH (ref 38–126)
Anion gap: 11 (ref 5–15)
BUN: 14 mg/dL (ref 8–23)
CO2: 27 mmol/L (ref 22–32)
Calcium: 9.2 mg/dL (ref 8.9–10.3)
Chloride: 104 mmol/L (ref 98–111)
Creatinine: 0.92 mg/dL (ref 0.44–1.00)
GFR, Est AFR Am: >60 mL/min (ref >60)
GFR, Estimated: >60 mL/min (ref >60)
Glucose, Bld: 134 mg/dL — ABNORMAL HIGH (ref 70–99)
Potassium: 3.9 mmol/L (ref 3.5–5.1)
Sodium: 142 mmol/L (ref 135–145)
Total Bilirubin: 0.3 mg/dL (ref 0.3–1.2)
Total Protein: 7 g/dL (ref 6.5–8.1)

## 2019-05-17 LAB — CBC WITH DIFFERENTIAL (CANCER CENTER ONLY)
Abs Immature Granulocytes: 0.01 10*3/uL (ref 0.00–0.07)
Basophils Absolute: 0 10*3/uL (ref 0.0–0.1)
Basophils Relative: 1 %
Eosinophils Absolute: 0.3 10*3/uL (ref 0.0–0.5)
Eosinophils Relative: 6 %
HCT: 38.6 % (ref 36.0–46.0)
Hemoglobin: 12.3 g/dL (ref 12.0–15.0)
Immature Granulocytes: 0 %
Lymphocytes Relative: 14 %
Lymphs Abs: 0.6 10*3/uL — ABNORMAL LOW (ref 0.7–4.0)
MCH: 30.7 pg (ref 26.0–34.0)
MCHC: 31.9 g/dL (ref 30.0–36.0)
MCV: 96.3 fL (ref 80.0–100.0)
Monocytes Absolute: 0.4 10*3/uL (ref 0.1–1.0)
Monocytes Relative: 9 %
Neutro Abs: 3.3 10*3/uL (ref 1.7–7.7)
Neutrophils Relative %: 70 %
Platelet Count: 416 10*3/uL — ABNORMAL HIGH (ref 150–400)
RBC: 4.01 MIL/uL (ref 3.87–5.11)
RDW: 15.3 % (ref 11.5–15.5)
WBC Count: 4.7 10*3/uL (ref 4.0–10.5)
nRBC: 0 % (ref 0.0–0.2)

## 2019-05-17 LAB — HEMOGLOBIN A1C
Hgb A1c MFr Bld: 5.4 % (ref 4.8–5.6)
Mean Plasma Glucose: 108.28 mg/dL

## 2019-05-17 MED ORDER — FULVESTRANT 250 MG/5ML IM SOLN
250.0000 mg | Freq: Once | INTRAMUSCULAR | Status: AC
  Administered 2019-05-17: 250 mg via INTRAMUSCULAR

## 2019-05-17 MED ORDER — FULVESTRANT 250 MG/5ML IM SOLN
INTRAMUSCULAR | Status: AC
  Filled 2019-05-17: qty 5

## 2019-05-17 NOTE — Assessment & Plan Note (Signed)
Left breast cancer invasive ductal carcinoma 2.5 cm in size grade 1, ER 99%, PR 41%, Ki-67 11%, HER-2 negative T2 N0 M0 stage II a status post neoadjuvant chemotherapy followed by surgery February 2011and wason antiestrogen therapy with Femara from January 2011-July 2017  Treatment : 1. Palliative radiation to T11 vertebral body and the pectoralis muscle: 12/20/17-12/26/17 2. Ibrance with letrozole along with Delton See or Zometadiscontinued July 2020 due to progression Patient could not tolerate Faslodex because of intense bone pain as well as hypotension.  04/05/2019:Guardant 360:PIK3CA mutation (alpelisib), NF1 S2 467 (everolimus) ARID1A S1791 (response to neratinib, olaparib, rucaparib, talazoparib), T p53 and SMAD4 Q534, TMB 35.41 Based on these results, it appears that she could respond to alpelisib as well as everolimus and olaparib as well as possibly immunotherapy.  CT CAP 04/30/2019: There is increased now nearly confluent left-sided pleural nodularity, increase in size of pericardial nodule 1.4 cm, enlargement of left axillary lymph nodes 1.4 cm, enlargement of left retroperitoneal lymph node 1.5 cm, no significant change in the sclerotic bone metastases.  Radiation fibrosis in the lungs, loculated left pleural effusion with pleural thickening and nodularity. ---------------------------------------------------------------------------------------------------------------------- Current treatment: Halaven started 10/27/2018, completed 9 cycles as of 04/12/2019 discontinued for progression of disease.  Switching treatment to Alpelisib with Faslodex (250 mg because of prior intolerance) started 05/03/19  Premedication with Zofran and pain medication like Ultram or Percocet. Toxicities:  RTC in 2 weeks for labs and follow up

## 2019-05-17 NOTE — Patient Instructions (Signed)
Fulvestrant injection What is this medicine? FULVESTRANT (ful VES trant) blocks the effects of estrogen. It is used to treat breast cancer. This medicine may be used for other purposes; ask your health care provider or pharmacist if you have questions. COMMON BRAND NAME(S): FASLODEX What should I tell my health care provider before I take this medicine? They need to know if you have any of these conditions:  bleeding disorders  liver disease  low blood counts, like low white cell, platelet, or red cell counts  an unusual or allergic reaction to fulvestrant, other medicines, foods, dyes, or preservatives  pregnant or trying to get pregnant  breast-feeding How should I use this medicine? This medicine is for injection into a muscle. It is usually given by a health care professional in a hospital or clinic setting. Talk to your pediatrician regarding the use of this medicine in children. Special care may be needed. Overdosage: If you think you have taken too much of this medicine contact a poison control center or emergency room at once. NOTE: This medicine is only for you. Do not share this medicine with others. What if I miss a dose? It is important not to miss your dose. Call your doctor or health care professional if you are unable to keep an appointment. What may interact with this medicine?  medicines that treat or prevent blood clots like warfarin, enoxaparin, dalteparin, apixaban, dabigatran, and rivaroxaban This list may not describe all possible interactions. Give your health care provider a list of all the medicines, herbs, non-prescription drugs, or dietary supplements you use. Also tell them if you smoke, drink alcohol, or use illegal drugs. Some items may interact with your medicine. What should I watch for while using this medicine? Your condition will be monitored carefully while you are receiving this medicine. You will need important blood work done while you are taking  this medicine. Do not become pregnant while taking this medicine or for at least 1 year after stopping it. Women of child-bearing potential will need to have a negative pregnancy test before starting this medicine. Women should inform their doctor if they wish to become pregnant or think they might be pregnant. There is a potential for serious side effects to an unborn child. Men should inform their doctors if they wish to father a child. This medicine may lower sperm counts. Talk to your health care professional or pharmacist for more information. Do not breast-feed an infant while taking this medicine or for 1 year after the last dose. What side effects may I notice from receiving this medicine? Side effects that you should report to your doctor or health care professional as soon as possible:  allergic reactions like skin rash, itching or hives, swelling of the face, lips, or tongue  feeling faint or lightheaded, falls  pain, tingling, numbness, or weakness in the legs  signs and symptoms of infection like fever or chills; cough; flu-like symptoms; sore throat  vaginal bleeding Side effects that usually do not require medical attention (report to your doctor or health care professional if they continue or are bothersome):  aches, pains  constipation  diarrhea  headache  hot flashes  nausea, vomiting  pain at site where injected  stomach pain This list may not describe all possible side effects. Call your doctor for medical advice about side effects. You may report side effects to FDA at 1-800-FDA-1088. Where should I keep my medicine? This drug is given in a hospital or clinic and will   not be stored at home. NOTE: This sheet is a summary. It may not cover all possible information. If you have questions about this medicine, talk to your doctor, pharmacist, or health care provider.  2020 Elsevier/Gold Standard (2017-07-10 11:34:41)  

## 2019-05-18 ENCOUNTER — Telehealth: Payer: Self-pay | Admitting: Hematology and Oncology

## 2019-05-18 NOTE — Telephone Encounter (Signed)
I talk with patient regarding schedule  

## 2019-05-20 ENCOUNTER — Other Ambulatory Visit: Payer: Self-pay | Admitting: Hematology and Oncology

## 2019-05-20 MED ORDER — FENTANYL 25 MCG/HR TD PT72: 1.0000 | MEDICATED_PATCH | TRANSDERMAL | 0 refills | Status: DC

## 2019-05-21 ENCOUNTER — Telehealth: Payer: Self-pay

## 2019-05-21 ENCOUNTER — Telehealth: Payer: Self-pay | Admitting: Pharmacist

## 2019-05-21 NOTE — Telephone Encounter (Signed)
Oral Oncology Patient Advocate Encounter  Was successful in securing patient a $15000 grant from [employer] to provide copayment coverage for [place].  This will keep the out of pocket expense at $0.     [employer] ID: [id number]  I have spoken with the patient.   The billing information is as follows and has been shared with [place].    RxBin: [id number] PCN: [id number] Member ID: [id number] Group ID: [id number] Dates of Eligibility: 04/21/19 through 04/19/20

## 2019-05-24 ENCOUNTER — Other Ambulatory Visit: Payer: Self-pay | Admitting: Hematology and Oncology

## 2019-05-24 ENCOUNTER — Ambulatory Visit: Payer: 59 | Admitting: Adult Health

## 2019-05-24 ENCOUNTER — Ambulatory Visit: Payer: 59

## 2019-05-24 ENCOUNTER — Other Ambulatory Visit: Payer: 59

## 2019-05-24 ENCOUNTER — Ambulatory Visit: Payer: Medicare Other | Admitting: Adult Health

## 2019-05-24 ENCOUNTER — Other Ambulatory Visit: Payer: Self-pay

## 2019-05-24 ENCOUNTER — Other Ambulatory Visit: Payer: Medicare Other

## 2019-05-24 MED ORDER — ONDANSETRON HCL 8 MG PO TABS: 8.0000 mg | ORAL_TABLET | Freq: Three times a day (TID) | ORAL | 3 refills | Status: AC | PRN

## 2019-05-24 NOTE — Telephone Encounter (Signed)
Oral Chemotherapy Pharmacist Encounter  Patient started 14 day supply of Piqray on 05/21/2019.  I spoke with patient for overview of new oral chemotherapy medication: Piqray (alpelisib) for the treatment of metastatic, hormone receptor positive breast cancer, previously treated with endocrine based therapy, with known PIK3CA mutation, in conjunction with Faslodex, planned duration until disease progression or unacceptable toxicity.  Counseled on administration, dosing, side effects, monitoring, drug-food interactions, safe handling, storage, and disposal.  Patient will take Piqray 156m tablets, 2 tablets (300 mg) by mouth once daily with food.  Fluvestrant will be initiated with loading sequence as 505mIM injection on days 1, 15, and 28, continued thereafter as 50024mM injections once monthly.  Fulvestrant start date: 05/03/19 Piqray start date: 05/21/19  Side effects include but not limited to: hyperglycemia, rash/hypersensitivity reaction, diarrhea, fatigue, nausea, vomiting, mouth sores, alopecia, taste changes, decreased appetite, severe lung reactions, and arthralgias.  Patient stated she will need an antiemetic prescription sent in to have on hand for nausea/vomiting. Will contact MD. Patient will obtain anti diarrheal and alert the office of 4 or more loose stools above baseline.  Discussed with patient need for glucose control prior to [place], and possible need for anti-diabetic agents for the treatment of medication-induced hyperglycemia while on treatment with Piqray.    Discussed possibility of severe cutaneous reactions with Piqray. Patient will alert the office of any dermatologic toxicity that may occur after Piqray initiation.  Reviewed with patient importance of keeping a medication schedule and plan for any missed doses.  Medication reconciliation performed and medication/allergy list updated.  Insurance authorization for PiqJ[place]ene Drafts been obtained. This will  ship from the [facility] 05/25/19 to deliver to patient's home on 05/26/19.  Patient informed the pharmacy will reach out 5-7 days prior to needing next fill of Piqray to coordinate continued medication acquisition to prevent break in therapy.  All questions answered.  Ms. AppGingrichiced understanding and appreciation.   Patient knows to call the office with questions or concerns.  RebLeron CroakharmD, [id number] Hematology/Oncology Pharmacy Resident 05/24/2019 12:05 PM Oral Oncology Clinic [phone]

## 2019-05-25 MED FILL — PIQRAY 300MG DAILY DOSE 2X1: 2 X 150 | 28 days supply | Qty: 56 | Fill #0

## 2019-05-28 ENCOUNTER — Other Ambulatory Visit: Payer: Self-pay | Admitting: *Deleted

## 2019-05-28 DIAGNOSIS — C50512 Malignant neoplasm of lower-outer quadrant of left female breast: Secondary | ICD-10-CM

## 2019-05-31 ENCOUNTER — Inpatient Hospital Stay: Payer: Medicare Other

## 2019-05-31 ENCOUNTER — Ambulatory Visit (HOSPITAL_COMMUNITY)
Admission: RE | Admit: 2019-05-31 | Discharge: 2019-05-31 | Disposition: A | Discharge status: Home / Self Care | Payer: Medicare Other | Source: Ambulatory Visit | Attending: Adult Health | Admitting: Adult Health

## 2019-05-31 ENCOUNTER — Other Ambulatory Visit: Payer: Self-pay

## 2019-05-31 ENCOUNTER — Encounter: Payer: Self-pay | Admitting: Adult Health

## 2019-05-31 ENCOUNTER — Inpatient Hospital Stay (HOSPITAL_BASED_OUTPATIENT_CLINIC_OR_DEPARTMENT_OTHER): Payer: Medicare Other | Admitting: Adult Health

## 2019-05-31 VITALS — BP 138/84 | HR 87 | Temp 97.8°F | Resp 18 | Ht 63.0 in | Wt 92.4 lb

## 2019-05-31 DIAGNOSIS — C7951 Secondary malignant neoplasm of bone: Secondary | ICD-10-CM | POA: Insufficient documentation

## 2019-05-31 DIAGNOSIS — C50512 Malignant neoplasm of lower-outer quadrant of left female breast: Secondary | ICD-10-CM

## 2019-05-31 DIAGNOSIS — Z170 Estrogen receptor positive status [ER+]: Secondary | ICD-10-CM | POA: Insufficient documentation

## 2019-05-31 DIAGNOSIS — Z95828 Presence of other vascular implants and grafts: Secondary | ICD-10-CM

## 2019-05-31 LAB — CBC WITH DIFFERENTIAL (CANCER CENTER ONLY)
Abs Immature Granulocytes: 0.01 10*3/uL (ref 0.00–0.07)
Basophils Absolute: 0.1 10*3/uL (ref 0.0–0.1)
Basophils Relative: 2 %
Eosinophils Absolute: 0.5 10*3/uL (ref 0.0–0.5)
Eosinophils Relative: 10 %
HCT: 43.3 % (ref 36.0–46.0)
Hemoglobin: 13.9 g/dL (ref 12.0–15.0)
Immature Granulocytes: 0 %
Lymphocytes Relative: 28 %
Lymphs Abs: 1.3 10*3/uL (ref 0.7–4.0)
MCH: 30.2 pg (ref 26.0–34.0)
MCHC: 32.1 g/dL (ref 30.0–36.0)
MCV: 94.1 fL (ref 80.0–100.0)
Monocytes Absolute: 0.8 10*3/uL (ref 0.1–1.0)
Monocytes Relative: 15 %
Neutro Abs: 2.2 10*3/uL (ref 1.7–7.7)
Neutrophils Relative %: 45 %
Platelet Count: 407 10*3/uL — ABNORMAL HIGH (ref 150–400)
RBC: 4.6 MIL/uL (ref 3.87–5.11)
RDW: 15 % (ref 11.5–15.5)
WBC Count: 4.9 10*3/uL (ref 4.0–10.5)
nRBC: 0 % (ref 0.0–0.2)

## 2019-05-31 LAB — CMP (CANCER CENTER ONLY)
ALT: 17 U/L (ref 0–44)
AST: 26 U/L (ref 15–41)
Albumin: 3.7 g/dL (ref 3.5–5.0)
Alkaline Phosphatase: 104 U/L (ref 38–126)
Anion gap: 9 (ref 5–15)
BUN: 14 mg/dL (ref 8–23)
CO2: 30 mmol/L (ref 22–32)
Calcium: 8.9 mg/dL (ref 8.9–10.3)
Chloride: 100 mmol/L (ref 98–111)
Creatinine: 1.14 mg/dL — ABNORMAL HIGH (ref 0.44–1.00)
GFR, Est AFR Am: 58 mL/min — ABNORMAL LOW (ref >60)
GFR, Estimated: 50 mL/min — ABNORMAL LOW (ref >60)
Glucose, Bld: 155 mg/dL — ABNORMAL HIGH (ref 70–99)
Potassium: 4 mmol/L (ref 3.5–5.1)
Sodium: 139 mmol/L (ref 135–145)
Total Bilirubin: 0.3 mg/dL (ref 0.3–1.2)
Total Protein: 7.2 g/dL (ref 6.5–8.1)

## 2019-05-31 MED ORDER — FULVESTRANT 250 MG/5ML IM SOLN
INTRAMUSCULAR | Status: AC
  Filled 2019-05-31: qty 5

## 2019-05-31 MED ORDER — HEPARIN SOD (PORK) LOCK FLUSH 100 UNIT/ML IV SOLN
500.0000 [IU] | Freq: Once | INTRAVENOUS | Status: AC
  Administered 2019-05-31: 12:00:00 500 [IU]
  Filled 2019-05-31: qty 5

## 2019-05-31 MED ORDER — SODIUM CHLORIDE 0.9% FLUSH
10.0000 mL | Freq: Once | INTRAVENOUS | Status: AC
  Administered 2019-05-31: 10 mL
  Filled 2019-05-31: qty 10

## 2019-05-31 MED ORDER — FULVESTRANT 250 MG/5ML IM SOLN
250.0000 mg | Freq: Once | INTRAMUSCULAR | Status: AC
  Administered 2019-05-31: 250 mg via INTRAMUSCULAR

## 2019-05-31 NOTE — Assessment & Plan Note (Addendum)
Left breast cancer invasive ductal carcinoma 2.5 cm in size grade 1, ER 99%, PR 41%, Ki-67 11%, HER-2 negative T2 N0 M0 stage II a status post neoadjuvant chemotherapy followed by surgery February 2011and wason antiestrogen therapy with Femara from January 2011-July 2017  Treatment : 1. Palliative radiation to T11 vertebral body and the pectoralis muscle: 12/20/17-12/26/17 2. Ibrance with letrozole along with Delton See or Zometadiscontinued July 2020 due to progression Patient could not tolerate Faslodex because of intense bone pain as well as hypotension.  04/05/2019:Guardant 360:PIK3CA mutation (alpelisib), NF1 S2 467 (everolimus) ARID1A S1791 (response to neratinib, olaparib, rucaparib, talazoparib), T p53 and SMAD4 Q534, TMB 35.41 Based on these results, it appears that she could respond to alpelisib as well as everolimus and olaparib as well as possibly immunotherapy.  CT CAP 04/30/2019: There is increased now nearly confluent left-sided pleural nodularity, increase in size of pericardial nodule 1.4 cm, enlargement of left axillary lymph nodes 1.4 cm, enlargement of left retroperitoneal lymph node 1.5 cm, no significant change in the sclerotic bone metastases.  Radiation fibrosis in the lungs, loculated left pleural effusion with pleural thickening and nodularity. ---------------------------------------------------------------------------------------------------------------------- Current treatment: Halaven started 10/27/2018, completed 9 cycles as of 04/12/2019 discontinued for progression of disease.   Alpelisib with Faslodex (250 mg because of prior intolerance) started 05/03/19  Premedication with Zofran and pain medication like Ultram or Percocet. Tolerating treatment moderately well.  Will continue. 1. Metastatic cancer related pain: will get plain films to r/o fracture.  She will continue the fentanyl patch and percocet for now.  Should pain worsen, may need to do CT chest.   2.  Weight  loss: placed nutrition referral.  Nevin Bloodgood and I talked about PO intake and increased protein in her diet.    Due to her pain and weight loss, she will return in 2 weeks for labs and f/u with Dr. Lindi Adie.

## 2019-05-31 NOTE — Progress Notes (Signed)
[facility] up:    [place], [name], [place] [address] [place] [address]   DIAGNOSIS: Cancer Staging Breast cancer of lower-outer quadrant of left female breast ([place]) Staging form: Breast, AJCC 7th Edition - Clinical: Stage IV (M1) - Unsigned   SUMMARY OF ONCOLOGIC HISTORY: Oncology History  Breast cancer of lower-outer quadrant of left female breast ([place])  12/13/2008 Initial Diagnosis   Left breast biopsy: Invasive ductal carcinoma ER 90% percent, PR 41%, Ki-67 11%, HER-2 negative ratio 1, Oncotype DX score 23, ROR15%   01/27/2009 - 03/10/2009 Neo-Adjuvant Chemotherapy   Neoadjuvant FEC 4; participant in the "bed sheets" study.   05/12/2009 -  Anti-estrogen oral therapy   Femara 2.5 mg daily   05/22/2009 Surgery   Bilateral mastectomy stage IIB T2 N0 M0 IDC left breast grade 1, 2.5 cm, ER 99%, PR 41%, Ki-67 11%, HER-2 negative   12/10/2009 Surgery   Breast reconstruction surgery   11/14/2017 Relapse/Recurrence   Mid back pain: MRI revealed T11 severe pathologic fracture, abnormal signal T10, T11 and T12, moderate canal stenosis at T11   12/03/2017 PET scan   Hypermetabolic metastatic breast cancer involving thoracic and upper abdominal lymph nodes, spine and ribs. There may be hypermetabolic adenopathy in the left neck; Hypermetabolic lymph node versus intramuscular metastasis involving the medial left pectoralis musculature Pathologic T11 fracture     12/11/2017 Procedure   Biopsy of T11 vertebral body lytic lesion: Metastatic breast adenocarcinoma ER positive, PR negative   12/19/2017 - 12/26/2017 Radiation Therapy   Palliative XRT to T 11   01/12/2018 - 10/26/2018 Anti-estrogen oral therapy   Patient could not tolerate Faslodex because of severe hypotension and severe pain in the legs after injections.  Ibrance with letrozole starting 01/12/2018   10/27/2018 -  Chemotherapy   Palliative chemotherapy with Halaven   Malignant neoplasm  of lower-outer quadrant of left breast of female, estrogen receptor positive ([place])  10/21/2018 Initial Diagnosis   Malignant neoplasm of lower-outer quadrant of left breast of female, estrogen receptor positive ([place])   10/27/2018 - 05/02/2019 Chemotherapy   The patient had eriBULin mesylate (HALAVEN) 1.65 mg in sodium chloride 0.9 % 100 mL chemo infusion, 1.1 mg/m2 = 2.1 mg, Intravenous,  Once, 9 of 10 cycles Dose modification: 1.1 mg/m2 (original dose 1.4 mg/m2, Cycle 1, Reason: Other (see comments), Comment: CrCL < 50) Administration: 1.65 mg (10/27/2018), 1.65 mg (11/03/2018), 1.65 mg (11/17/2018), 1.65 mg (11/24/2018), 1.65 mg (12/08/2018), 1.65 mg (12/15/2018), 1.65 mg (12/29/2018), 1.65 mg (01/18/2019), 1.65 mg (02/08/2019), 1.65 mg (03/01/2019), 1.65 mg (03/22/2019), 1.65 mg (04/12/2019)  for chemotherapy treatment.    11/17/2018 - 12/03/2018 Radiation Therapy   Palliative radiation to the T5 location of the spine and right femur, 35 Gy in 14 fractions   04/05/2019 Miscellaneous   Guardant 360:PIK3CA mutation (alpelisib), NF1 S2 467 (everolimus) ARID1A S1791 (response to neratinib, olaparib, rucaparib, talazoparib), T p53 and SMAD4 Q534, TMB 35.41 mutations /MB, no evidence of MSI high     CURRENT THERAPY: Faslodex, Alpelisib, Zometa (last on 1/18)  INTERVAL HISTORY: Mackinzee Roszak Crays 63 y.o. female returns for evaluation of her metastatic breast cancer having recently started Faslodex and Alpelisib. She is tolerating treatment moderately well.  She has fentanyl patches she is using, she is also taking percocet for break through pain.  She notes the pain is in the area where her pleurx was placed.  She denies any increased shortness of breath, her dyspnea has remained at baseline.  She receives Zometa every 12 weeks and last received this on 05/03/2019.  She notes achiness after this infusion x 4 days.  She denies any dental concerns.  Derrick has a decreased appetite, and says that food just doesn't taste good.    She has lost 5 pounds in the past 2 weeks.  She is unable to drink ensure because it is too strong of a taste for her and makes her feel sick.     Patient Active Problem List   Diagnosis Date Noted  . Malignant neoplasm of lower-outer quadrant of left breast of female, estrogen receptor positive ([place]) 10/21/2018  . Goals of care, counseling/discussion 10/21/2018  . Bone metastasis ([place]) 12/22/2017  . Primary osteoarthritis of left hip 05/26/2017  . Osteoarthritis of left hip 05/23/2017  . Breast cancer of lower-outer quadrant of left female breast ([place]) 08/06/2012    is allergic to shellfish allergy and sulfa antibiotics.  MEDICAL HISTORY: Past Medical History:  Diagnosis Date  . Arthritis   . Breast cancer ([place]) 2010   Left  . Chronic sinusitis   . Family history of adverse reaction to anesthesia    sister-nausea/vomiting  . History of chemotherapy 01/2009-03/2009  . Hypercholesteremia   . Hypothyroid    "inactive thyroid"  . Metastasis ([place]) 11/2017   LN  . Metastatic cancer to bone ([place]) 11/2017  . Migraines   . RSD (reflex sympathetic dystrophy)     SURGICAL HISTORY: Past Surgical History:  Procedure Laterality Date  . BREAST RECONSTRUCTION    . EYE SURGERY Bilateral 7/18, 10/18  . IR IMAGING GUIDED PORT INSERTION  10/28/2018  . IR INSTILL VIA CHEST TUBE AGENT FOR FIBRINOLYSIS INI DAY  10/09/2018  . IR PATIENT EVAL TECH 0-60 MINS  09/08/2018  . IR PERC PLEURAL DRAIN W/INDWELL CATH W/IMG GUIDE  08/05/2018  . IR RADIOLOGIST EVAL & MGMT  10/12/2018  . IR REMOVAL OF PLURAL CATH W/CUFF  10/26/2018  . KNEE ARTHROSCOPY Bilateral   . KNEE SURGERY Right    Two surgeries  . MASTECTOMY Bilateral 05/22/2009  . MASTECTOMY Bilateral 2011   restrictions on left arm-no labs/bloodpressure  . NASAL SINUS SURGERY    . PORTA CATH INSERTION  12/2008  . PORTA CATH REMOVAL  05/2009  . ROOT CANAL    . SHOULDER SURGERY Right   . TOTAL HIP ARTHROPLASTY Left 05/26/2017   Procedure: TOTAL  HIP ARTHROPLASTY ANTERIOR APPROACH;  Surgeon: Frederik Pear, MD;  Location: [facility];  Service: Orthopedics;  Laterality: Left;    SOCIAL HISTORY: Social History   Socioeconomic History  . Marital status: Married    Spouse name: Not on file  . Number of children: Not on file  . Years of education: Not on file  . Highest education level: Not on file  Occupational History  . Not on file  Tobacco Use  . Smoking status: Never Smoker  . Smokeless tobacco: Never Used  Substance and Sexual Activity  . Alcohol use: No  . Drug use: No  . Sexual activity: Yes    Birth control/protection: Post-menopausal  Other Topics Concern  . Not on file  Social History Narrative  . Not on file   Social Determinants of Health   Financial Resource Strain:   . Difficulty of Paying Living Expenses: Not on file  Food Insecurity:   . Worried About Charity fundraiser in the Last Year: Not on file  . Ran Out of Food in the Last Year: Not on file  Transportation Needs:   . Film/video editor (Medical): Not on file  . Lack of Transportation (Non-Medical): Not on file  Physical Activity:   . Days of Exercise per Week: Not on file  . Minutes of Exercise per Session: Not on file  Stress:   . Feeling of Stress : Not on file  Social Connections:   . Frequency of Communication with Friends and Family: Not on file  . Frequency of Social Gatherings with Friends and Family: Not on file  . Attends Religious Services: Not on file  . Active Member of Clubs or Organizations: Not on file  . Attends Archivist Meetings: Not on file  . Marital Status: Not on file  Intimate Partner Violence:   . Fear of Current or Ex-Partner: Not on file  . Emotionally Abused: Not on file  . Physically Abused: Not on file  . Sexually Abused: Not on file    FAMILY HISTORY: Family History  Problem Relation Age of Onset  . Hypertension Mother     Review of Systems  Constitutional: Positive for appetite change,  fatigue and unexpected weight change.  HENT:   Negative for hearing loss, mouth sores and trouble swallowing.   Eyes: Negative for eye problems and icterus.  Respiratory: Negative for chest tightness, cough and shortness of breath.   Cardiovascular: Positive for chest pain. Negative for leg swelling and palpitations.  Gastrointestinal: Negative for abdominal distention, abdominal pain, constipation, diarrhea, nausea and vomiting.  Endocrine: Negative for hot flashes.  Genitourinary: Negative for difficulty urinating.   Musculoskeletal: Negative for arthralgias.  Skin: Negative for itching and rash.  Neurological: Negative for dizziness, extremity weakness, headaches and numbness.  Psychiatric/Behavioral: Negative for depression. The patient is not nervous/anxious.       PHYSICAL EXAMINATION  ECOG PERFORMANCE STATUS: 2 - Symptomatic, <50% confined to bed  Vitals:   05/31/19 1217  BP: 138/84  Pulse: 87  Resp: 18  Temp: 97.8 F (36.6 C)  SpO2: 100%    Physical Exam Constitutional:      General: She is not in acute distress.    Appearance: Normal appearance. She is not toxic-appearing.  HENT:     Head: Normocephalic and atraumatic.  Eyes:     General: No scleral icterus. Cardiovascular:     Rate and Rhythm: Normal rate and regular rhythm.     Pulses: Normal pulses.     Heart sounds: Normal heart sounds.  Pulmonary:     Effort: Pulmonary effort is normal.     Breath sounds: Normal breath sounds.  Abdominal:     General: Abdomen is flat. Bowel sounds are normal.     Palpations: Abdomen is soft.  Musculoskeletal:        General: No swelling.     Cervical back: Neck supple.  Lymphadenopathy:     Cervical: No cervical adenopathy.  Skin:    General: Skin is warm and dry.     Capillary Refill: Capillary refill takes less than 2 seconds.     Findings: No rash.  Neurological:     General: No focal deficit present.     Mental Status: She is alert.  Psychiatric:         Mood and Affect: Mood normal.        Behavior: Behavior normal.     LABORATORY DATA:  CBC    Component Value Date/Time   WBC 4.9 05/31/2019 1150   WBC 2.4 (L) 10/28/2018 1300   RBC 4.60 05/31/2019 1150  HGB 13.9 05/31/2019 1150   HGB 13.0 04/11/2014 0804   HCT 43.3 05/31/2019 1150   HCT 39.5 04/11/2014 0804   PLT 407 (H) 05/31/2019 1150   PLT 359 04/11/2014 0804   MCV 94.1 05/31/2019 1150   MCV 93.4 04/11/2014 0804   MCH 30.2 05/31/2019 1150   MCHC 32.1 05/31/2019 1150   RDW 15.0 05/31/2019 1150   RDW 13.1 04/11/2014 0804   LYMPHSABS 1.3 05/31/2019 1150   LYMPHSABS 1.8 04/11/2014 0804   MONOABS 0.8 05/31/2019 1150   MONOABS 0.5 04/11/2014 0804   EOSABS 0.5 05/31/2019 1150   EOSABS 0.2 04/11/2014 0804   BASOSABS 0.1 05/31/2019 1150   BASOSABS 0.0 04/11/2014 0804    CMP     Component Value Date/Time   NA 139 05/31/2019 1150   NA 142 04/11/2014 0804   K 4.0 05/31/2019 1150   K 4.0 04/11/2014 0804   CL 100 05/31/2019 1150   CL 105 08/06/2012 1313   CO2 30 05/31/2019 1150   CO2 28 04/11/2014 0804   GLUCOSE 155 (H) 05/31/2019 1150   GLUCOSE 82 04/11/2014 0804   GLUCOSE 100 (H) 08/06/2012 1313   BUN 14 05/31/2019 1150   BUN 13.4 04/11/2014 0804   CREATININE 1.14 (H) 05/31/2019 1150   CREATININE 0.8 04/11/2014 0804   CALCIUM 8.9 05/31/2019 1150   CALCIUM 9.1 04/11/2014 0804   PROT 7.2 05/31/2019 1150   PROT 6.6 04/11/2014 0804   ALBUMIN 3.7 05/31/2019 1150   ALBUMIN 3.7 04/11/2014 0804   AST 26 05/31/2019 1150   AST 16 04/11/2014 0804   ALT 17 05/31/2019 1150   ALT 16 04/11/2014 0804   ALKPHOS 104 05/31/2019 1150   ALKPHOS 91 04/11/2014 0804   BILITOT 0.3 05/31/2019 1150   BILITOT 0.48 04/11/2014 0804   GFRNONAA 50 (L) 05/31/2019 1150   GFRAA 58 (L) 05/31/2019 1150          ASSESSMENT and PLAN:   Breast cancer of lower-outer quadrant of left female breast Left breast cancer invasive ductal carcinoma 2.5 cm in size grade 1, ER 99%, PR 41%, Ki-67  11%, HER-2 negative T2 N0 M0 stage II a status post neoadjuvant chemotherapy followed by surgery February 2011and wason antiestrogen therapy with Femara from January 2011-July 2017  Treatment : 1. Palliative radiation to T11 vertebral body and the pectoralis muscle: 12/20/17-12/26/17 2. Ibrance with letrozole along with Delton See or Zometadiscontinued July 2020 due to progression Patient could not tolerate Faslodex because of intense bone pain as well as hypotension.  04/05/2019:Guardant 360:PIK3CA mutation (alpelisib), NF1 S2 467 (everolimus) ARID1A S1791 (response to neratinib, olaparib, rucaparib, talazoparib), T p53 and SMAD4 Q534, TMB 35.41 Based on these results, it appears that she could respond to alpelisib as well as everolimus and olaparib as well as possibly immunotherapy.  CT CAP 04/30/2019: There is increased now nearly confluent left-sided pleural nodularity, increase in size of pericardial nodule 1.4 cm, enlargement of left axillary lymph nodes 1.4 cm, enlargement of left retroperitoneal lymph node 1.5 cm, no significant change in the sclerotic bone metastases.  Radiation fibrosis in the lungs, loculated left pleural effusion with pleural thickening and nodularity. ---------------------------------------------------------------------------------------------------------------------- Current treatment: Halaven started 10/27/2018, completed 9 cycles as of 04/12/2019 discontinued for progression of disease.   Alpelisib with Faslodex (250 mg because of prior intolerance) started 05/03/19  Premedication with Zofran and pain medication like Ultram or Percocet. Tolerating treatment moderately well.  Will continue. 1. Metastatic cancer related pain: will get plain films to r/o fracture.  She will continue the fentanyl patch and percocet for now.  Should pain worsen, may need to do CT chest.   2.  Weight loss: placed nutrition referral.  Nevin Bloodgood and I talked about PO intake and increased protein  in her diet.    Due to her pain and weight loss, she will return in 2 weeks for labs and f/u with Dr. Lindi Adie.       Orders Placed This Encounter  Procedures  . DG Chest 2 View    Standing Status:   Future    Number of Occurrences:   1    Standing Expiration Date:   05/30/2020    Order Specific Question:   Reason for Exam (SYMPTOM  OR DIAGNOSIS REQUIRED)    Answer:   left chest wall pain, h/o pleural effusion, metastatic breast cancer    Order Specific Question:   Preferred imaging location?    Answer:   [facility]    Order Specific Question:   Radiology Contrast Protocol - do NOT remove file path    Answer:   \\[email or web address]s.pdf  . DG Ribs Unilateral Left    Standing Status:   Future    Number of Occurrences:   1    Standing Expiration Date:   07/28/2020    Order Specific Question:   Reason for Exam (SYMPTOM  OR DIAGNOSIS REQUIRED)    Answer:   increased left chest wall pain, metastatic breast cancer, please evaluate    Order Specific Question:   Preferred imaging location?    Answer:   [facility]    Order Specific Question:   Radiology Contrast Protocol - do NOT remove file path    Answer:   \\[email or web address]\[email or web address]\DXFluoroContrastProtocols.pdf  . Ambulatory referral to Nutrition and Diabetic E    Referral Priority:   Urgent    Referral Type:   Consultation    Referral Reason:   Specialty Services Required    Number of Visits Requested:   1    All questions were answered. The patient knows to call the clinic with any problems, questions or concerns. We can certainly see the patient much sooner if necessary. This note was electronically signed.  Total encounter time: 30 minutes*  Wilber Bihari, NP 05/31/19 3:47 PM Medical Oncology and Hematology [facility] [place], [place] [address] Tel. [phone]    Fax. [phone]  *Total Encounter Time as defined by the Centers for  Medicare and Medicaid Services includes, in addition to the face-to-face time of a patient visit (documented in the note above) non-face-to-face time: obtaining and reviewing outside history, ordering and reviewing medications, tests or procedures, care coordination (communications with other health care professionals or caregivers) and documentation in the medical record.

## 2019-06-01 ENCOUNTER — Telehealth: Payer: Self-pay | Admitting: Hematology and Oncology

## 2019-06-01 ENCOUNTER — Telehealth: Payer: Self-pay | Admitting: *Deleted

## 2019-06-01 NOTE — Telephone Encounter (Signed)
Scheduled appt per 2/15 sch message - pt request appt to be on the same day as next follow

## 2019-06-01 NOTE — Telephone Encounter (Signed)
Per Wilber Bihari, NP, called to make pt aware that x-ray was unchanged from prior x-ray. Pt still has 7th rib fracture which was from previous x-ray. Pt was appreciative and verbalized understanding.

## 2019-06-11 ENCOUNTER — Other Ambulatory Visit: Payer: Self-pay | Admitting: *Deleted

## 2019-06-11 DIAGNOSIS — C50512 Malignant neoplasm of lower-outer quadrant of left female breast: Secondary | ICD-10-CM

## 2019-06-13 NOTE — Progress Notes (Signed)
Patient Care Team: [name], [place] as PCP - General (Nurse Practitioner)  DIAGNOSIS:    ICD-10-CM   1. Malignant neoplasm of lower-outer quadrant of left breast of female, estrogen receptor positive ([facility])  C50.512    Z17.0     SUMMARY OF ONCOLOGIC HISTORY: Oncology History  Breast cancer of lower-outer quadrant of left female breast ([place])  12/13/2008 Initial Diagnosis   Left breast biopsy: Invasive ductal carcinoma ER 90% percent, PR 41%, Ki-67 11%, HER-2 negative ratio 1, Oncotype DX score 23, ROR15%   01/27/2009 - 03/10/2009 Neo-Adjuvant Chemotherapy   Neoadjuvant FEC 4; participant in the "bed sheets" study.   05/12/2009 -  Anti-estrogen oral therapy   Femara 2.5 mg daily   05/22/2009 Surgery   Bilateral mastectomy stage IIB T2 N0 M0 IDC left breast grade 1, 2.5 cm, ER 99%, PR 41%, Ki-67 11%, HER-2 negative   12/10/2009 Surgery   Breast reconstruction surgery   11/14/2017 Relapse/Recurrence   Mid back pain: MRI revealed T11 severe pathologic fracture, abnormal signal T10, T11 and T12, moderate canal stenosis at T11   12/03/2017 PET scan   Hypermetabolic metastatic breast cancer involving thoracic and upper abdominal lymph nodes, spine and ribs. There may be hypermetabolic adenopathy in the left neck; Hypermetabolic lymph node versus intramuscular metastasis involving the medial left pectoralis musculature Pathologic T11 fracture     12/11/2017 Procedure   Biopsy of T11 vertebral body lytic lesion: Metastatic breast adenocarcinoma ER positive, PR negative   12/19/2017 - 12/26/2017 Radiation Therapy   Palliative XRT to T 11   01/12/2018 - 10/26/2018 Anti-estrogen oral therapy   Patient could not tolerate Faslodex because of severe hypotension and severe pain in the legs after injections.  Ibrance with letrozole starting 01/12/2018   10/27/2018 -  Chemotherapy   Palliative chemotherapy with Halaven   Malignant neoplasm of lower-outer quadrant of left breast of female, estrogen  receptor positive ([place])  10/21/2018 Initial Diagnosis   Malignant neoplasm of lower-outer quadrant of left breast of female, estrogen receptor positive ([place])   10/27/2018 - 05/02/2019 Chemotherapy   The patient had eriBULin mesylate (HALAVEN) 1.65 mg in sodium chloride 0.9 % 100 mL chemo infusion, 1.1 mg/m2 = 2.1 mg, Intravenous,  Once, 9 of 10 cycles Dose modification: 1.1 mg/m2 (original dose 1.4 mg/m2, Cycle 1, Reason: Other (see comments), Comment: CrCL < 50) Administration: 1.65 mg (10/27/2018), 1.65 mg (11/03/2018), 1.65 mg (11/17/2018), 1.65 mg (11/24/2018), 1.65 mg (12/08/2018), 1.65 mg (12/15/2018), 1.65 mg (12/29/2018), 1.65 mg (01/18/2019), 1.65 mg (02/08/2019), 1.65 mg (03/01/2019), 1.65 mg (03/22/2019), 1.65 mg (04/12/2019)  for chemotherapy treatment.    11/17/2018 - 12/03/2018 Radiation Therapy   Palliative radiation to the T5 location of the spine and right femur, 35 Gy in 14 fractions   04/05/2019 Miscellaneous   Guardant 360:PIK3CA mutation (alpelisib), NF1 S2 467 (everolimus) ARID1A S1791 (response to neratinib, olaparib, rucaparib, talazoparib), T p53 and SMAD4 Q534, TMB 35.41 mutations /MB, no evidence of MSI high     CHIEF COMPLIANT: Follow-up of metastatic breast cancer  INTERVAL HISTORY: Elizabeth [name] is a 64 y.o. with above-mentioned history of metastatic breast cancerwhohas had recurrent pleural effusions.Sheis currently on treatment with Alpelisib and Faslodex.CXR on 05/31/19 showed a fracture of the left seventh rib, no change from prior studies. She presents to the clinic todayfor a toxicity check.  She has had profound diarrhea since Friday and nausea and vomiting.  The diarrhea did not stop until last night.  She was not eating or drinking much  because of this diarrhea.  It happened last week as well but it only lasted for 1 day.  ALLERGIES:  is allergic to shellfish allergy and sulfa antibiotics.  MEDICATIONS:  Current Outpatient Medications  Medication Sig Dispense  Refill  . alpelisib (PIQRAY 300MG DAILY DOSE) 2 x 150 MG Therapy Pack Take two 164m tablets with food at the same time daily. Swallow whole, do not crush, chew, or split. 60 each 3  . Biotin 10000 MCG TABS Take 10,000 mcg by mouth daily.     . Cholecalciferol (VITAMIN D PO) Take 5,000 Units by mouth daily.     . DULoxetine (CYMBALTA) 30 MG capsule TAKE 1 CAPSULE (30 MG TOTAL) BY MOUTH 2 (TWO) TIMES DAILY. 180 capsule 2  . fentaNYL (DURAGESIC) 25 MCG/HR Place 1 patch onto the skin every 3 (three) days. 10 patch 0  . fluconazole (DIFLUCAN) 100 MG tablet Take 1 tablet (100 mg total) by mouth daily. 3 tablet 0  . gabapentin (NEURONTIN) 300 MG capsule Take 1 capsule (300 mg total) by mouth 2 (two) times daily. 180 capsule 2  . KLOR-CON M20 20 MEQ tablet TAKE 1 TABLET BY MOUTH EVERY DAY 90 tablet 0  . letrozole (FEMARA) 2.5 MG tablet Take 1 tablet (2.5 mg total) by mouth daily. 90 tablet 3  . levothyroxine (SYNTHROID, LEVOTHROID) 25 MCG tablet Take 25 mcg by mouth daily before breakfast.     . loperamide (IMODIUM) 2 MG capsule 1 to 2 PO QID prn diarrhea 30 capsule 22  . magic mouthwash w/lidocaine SOLN Take 5 mLs by mouth 3 (three) times daily as needed for mouth pain. 100 mL 0  . Multiple Vitamin (MULTIVITAMIN) tablet Take 1 tablet by mouth daily.    .Marland Kitchenomeprazole (PRILOSEC) 20 MG capsule Take 1 capsule (20 mg total) by mouth daily. 90 capsule 0  . ondansetron (ZOFRAN) 8 MG tablet Take 1 tablet (8 mg total) by mouth every 8 (eight) hours as needed for nausea. 30 tablet 3  . oxyCODONE-acetaminophen (PERCOCET) 10-325 MG tablet Take 1 tablet by mouth every 8 (eight) hours as needed for pain. 90 tablet 0  . prednisoLONE acetate (PRED FORTE) 1 % ophthalmic suspension Place 1 drop into both eyes daily.    . prochlorperazine (COMPAZINE) 5 MG tablet Take 1 tablet (5 mg total) by mouth every 6 (six) hours as needed for nausea or vomiting. 45 tablet 2  . simethicone (GAS-X) 80 MG chewable tablet Chew 1 tablet  (80 mg total) by mouth every 6 (six) hours as needed for flatulence. 120 tablet 2  . traMADol (ULTRAM) 50 MG tablet Take 1 tablet (50 mg total) by mouth every 6 (six) hours as needed. 120 tablet 0   No current facility-administered medications for this visit.    PHYSICAL EXAMINATION: ECOG PERFORMANCE STATUS: 1 - Symptomatic but completely ambulatory  Vitals:   06/14/19 1000  BP: 105/81  Pulse: (!) 119  Resp: 18  Temp: 98.2 F (36.8 C)  SpO2: 99%   Filed Weights   06/14/19 1000  Weight: 90 lb 14.4 oz (41.2 kg)    LABORATORY DATA:  I have reviewed the data as listed CMP Latest Ref Rng & Units 05/31/2019 05/17/2019 05/03/2019  Glucose 70 - 99 mg/dL 155(H) 134(H) 102(H)  BUN 8 - 23 mg/dL '14 14 13  ' Creatinine 0.44 - 1.00 mg/dL 1.14(H) 0.92 0.84  Sodium 135 - 145 mmol/L 139 142 140  Potassium 3.5 - 5.1 mmol/L 4.0 3.9 4.1  Chloride 98 - 111  mmol/L 100 104 104  CO2 22 - 32 mmol/L '30 27 28  ' Calcium 8.9 - 10.3 mg/dL 8.9 9.2 8.8(L)  Total Protein 6.5 - 8.1 g/dL 7.2 7.0 6.7  Total Bilirubin 0.3 - 1.2 mg/dL 0.3 0.3 0.2(L)  Alkaline Phos 38 - 126 U/L 104 142(H) 143(H)  AST 15 - 41 U/L 26 39 32  ALT 0 - 44 U/L '17 18 15    ' Lab Results  Component Value Date   WBC 6.3 06/14/2019   HGB 13.1 06/14/2019   HCT 39.7 06/14/2019   MCV 91.7 06/14/2019   PLT 288 06/14/2019   NEUTROABS 4.2 06/14/2019    ASSESSMENT & PLAN:  Malignant neoplasm of lower-outer quadrant of left breast of female, estrogen receptor positive ([facility]) Left breast cancer invasive ductal carcinoma 2.5 cm in size grade 1, ER 99%, PR 41%, Ki-67 11%, HER-2 negative T2 N0 M0 stage II a status post neoadjuvant chemotherapy followed by surgery February 2011and wason antiestrogen therapy with Femara from January 2011-July 2017  Treatment : 1. Palliative radiation to T11 vertebral body and the pectoralis muscle: 12/20/17-12/26/17 2. Ibrance with letrozole along with Delton See or Zometadiscontinued July 2020 due to  progression Patient could not tolerate Faslodex because of intense bone pain as well as hypotension. 3. Halaven started 10/27/2018,completed 9 cycles as of 04/12/2019 discontinued for progression of disease.  04/05/2019:Guardant 360:PIK3CA mutation (alpelisib), NF1 S2 467 (everolimus) ARID1A S1791 (response to neratinib, olaparib, rucaparib, talazoparib), T p53 and SMAD4 Q534, TMB 35.41 Based on these results, it appears that she could respond to alpelisib as well as everolimus and olaparib as well as possibly immunotherapy.  CT CAP 04/30/2019: There is increased now nearly confluent left-sided pleural nodularity, increase in size of pericardial nodule 1.4 cm, enlargement of left axillary lymph nodes 1.4 cm, enlargement of left retroperitoneal lymph node 1.5 cm, no significant change in the sclerotic bone metastases. Radiation fibrosis in the lungs, loculated left pleural effusion with pleural thickening and nodularity. ---------------------------------------------------------------------------------------------------------------------- Current treatment: Alpelisib with Faslodex (250 mg because of prior intolerance) started 05/03/19 Alpelisib toxicities: 1.  Severe diarrhea: I gave her instructions on how to take Imodium.  She will start taking it from today onwards.  We will administer her IV fluids normal saline 1 L today.  Also instructed her to take probiotics. 2.  Nausea: Instructed her to take ondansetron. 3.  Decreased appetite: I sent a prescription for dexamethasone.  Bone pain due to metastatic breast cancer: Continue with fentanyl and Percocets. Profound weight loss: Trying dexamethasone today.  She is seeing a dietitian today.  Return to clinic in 2 weeks for follow-up    No orders of the defined types were placed in this encounter.  The patient has a good understanding of the overall plan. she agrees with it. she will call with any problems that may develop before the next  visit here.  Total time spent: 30 mins including face to face time and time spent for planning, charting and coordination of care  Nicholas Lose, MD 06/14/2019  I, Cloyde Reams Dorshimer, am acting as scribe for Dr. Nicholas Lose.  I have reviewed the above documentation for accuracy and completeness, and I agree with the above.

## 2019-06-14 ENCOUNTER — Inpatient Hospital Stay: Payer: Medicare Other

## 2019-06-14 ENCOUNTER — Other Ambulatory Visit: Payer: Self-pay

## 2019-06-14 ENCOUNTER — Ambulatory Visit: Payer: Medicare Other

## 2019-06-14 ENCOUNTER — Inpatient Hospital Stay (HOSPITAL_BASED_OUTPATIENT_CLINIC_OR_DEPARTMENT_OTHER): Payer: Medicare Other | Admitting: Hematology and Oncology

## 2019-06-14 ENCOUNTER — Inpatient Hospital Stay: Payer: Medicare Other | Admitting: Nutrition

## 2019-06-14 ENCOUNTER — Inpatient Hospital Stay: Payer: Medicare Other | Attending: Hematology and Oncology

## 2019-06-14 DIAGNOSIS — Z79811 Long term (current) use of aromatase inhibitors: Secondary | ICD-10-CM | POA: Diagnosis not present

## 2019-06-14 DIAGNOSIS — Z9221 Personal history of antineoplastic chemotherapy: Secondary | ICD-10-CM | POA: Insufficient documentation

## 2019-06-14 DIAGNOSIS — C50512 Malignant neoplasm of lower-outer quadrant of left female breast: Secondary | ICD-10-CM

## 2019-06-14 DIAGNOSIS — G893 Neoplasm related pain (acute) (chronic): Secondary | ICD-10-CM | POA: Insufficient documentation

## 2019-06-14 DIAGNOSIS — Z79818 Long term (current) use of other agents affecting estrogen receptors and estrogen levels: Secondary | ICD-10-CM | POA: Insufficient documentation

## 2019-06-14 DIAGNOSIS — R112 Nausea with vomiting, unspecified: Secondary | ICD-10-CM | POA: Insufficient documentation

## 2019-06-14 DIAGNOSIS — R197 Diarrhea, unspecified: Secondary | ICD-10-CM | POA: Diagnosis not present

## 2019-06-14 DIAGNOSIS — R634 Abnormal weight loss: Secondary | ICD-10-CM | POA: Diagnosis not present

## 2019-06-14 DIAGNOSIS — Z79899 Other long term (current) drug therapy: Secondary | ICD-10-CM | POA: Insufficient documentation

## 2019-06-14 DIAGNOSIS — I890 Lymphedema, not elsewhere classified: Secondary | ICD-10-CM | POA: Insufficient documentation

## 2019-06-14 DIAGNOSIS — E860 Dehydration: Secondary | ICD-10-CM | POA: Insufficient documentation

## 2019-06-14 DIAGNOSIS — Z923 Personal history of irradiation: Secondary | ICD-10-CM | POA: Diagnosis not present

## 2019-06-14 DIAGNOSIS — Z9013 Acquired absence of bilateral breasts and nipples: Secondary | ICD-10-CM | POA: Diagnosis not present

## 2019-06-14 DIAGNOSIS — L989 Disorder of the skin and subcutaneous tissue, unspecified: Secondary | ICD-10-CM | POA: Diagnosis not present

## 2019-06-14 DIAGNOSIS — C7951 Secondary malignant neoplasm of bone: Secondary | ICD-10-CM | POA: Diagnosis present

## 2019-06-14 DIAGNOSIS — Z170 Estrogen receptor positive status [ER+]: Secondary | ICD-10-CM

## 2019-06-14 DIAGNOSIS — Z95828 Presence of other vascular implants and grafts: Secondary | ICD-10-CM

## 2019-06-14 DIAGNOSIS — C9000 Multiple myeloma not having achieved remission: Secondary | ICD-10-CM

## 2019-06-14 LAB — CMP (CANCER CENTER ONLY)
ALT: 18 U/L (ref 0–44)
AST: 29 U/L (ref 15–41)
Albumin: 3.6 g/dL (ref 3.5–5.0)
Alkaline Phosphatase: 93 U/L (ref 38–126)
Anion gap: 10 (ref 5–15)
BUN: 14 mg/dL (ref 8–23)
CO2: 28 mmol/L (ref 22–32)
Calcium: 8.8 mg/dL — ABNORMAL LOW (ref 8.9–10.3)
Chloride: 102 mmol/L (ref 98–111)
Creatinine: 1.04 mg/dL — ABNORMAL HIGH (ref 0.44–1.00)
GFR, Est AFR Am: >60 mL/min (ref >60)
GFR, Estimated: 56 mL/min — ABNORMAL LOW (ref >60)
Glucose, Bld: 117 mg/dL — ABNORMAL HIGH (ref 70–99)
Potassium: 3.4 mmol/L — ABNORMAL LOW (ref 3.5–5.1)
Sodium: 140 mmol/L (ref 135–145)
Total Bilirubin: 0.7 mg/dL (ref 0.3–1.2)
Total Protein: 6.8 g/dL (ref 6.5–8.1)

## 2019-06-14 LAB — CBC WITH DIFFERENTIAL (CANCER CENTER ONLY)
Abs Immature Granulocytes: 0.02 10*3/uL (ref 0.00–0.07)
Basophils Absolute: 0.1 10*3/uL (ref 0.0–0.1)
Basophils Relative: 1 %
Eosinophils Absolute: 0.4 10*3/uL (ref 0.0–0.5)
Eosinophils Relative: 7 %
HCT: 39.7 % (ref 36.0–46.0)
Hemoglobin: 13.1 g/dL (ref 12.0–15.0)
Immature Granulocytes: 0 %
Lymphocytes Relative: 14 %
Lymphs Abs: 0.9 10*3/uL (ref 0.7–4.0)
MCH: 30.3 pg (ref 26.0–34.0)
MCHC: 33 g/dL (ref 30.0–36.0)
MCV: 91.7 fL (ref 80.0–100.0)
Monocytes Absolute: 0.7 10*3/uL (ref 0.1–1.0)
Monocytes Relative: 11 %
Neutro Abs: 4.2 10*3/uL (ref 1.7–7.7)
Neutrophils Relative %: 67 %
Platelet Count: 288 10*3/uL (ref 150–400)
RBC: 4.33 MIL/uL (ref 3.87–5.11)
RDW: 14.6 % (ref 11.5–15.5)
WBC Count: 6.3 10*3/uL (ref 4.0–10.5)
nRBC: 0 % (ref 0.0–0.2)

## 2019-06-14 MED ORDER — SODIUM CHLORIDE 0.9 % IV SOLN
INTRAVENOUS | Status: AC
  Filled 2019-06-14: qty 250

## 2019-06-14 MED ORDER — SODIUM CHLORIDE 0.9% FLUSH
10.0000 mL | INTRAVENOUS | Status: DC | PRN
  Administered 2019-06-14: 10 mL via INTRAVENOUS
  Filled 2019-06-14: qty 10

## 2019-06-14 MED ORDER — SODIUM CHLORIDE 0.9 % IV SOLN
Freq: Once | INTRAVENOUS | Status: AC
  Filled 2019-06-14: qty 250

## 2019-06-14 MED ORDER — DEXAMETHASONE 1 MG PO TABS: 1.0000 mg | ORAL_TABLET | Freq: Every day | ORAL | 3 refills | Status: DC

## 2019-06-14 NOTE — Assessment & Plan Note (Signed)
Left breast cancer invasive ductal carcinoma 2.5 cm in size grade 1, ER 99%, PR 41%, Ki-67 11%, HER-2 negative T2 N0 M0 stage II a status post neoadjuvant chemotherapy followed by surgery February 2011and wason antiestrogen therapy with Femara from January 2011-July 2017  Treatment : 1. Palliative radiation to T11 vertebral body and the pectoralis muscle: 12/20/17-12/26/17 2. Ibrance with letrozole along with Delton See or Zometadiscontinued July 2020 due to progression Patient could not tolerate Faslodex because of intense bone pain as well as hypotension. 3. Halaven started 10/27/2018,completed 9 cycles as of 04/12/2019 discontinued for progression of disease.  04/05/2019:Guardant 360:PIK3CA mutation (alpelisib), NF1 S2 467 (everolimus) ARID1A S1791 (response to neratinib, olaparib, rucaparib, talazoparib), T p53 and SMAD4 Q534, TMB 35.41 Based on these results, it appears that she could respond to alpelisib as well as everolimus and olaparib as well as possibly immunotherapy.  CT CAP 04/30/2019: There is increased now nearly confluent left-sided pleural nodularity, increase in size of pericardial nodule 1.4 cm, enlargement of left axillary lymph nodes 1.4 cm, enlargement of left retroperitoneal lymph node 1.5 cm, no significant change in the sclerotic bone metastases. Radiation fibrosis in the lungs, loculated left pleural effusion with pleural thickening and nodularity. ---------------------------------------------------------------------------------------------------------------------- Current treatment: Alpelisib with Faslodex (250 mg because of prior intolerance) started 05/03/19 Alpelisib toxicities:  Bone pain due to metastatic breast cancer: Continue with fentanyl and Percocets. Profound weight loss  Return to clinic in 1 month for follow-up

## 2019-06-14 NOTE — Patient Instructions (Signed)

## 2019-06-14 NOTE — Patient Instructions (Signed)

## 2019-06-14 NOTE — Progress Notes (Signed)
Patient was scheduled for a flush appointment. I left her accessed and made Dr Lindi Adie nurse aware that if patient didn't receive fluids today for her to de-access.

## 2019-06-14 NOTE — Progress Notes (Signed)
64 year old female diagnosed with metastatic breast cancer.  She is a patient of Dr. Sonny Dandy receiving Joylene Draft.  Past medical history includes migraines, hypothyroidism, hypercholesterolemia and arthritis.  Medications include Picqray, biotin, vitamin D, Cymbalta, Femara, Synthroid, Imodium, Magic mouthwash, multivitamin, Prilosec, Zofran, Compazine, and Gas-X.  Height: 5 feet 3 inches. Weight: 90.9 pounds. Usual body weight: 125 pounds in March 2020. BMI: 16.1.  Patient is having multiple nutrition impact symptoms including mouth sores, nausea, fatigue, and diarrhea. Reports her appetite comes and goes depending on nausea.  She also has taste alterations. She shares that she has been instructed by MD to take Imodium and a probiotic.  Nutrition diagnosis:  Unintended weight loss related to metastatic cancer as evidenced by 27% weight loss over 1 year which is significant.  Intervention: Educated patient on strategies for smaller more frequent meals and snacks. Encouraged dry, bland foods when experiencing nausea and encouraged her to take nausea medications. Reviewed nutrition impact strategies for mouth sores including consuming soft high-protein foods with very little spice or seasoning. Enforced importance of bowel regimen including Imodium as needed for diarrhea per MD.  Encouraged patient add yogurt to oral intake. Recommended oral nutrition supplements in very small amounts throughout the day.  She will evaluate her tolerance of these. Questions were answered.  Teach back method used.  Contact information given. I provided fact sheets and contact information.  Monitoring, evaluation, goals: Patient will tolerate increased oral intake while minimizing nutrition impact symptoms to minimize further weight loss.  Next visit: Monday, March 15 during infusion.  **Disclaimer: This note was dictated with voice recognition software. Similar sounding words can inadvertently be transcribed  and this note may contain transcription errors which may not have been corrected upon publication of note.**

## 2019-06-16 ENCOUNTER — Telehealth: Payer: Self-pay | Admitting: *Deleted

## 2019-06-16 NOTE — Telephone Encounter (Signed)
Received call from pt requesting advice of MD on taking Piqray.  Per MD, he reviewed with pt on how/when to take immodium.  Pt to re-start the [place] tomorrow at her normal dosage of two capsules (300 mg total) daily.  If the pt continues to experience diarrhea with the two capsules plus immodium then her dosage will need to be decreased to one capsule (150 mg) of Piqray daily.  Pt verbalized understanding.

## 2019-06-17 ENCOUNTER — Telehealth: Payer: Self-pay | Admitting: *Deleted

## 2019-06-17 NOTE — Telephone Encounter (Signed)
Received call from pt stating she is experiencing increased shortness of breath since Monday 06/14/19.  Pt also states she is also experiencing swelling in her left arm/hand since Monday as well.  Pt denies any recent trauma, injury, redness, or increased warmth.  Per MD pt to be seen by [facility] for evaluation of SHOB and left arm swelling.  Learta Codding, RN notified and high priority message sent to scheduling.

## 2019-06-18 ENCOUNTER — Other Ambulatory Visit: Payer: Self-pay

## 2019-06-18 ENCOUNTER — Ambulatory Visit (HOSPITAL_COMMUNITY)
Admission: RE | Admit: 2019-06-18 | Discharge: 2019-06-18 | Disposition: A | Discharge status: Home / Self Care | Payer: Medicare Other | Source: Ambulatory Visit | Attending: Medical | Admitting: Medical

## 2019-06-18 ENCOUNTER — Inpatient Hospital Stay (HOSPITAL_BASED_OUTPATIENT_CLINIC_OR_DEPARTMENT_OTHER): Payer: Medicare Other | Admitting: Medical

## 2019-06-18 VITALS — BP 132/89 | HR 108 | Temp 98.5°F | Resp 18 | Ht 63.0 in | Wt 96.2 lb

## 2019-06-18 DIAGNOSIS — J90 Pleural effusion, not elsewhere classified: Secondary | ICD-10-CM

## 2019-06-18 DIAGNOSIS — C50512 Malignant neoplasm of lower-outer quadrant of left female breast: Secondary | ICD-10-CM | POA: Diagnosis not present

## 2019-06-18 DIAGNOSIS — R600 Localized edema: Secondary | ICD-10-CM

## 2019-06-18 DIAGNOSIS — Z170 Estrogen receptor positive status [ER+]: Secondary | ICD-10-CM | POA: Diagnosis not present

## 2019-06-18 NOTE — Progress Notes (Signed)
Left lower extremity venous duplex has been completed. Preliminary results can be found in CV Proc through chart review.  Results were given to Sandi Mealy PA.  06/18/19 11:16 AM Carlos Levering RVT

## 2019-06-18 NOTE — Progress Notes (Signed)
Pt transferred to radiology for imaging with spouse, will return to [facility] afterwards to discuss results.

## 2019-06-18 NOTE — Patient Instructions (Signed)

## 2019-06-18 NOTE — Progress Notes (Signed)
[facility] Progress Note   Elizabeth [name] [id number] Jan 13, 1956 64 y.o.  Elizabeth [name] is managed by Dr. Nicholas Lose  Actively treated with chemotherapy/immunotherapy/hormonal therapy: yes  Current therapy: Faslodex, letrozole and Piqray  Last treated: 05/31/2019 Faslodex (treatment 7)  Next scheduled appointment with provider: 06/28/2019  Assessment: Plan:    Malignant neoplasm of lower-outer quadrant of left breast of female, estrogen receptor positive ([place]) - Plan: VAS [place] UPPER EXTREMITY VENOUS DUPLEX, DG Chest Left Decubitus  Pleural effusion, left - Plan: DG Chest Left Decubitus  Edema of upper extremity - Plan: VAS [place] UPPER EXTREMITY VENOUS DUPLEX   Metastatic ER positive malignant neoplasm of the left breast: The patient continues to be followed by Dr. Lindi Adie and is treated with Faslodex, letrozole and Piqray. She is scheduled to return on 03/15/20201.   H/O left pleural effusion: A CXR was completed today and returned showing:  FINDINGS: Single left-side-down lateral decubitus view of the chest demonstrates no significant layering of the left-sided pleural effusion which is predominately sub pulmonic.  There is mild extension into the fissure.  Overall volume appears similar to the most recent radiographs.  Underlying left basilar airspace disease does not appear significantly changed.  The right lung is clear. There is no right-sided pleural effusion or pneumothorax.  Right IJ Port-A-Cath extends to the level of the superior cavoatrial junction.  IMPRESSION: No significant layering of left-sided pleural effusion consistent with loculation.  Overall volume similar to recent prior studies.  Lymphedema of the left upper extremity: The patient was referred for a LUE Doppler US which returned showing:  Right:  No evidence of thrombosis in the subclavian.    Left:  No evidence of deep vein thrombosis in the upper extremity.  No evidence of  superficial vein thrombosis in the upper extremity.   She will be referred to the lymphedema clinic.  Please see After Visit Summary for patient specific instructions.  Future Appointments  Date Time Provider [place]  06/28/2019  8:45 AM [facility] LAB 4 [facility] None  06/28/2019  9:00 AM [facility] [place] None  06/28/2019  9:30 AM Nicholas Lose, MD [facility] None  06/28/2019 10:45 AM [facility] INFUSION [facility] None  06/28/2019 11:15 AM Karie Mainland, RD [facility] None  07/12/2019  8:45 AM [facility] LAB 2 [facility] None  07/12/2019  9:00 AM [facility] [place] FLUSH [facility] None  07/12/2019  9:30 AM Causey, Charlestine Massed, NP [facility] None  08/09/2019  8:30 AM [facility] LAB 3 [facility] None  08/09/2019  8:45 AM [facility] [place] FLUSH [facility] None  08/09/2019  9:15 AM Nicholas Lose, MD [facility] None  08/09/2019  9:30 AM [facility] [place] FLUSH [facility] None    Orders Placed This Encounter  Procedures  . DG Chest Left Decubitus  . VAS [place] UPPER EXTREMITY VENOUS DUPLEX       Subjective:   Patient ID:  Elizabeth [name] is a 64 y.o. (DOB 1956-02-11) female.  Chief Complaint:  Chief Complaint  Patient presents with  . Arm Swelling    Left    HPI Elizabeth [name]  Is a 64 y.o. female with a diagnosis of a metastatic ER positive malignant neoplasm of the left breast. The patient continues to be followed by Dr. Lindi Adie and is treated with Faslodex, letrozole and Piqray. She has a h/o left pleural effusion and reports no increase shortness of breath or cough. She was her on Monday for IVF's and subsequently developed swelling of her LUE. She denies  trauma or changes in activity.   Medications: I have reviewed the patient's current medications.  Allergies:  Allergies  Allergen Reactions  . Shellfish Allergy Anaphylaxis and Other (See Comments)    Allergist said she was "highly allergic to shellfish"  . Sulfa Antibiotics Rash and Other (See Comments)      Looks like severe sunburn    Past Medical History:  Diagnosis Date  . Arthritis   . Breast cancer ([place]) 2010   Left  . Chronic sinusitis   . Family history of adverse reaction to anesthesia    sister-nausea/vomiting  . History of chemotherapy 01/2009-03/2009  . Hypercholesteremia   . Hypothyroid    "inactive thyroid"  . Metastasis ([place]) 11/2017   LN  . Metastatic cancer to bone ([place]) 11/2017  . Migraines   . RSD (reflex sympathetic dystrophy)     Past Surgical History:  Procedure Laterality Date  . BREAST RECONSTRUCTION    . EYE SURGERY Bilateral 7/18, 10/18  . IR IMAGING GUIDED PORT INSERTION  10/28/2018  . IR INSTILL VIA CHEST TUBE AGENT FOR FIBRINOLYSIS INI DAY  10/09/2018  . IR PATIENT EVAL TECH 0-60 MINS  09/08/2018  . IR PERC PLEURAL DRAIN W/INDWELL CATH W/IMG GUIDE  08/05/2018  . IR RADIOLOGIST EVAL & MGMT  10/12/2018  . IR REMOVAL OF PLURAL CATH W/CUFF  10/26/2018  . KNEE ARTHROSCOPY Bilateral   . KNEE SURGERY Right    Two surgeries  . MASTECTOMY Bilateral 05/22/2009  . MASTECTOMY Bilateral 2011   restrictions on left arm-no labs/bloodpressure  . NASAL SINUS SURGERY    . PORTA CATH INSERTION  12/2008  . PORTA CATH REMOVAL  05/2009  . ROOT CANAL    . SHOULDER SURGERY Right   . TOTAL HIP ARTHROPLASTY Left 05/26/2017   Procedure: TOTAL HIP ARTHROPLASTY ANTERIOR APPROACH;  Surgeon: Frederik Pear, MD;  Location: [facility];  Service: Orthopedics;  Laterality: Left;    Family History  Problem Relation Age of Onset  . Hypertension Mother     Social History   Socioeconomic History  . Marital status: Married    Spouse name: Not on file  . Number of children: Not on file  . Years of education: Not on file  . Highest education level: Not on file  Occupational History  . Not on file  Tobacco Use  . Smoking status: Never Smoker  . Smokeless tobacco: Never Used  Substance and Sexual Activity  . Alcohol use: No  . Drug use: No  . Sexual activity: Yes    Birth  control/protection: Post-menopausal  Other Topics Concern  . Not on file  Social History Narrative  . Not on file   Social Determinants of Health   Financial Resource Strain:   . Difficulty of Paying Living Expenses: Not on file  Food Insecurity:   . Worried About Charity fundraiser in the Last Year: Not on file  . Ran Out of Food in the Last Year: Not on file  Transportation Needs:   . Lack of Transportation (Medical): Not on file  . Lack of Transportation (Non-Medical): Not on file  Physical Activity:   . Days of Exercise per Week: Not on file  . Minutes of Exercise per Session: Not on file  Stress:   . Feeling of Stress : Not on file  Social Connections:   . Frequency of Communication with Friends and Family: Not on file  . Frequency of Social Gatherings with Friends and Family: Not on file  .  Attends Religious Services: Not on file  . Active Member of Clubs or Organizations: Not on file  . Attends Archivist Meetings: Not on file  . Marital Status: Not on file  Intimate Partner Violence:   . Fear of Current or Ex-Partner: Not on file  . Emotionally Abused: Not on file  . Physically Abused: Not on file  . Sexually Abused: Not on file    Past Medical History, Surgical history, Social history, and Family history were reviewed and updated as appropriate.   Please see review of systems for further details on the patient's review from today.   Review of Systems:  Review of Systems  Constitutional: Negative for chills, diaphoresis and fever.  HENT: Negative for trouble swallowing.   Respiratory: Negative for cough, choking, chest tightness, shortness of breath, wheezing and stridor.   Cardiovascular: Negative for chest pain and palpitations.  Musculoskeletal:       LUE edema   Skin: Negative for rash and wound.    Objective:   Physical Exam:  BP 132/89 (BP Location: Right Arm, Patient Position: Sitting)   Pulse (!) 108   Temp 98.5 F (36.9 C)  (Temporal)   Resp 18   Ht 5\' 3"  (1.6 m)   Wt 96 lb 3.2 oz (43.6 kg)   SpO2 100%   BMI 17.04 kg/m  ECOG: 1  Physical Exam Constitutional:      General: She is not in acute distress.    Appearance: She is not diaphoretic.  HENT:     Head: Normocephalic and atraumatic.  Eyes:     General: No scleral icterus.       Right eye: No discharge.        Left eye: No discharge.  Cardiovascular:     Rate and Rhythm: Regular rhythm. Tachycardia present.     Heart sounds: Normal heart sounds. No murmur. No friction rub. No gallop.   Pulmonary:     Effort: Pulmonary effort is normal. No respiratory distress.     Breath sounds: Examination of the left-lower field reveals decreased breath sounds. Decreased breath sounds present. No wheezing or rales.    Musculoskeletal:        General: Swelling present. No tenderness, deformity or signs of injury.     Comments: LUE edema  Skin:    General: Skin is warm and dry.     Findings: No erythema or rash.  Neurological:     Mental Status: She is alert.     Coordination: Coordination normal.     Gait: Gait normal.  Psychiatric:        Mood and Affect: Mood normal.        Behavior: Behavior normal.        Thought Content: Thought content normal.        Judgment: Judgment normal.     Lab Review:     Component Value Date/Time   NA 140 06/14/2019 0940   NA 142 04/11/2014 0804   K 3.4 (L) 06/14/2019 0940   K 4.0 04/11/2014 0804   CL 102 06/14/2019 0940   CL 105 08/06/2012 1313   CO2 28 06/14/2019 0940   CO2 28 04/11/2014 0804   GLUCOSE 117 (H) 06/14/2019 0940   GLUCOSE 82 04/11/2014 0804   GLUCOSE 100 (H) 08/06/2012 1313   BUN 14 06/14/2019 0940   BUN 13.4 04/11/2014 0804   CREATININE 1.04 (H) 06/14/2019 0940   CREATININE 0.8 04/11/2014 0804   CALCIUM 8.8 (L) 06/14/2019 0940  CALCIUM 9.1 04/11/2014 0804   PROT 6.8 06/14/2019 0940   PROT 6.6 04/11/2014 0804   ALBUMIN 3.6 06/14/2019 0940   ALBUMIN 3.7 04/11/2014 0804   AST 29  06/14/2019 0940   AST 16 04/11/2014 0804   ALT 18 06/14/2019 0940   ALT 16 04/11/2014 0804   ALKPHOS 93 06/14/2019 0940   ALKPHOS 91 04/11/2014 0804   BILITOT 0.7 06/14/2019 0940   BILITOT 0.48 04/11/2014 0804   GFRNONAA 56 (L) 06/14/2019 0940   GFRAA >60 06/14/2019 0940       Component Value Date/Time   WBC 6.3 06/14/2019 0940   WBC 2.4 (L) 10/28/2018 1300   RBC 4.33 06/14/2019 0940   HGB 13.1 06/14/2019 0940   HGB 13.0 04/11/2014 0804   HCT 39.7 06/14/2019 0940   HCT 39.5 04/11/2014 0804   PLT 288 06/14/2019 0940   PLT 359 04/11/2014 0804   MCV 91.7 06/14/2019 0940   MCV 93.4 04/11/2014 0804   MCH 30.3 06/14/2019 0940   MCHC 33.0 06/14/2019 0940   RDW 14.6 06/14/2019 0940   RDW 13.1 04/11/2014 0804   LYMPHSABS 0.9 06/14/2019 0940   LYMPHSABS 1.8 04/11/2014 0804   MONOABS 0.7 06/14/2019 0940   MONOABS 0.5 04/11/2014 0804   EOSABS 0.4 06/14/2019 0940   EOSABS 0.2 04/11/2014 0804   BASOSABS 0.1 06/14/2019 0940   BASOSABS 0.0 04/11/2014 0804   -------------------------------  Imaging from last 24 hours (if applicable):  Radiology interpretation: DG Chest 2 View  Result Date: 05/31/2019 CLINICAL DATA:  Left chest wall pain. EXAM: CHEST - 2 VIEW COMPARISON:  01/12/2019 FINDINGS: Again noted is a moderate large left-sided pleural effusion. There is volume loss involving the left lower and left upper lobes. Airspace opacities are again noted throughout the left lung field which are similar to prior study. There is no pneumothorax. There is a well-positioned right-sided Port-A-Cath. Multiple pathologic compression fractures of the thoracic spine are again noted. IMPRESSION: 1. No acute abnormality. 2. Multiple chronic findings are again noted as detailed above, not significantly changed from recent prior studies. Electronically Signed   By: Constance Holster M.D.   On: 05/31/2019 20:55   DG Ribs Unilateral Left  Result Date: 05/31/2019 CLINICAL DATA:  Chest wall pain. EXAM:  LEFT RIBS - 2 VIEW COMPARISON:  CT dated April 30, 2019 FINDINGS: There is a pathologic fracture involving the posterior seventh rib. Additional sclerotic lesions are noted throughout the left ribs. Pathologic fractures are noted of several thoracic vertebral bodies. IMPRESSION: 1. There is a pathologic fracture of the left seventh rib. 2. Again noted are pathologic fractures of the thoracic spine, better visualized on prior CT. Electronically Signed   By: Constance Holster M.D.   On: 05/31/2019 20:57   DG Chest Left Decubitus  Result Date: 06/18/2019 CLINICAL DATA:  Metastatic breast cancer. Recurrent left pleural effusion. Shortness of breath. EXAM: CHEST - LEFT DECUBITUS COMPARISON:  Radiographs 05/31/2019. CT 04/30/2019. FINDINGS: Single left-side-down lateral decubitus view of the chest demonstrates no significant layering of the left-sided pleural effusion which is predominately sub pulmonic. There is mild extension into the fissure. Overall volume appears similar to the most recent radiographs. Underlying left basilar airspace disease does not appear significantly changed. The right lung is clear. There is no right-sided pleural effusion or pneumothorax. Right IJ Port-A-Cath extends to the level of the superior cavoatrial junction. IMPRESSION: No significant layering of left-sided pleural effusion consistent with loculation. Overall volume similar to recent prior studies. Electronically Signed  By: Richardean Sale M.D.   On: 06/18/2019 11:48   VAS [place] UPPER EXTREMITY VENOUS DUPLEX  Result Date: 06/18/2019 UPPER VENOUS STUDY  Indications: Swelling Risk Factors: Cancer. Limitations: Body habitus. Comparison Study: No prior studies. Performing Technologist: Oliver Hum RVT  Examination Guidelines: A complete evaluation includes B-mode imaging, spectral Doppler, color Doppler, and power Doppler as needed of all accessible portions of each vessel. Bilateral testing is considered an integral part of a  complete examination. Limited examinations for reoccurring indications may be performed as noted.  Right Findings: +----------+------------+---------+-----------+----------+-------+ RIGHT     CompressiblePhasicitySpontaneousPropertiesSummary +----------+------------+---------+-----------+----------+-------+ Subclavian    Full       Yes       Yes                      +----------+------------+---------+-----------+----------+-------+  Left Findings: +----------+------------+---------+-----------+----------+-------+ LEFT      CompressiblePhasicitySpontaneousPropertiesSummary +----------+------------+---------+-----------+----------+-------+ IJV           Full       Yes       Yes                      +----------+------------+---------+-----------+----------+-------+ Subclavian    Full       Yes       Yes                      +----------+------------+---------+-----------+----------+-------+ Axillary      Full       Yes       Yes                      +----------+------------+---------+-----------+----------+-------+ Brachial      Full       Yes       Yes                      +----------+------------+---------+-----------+----------+-------+ Radial        Full                                          +----------+------------+---------+-----------+----------+-------+ Ulnar         Full                                          +----------+------------+---------+-----------+----------+-------+ Cephalic      Full                                          +----------+------------+---------+-----------+----------+-------+ Basilic       Full                                          +----------+------------+---------+-----------+----------+-------+  Summary:  Right: No evidence of thrombosis in the subclavian.  Left: No evidence of deep vein thrombosis in the upper extremity. No evidence of superficial vein thrombosis in the upper extremity.  *See  table(s) above for measurements and observations.    Preliminary

## 2019-06-21 ENCOUNTER — Ambulatory Visit: Payer: Medicare Other

## 2019-06-23 ENCOUNTER — Ambulatory Visit: Payer: Medicare Other | Attending: Medical

## 2019-06-23 ENCOUNTER — Other Ambulatory Visit: Payer: Self-pay

## 2019-06-23 DIAGNOSIS — M25612 Stiffness of left shoulder, not elsewhere classified: Secondary | ICD-10-CM | POA: Diagnosis present

## 2019-06-23 DIAGNOSIS — M79602 Pain in left arm: Secondary | ICD-10-CM | POA: Diagnosis present

## 2019-06-23 DIAGNOSIS — I972 Postmastectomy lymphedema syndrome: Secondary | ICD-10-CM | POA: Insufficient documentation

## 2019-06-23 DIAGNOSIS — Z170 Estrogen receptor positive status [ER+]: Secondary | ICD-10-CM | POA: Insufficient documentation

## 2019-06-23 DIAGNOSIS — C50512 Malignant neoplasm of lower-outer quadrant of left female breast: Secondary | ICD-10-CM | POA: Diagnosis not present

## 2019-06-23 NOTE — Therapy (Signed)
[place], [place], [address] Phone: [phone]   Fax:  [phone]  Physical Therapy Evaluation  Patient Details  Name: Elizabeth [name] MRN: [id number] Date of Birth: 1956-02-06 Referring Provider (PT): Sandi Mealy PA   Encounter Date: 06/23/2019  PT End of Session - 06/23/19 1656    Visit Number  1    Number of Visits  13   if needed   Date for PT Re-Evaluation  07/28/19    PT Start Time  1610    PT Stop Time  1650    PT Time Calculation (min)  40 min    Activity Tolerance  Patient tolerated treatment well    Behavior During Therapy  [place] for tasks assessed/performed       Past Medical History:  Diagnosis Date  . Arthritis   . Breast cancer ([place]) 2010   Left  . Chronic sinusitis   . Family history of adverse reaction to anesthesia    sister-nausea/vomiting  . History of chemotherapy 01/2009-03/2009  . Hypercholesteremia   . Hypothyroid    "inactive thyroid"  . Metastasis ([place]) 11/2017   LN  . Metastatic cancer to bone ([place]) 11/2017  . Migraines   . RSD (reflex sympathetic dystrophy)     Past Surgical History:  Procedure Laterality Date  . BREAST RECONSTRUCTION    . EYE SURGERY Bilateral 7/18, 10/18  . IR IMAGING GUIDED PORT INSERTION  10/28/2018  . IR INSTILL VIA CHEST TUBE AGENT FOR FIBRINOLYSIS INI DAY  10/09/2018  . IR PATIENT EVAL TECH 0-60 MINS  09/08/2018  . IR PERC PLEURAL DRAIN W/INDWELL CATH W/IMG GUIDE  08/05/2018  . IR RADIOLOGIST EVAL & MGMT  10/12/2018  . IR REMOVAL OF PLURAL CATH W/CUFF  10/26/2018  . KNEE ARTHROSCOPY Bilateral   . KNEE SURGERY Right    Two surgeries  . MASTECTOMY Bilateral 05/22/2009  . MASTECTOMY Bilateral 2011   restrictions on left arm-no labs/bloodpressure  . NASAL SINUS SURGERY    . PORTA CATH INSERTION  12/2008  . PORTA CATH REMOVAL  05/2009  . ROOT CANAL    . SHOULDER SURGERY Right   . TOTAL HIP ARTHROPLASTY Left 05/26/2017   Procedure: TOTAL  HIP ARTHROPLASTY ANTERIOR APPROACH;  Surgeon: Frederik Pear, MD;  Location: [facility];  Service: Orthopedics;  Laterality: Left;    There were no vitals filed for this visit.   Subjective Assessment - 06/23/19 1614    Subjective  Pt states that she on 06/14/2019 she was at chemotherapy and had diarrhea for a few days. She was really dehydrated and was given fluids through her port. She states that on Wednesday she started to notice swelling in her arm when her daughter came over and noticed swelling in her LUE.    Pertinent History  Metastatic breast cancer with 1 lymph node removed 10 years ago. Pt had radiation in 01/2019 on her L upper quadrant, R hip and in her L cervical spine. Bilateral mastectomy stage IIB T2 N0 M0 IDC left breast grade 1, 2.5 cm, ER 99%, PR 41%, Ki-67 11%, HER-2 negative    Patient Stated Goals  I want to get the swelling down.    Currently in Pain?  Yes    Pain Location  Hand    Pain Orientation  Left    Pain Descriptors / Indicators  Burning    Pain Type  Acute pain    Pain Onset  1 to 4 weeks ago  Pain Frequency  Intermittent    Aggravating Factors   dependent position    Pain Relieving Factors  elevation    Effect of Pain on Daily Activities  Pt reports it is not effecting her daily activities becuase she does not do much.         [facility] PT Assessment - 06/23/19 0001      Assessment   Medical Diagnosis  Metastatic breast cancer     Referring Provider (PT)  Sandi Mealy PA    Hand Dominance  Right    Next MD Visit  06/28/2019      Precautions   Precautions  Other (comment)   metastatic breast cancer     Balance Screen   Has the patient fallen in the past 6 months  No    Has the patient had a decrease in activity level because of a fear of falling?   Yes    Is the patient reluctant to leave their home because of a fear of falling?   No      Home Social worker  Private residence    Living Arrangements  Spouse/significant  other;Children;Other relatives    Type of [place] Access  Level entry    [place]  Two level    Additional Comments  full size basement pt can avoid stairs      Prior Function   Level of Independence  Independent    Vocation  Retired;On disability      Cognition   Overall Cognitive Status  Within Functional Limits for tasks assessed      ROM / Strength   AROM / PROM / Strength  AROM      AROM   AROM Assessment Site  Shoulder    Right/Left Shoulder  Right;Left    Right Shoulder Flexion  166 Degrees    Right Shoulder ABduction  158 Degrees    Right Shoulder Internal Rotation  78 Degrees    Right Shoulder External Rotation  74 Degrees    Left Shoulder Flexion  122 Degrees    Left Shoulder ABduction  110 Degrees    Left Shoulder Internal Rotation  64 Degrees    Left Shoulder External Rotation  88 Degrees        LYMPHEDEMA/ONCOLOGY QUESTIONNAIRE - 06/23/19 1638      Type   Cancer Type  Metastatic breast cancer       Surgeries   Mastectomy Date  --   10 years ago    Sentinel Lymph Node Biopsy Date  --   lympynode removal    Number Lymph Nodes Removed  1      Treatment   Active Chemotherapy Treatment  Yes    Past Chemotherapy Treatment  Yes    Active Radiation Treatment  No    Past Radiation Treatment  Yes    Body Site  L chest wa, cervical spine and R hip    Current Hormone Treatment  No    Past Hormone Therapy  No      What other symptoms do you have   Are you Having Heaviness or Tightness  Yes    Are you having Pain  Yes    Are you having pitting edema  Yes    Body Site  antebrachium posterior    Is it Hard or Difficult finding clothes that fit  No    Do you have infections  No    Is there  Decreased scar mobility  No      Lymphedema Stage   Stage  STAGE 1 SPONTANEOUSLY REVERSIBLE      Lymphedema Assessments   Lymphedema Assessments  Upper extremities      Right Upper Extremity Lymphedema   15 cm Proximal to Olecranon Process  19 cm    10  cm Proximal to Olecranon Process  18.4 cm    Olecranon Process  20.2 cm    15 cm Proximal to Ulnar Styloid Process  17.9 cm    10 cm Proximal to Ulnar Styloid Process  15.3 cm    Just Proximal to Ulnar Styloid Process  12.8 cm    Across Hand at [employer]  15.4 cm    At [place] of 2nd Digit  5 cm      Left Upper Extremity Lymphedema   15 cm Proximal to Olecranon Process  21.5 cm    10 cm Proximal to Olecranon Process  21.4 cm    Olecranon Process  24.5 cm    15 cm Proximal to Ulnar Styloid Process  22 cm    10 cm Proximal to Ulnar Styloid Process  18.4 cm    Just Proximal to Ulnar Styloid Process  15.4 cm    Across Hand at [employer]  18 cm    At [place] of 2nd Digit  5.5 cm             Outpatient Re[facility]  Lymphedema Life Impact Scale Total Score  32.35 %      Objective measurements completed on examination: See above findings.              PT Education - 06/23/19 1651    Education Details  Pt was educated on the anatomy and physiology of the lymphatic system. She was educated on complete decongestive therapy including skin care, exercise, compression and manual lymph drainage. She was provided wtih hand outs to look at when she is at home with her spouse due to she would like to have her spouse learn if possible to perform at home secondary to multiple doctors appointments, fatigue and nausea related to treatments.    Person(s) Educated  Patient    Methods  Explanation;Handout    Comprehension  Verbalized understanding       PT Short Term Goals - 06/23/19 1705      PT SHORT TERM GOAL #1   Title  Pt and spouse will be independent with self MLD and compressoin wrapping within 2 weeks for autonomy of care.    Baseline  Currently pt and spouse do not know how to perform MLD and wrapping    Time  2    Period  Weeks    Status  New    Target Date  07/14/19        PT Long Term Goals - 06/23/19  1705      PT LONG TERM GOAL #1   Title  Pt will have 1.5 cm or greater reduction in circumferential measurements of the LUE in the brachium and proximal antebrachium to demonstrate decreased fluid.    Baseline  see measurements.    Time  4    Period  Weeks    Status  New    Target Date  07/28/19      PT LONG TERM GOAL #2   Title  Pt will have appropriate garment to wear on a daily basis for edema management.  Baseline  pt currently does not have a compression garment.    Time  4    Period  Weeks    Status  New    Target Date  07/28/19      PT LONG TERM GOAL #3   Title  Pt will demonstrate 140 degrees L shoulder flexion and 130 degrees L shoulder abduction to demonstrate improved functional ROM    Baseline  L shoulder flexion 122, L shoulder abduction 110    Time  4    Period  Weeks    Status  New    Target Date  07/28/19             Plan - 06/23/19 1656    Clinical Impression Statement  Pt presents to physical therapy with increased circumferential measurements of the LUE compared to the RUE and decreased L shoulder ROM compared to the R. She had an IV for fluids through her port almost 2 weeks ago and 2 days later noticed increased edema in her LUE which is the side she had a Bil masectomy and 1 lymphnode removal over 10 years ago. Since that time she has been diagnosized with metastatic breast cancer with areas in her L cervical spine, chest wall and R hip that she has recieved radiation. Pt spouse and daughters family lives with her and she helps care for her grand kids. She gets very fatigued due to continueing chemotherapy treatment and would like to see if her spouse can learn to treat her at home. Pt will benefit from skilled physical therapy services in order to address the above limitations to decrease risk for immobility and infection.    Personal Factors and Comorbidities  Comorbidity 3+    Comorbidities  Hx masectomy bil,  HX radiation and current chemotherapy     Stability/Clinical Decision Making  Evolving/Moderate complexity    Clinical Decision Making  Moderate    Rehab Potential  Good    PT Frequency  3x / week   or as needed if spouse learns how to perform CDT   PT Duration  4 weeks    PT Treatment/Interventions  Therapeutic activities;Therapeutic exercise;Neuromuscular re-education;Manual techniques    PT Next Visit Plan  Teach MLD and self wrapping. Pt will bring spouse she was provided with instruction at her evaluation.    Recommended Other Services  compression sleeve       Patient will benefit from skilled therapeutic intervention in order to improve the following deficits and impairments:  Pain, Increased edema, Decreased range of motion  Visit Diagnosis: Malignant neoplasm of lower-outer quadrant of left breast of female, estrogen receptor positive (HCC)  Postmastectomy lymphedema  Stiffness of left shoulder, not elsewhere classified  Pain in left arm     Problem List Patient Active Problem List   Diagnosis Date Noted  . Malignant neoplasm of lower-outer quadrant of left breast of female, estrogen receptor positive ([place]) 10/21/2018  . Goals of care, counseling/discussion 10/21/2018  . Bone metastasis ([place]) 12/22/2017  . Primary osteoarthritis of left hip 05/26/2017  . Osteoarthritis of left hip 05/23/2017  . Breast cancer of lower-outer quadrant of left female breast ([place]) 08/06/2012    Ander Purpura, PT 06/23/2019, 5:08 PM  [place], [place], [address] Phone: [phone]   Fax:  [phone]  Name: Elizabeth [name] MRN: [id number] Date of Birth: 13-Sep-1955

## 2019-06-23 NOTE — Patient Instructions (Signed)

## 2019-06-25 ENCOUNTER — Other Ambulatory Visit: Payer: Self-pay

## 2019-06-25 ENCOUNTER — Ambulatory Visit: Payer: Medicare Other | Admitting: Rehabilitation

## 2019-06-25 ENCOUNTER — Encounter: Payer: Self-pay | Admitting: Rehabilitation

## 2019-06-25 DIAGNOSIS — Z170 Estrogen receptor positive status [ER+]: Secondary | ICD-10-CM

## 2019-06-25 DIAGNOSIS — C50512 Malignant neoplasm of lower-outer quadrant of left female breast: Secondary | ICD-10-CM

## 2019-06-25 DIAGNOSIS — I972 Postmastectomy lymphedema syndrome: Secondary | ICD-10-CM

## 2019-06-25 DIAGNOSIS — M79602 Pain in left arm: Secondary | ICD-10-CM

## 2019-06-25 DIAGNOSIS — M25612 Stiffness of left shoulder, not elsewhere classified: Secondary | ICD-10-CM

## 2019-06-25 NOTE — Patient Instructions (Addendum)
PLEASE KEEP YOUR BANDAGES ON AS LONG AS POSSIBLE TO GET THE BEST SWELLING REDUCTION. Should your bandages become uncomfortable or feel too tight, follow these steps: 1. Elevate your extremity higher than your heart.  2. Try to move your arm or leg joints against the firmness of the bandage to help with moving the fluid and allow the bandages to loosen a bit.  3. If the bandaging is still is too tight, it is ok to carefully remove the top layer.  There will still be more layers under it that can provide compression to your extremity. 4. Finally, if you STILL have significant pain after trying these steps, it is ok to take the bandage off.  Check your skin carefully for any signs of irritation  5. PLEASE bring ALL bandage materials back to your next appointment as we will reuse what we can TAKE CARE OF YOUR BANDAGES SO THEY WILL LAST LONGER AND STAY IN BETTER CONDITION Washing bandages:  Wash periodically using a mild detergent in warm water.  Do not use fabric softener or bleach.  Place bandages in a mesh lingerie bag or in a tied off pillow case and use the gentle cycle of the washing machine or hand wash. If you hand wash, you may want to put them in the spin cycle of your washer to get the extra water out, but make sure you put them in a mesh bag first. Do not wring or stretch them while they are wet.  Drying bandages: Lay the bandages out smoothly on a towel away from direct sunlight or heating sources that can damage the fabric. Rolling bandages in a towel and gently squeezing the towel to remove excess water before laying them out can speed up the process.  If you use a drying rack, place a towel on top of the rack to lay the bandages on.  If they hang down to dry, they fabric could be stretched out and the bandage will lose its compression.   Or, keep bandages in the mesh bag and dry them in the dryer on the low or no heat cycle. Rolling bandages: Please roll your bandages after drying them so they  are ready for your next treatment. If they are rolled too loose, they will be difficult to apply.  If rolled too tight, they can get stretched out.   TAKE CARE OF YOUR SKIN 1. Apply a low pH moisturizing lotion to your skin daily 2. Avoid scratching your skin 3. Treat skin irritations quickly  4. Know the 5 warning signs of infection: redness, pain, warmth to touch, fever and increased swelling.  Call your physician immediately if you notice any of these signs of a possible infection.    Self bandaging the arm:      This video is for caregiver assistance:  ReportConsumer.com  -OR-  Enter " Lymphedema Management: Caregiver Bandaging you arm" into your search browser and your video should appear in the results

## 2019-06-25 NOTE — Therapy (Signed)
[place], [place], [address] Phone: [phone]   Fax:  [phone]  Physical Therapy Treatment  Patient Details  Name: Elizabeth [name] MRN: [id number] Date of Birth: [birth year] Referring Provider (PT): Sandi Mealy PA   Encounter Date: 06/25/2019  PT End of Session - 06/25/19 1200    Visit Number  2    Number of Visits  13    Date for PT Re-Evaluation  07/28/19    PT Start Time  1101    PT Stop Time  1156    PT Time Calculation (min)  55 min    Activity Tolerance  Patient tolerated treatment well    Behavior During Therapy  [facility] for tasks assessed/performed       Past Medical History:  Diagnosis Date  . Arthritis   . Breast cancer ([place]) 2010   Left  . Chronic sinusitis   . Family history of adverse reaction to anesthesia    sister-nausea/vomiting  . History of chemotherapy 01/2009-03/2009  . Hypercholesteremia   . Hypothyroid    "inactive thyroid"  . Metastasis ([place]) 11/2017   LN  . Metastatic cancer to bone ([place]) 11/2017  . Migraines   . RSD (reflex sympathetic dystrophy)     Past Surgical History:  Procedure Laterality Date  . BREAST RECONSTRUCTION    . EYE SURGERY Bilateral 7/18, 10/18  . IR IMAGING GUIDED PORT INSERTION  10/28/2018  . IR INSTILL VIA CHEST TUBE AGENT FOR FIBRINOLYSIS INI DAY  10/09/2018  . IR PATIENT EVAL TECH 0-60 MINS  09/08/2018  . IR PERC PLEURAL DRAIN W/INDWELL CATH W/IMG GUIDE  08/05/2018  . IR RADIOLOGIST EVAL & MGMT  10/12/2018  . IR REMOVAL OF PLURAL CATH W/CUFF  10/26/2018  . KNEE ARTHROSCOPY Bilateral   . KNEE SURGERY Right    Two surgeries  . MASTECTOMY Bilateral 05/22/2009  . MASTECTOMY Bilateral 2011   restrictions on left arm-no labs/bloodpressure  . NASAL SINUS SURGERY    . PORTA CATH INSERTION  12/2008  . PORTA CATH REMOVAL  05/2009  . ROOT CANAL    . SHOULDER SURGERY Right   . TOTAL HIP ARTHROPLASTY Left 05/26/2017   Procedure: TOTAL HIP  ARTHROPLASTY ANTERIOR APPROACH;  Surgeon: Frederik Pear, MD;  Location: [facility];  Service: Orthopedics;  Laterality: Left;    There were no vitals filed for this visit.  Subjective Assessment - 06/25/19 1102    Subjective  I think it it already better today;    Pertinent History  Metastatic breast cancer with 1 lymph node removed 10 years ago. Pt had radiation in 01/2019 on her L upper quadrant, R hip and in her L cervical spine. Bilateral mastectomy stage IIB T2 N0 M0 IDC left breast grade 1, 2.5 cm, ER 99%, PR 41%, Ki-67 11%, HER-2 negative    Currently in Pain?  No/denies                  [facility]patient Re[facility]  Lymphedema Life Impact Scale Total Score  32.35 %           [facility] Adult PT Treatment/Exercise - 06/25/19 0001      Manual Therapy   Manual Therapy  Compression Bandaging    Compression Bandaging  pt and husband were educated on bandaging for the Rt UE.  PT provided instruction on garment care and use and application of all components to include: lotion, thin stockinette, fingers 1-5 with  3" mollelast doubled over, 1 6cm hand and forearm bandage and 1 8 cm from wrist to axilla.  Due to pt small arm size and reports of being unsure how her RSD will tolerate the bandages we left it at 2 bandages for today but pt was given a 10cm to try and add later.  PT applied bandage and then husband applied with cueing and changes as needed.  Husband able to apply a very good bandage and PT did not have to rewrap.  INformation on when to remove and what to do with pain or when bandages are too tight.               PT Education - 06/25/19 1200    Education Details  UE compression bandaging by husband    Person(s) Educated  Patient;Spouse    Methods  Explanation;Demonstration;Tactile cues;Verbal cues;Handout    Comprehension  Verbalized understanding;Returned demonstration;Verbal cues required;Tactile cues required        PT Short Term Goals - 06/25/19 1205      PT SHORT TERM GOAL #1   Title  Pt and spouse will be independent with self MLD and compressoin wrapping within 2 weeks for autonomy of care.    Baseline  ind with compression wrapping    Status  Partially Met        PT Long Term Goals - 06/23/19 1705      PT LONG TERM GOAL #1   Title  Pt will have 1.5 cm or greater reduction in circumferential measurements of the LUE in the brachium and proximal antebrachium to demonstrate decreased fluid.    Baseline  see measurements.    Time  4    Period  Weeks    Status  New    Target Date  07/28/19      PT LONG TERM GOAL #2   Title  Pt will have appropriate garment to wear on a daily basis for edema management.    Baseline  pt currently does not have a compression garment.    Time  4    Period  Weeks    Status  New    Target Date  07/28/19      PT LONG TERM GOAL #3   Title  Pt will demonstrate 140 degrees L shoulder flexion and 130 degrees L shoulder abduction to demonstrate improved functional ROM    Baseline  L shoulder flexion 122, L shoulder abduction 110    Time  4    Period  Weeks    Status  New    Target Date  07/28/19            Plan - 06/25/19 1201    Clinical Impression Statement  Pt reports the arm feels better already since evaluation visit.  Still with visible soft edema in the hand and fingers and mildly into the forearm.  Pt and husband were educated on compression wrapping for the UE with husband demonstrating correct bandaging upon leaving.  Pt and husband are knowledgeable about safety and bandage use and when to remove as well as bandage care.  Pt may attempt to apply 10cm bandage to the UE at home if tolerated 2 bandages well.  Will return for MLD education and to remeasure UE to see if bandaging if effective and when garments could be ordered.    PT Frequency  3x / week    PT Duration  4 weeks    PT Treatment/Interventions  Therapeutic activities;Therapeutic  exercise;Neuromuscular re-education;Manual  techniques;Compression bandaging;Manual lymph drainage    PT Next Visit Plan  how was bandaging, recheck measurements, teach spouse MLD, any more appts?    Consulted and Agree with Plan of Care  Patient;Family member/caregiver       Patient will benefit from skilled therapeutic intervention in order to improve the following deficits and impairments:     Visit Diagnosis: Malignant neoplasm of lower-outer quadrant of left breast of female, estrogen receptor positive ([place])  Postmastectomy lymphedema  Stiffness of left shoulder, not elsewhere classified  Pain in left arm     Problem List Patient Active Problem List   Diagnosis Date Noted  . Malignant neoplasm of lower-outer quadrant of left breast of female, estrogen receptor positive ([place]) 10/21/2018  . Goals of care, counseling/discussion 10/21/2018  . Bone metastasis ([place]) 12/22/2017  . Primary osteoarthritis of left hip 05/26/2017  . Osteoarthritis of left hip 05/23/2017  . Breast cancer of lower-outer quadrant of left female breast ([place]) 08/06/2012    Stark Bray 06/25/2019, 12:07 PM  [place], [place], [address] Phone: [phone]   Fax:  [phone]  Name: Elizabeth [name] MRN: [id number] Date of Birth: 1956/03/05

## 2019-06-27 NOTE — Progress Notes (Signed)
Patient Care Team: [name], [place] as PCP - General (Nurse Practitioner)  DIAGNOSIS:    ICD-10-CM   1. Bone metastasis ([place])  C79.51   2. Malignant neoplasm of lower-outer quadrant of left breast of female, estrogen receptor positive ([place])  C50.512    Z17.0     SUMMARY OF ONCOLOGIC HISTORY: Oncology History  Breast cancer of lower-outer quadrant of left female breast ([place])  12/13/2008 Initial Diagnosis   Left breast biopsy: Invasive ductal carcinoma ER 90% percent, PR 41%, Ki-67 11%, HER-2 negative ratio 1, Oncotype DX score 23, ROR15%   01/27/2009 - 03/10/2009 Neo-Adjuvant Chemotherapy   Neoadjuvant FEC 4; participant in the "bed sheets" study.   05/12/2009 -  Anti-estrogen oral therapy   Femara 2.5 mg daily   05/22/2009 Surgery   Bilateral mastectomy stage IIB T2 N0 M0 IDC left breast grade 1, 2.5 cm, ER 99%, PR 41%, Ki-67 11%, HER-2 negative   12/10/2009 Surgery   Breast reconstruction surgery   11/14/2017 Relapse/Recurrence   Mid back pain: MRI revealed T11 severe pathologic fracture, abnormal signal T10, T11 and T12, moderate canal stenosis at T11   12/03/2017 PET scan   Hypermetabolic metastatic breast cancer involving thoracic and upper abdominal lymph nodes, spine and ribs. There may be hypermetabolic adenopathy in the left neck; Hypermetabolic lymph node versus intramuscular metastasis involving the medial left pectoralis musculature Pathologic T11 fracture     12/11/2017 Procedure   Biopsy of T11 vertebral body lytic lesion: Metastatic breast adenocarcinoma ER positive, PR negative   12/19/2017 - 12/26/2017 Radiation Therapy   Palliative XRT to T 11   01/12/2018 - 10/26/2018 Anti-estrogen oral therapy   Patient could not tolerate Faslodex because of severe hypotension and severe pain in the legs after injections.  Ibrance with letrozole starting 01/12/2018   10/27/2018 -  Chemotherapy   Palliative chemotherapy with Halaven   05/03/2019 -  Anti-estrogen oral therapy     Alpelisib with Faslodex   Malignant neoplasm of lower-outer quadrant of left breast of female, estrogen receptor positive ([place])  10/21/2018 Initial Diagnosis   Malignant neoplasm of lower-outer quadrant of left breast of female, estrogen receptor positive ([place])   10/27/2018 - 05/02/2019 Chemotherapy   The patient had eriBULin mesylate (HALAVEN) 1.65 mg in sodium chloride 0.9 % 100 mL chemo infusion, 1.1 mg/m2 = 2.1 mg, Intravenous,  Once, 9 of 10 cycles Dose modification: 1.1 mg/m2 (original dose 1.4 mg/m2, Cycle 1, Reason: Other (see comments), Comment: CrCL < 50) Administration: 1.65 mg (10/27/2018), 1.65 mg (11/03/2018), 1.65 mg (11/17/2018), 1.65 mg (11/24/2018), 1.65 mg (12/08/2018), 1.65 mg (12/15/2018), 1.65 mg (12/29/2018), 1.65 mg (01/18/2019), 1.65 mg (02/08/2019), 1.65 mg (03/01/2019), 1.65 mg (03/22/2019), 1.65 mg (04/12/2019)  for chemotherapy treatment.    11/17/2018 - 12/03/2018 Radiation Therapy   Palliative radiation to the T5 location of the spine and right femur, 35 Gy in 14 fractions   04/05/2019 Miscellaneous   Guardant 360:PIK3CA mutation (alpelisib), NF1 S2 467 (everolimus) ARID1A S1791 (response to neratinib, olaparib, rucaparib, talazoparib), T p53 and SMAD4 Q534, TMB 35.41 mutations /MB, no evidence of MSI high     CHIEF COMPLIANT: Follow-up of metastatic breast cancer  INTERVAL HISTORY: Elizabeth [name] is a 64 y.o. with above-mentioned history of metastatic breast cancerwhohas had recurrent pleural effusions.Sheis currently on treatment with AlpelisibandFaslodex. She presents to the clinic todayfor a toxicity check. Patient tells me that she has had developed a profound lymphedema the left arm.  Ultrasound of the left arm was negative for blood  clots.  She is seeing physical therapy who have wrapped her hand and she tells me that the lymphedema is markedly improved.  Since we started dexamethasone she is been feeling a lot better.  Appetite is improved and she is eating  better.  ALLERGIES:  is allergic to shellfish allergy and sulfa antibiotics.  MEDICATIONS:  Current Outpatient Medications  Medication Sig Dispense Refill  . alpelisib (PIQRAY '300MG'$  DAILY DOSE) 2 x 150 MG Therapy Pack Take two '150mg'$  tablets with food at the same time daily. Swallow whole, do not crush, chew, or split. 60 each 3  . Biotin 10000 MCG TABS Take 10,000 mcg by mouth daily.     . Cholecalciferol (VITAMIN D PO) Take 5,000 Units by mouth daily.     Marland Kitchen dexamethasone (DECADRON) 1 MG tablet Take 1 tablet (1 mg total) by mouth daily. 30 tablet 3  . DULoxetine (CYMBALTA) 30 MG capsule TAKE 1 CAPSULE (30 MG TOTAL) BY MOUTH 2 (TWO) TIMES DAILY. (Patient not taking: Reported on 06/23/2019) 180 capsule 2  . fentaNYL (DURAGESIC) 25 MCG/HR Place 1 patch onto the skin every 3 (three) days. 10 patch 0  . fluconazole (DIFLUCAN) 100 MG tablet Take 1 tablet (100 mg total) by mouth daily. 3 tablet 0  . gabapentin (NEURONTIN) 300 MG capsule Take 1 capsule (300 mg total) by mouth 2 (two) times daily. 180 capsule 2  . KLOR-CON M20 20 MEQ tablet TAKE 1 TABLET BY MOUTH EVERY DAY (Patient not taking: Reported on 06/23/2019) 90 tablet 0  . letrozole (FEMARA) 2.5 MG tablet Take 1 tablet (2.5 mg total) by mouth daily. 90 tablet 3  . levothyroxine (SYNTHROID, LEVOTHROID) 25 MCG tablet Take 25 mcg by mouth daily before breakfast.     . loperamide (IMODIUM) 2 MG capsule 1 to 2 PO QID prn diarrhea 30 capsule 22  . LORazepam (ATIVAN) 1 MG tablet SMARTSIG:1 Tablet(s) By Mouth As Needed    . magic mouthwash w/lidocaine SOLN Take 5 mLs by mouth 3 (three) times daily as needed for mouth pain. 100 mL 0  . Multiple Vitamin (MULTIVITAMIN) tablet Take 1 tablet by mouth daily.    Marland Kitchen omeprazole (PRILOSEC) 20 MG capsule Take 1 capsule (20 mg total) by mouth daily. 90 capsule 0  . ondansetron (ZOFRAN) 8 MG tablet Take 1 tablet (8 mg total) by mouth every 8 (eight) hours as needed for nausea. 30 tablet 3  . oxyCODONE-acetaminophen  (PERCOCET) 10-325 MG tablet Take 1 tablet by mouth every 8 (eight) hours as needed for pain. 90 tablet 0  . prednisoLONE acetate (PRED FORTE) 1 % ophthalmic suspension Place 1 drop into both eyes daily.    . prochlorperazine (COMPAZINE) 5 MG tablet Take 1 tablet (5 mg total) by mouth every 6 (six) hours as needed for nausea or vomiting. (Patient not taking: Reported on 06/23/2019) 45 tablet 2  . simethicone (GAS-X) 80 MG chewable tablet Chew 1 tablet (80 mg total) by mouth every 6 (six) hours as needed for flatulence. 120 tablet 2  . traMADol (ULTRAM) 50 MG tablet Take 1 tablet (50 mg total) by mouth every 6 (six) hours as needed. 120 tablet 0   No current facility-administered medications for this visit.    PHYSICAL EXAMINATION: ECOG PERFORMANCE STATUS: 1 - Symptomatic but completely ambulatory  Vitals:   06/28/19 0931  BP: (!) 118/93  Pulse: 100  Resp: 17  Temp: 98.7 F (37.1 C)  SpO2: 98%   Filed Weights   06/28/19 0931  Weight: 96  lb 4.8 oz (43.7 kg)    LABORATORY DATA:  I have reviewed the data as listed CMP Latest Ref Rng & Units 06/28/2019 06/14/2019 05/31/2019  Glucose 70 - 99 mg/dL 114(H) 117(H) 155(H)  BUN 8 - 23 mg/dL '13 14 14  '$ Creatinine 0.44 - 1.00 mg/dL 0.99 1.04(H) 1.14(H)  Sodium 135 - 145 mmol/L 144 140 139  Potassium 3.5 - 5.1 mmol/L 3.6 3.4(L) 4.0  Chloride 98 - 111 mmol/L 103 102 100  CO2 22 - 32 mmol/L '30 28 30  '$ Calcium 8.9 - 10.3 mg/dL 8.9 8.8(L) 8.9  Total Protein 6.5 - 8.1 g/dL 6.5 6.8 7.2  Total Bilirubin 0.3 - 1.2 mg/dL 0.4 0.7 0.3  Alkaline Phos 38 - 126 U/L 110 93 104  AST 15 - 41 U/L '26 29 26  '$ ALT 0 - 44 U/L '20 18 17    '$ Lab Results  Component Value Date   WBC 5.3 06/28/2019   HGB 12.5 06/28/2019   HCT 38.2 06/28/2019   MCV 93.9 06/28/2019   PLT 540 (H) 06/28/2019   NEUTROABS 3.2 06/28/2019    ASSESSMENT & PLAN:  Malignant neoplasm of lower-outer quadrant of left breast of female, estrogen receptor positive (HCC) Left breast cancer  invasive ductal carcinoma 2.5 cm in size grade 1, ER 99%, PR 41%, Ki-67 11%, HER-2 negative T2 N0 M0 stage II a status post neoadjuvant chemotherapy followed by surgery February 2011and wason antiestrogen therapy with Femara from January 2011-July 2017  Treatment : 1. Palliative radiation to T11 vertebral body and the pectoralis muscle: 12/20/17-12/26/17 2. Ibrance with letrozole along with Delton See or Zometadiscontinued July 2020 due to progression Patient could not tolerate Faslodex because of intense bone pain as well as hypotension. 3. Halaven started 10/27/2018,completed 9 cycles as of 04/12/2019 discontinued for progression of disease.  04/05/2019:Guardant 360:PIK3CA mutation (alpelisib), NF1 S2 467 (everolimus) ARID1A S1791 (response to neratinib, olaparib, rucaparib, talazoparib), T p53 and SMAD4 Q534, TMB 35.41 Based on these results, it appears that she could respond to alpelisib as well as everolimus and olaparib as well as possibly immunotherapy.  CT CAP 04/30/2019: There is increased now nearly confluent left-sided pleural nodularity, increase in size of pericardial nodule 1.4 cm, enlargement of left axillary lymph nodes 1.4 cm, enlargement of left retroperitoneal lymph node 1.5 cm, no significant change in the sclerotic bone metastases. Radiation fibrosis in the lungs, loculated left pleural effusion with pleural thickening and nodularity. ---------------------------------------------------------------------------------------------------------------------- Current treatment: Alpelisib with Faslodex (250 mg because of prior intolerance) started 05/03/19 Alpelisib toxicities: 1.  Severe diarrhea: Currently on Imodium and probiotics.  When she takes 2 Imodium's every morning the diarrhea appears to be better 2.  Nausea: Instructed her to take ondansetron. 3.  Decreased appetite: On dexamethasone.  Appetite has improved energy levels are still low  Fasting blood sugar today is  113.  Bone pain due to metastatic breast cancer: Continue with fentanyl and Percocets.  I renewed her prescription for Percocet.  Return to clinic in 4 weeks for follow-up with injections and Zometa    No orders of the defined types were placed in this encounter.  The patient has a good understanding of the overall plan. she agrees with it. she will call with any problems that may develop before the next visit here.  Total time spent: 30 mins including face to face time and time spent for planning, charting and coordination of care  Nicholas Lose, MD 06/28/2019  I, Cloyde Reams Dorshimer, am acting as scribe for Dr. Nicholas Lose.  I have reviewed the above documentation for accuracy and completeness, and I agree with the above.

## 2019-06-28 ENCOUNTER — Inpatient Hospital Stay (HOSPITAL_BASED_OUTPATIENT_CLINIC_OR_DEPARTMENT_OTHER): Payer: Medicare Other | Admitting: Hematology and Oncology

## 2019-06-28 ENCOUNTER — Other Ambulatory Visit: Payer: Self-pay

## 2019-06-28 ENCOUNTER — Encounter: Payer: Self-pay | Admitting: Nutrition

## 2019-06-28 ENCOUNTER — Inpatient Hospital Stay: Payer: Medicare Other

## 2019-06-28 ENCOUNTER — Inpatient Hospital Stay: Payer: Medicare Other | Admitting: Nutrition

## 2019-06-28 ENCOUNTER — Ambulatory Visit: Payer: Medicare Other

## 2019-06-28 VITALS — BP 118/93 | HR 100 | Temp 98.7°F | Resp 17 | Ht 63.0 in | Wt 96.3 lb

## 2019-06-28 DIAGNOSIS — C7951 Secondary malignant neoplasm of bone: Secondary | ICD-10-CM

## 2019-06-28 DIAGNOSIS — M25612 Stiffness of left shoulder, not elsewhere classified: Secondary | ICD-10-CM

## 2019-06-28 DIAGNOSIS — C50512 Malignant neoplasm of lower-outer quadrant of left female breast: Secondary | ICD-10-CM

## 2019-06-28 DIAGNOSIS — Z170 Estrogen receptor positive status [ER+]: Secondary | ICD-10-CM

## 2019-06-28 DIAGNOSIS — I972 Postmastectomy lymphedema syndrome: Secondary | ICD-10-CM

## 2019-06-28 DIAGNOSIS — M79602 Pain in left arm: Secondary | ICD-10-CM

## 2019-06-28 LAB — CMP (CANCER CENTER ONLY)
ALT: 20 U/L (ref 0–44)
AST: 26 U/L (ref 15–41)
Albumin: 3.3 g/dL — ABNORMAL LOW (ref 3.5–5.0)
Alkaline Phosphatase: 110 U/L (ref 38–126)
Anion gap: 11 (ref 5–15)
BUN: 13 mg/dL (ref 8–23)
CO2: 30 mmol/L (ref 22–32)
Calcium: 8.9 mg/dL (ref 8.9–10.3)
Chloride: 103 mmol/L (ref 98–111)
Creatinine: 0.99 mg/dL (ref 0.44–1.00)
GFR, Est AFR Am: >60 mL/min (ref >60)
GFR, Estimated: 60 mL/min — ABNORMAL LOW (ref >60)
Glucose, Bld: 114 mg/dL — ABNORMAL HIGH (ref 70–99)
Potassium: 3.6 mmol/L (ref 3.5–5.1)
Sodium: 144 mmol/L (ref 135–145)
Total Bilirubin: 0.4 mg/dL (ref 0.3–1.2)
Total Protein: 6.5 g/dL (ref 6.5–8.1)

## 2019-06-28 LAB — CBC WITH DIFFERENTIAL (CANCER CENTER ONLY)
Abs Immature Granulocytes: 0.01 10*3/uL (ref 0.00–0.07)
Basophils Absolute: 0.1 10*3/uL (ref 0.0–0.1)
Basophils Relative: 1 %
Eosinophils Absolute: 0.4 10*3/uL (ref 0.0–0.5)
Eosinophils Relative: 8 %
HCT: 38.2 % (ref 36.0–46.0)
Hemoglobin: 12.5 g/dL (ref 12.0–15.0)
Immature Granulocytes: 0 %
Lymphocytes Relative: 19 %
Lymphs Abs: 1 10*3/uL (ref 0.7–4.0)
MCH: 30.7 pg (ref 26.0–34.0)
MCHC: 32.7 g/dL (ref 30.0–36.0)
MCV: 93.9 fL (ref 80.0–100.0)
Monocytes Absolute: 0.6 10*3/uL (ref 0.1–1.0)
Monocytes Relative: 11 %
Neutro Abs: 3.2 10*3/uL (ref 1.7–7.7)
Neutrophils Relative %: 61 %
Platelet Count: 540 10*3/uL — ABNORMAL HIGH (ref 150–400)
RBC: 4.07 MIL/uL (ref 3.87–5.11)
RDW: 16 % — ABNORMAL HIGH (ref 11.5–15.5)
WBC Count: 5.3 10*3/uL (ref 4.0–10.5)
nRBC: 0 % (ref 0.0–0.2)

## 2019-06-28 MED ORDER — FULVESTRANT 250 MG/5ML IM SOLN
INTRAMUSCULAR | Status: AC
  Filled 2019-06-28: qty 5

## 2019-06-28 MED ORDER — OXYCODONE-ACETAMINOPHEN 10-325 MG PO TABS: 1.0000 | ORAL_TABLET | Freq: Three times a day (TID) | ORAL | 0 refills | Status: DC | PRN

## 2019-06-28 MED ORDER — SODIUM CHLORIDE 0.9% FLUSH
3.0000 mL | Freq: Once | INTRAVENOUS | Status: AC | PRN
  Administered 2019-06-28: 3 mL
  Filled 2019-06-28: qty 10

## 2019-06-28 MED ORDER — HEPARIN SOD (PORK) LOCK FLUSH 100 UNIT/ML IV SOLN
500.0000 [IU] | Freq: Once | INTRAVENOUS | Status: AC | PRN
  Administered 2019-06-28: 500 [IU]
  Filled 2019-06-28: qty 5

## 2019-06-28 MED ORDER — FULVESTRANT 250 MG/5ML IM SOLN
250.0000 mg | Freq: Once | INTRAMUSCULAR | Status: AC
  Administered 2019-06-28: 250 mg via INTRAMUSCULAR

## 2019-06-28 NOTE — Therapy (Signed)
[place], [place], [address] Phone: [phone]   Fax:  [phone]  Physical Therapy Treatment  Patient Details  Name: Elizabeth [name] MRN: [id number] Date of Birth: May 30, 1955 Referring Provider (PT): Sandi Mealy PA   Encounter Date: 06/28/2019  PT End of Session - 06/28/19 1603    Visit Number  3    Number of Visits  13    Date for PT Re-Evaluation  07/28/19    PT Start Time  1600    PT Stop Time  1635    PT Time Calculation (min)  35 min    Activity Tolerance  Patient tolerated treatment well    Behavior During Therapy  [facility] for tasks assessed/performed       Past Medical History:  Diagnosis Date  . Arthritis   . Breast cancer ([place]) 2010   Left  . Chronic sinusitis   . Family history of adverse reaction to anesthesia    sister-nausea/vomiting  . History of chemotherapy 01/2009-03/2009  . Hypercholesteremia   . Hypothyroid    "inactive thyroid"  . Metastasis ([place]) 11/2017   LN  . Metastatic cancer to bone ([place]) 11/2017  . Migraines   . RSD (reflex sympathetic dystrophy)     Past Surgical History:  Procedure Laterality Date  . BREAST RECONSTRUCTION    . EYE SURGERY Bilateral 7/18, 10/18  . IR IMAGING GUIDED PORT INSERTION  10/28/2018  . IR INSTILL VIA CHEST TUBE AGENT FOR FIBRINOLYSIS INI DAY  10/09/2018  . IR PATIENT EVAL TECH 0-60 MINS  09/08/2018  . IR PERC PLEURAL DRAIN W/INDWELL CATH W/IMG GUIDE  08/05/2018  . IR RADIOLOGIST EVAL & MGMT  10/12/2018  . IR REMOVAL OF PLURAL CATH W/CUFF  10/26/2018  . KNEE ARTHROSCOPY Bilateral   . KNEE SURGERY Right    Two surgeries  . MASTECTOMY Bilateral 05/22/2009  . MASTECTOMY Bilateral 2011   restrictions on left arm-no labs/bloodpressure  . NASAL SINUS SURGERY    . PORTA CATH INSERTION  12/2008  . PORTA CATH REMOVAL  05/2009  . ROOT CANAL    . SHOULDER SURGERY Right   . TOTAL HIP ARTHROPLASTY Left 05/26/2017   Procedure: TOTAL HIP  ARTHROPLASTY ANTERIOR APPROACH;  Surgeon: Frederik Pear, MD;  Location: [facility];  Service: Orthopedics;  Laterality: Left;    There were no vitals filed for this visit.  Subjective Assessment - 06/28/19 1604    Subjective  Pt and spouse report that they have been wrapping her arm consistently. They state the only thing they had one issue with was the fingers too tight last night but then they re-wrapped and it felt great.    Pertinent History  Metastatic breast cancer with 1 lymph node removed 10 years ago. Pt had radiation in 01/2019 on her L upper quadrant, R hip and in her L cervical spine. Bilateral mastectomy stage IIB T2 N0 M0 IDC left breast grade 1, 2.5 cm, ER 99%, PR 41%, Ki-67 11%, HER-2 negative    Patient Stated Goals  I want to get the swelling down.    Currently in Pain?  No/denies    Pain Score  0-No pain    Pain Onset  1 to 4 weeks ago            LYMPHEDEMA/ONCOLOGY QUESTIONNAIRE - 06/28/19 1615      Left Upper Extremity Lymphedema   15 cm Proximal to Olecranon Process  20 cm    10 cm Proximal to  Olecranon Process  20 cm    Olecranon Process  20.5 cm    15 cm Proximal to Ulnar Styloid Process  20.6 cm    10 cm Proximal to Ulnar Styloid Process  18 cm    Just Proximal to Ulnar Styloid Process  14.5 cm    Across Hand at [employer]  15.8 cm    At [place] of 2nd Digit  4.7 cm           Outpatient Re[facility]  Lymphedema Life Impact Scale Total Score  32.35 %           [facility] Adult PT Treatment/Exercise - 06/28/19 0001      Manual Therapy   Manual Therapy  Compression Bandaging    Compression Bandaging  pt and husband were re-educated on wrapping; discussed performing exercises if she feels that the wrap is too tight before unwrapping and starting over. Then to re-start if she notices, numbness, tingling, pain, throbbing. Pt was wrapped with artiflex for base layer, elastomull at the fingers, and  tricofix sleeve. Added 2 pieces of 1/2 inch gray foam at the anterior antebrachium and elbow using 2 short stretch bandages from hand to axilla.              PT Education - 06/28/19 1644    Education Details  Pt and spouse will wrap arm for 2 more weeks at home and then return to physical therapy. They will call with any questions they may have or if they start to notice any negative changes such as pain or increased swelling. Discussed POC including possibly 1 more visit then compression sleeve.    Person(s) Educated  Patient;Spouse    Methods  Explanation;Demonstration;Verbal cues    Comprehension  Verbalized understanding       PT Short Term Goals - 06/25/19 1205      PT SHORT TERM GOAL #1   Title  Pt and spouse will be independent with self MLD and compressoin wrapping within 2 weeks for autonomy of care.    Baseline  ind with compression wrapping    Status  Partially Met        PT Long Term Goals - 06/23/19 1705      PT LONG TERM GOAL #1   Title  Pt will have 1.5 cm or greater reduction in circumferential measurements of the LUE in the brachium and proximal antebrachium to demonstrate decreased fluid.    Baseline  see measurements.    Time  4    Period  Weeks    Status  New    Target Date  07/28/19      PT LONG TERM GOAL #2   Title  Pt will have appropriate garment to wear on a daily basis for edema management.    Baseline  pt currently does not have a compression garment.    Time  4    Period  Weeks    Status  New    Target Date  07/28/19      PT LONG TERM GOAL #3   Title  Pt will demonstrate 140 degrees L shoulder flexion and 130 degrees L shoulder abduction to demonstrate improved functional ROM    Baseline  L shoulder flexion 122, L shoulder abduction 110    Time  4    Period  Weeks    Status  New    Target Date  07/28/19  Plan - 06/28/19 1603    Clinical Impression Statement  Pt has significant improvement in circumferential  measurements at the L elbow and dorsum of the hand since her initial evaluation. She has an overall reduction in circumferential measurements throughout. She has had good reduction with bottlenecking at the anteiror forearm and elbow with fibrotic changes noted. 1/2 inch gray foam was added and pt and spouse were educated on how to incorporate this into wrapping. Discussed POC including continueing to wrap at home to decrease antebrachium and elbow and then discharge to compression sleeve. Pt will benefit from continued POC at this time.    Personal Factors and Comorbidities  Comorbidity 3+    Comorbidities  Hx masectomy bil,  HX radiation and current chemotherapy    Rehab Potential  Good    PT Frequency  3x / week    PT Duration  4 weeks    PT Treatment/Interventions  Therapeutic activities;Therapeutic exercise;Neuromuscular re-education;Manual techniques;Compression bandaging;Manual lymph drainage    PT Next Visit Plan  how was foam?, recheck measurements, teach spouse MLD if needed, provide script for sleeve.    Consulted and Agree with Plan of Care  Patient;Family member/caregiver       Patient will benefit from skilled therapeutic intervention in order to improve the following deficits and impairments:  Pain, Increased edema, Decreased range of motion  Visit Diagnosis: Malignant neoplasm of lower-outer quadrant of left breast of female, estrogen receptor positive (HCC)  Postmastectomy lymphedema  Stiffness of left shoulder, not elsewhere classified  Pain in left arm     Problem List Patient Active Problem List   Diagnosis Date Noted  . Malignant neoplasm of lower-outer quadrant of left breast of female, estrogen receptor positive ([place]) 10/21/2018  . Goals of care, counseling/discussion 10/21/2018  . Bone metastasis ([place]) 12/22/2017  . Primary osteoarthritis of left hip 05/26/2017  . Osteoarthritis of left hip 05/23/2017  . Breast cancer of lower-outer quadrant of left female  breast ([place]) 08/06/2012    Elizabeth [name], PT 06/28/2019, 4:47 PM  [place], [place], [address] Phone: [phone]   Fax:  [phone]  Name: Elizabeth [name] MRN: [id number] Date of Birth: 1956-03-11

## 2019-06-28 NOTE — Progress Notes (Signed)
Noted patient's fluids have been canceled. Quick chart review reveals weight improved and was documented as 96.3 pounds on March 15, increased from 90.9 pounds March 1. Patient's appetite has improved on dexamethasone. Patient has my contact number if she develops questions or concerns.  I will continue to follow as needed.

## 2019-06-28 NOTE — Assessment & Plan Note (Signed)
Left breast cancer invasive ductal carcinoma 2.5 cm in size grade 1, ER 99%, PR 41%, Ki-67 11%, HER-2 negative T2 N0 M0 stage II a status post neoadjuvant chemotherapy followed by surgery February 2011and wason antiestrogen therapy with Femara from January 2011-July 2017  Treatment : 1. Palliative radiation to T11 vertebral body and the pectoralis muscle: 12/20/17-12/26/17 2. Ibrance with letrozole along with Delton See or Zometadiscontinued July 2020 due to progression Patient could not tolerate Faslodex because of intense bone pain as well as hypotension. 3. Halaven started 10/27/2018,completed 9 cycles as of 04/12/2019 discontinued for progression of disease.  04/05/2019:Guardant 360:PIK3CA mutation (alpelisib), NF1 S2 467 (everolimus) ARID1A S1791 (response to neratinib, olaparib, rucaparib, talazoparib), T p53 and SMAD4 Q534, TMB 35.41 Based on these results, it appears that she could respond to alpelisib as well as everolimus and olaparib as well as possibly immunotherapy.  CT CAP 04/30/2019: There is increased now nearly confluent left-sided pleural nodularity, increase in size of pericardial nodule 1.4 cm, enlargement of left axillary lymph nodes 1.4 cm, enlargement of left retroperitoneal lymph node 1.5 cm, no significant change in the sclerotic bone metastases. Radiation fibrosis in the lungs, loculated left pleural effusion with pleural thickening and nodularity. ---------------------------------------------------------------------------------------------------------------------- Current treatment: Alpelisib with Faslodex (250 mg because of prior intolerance) started 05/03/19 Alpelisib toxicities: 1.  Severe diarrhea: Currently on Imodium and probiotics. 2.  Nausea: Instructed her to take ondansetron. 3.  Decreased appetite: On dexamethasone.  Bone pain due to metastatic breast cancer: Continue with fentanyl and Percocets.  Return to clinic in 4 weeks for follow-up

## 2019-06-28 NOTE — Patient Instructions (Signed)
Fulvestrant injection What is this medicine? FULVESTRANT (ful VES trant) blocks the effects of estrogen. It is used to treat breast cancer. This medicine may be used for other purposes; ask your health care provider or pharmacist if you have questions. COMMON BRAND NAME(S): FASLODEX What should I tell my health care provider before I take this medicine? They need to know if you have any of these conditions:  bleeding disorders  liver disease  low blood counts, like low white cell, platelet, or red cell counts  an unusual or allergic reaction to fulvestrant, other medicines, foods, dyes, or preservatives  pregnant or trying to get pregnant  breast-feeding How should I use this medicine? This medicine is for injection into a muscle. It is usually given by a health care professional in a hospital or clinic setting. Talk to your pediatrician regarding the use of this medicine in children. Special care may be needed. Overdosage: If you think you have taken too much of this medicine contact a poison control center or emergency room at once. NOTE: This medicine is only for you. Do not share this medicine with others. What if I miss a dose? It is important not to miss your dose. Call your doctor or health care professional if you are unable to keep an appointment. What may interact with this medicine?  medicines that treat or prevent blood clots like warfarin, enoxaparin, dalteparin, apixaban, dabigatran, and rivaroxaban This list may not describe all possible interactions. Give your health care provider a list of all the medicines, herbs, non-prescription drugs, or dietary supplements you use. Also tell them if you smoke, drink alcohol, or use illegal drugs. Some items may interact with your medicine. What should I watch for while using this medicine? Your condition will be monitored carefully while you are receiving this medicine. You will need important blood work done while you are taking  this medicine. Do not become pregnant while taking this medicine or for at least 1 year after stopping it. Women of child-bearing potential will need to have a negative pregnancy test before starting this medicine. Women should inform their doctor if they wish to become pregnant or think they might be pregnant. There is a potential for serious side effects to an unborn child. Men should inform their doctors if they wish to father a child. This medicine may lower sperm counts. Talk to your health care professional or pharmacist for more information. Do not breast-feed an infant while taking this medicine or for 1 year after the last dose. What side effects may I notice from receiving this medicine? Side effects that you should report to your doctor or health care professional as soon as possible:  allergic reactions like skin rash, itching or hives, swelling of the face, lips, or tongue  feeling faint or lightheaded, falls  pain, tingling, numbness, or weakness in the legs  signs and symptoms of infection like fever or chills; cough; flu-like symptoms; sore throat  vaginal bleeding Side effects that usually do not require medical attention (report to your doctor or health care professional if they continue or are bothersome):  aches, pains  constipation  diarrhea  headache  hot flashes  nausea, vomiting  pain at site where injected  stomach pain This list may not describe all possible side effects. Call your doctor for medical advice about side effects. You may report side effects to FDA at 1-800-FDA-1088. Where should I keep my medicine? This drug is given in a hospital or clinic and will   not be stored at home. NOTE: This sheet is a summary. It may not cover all possible information. If you have questions about this medicine, talk to your doctor, pharmacist, or health care provider.  2020 Elsevier/Gold Standard (2017-07-10 11:34:41)  

## 2019-07-01 ENCOUNTER — Telehealth: Payer: Self-pay | Admitting: Hematology and Oncology

## 2019-07-01 NOTE — Telephone Encounter (Signed)
Scheduled per los, patient has been called and notified. 

## 2019-07-07 MED FILL — PIQRAY 300MG DAILY DOSE 2X1: 2 X 150 | 28 days supply | Qty: 56 | Fill #1

## 2019-07-12 ENCOUNTER — Ambulatory Visit: Payer: Medicare Other

## 2019-07-12 ENCOUNTER — Ambulatory Visit: Payer: Medicare Other | Admitting: Adult Health

## 2019-07-12 ENCOUNTER — Inpatient Hospital Stay (HOSPITAL_BASED_OUTPATIENT_CLINIC_OR_DEPARTMENT_OTHER): Payer: Medicare Other | Admitting: Medical

## 2019-07-12 ENCOUNTER — Other Ambulatory Visit: Payer: Medicare Other

## 2019-07-12 ENCOUNTER — Telehealth: Payer: Self-pay | Admitting: *Deleted

## 2019-07-12 ENCOUNTER — Other Ambulatory Visit: Payer: Self-pay

## 2019-07-12 VITALS — BP 115/75 | HR 100 | Temp 97.8°F | Resp 18 | Ht 63.0 in | Wt 95.8 lb

## 2019-07-12 DIAGNOSIS — L989 Disorder of the skin and subcutaneous tissue, unspecified: Secondary | ICD-10-CM

## 2019-07-12 DIAGNOSIS — C50512 Malignant neoplasm of lower-outer quadrant of left female breast: Secondary | ICD-10-CM

## 2019-07-12 DIAGNOSIS — C7951 Secondary malignant neoplasm of bone: Secondary | ICD-10-CM | POA: Diagnosis not present

## 2019-07-12 DIAGNOSIS — M79602 Pain in left arm: Secondary | ICD-10-CM

## 2019-07-12 DIAGNOSIS — M25612 Stiffness of left shoulder, not elsewhere classified: Secondary | ICD-10-CM

## 2019-07-12 DIAGNOSIS — I972 Postmastectomy lymphedema syndrome: Secondary | ICD-10-CM

## 2019-07-12 DIAGNOSIS — Z170 Estrogen receptor positive status [ER+]: Secondary | ICD-10-CM

## 2019-07-12 MED ORDER — MUPIROCIN 2 % EX OINT: TOPICAL_OINTMENT | CUTANEOUS | 0 refills | Status: DC

## 2019-07-12 NOTE — Telephone Encounter (Signed)
This RN spoke with pt per her call stating she has a new lump on her neck that is tender. She states it " gets irritated if I accidentally scratch it "  She called a  Dermatologist but cannot be seen until late May " unless a doctor calls to state it is an emergency "  She is concerned that it may be a skin cancer.  Pt is currently on alpeslisib and faslodex for metastatic breast cancer.  She is requesting to be seen by [facility] for above for evaluation and further recommendations.  This RN communicated with [place] and pt will come in today at 330 pm.  This RN will send URGENT appointment request - pt aware of appointment.

## 2019-07-12 NOTE — Progress Notes (Signed)
Patient seen by PA Lucianne Lei only.  No RN assessment at this time, PA aware.

## 2019-07-12 NOTE — Patient Instructions (Signed)
Deep Effective Breath   Standing, sitting, or laying down, place both hands on the belly. Take a deep breath IN, expanding the belly; then breath OUT, contracting the belly. Repeat __5__ times. Do __2-3__ sessions per day and before your self massage.  http://gt2.exer.us/866   Copyright  VHI. All rights reserved.  Axilla to Axilla - Sweep   On uninvolved side make 5 circles in the armpit, then pump _5__ times from involved armpit across chest (have hubby do across back) to uninvolved armpit, making a pathway. Do _1__ time per day.  Copyright  VHI. All rights reserved.  Axilla to Inguinal Nodes - Sweep   On involved side, make 5 circles at groin at panty line, then pump _5__ times from armpit along side of trunk to outer hip, making your other pathway. Do __1_ time per day.  Copyright  VHI. All rights reserved.  Arm Posterior: Elbow to Shoulder - Sweep   Pump _5__ times from back of elbow to top of shoulder. Then inner to outer upper arm _5_ times, then outer upper arm again _5_ times. Then back to the pathways _2-3_ times. Do _1__ time per day.  Copyright  VHI. All rights reserved.  ARM: [place] Wrist to Elbow - Sweep   Pump or stationary circles _5__ times from wrist to elbow making sure to do both sides of the forearm. Then retrace your steps to the outer arm, and the pathways _2-3_ times each. Do _1__ time per day.  Copyright  VHI. All rights reserved.  ARM: Dorsum of Hand to Shoulder - Sweep   Pump or stationary circles _5__ times on back of hand including knuckle spaces and individual fingers if needed working up towards the wrist, then retrace all your steps working back up the forearm, doing both sides; upper outer arm and back to your pathways _2-3_ times each. Then do 5 circles again at uninvolved armpit and involved groin where you started! Good job!! Do __1_ time per day.  Copyright  VHI. All rights reserved.

## 2019-07-12 NOTE — Progress Notes (Addendum)
[facility]pt[facility] Progress Note   KIARRA SENDELBACH [id number] Dec 26, 1955 64 y.o.  Elizabeth [name] is managed by Dr. Nicholas Lose  Actively treated with chemotherapy/immunotherapy/hormonal therapy: yes  Current therapy: Letrozole, Alpelisib and Faslodex   Last treated: 06/28/2019 (Faslodex treatment #10)  Next scheduled appointment with provider: 07/26/2019  Assessment: Plan:    Skin lesion of neck - Plan: Ambulatory referral to Dermatology  Bone metastasis ([place])  Malignant neoplasm of lower-outer quadrant of left female breast, unspecified estrogen receptor status ([place])   Skin lesion above the anterior neck: The patient was given a prescription for Bactroban ointment to use 4 times daily.  [facility] dermatology was contacted and asked to see the patient as she was told that she could not get into the office there until May unless a provider requested an appointment sooner for her.  Malignant breast cancer with bone metastasis: The patient continues to be followed by Dr. Lindi Adie and is treated with letrozole, Alpelisib and Faslodex.  She is scheduled to be seen in follow-up on 07/26/2019.  Please see After Visit Summary for patient specific instructions.  Future Appointments  Date Time Provider [facility]  07/26/2019  8:45 AM [facility] ONLY [facility] None  07/26/2019  9:15 AM Nicholas Lose, MD [facility] None  07/26/2019 10:15 AM [facility] INFUSION [facility] None  08/23/2019  8:45 AM [facility] None  08/23/2019  9:15 AM Nicholas Lose, MD [facility] None  08/23/2019 10:15 AM [facility] INFUSION [facility] None  09/20/2019  7:45 AM [facility] LAB 3 [facility] None  09/20/2019  8:15 AM Nicholas Lose, MD [facility] None  09/20/2019  9:15 AM [facility] INFUSION [facility] None  10/11/2019  8:30 AM [facility] None  10/11/2019  9:00 AM Nicholas Lose, MD [facility] None  10/11/2019 10:00 AM [facility] INFUSION [facility] None      Orders Placed This Encounter  Procedures  . Ambulatory referral to Dermatology       Subjective:   Patient ID:  MAURY GUERINO is a 64 y.o. (DOB 29-Jul-1955) female.  Chief Complaint:  Chief Complaint  Patient presents with  . Neck    neck nodule    HPI ONEKA [name]  is a 64 y.o. female with a diagnosis of a metastatic breast cancer with bone metastasis.  She is managed by Dr. Nicholas Lose and is treated with Letrozole, Alpelisib and Faslodex.  She presents to the clinic today with a scaling skin lesion at the base of her anterior neck.  She reports that she has had this area for several months and is noticed that it bleeds if scratched for exposed any trauma.  She does not recall any injuries or changes in activity.  She is concerned that this area could represent skin cancer.  She contacted [facility] dermatology who would not be able to see her until May.  She states that she was told that she could be seen earlier if she was referred by a medical provider.  She denies any purulent drainage, fevers, chills, or sweats.  Medications: I have reviewed the patient's current medications.  Allergies:  Allergies  Allergen Reactions  . Shellfish Allergy Anaphylaxis and Other (See Comments)    Allergist said she was "highly allergic to shellfish"  . Sulfa Antibiotics Rash and Other (See Comments)    Looks like severe sunburn    Past Medical History:  Diagnosis Date  . Arthritis   . Breast cancer ([place]) 2010   Left  . Chronic sinusitis   .  Family history of adverse reaction to anesthesia    sister-nausea/vomiting  . History of chemotherapy 01/2009-03/2009  . Hypercholesteremia   . Hypothyroid    "inactive thyroid"  . Metastasis ([place]) 11/2017   LN  . Metastatic cancer to bone ([place]) 11/2017  . Migraines   . RSD (reflex sympathetic dystrophy)     Past Surgical History:  Procedure Laterality Date  . BREAST RECONSTRUCTION    . EYE SURGERY Bilateral 7/18, 10/18  . IR  IMAGING GUIDED PORT INSERTION  10/28/2018  . IR INSTILL VIA CHEST TUBE AGENT FOR FIBRINOLYSIS INI DAY  10/09/2018  . IR PATIENT EVAL TECH 0-60 MINS  09/08/2018  . IR PERC PLEURAL DRAIN W/INDWELL CATH W/IMG GUIDE  08/05/2018  . IR RADIOLOGIST EVAL & MGMT  10/12/2018  . IR REMOVAL OF PLURAL CATH W/CUFF  10/26/2018  . KNEE ARTHROSCOPY Bilateral   . KNEE SURGERY Right    Two surgeries  . MASTECTOMY Bilateral 05/22/2009  . MASTECTOMY Bilateral 2011   restrictions on left arm-no labs/bloodpressure  . NASAL SINUS SURGERY    . PORTA CATH INSERTION  12/2008  . PORTA CATH REMOVAL  05/2009  . ROOT CANAL    . SHOULDER SURGERY Right   . TOTAL HIP ARTHROPLASTY Left 05/26/2017   Procedure: TOTAL HIP ARTHROPLASTY ANTERIOR APPROACH;  Surgeon: Frederik Pear, MD;  Location: [place];  Service: Orthopedics;  Laterality: Left;    Family History  Problem Relation Age of Onset  . Hypertension Mother     Social History   Socioeconomic History  . Marital status: Married    Spouse name: Not on file  . Number of children: Not on file  . Years of education: Not on file  . Highest education level: Not on file  Occupational History  . Not on file  Tobacco Use  . Smoking status: Never Smoker  . Smokeless tobacco: Never Used  Substance and Sexual Activity  . Alcohol use: No  . Drug use: No  . Sexual activity: Yes    Birth control/protection: Post-menopausal  Other Topics Concern  . Not on file  Social History Narrative  . Not on file   Social Determinants of Health   Financial Resource Strain:   . Difficulty of Paying Living Expenses:   Food Insecurity:   . Worried About Charity fundraiser in the Last Year:   . Arboriculturist in the Last Year:   Transportation Needs:   . Film/video editor (Medical):   Marland Kitchen Lack of Transportation (Non-Medical):   Physical Activity:   . Days of Exercise per Week:   . Minutes of Exercise per Session:   Stress:   . Feeling of Stress :   Social Connections:   .  Frequency of Communication with Friends and Family:   . Frequency of Social Gatherings with Friends and Family:   . Attends Religious Services:   . Active Member of Clubs or Organizations:   . Attends Archivist Meetings:   Marland Kitchen Marital Status:   Intimate Partner Violence:   . Fear of Current or Ex-Partner:   . Emotionally Abused:   Marland Kitchen Physically Abused:   . Sexually Abused:     Past Medical History, Surgical history, Social history, and Family history were reviewed and updated as appropriate.   Please see review of systems for further details on the patient's review from today.   Review of Systems:  Review of Systems  Constitutional: Negative for activity change, chills, diaphoresis and  fever.  Skin: Negative for wound.       Scaling lesion over the inferior anterior neck.  The area easily bleeds with any trauma.  Lymphedema of the left upper extremity.    Objective:   Physical Exam:  BP 115/75 (BP Location: Left Arm, Patient Position: Sitting)   Pulse 100   Temp 97.8 F (36.6 C) (Temporal)   Resp 18   Ht 5\' 3"  (1.6 m)   Wt 95 lb 12.8 oz (43.5 kg)   SpO2 96%   BMI 16.97 kg/m  ECOG: 0  Physical Exam Constitutional:      General: She is not in acute distress.    Appearance: Normal appearance. She is not ill-appearing, toxic-appearing or diaphoretic.  HENT:     Head: Normocephalic and atraumatic.  Musculoskeletal:        General: Swelling present.     Comments: Lymphedema of the left upper extremity.  Skin:    Findings: Erythema and lesion present.     Comments: Scaling lesion of the anterior inferior neck.  Slight erythema is noted surrounding the lesion.  Neurological:     Mental Status: She is alert.     Coordination: Coordination normal.     Gait: Gait normal.  Psychiatric:        Mood and Affect: Mood normal.        Behavior: Behavior normal.        Thought Content: Thought content normal.        Judgment: Judgment normal.        Lab  Review:     Component Value Date/Time   NA 144 06/28/2019 0905   NA 142 04/11/2014 0804   K 3.6 06/28/2019 0905   K 4.0 04/11/2014 0804   CL 103 06/28/2019 0905   CL 105 08/06/2012 1313   CO2 30 06/28/2019 0905   CO2 28 04/11/2014 0804   GLUCOSE 114 (H) 06/28/2019 0905   GLUCOSE 82 04/11/2014 0804   GLUCOSE 100 (H) 08/06/2012 1313   BUN 13 06/28/2019 0905   BUN 13.4 04/11/2014 0804   CREATININE 0.99 06/28/2019 0905   CREATININE 0.8 04/11/2014 0804   CALCIUM 8.9 06/28/2019 0905   CALCIUM 9.1 04/11/2014 0804   PROT 6.5 06/28/2019 0905   PROT 6.6 04/11/2014 0804   ALBUMIN 3.3 (L) 06/28/2019 0905   ALBUMIN 3.7 04/11/2014 0804   AST 26 06/28/2019 0905   AST 16 04/11/2014 0804   ALT 20 06/28/2019 0905   ALT 16 04/11/2014 0804   ALKPHOS 110 06/28/2019 0905   ALKPHOS 91 04/11/2014 0804   BILITOT 0.4 06/28/2019 0905   BILITOT 0.48 04/11/2014 0804   GFRNONAA 60 (L) 06/28/2019 0905   GFRAA >60 06/28/2019 0905       Component Value Date/Time   WBC 5.3 06/28/2019 0905   WBC 2.4 (L) 10/28/2018 1300   RBC 4.07 06/28/2019 0905   HGB 12.5 06/28/2019 0905   HGB 13.0 04/11/2014 0804   HCT 38.2 06/28/2019 0905   HCT 39.5 04/11/2014 0804   PLT 540 (H) 06/28/2019 0905   PLT 359 04/11/2014 0804   MCV 93.9 06/28/2019 0905   MCV 93.4 04/11/2014 0804   MCH 30.7 06/28/2019 0905   MCHC 32.7 06/28/2019 0905   RDW 16.0 (H) 06/28/2019 0905   RDW 13.1 04/11/2014 0804   LYMPHSABS 1.0 06/28/2019 0905   LYMPHSABS 1.8 04/11/2014 0804   MONOABS 0.6 06/28/2019 0905   MONOABS 0.5 04/11/2014 0804   EOSABS 0.4 06/28/2019 0905  EOSABS 0.2 04/11/2014 0804   BASOSABS 0.1 06/28/2019 0905   BASOSABS 0.0 04/11/2014 0804   -------------------------------  Imaging from last 24 hours (if applicable):  Radiology interpretation: DG Chest Left Decubitus  Result Date: 06/18/2019 CLINICAL DATA:  Metastatic breast cancer. Recurrent left pleural effusion. Shortness of breath. EXAM: CHEST - LEFT DECUBITUS  COMPARISON:  Radiographs 05/31/2019. CT 04/30/2019. FINDINGS: Single left-side-down lateral decubitus view of the chest demonstrates no significant layering of the left-sided pleural effusion which is predominately sub pulmonic. There is mild extension into the fissure. Overall volume appears similar to the most recent radiographs. Underlying left basilar airspace disease does not appear significantly changed. The right lung is clear. There is no right-sided pleural effusion or pneumothorax. Right IJ Port-A-Cath extends to the level of the superior cavoatrial junction. IMPRESSION: No significant layering of left-sided pleural effusion consistent with loculation. Overall volume similar to recent prior studies. Electronically Signed   By: Richardean Sale M.D.   On: 06/18/2019 11:48   VAS [place] UPPER EXTREMITY VENOUS DUPLEX  Result Date: 06/18/2019 UPPER VENOUS STUDY  Indications: Swelling Risk Factors: Cancer. Limitations: Body habitus. Comparison Study: No prior studies. Performing Technologist: Oliver Hum RVT  Examination Guidelines: A complete evaluation includes B-mode imaging, spectral Doppler, color Doppler, and power Doppler as needed of all accessible portions of each vessel. Bilateral testing is considered an integral part of a complete examination. Limited examinations for reoccurring indications may be performed as noted.  Right Findings: +----------+------------+---------+-----------+----------+-------+ RIGHT     CompressiblePhasicitySpontaneousPropertiesSummary +----------+------------+---------+-----------+----------+-------+ Subclavian    Full       Yes       Yes                      +----------+------------+---------+-----------+----------+-------+  Left Findings: +----------+------------+---------+-----------+----------+-------+ LEFT      CompressiblePhasicitySpontaneousPropertiesSummary +----------+------------+---------+-----------+----------+-------+ IJV           Full        Yes       Yes                      +----------+------------+---------+-----------+----------+-------+ Subclavian    Full       Yes       Yes                      +----------+------------+---------+-----------+----------+-------+ Axillary      Full       Yes       Yes                      +----------+------------+---------+-----------+----------+-------+ Brachial      Full       Yes       Yes                      +----------+------------+---------+-----------+----------+-------+ Radial        Full                                          +----------+------------+---------+-----------+----------+-------+ Ulnar         Full                                          +----------+------------+---------+-----------+----------+-------+ Cephalic      Full                                          +----------+------------+---------+-----------+----------+-------+  Basilic       Full                                          +----------+------------+---------+-----------+----------+-------+  Summary:  Right: No evidence of thrombosis in the subclavian.  Left: No evidence of deep vein thrombosis in the upper extremity. No evidence of superficial vein thrombosis in the upper extremity.  *See table(s) above for measurements and observations.  Diagnosing physician: Monica Martinez MD Electronically signed by Monica Martinez MD on 06/18/2019 at 4:38:14 PM.    Final

## 2019-07-12 NOTE — Patient Instructions (Signed)

## 2019-07-12 NOTE — Therapy (Signed)
[place], [place], [address] Phone: [phone]   Fax:  [phone]  Physical Therapy Treatment  Patient Details  Name: Elizabeth [name] MRN: [id number] Date of Birth: 11-15-1955 Referring Provider (PT): Sandi Mealy PA   Encounter Date: 07/12/2019  PT End of Session - 07/12/19 1542    Visit Number  4    Number of Visits  13    Date for PT Re-Evaluation  07/28/19    PT Start Time  4239    PT Stop Time  1520    PT Time Calculation (min)  73 min    Activity Tolerance  Patient tolerated treatment well    Behavior During Therapy  [facility] for tasks assessed/performed       Past Medical History:  Diagnosis Date  . Arthritis   . Breast cancer ([place]) 2010   Left  . Chronic sinusitis   . Family history of adverse reaction to anesthesia    sister-nausea/vomiting  . History of chemotherapy 01/2009-03/2009  . Hypercholesteremia   . Hypothyroid    "inactive thyroid"  . Metastasis ([place]) 11/2017   LN  . Metastatic cancer to bone ([place]) 11/2017  . Migraines   . RSD (reflex sympathetic dystrophy)     Past Surgical History:  Procedure Laterality Date  . BREAST RECONSTRUCTION    . EYE SURGERY Bilateral 7/18, 10/18  . IR IMAGING GUIDED PORT INSERTION  10/28/2018  . IR INSTILL VIA CHEST TUBE AGENT FOR FIBRINOLYSIS INI DAY  10/09/2018  . IR PATIENT EVAL TECH 0-60 MINS  09/08/2018  . IR PERC PLEURAL DRAIN W/INDWELL CATH W/IMG GUIDE  08/05/2018  . IR RADIOLOGIST EVAL & MGMT  10/12/2018  . IR REMOVAL OF PLURAL CATH W/CUFF  10/26/2018  . KNEE ARTHROSCOPY Bilateral   . KNEE SURGERY Right    Two surgeries  . MASTECTOMY Bilateral 05/22/2009  . MASTECTOMY Bilateral 2011   restrictions on left arm-no labs/bloodpressure  . NASAL SINUS SURGERY    . PORTA CATH INSERTION  12/2008  . PORTA CATH REMOVAL  05/2009  . ROOT CANAL    . SHOULDER SURGERY Right   . TOTAL HIP ARTHROPLASTY Left 05/26/2017   Procedure: TOTAL HIP  ARTHROPLASTY ANTERIOR APPROACH;  Surgeon: Frederik Pear, MD;  Location: [place];  Service: Orthopedics;  Laterality: Left;    There were no vitals filed for this visit.  Subjective Assessment - 07/12/19 1416    Subjective  We've been wrapping at home and want ot see if I'm ready to mbe measured for compression sleeve/glove.    Pertinent History  Metastatic breast cancer with 1 lymph node removed 10 years ago. Pt had radiation in 01/2019 on her L upper quadrant, R hip and in her L cervical spine. Bilateral mastectomy stage IIB T2 N0 M0 IDC left breast grade 1, 2.5 cm, ER 99%, PR 41%, Ki-67 11%, HER-2 negative    Patient Stated Goals  I want to get the swelling down.    Currently in Pain?  No/denies            LYMPHEDEMA/ONCOLOGY QUESTIONNAIRE - 07/12/19 1417      Left Upper Extremity Lymphedema   15 cm Proximal to Olecranon Process  19.4 cm    10 cm Proximal to Olecranon Process  18.5 cm    Olecranon Process  21.2 cm    15 cm Proximal to Ulnar Styloid Process  20.9 cm    10 cm Proximal to Ulnar Styloid Process  17.4  cm    Just Proximal to Ulnar Styloid Process  13.9 cm    Across Hand at [employer]  16.2 cm    At [place] of 2nd Digit  4.7 cm           Outpatient Re[facility]  Lymphedema Life Impact Scale Total Score  32.35 %           [facility] Adult PT Treatment/Exercise - 07/12/19 0001      Manual Therapy   Manual Therapy  Manual Lymphatic Drainage (MLD);Compression Bandaging    Manual Lymphatic Drainage (MLD)  In Supine: Short neck, 5 diaphragmatic breaths, Lt inguinal and Rt axillary nodes, Lt axillo-inguinal and anterior inter-axillary (briefly) anastomosis, though decided poster inter-axillary will be better for pt due to port location so instructed husband and pt this for home, then Lt UE working from lateral upper arm to dorsum of hand working from proximal to distal then retracing all steps and instructing  husband and pt thrughout. Had husband return demonstration on pt and therapists arm to assess technique per his request.    Compression Bandaging  Thick massage cream, thin stockinette, Elastomull to fingers 1-4 with 1/4" gray compression foam to dorsum of hand, then Artiflex from hand to axilla with 1/4" gray compression foam to anterior and posterior forearm covering to above olecranon process, then 1-6, 1-10 and 1-12 cm short stretch compression bandages from hand to axilla with husband watching and asking questions throughout.              PT Education - 07/12/19 1557    Education Details  Instructed husband and pt in self MLD    Person(s) Educated  Patient;Spouse    Methods  Explanation;Demonstration;Handout    Comprehension  Verbalized understanding;Returned demonstration;Verbal cues required;Tactile cues required;Need further instruction       PT Short Term Goals - 06/25/19 1205      PT SHORT TERM GOAL #1   Title  Pt and spouse will be independent with self MLD and compressoin wrapping within 2 weeks for autonomy of care.    Baseline  ind with compression wrapping    Status  Partially Met        PT Long Term Goals - 06/23/19 1705      PT LONG TERM GOAL #1   Title  Pt will have 1.5 cm or greater reduction in circumferential measurements of the LUE in the brachium and proximal antebrachium to demonstrate decreased fluid.    Baseline  see measurements.    Time  4    Period  Weeks    Status  New    Target Date  07/28/19      PT LONG TERM GOAL #2   Title  Pt will have appropriate garment to wear on a daily basis for edema management.    Baseline  pt currently does not have a compression garment.    Time  4    Period  Weeks    Status  New    Target Date  07/28/19      PT LONG TERM GOAL #3   Title  Pt will demonstrate 140 degrees L shoulder flexion and 130 degrees L shoulder abduction to demonstrate improved functional ROM    Baseline  L shoulder flexion 122, L  shoulder abduction 110    Time  4    Period  Weeks    Status  New    Target Date  07/28/19  Plan - 07/12/19 1543    Clinical Impression Statement  Pt and husband arrive with pts Lt UE bandaged and wanting to know if she is ready to be measured for compression garments tomorrow. Upon inspecting and measuring her Lt UE it was determined that with application gray compression foam today and after MLD that pt should be ready to be measured for compression garmnets tomorrow. She had some increase at hand, 15 cm proximal from ulnar, and at olecranon process that all visually reduced very well today during MLD. Also instructed husband in this as well having him return demo which, other than cuing for lighter pressure, was very good. Issued signed script for compression garments, Alight form and self MLD handout. Pt will benefit from one more visit for review of MLD and assess fit of new garments if she has them. Pt reports her Lt UE felt good after MLD.    Personal Factors and Comorbidities  Comorbidity 3+    Comorbidities  Hx masectomy bil,  HX radiation and current chemotherapy    Stability/Clinical Decision Making  Evolving/Moderate complexity    Rehab Potential  Good    PT Frequency  3x / week    PT Duration  4 weeks    PT Treatment/Interventions  Therapeutic activities;Therapeutic exercise;Neuromuscular re-education;Manual techniques;Compression bandaging;Manual lymph drainage    PT Next Visit Plan  If pt returns review MLD with her and husband; did she get measured for compression garmnets? If has them assess fit. Probable D/C next.    Consulted and Agree with Plan of Care  Patient;Family member/caregiver    Family Member Consulted  Husband       Patient will benefit from skilled therapeutic intervention in order to improve the following deficits and impairments:  Pain, Increased edema, Decreased range of motion  Visit Diagnosis: Malignant neoplasm of lower-outer quadrant of  left breast of female, estrogen receptor positive ([facility])  Postmastectomy lymphedema  Stiffness of left shoulder, not elsewhere classified  Pain in left arm     Problem List Patient Active Problem List   Diagnosis Date Noted  . Malignant neoplasm of lower-outer quadrant of left breast of female, estrogen receptor positive ([place]) 10/21/2018  . Goals of care, counseling/discussion 10/21/2018  . Bone metastasis ([place]) 12/22/2017  . Primary osteoarthritis of left hip 05/26/2017  . Osteoarthritis of left hip 05/23/2017  . Breast cancer of lower-outer quadrant of left female breast ([place]) 08/06/2012    Otelia Limes, PTA 07/12/2019, 3:58 PM  [place], [place], [address] Phone: [phone]   Fax:  [phone]  Name: TERRIA DESCHEPPER MRN: [id number] Date of Birth: 1955/10/20

## 2019-07-19 ENCOUNTER — Other Ambulatory Visit: Payer: Self-pay

## 2019-07-19 ENCOUNTER — Ambulatory Visit: Payer: Medicare Other | Attending: Medical

## 2019-07-19 DIAGNOSIS — Z170 Estrogen receptor positive status [ER+]: Secondary | ICD-10-CM | POA: Insufficient documentation

## 2019-07-19 DIAGNOSIS — I972 Postmastectomy lymphedema syndrome: Secondary | ICD-10-CM | POA: Diagnosis present

## 2019-07-19 DIAGNOSIS — M25612 Stiffness of left shoulder, not elsewhere classified: Secondary | ICD-10-CM | POA: Diagnosis present

## 2019-07-19 DIAGNOSIS — C50512 Malignant neoplasm of lower-outer quadrant of left female breast: Secondary | ICD-10-CM

## 2019-07-19 DIAGNOSIS — M79602 Pain in left arm: Secondary | ICD-10-CM

## 2019-07-19 NOTE — Therapy (Signed)
[place], [place], [address] Phone: [phone]   Fax:  [phone]  Physical Therapy Treatment  Patient Details  Name: Elizabeth [name] MRN: [id number] Date of Birth: Mar 30, 1956 Referring Provider (PT): Sandi Mealy PA   Encounter Date: 07/19/2019  PT End of Session - 07/19/19 1715    Visit Number  5    Number of Visits  13    Date for PT Re-Evaluation  07/28/19    PT Start Time  3335    PT Stop Time  1709    PT Time Calculation (min)  64 min    Activity Tolerance  Patient tolerated treatment well    Behavior During Therapy  [facility] for tasks assessed/performed       Past Medical History:  Diagnosis Date  . Arthritis   . Breast cancer ([place]) 2010   Left  . Chronic sinusitis   . Family history of adverse reaction to anesthesia    sister-nausea/vomiting  . History of chemotherapy 01/2009-03/2009  . Hypercholesteremia   . Hypothyroid    "inactive thyroid"  . Metastasis ([place]) 11/2017   LN  . Metastatic cancer to bone ([place]) 11/2017  . Migraines   . RSD (reflex sympathetic dystrophy)     Past Surgical History:  Procedure Laterality Date  . BREAST RECONSTRUCTION    . EYE SURGERY Bilateral 7/18, 10/18  . IR IMAGING GUIDED PORT INSERTION  10/28/2018  . IR INSTILL VIA CHEST TUBE AGENT FOR FIBRINOLYSIS INI DAY  10/09/2018  . IR PATIENT EVAL TECH 0-60 MINS  09/08/2018  . IR PERC PLEURAL DRAIN W/INDWELL CATH W/IMG GUIDE  08/05/2018  . IR RADIOLOGIST EVAL & MGMT  10/12/2018  . IR REMOVAL OF PLURAL CATH W/CUFF  10/26/2018  . KNEE ARTHROSCOPY Bilateral   . KNEE SURGERY Right    Two surgeries  . MASTECTOMY Bilateral 05/22/2009  . MASTECTOMY Bilateral 2011   restrictions on left arm-no labs/bloodpressure  . NASAL SINUS SURGERY    . PORTA CATH INSERTION  12/2008  . PORTA CATH REMOVAL  05/2009  . ROOT CANAL    . SHOULDER SURGERY Right   . TOTAL HIP ARTHROPLASTY Left 05/26/2017   Procedure: TOTAL HIP  ARTHROPLASTY ANTERIOR APPROACH;  Surgeon: Frederik Pear, MD;  Location: [facility];  Service: Orthopedics;  Laterality: Left;    There were no vitals filed for this visit.  Subjective Assessment - 07/19/19 1610    Subjective  We have continued to wrap at home but lately I feel like we aren't doing as well as we were. But my forearm is better now (less circumference).    Pertinent History  Metastatic breast cancer with 1 lymph node removed 10 years ago. Pt had radiation in 01/2019 on her L upper quadrant, R hip and in her L cervical spine. Bilateral mastectomy stage IIB T2 N0 M0 IDC left breast grade 1, 2.5 cm, ER 99%, PR 41%, Ki-67 11%, HER-2 negative    Patient Stated Goals  I want to get the swelling down.    Currently in Pain?  No/denies                  Outpatient Re[facility]  Lymphedema Life Impact Scale Total Score  32.35 %           [facility] Adult PT Treatment/Exercise - 07/19/19 0001      Manual Therapy   Manual Lymphatic Drainage (MLD)  In Supine: Short neck,  5 diaphragmatic breaths, Lt inguinal and Rt axillary nodes, Lt axillo-inguinal, then Lt UE working from lateral upper arm to dorsum of hand working from proximal to distal then retracing all steps and instructing husband and pt thrughout.    Compression Bandaging  Thick massage cream, thin stockinette, Elastomull to fingers 1-4 with 1/4" gray compression foam to dorsum of hand, then Artiflex from hand to axilla, then 1-6, 1-10 and 1-12 cm short stretch compression bandages from hand to axilla with husband watching and asking questions throughout.                PT Short Term Goals - 06/25/19 1205      PT SHORT TERM GOAL #1   Title  Pt and spouse will be independent with self MLD and compressoin wrapping within 2 weeks for autonomy of care.    Baseline  ind with compression wrapping    Status  Partially Met        PT Long Term Goals - 06/23/19 1705       PT LONG TERM GOAL #1   Title  Pt will have 1.5 cm or greater reduction in circumferential measurements of the LUE in the brachium and proximal antebrachium to demonstrate decreased fluid.    Baseline  see measurements.    Time  4    Period  Weeks    Status  New    Target Date  07/28/19      PT LONG TERM GOAL #2   Title  Pt will have appropriate garment to wear on a daily basis for edema management.    Baseline  pt currently does not have a compression garment.    Time  4    Period  Weeks    Status  New    Target Date  07/28/19      PT LONG TERM GOAL #3   Title  Pt will demonstrate 140 degrees L shoulder flexion and 130 degrees L shoulder abduction to demonstrate improved functional ROM    Baseline  L shoulder flexion 122, L shoulder abduction 110    Time  4    Period  Weeks    Status  New    Target Date  07/28/19            Plan - 07/19/19 1718    Clinical Impression Statement  Pt came in with bandages on that husband applied. Her hand bandage was sliding off due to very little coverage here, most of 6 cm was on forearm and Elastomull was not covering dorsum of hand effectively. Continued with complete decongestive therapy focusing on increased circumference at dorsal hand and reviewing all with pt and husband throughout. Husband was very attentinve in watching and asking questions while therapist applied bandages today as well and reviewing with him proper finger and hand bandaging. Encouraged pt to R/S appt to be measured for compression sleeve and glove as she has reached maximal reduction. She verbalized understanding.    Personal Factors and Comorbidities  Comorbidity 3+    Comorbidities  Hx masectomy bil,  HX radiation and current chemotherapy    Stability/Clinical Decision Making  Evolving/Moderate complexity    Rehab Potential  Good    PT Frequency  3x / week    PT Duration  4 weeks    PT Treatment/Interventions  Therapeutic activities;Therapeutic  exercise;Neuromuscular re-education;Manual techniques;Compression bandaging;Manual lymph drainage    PT Next Visit Plan  Remeasure circumference next. Cont complete decongestive therapy next session reviewing MLD or  bandaging prn. Did pt get new appt to be measured?    Consulted and Agree with Plan of Care  Patient;Family member/caregiver    Family Member Consulted  Husband       Patient will benefit from skilled therapeutic intervention in order to improve the following deficits and impairments:  Pain, Increased edema, Decreased range of motion  Visit Diagnosis: Malignant neoplasm of lower-outer quadrant of left breast of female, estrogen receptor positive (HCC)  Postmastectomy lymphedema  Stiffness of left shoulder, not elsewhere classified  Pain in left arm     Problem List Patient Active Problem List   Diagnosis Date Noted  . Malignant neoplasm of lower-outer quadrant of left breast of female, estrogen receptor positive ([place]) 10/21/2018  . Goals of care, counseling/discussion 10/21/2018  . Bone metastasis ([place]) 12/22/2017  . Primary osteoarthritis of left hip 05/26/2017  . Osteoarthritis of left hip 05/23/2017  . Breast cancer of lower-outer quadrant of left female breast ([place]) 08/06/2012    Otelia Limes, PTA 07/19/2019, 5:23 PM  [place], [place], [address] Phone: [phone]   Fax:  [phone]  Name: Elizabeth [name] MRN: [id number] Date of Birth: 08-26-1955

## 2019-07-22 ENCOUNTER — Ambulatory Visit: Payer: Medicare Other

## 2019-07-22 ENCOUNTER — Other Ambulatory Visit: Payer: Self-pay

## 2019-07-22 DIAGNOSIS — M25612 Stiffness of left shoulder, not elsewhere classified: Secondary | ICD-10-CM

## 2019-07-22 DIAGNOSIS — M79602 Pain in left arm: Secondary | ICD-10-CM

## 2019-07-22 DIAGNOSIS — I972 Postmastectomy lymphedema syndrome: Secondary | ICD-10-CM

## 2019-07-22 DIAGNOSIS — C50512 Malignant neoplasm of lower-outer quadrant of left female breast: Secondary | ICD-10-CM | POA: Diagnosis not present

## 2019-07-22 NOTE — Therapy (Signed)
[place], [place], [address] Phone: [phone]   Fax:  [phone]  Physical Therapy Treatment  Patient Details  Name: Elizabeth [name] MRN: [id number] Date of Birth: [birth year] Referring Provider (PT): Sandi Mealy PA   Encounter Date: 07/22/2019  PT End of Session - 07/22/19 1226    Visit Number  6    Number of Visits  13    Date for PT Re-Evaluation  07/28/19    PT Start Time  1106    PT Stop Time  1210    PT Time Calculation (min)  64 min    Activity Tolerance  Patient tolerated treatment well    Behavior During Therapy  [facility] for tasks assessed/performed       Past Medical History:  Diagnosis Date  . Arthritis   . Breast cancer ([place]) 2010   Left  . Chronic sinusitis   . Family history of adverse reaction to anesthesia    sister-nausea/vomiting  . History of chemotherapy 01/2009-03/2009  . Hypercholesteremia   . Hypothyroid    "inactive thyroid"  . Metastasis ([place]) 11/2017   LN  . Metastatic cancer to bone ([place]) 11/2017  . Migraines   . RSD (reflex sympathetic dystrophy)     Past Surgical History:  Procedure Laterality Date  . BREAST RECONSTRUCTION    . EYE SURGERY Bilateral 7/18, 10/18  . IR IMAGING GUIDED PORT INSERTION  10/28/2018  . IR INSTILL VIA CHEST TUBE AGENT FOR FIBRINOLYSIS INI DAY  10/09/2018  . IR PATIENT EVAL TECH 0-60 MINS  09/08/2018  . IR PERC PLEURAL DRAIN W/INDWELL CATH W/IMG GUIDE  08/05/2018  . IR RADIOLOGIST EVAL & MGMT  10/12/2018  . IR REMOVAL OF PLURAL CATH W/CUFF  10/26/2018  . KNEE ARTHROSCOPY Bilateral   . KNEE SURGERY Right    Two surgeries  . MASTECTOMY Bilateral 05/22/2009  . MASTECTOMY Bilateral 2011   restrictions on left arm-no labs/bloodpressure  . NASAL SINUS SURGERY    . PORTA CATH INSERTION  12/2008  . PORTA CATH REMOVAL  05/2009  . ROOT CANAL    . SHOULDER SURGERY Right   . TOTAL HIP ARTHROPLASTY Left 05/26/2017   Procedure: TOTAL HIP  ARTHROPLASTY ANTERIOR APPROACH;  Surgeon: Frederik Pear, MD;  Location: [facility];  Service: Orthopedics;  Laterality: Left;    Vitals:   07/22/19 1235  SpO2: 93%    Subjective Assessment - 07/22/19 1116    Subjective  We took the Monday bandage off Tuesday night and rewrapped it. He (my husband) did really good this time with my hand. I am getting measured for compresion garments when I leave here.    Pertinent History  Metastatic breast cancer with 1 lymph node removed 10 years ago. Pt had radiation in 01/2019 on her L upper quadrant, R hip and in her L cervical spine. Bilateral mastectomy stage IIB T2 N0 M0 IDC left breast grade 1, 2.5 cm, ER 99%, PR 41%, Ki-67 11%, HER-2 negative    Patient Stated Goals  I want to get the swelling down.    Currently in Pain?  No/denies            LYMPHEDEMA/ONCOLOGY QUESTIONNAIRE - 07/22/19 1119      Left Upper Extremity Lymphedema   15 cm Proximal to Olecranon Process  19.1 cm    10 cm Proximal to Olecranon Process  18.6 cm    Olecranon Process  20 cm    15 cm Proximal to Ulnar  Styloid Process  18.7 cm    10 cm Proximal to Ulnar Styloid Process  16.8 cm    Just Proximal to Ulnar Styloid Process  13.5 cm    Across Hand at [employer]  16.7 cm    At [place] of 2nd Digit  4.8 cm           Outpatient Re[facility] from [facility]23/2019 [facility]  Lymphedema Life Impact Scale Total Score  32.35 %           [facility] Adult PT Treatment/Exercise - 07/22/19 0001      Manual Therapy   Manual Lymphatic Drainage (MLD)  In Supine: Short neck, 5 diaphragmatic breaths, Lt inguinal and Rt axillary nodes, Lt axillo-inguinal, then Lt UE working from lateral upper arm to dorsum of hand, spending extra time here where more swelling present today, working from proximal to distal then retracing all steps               PT Short Term Goals - 06/25/19 1205      PT SHORT TERM GOAL #1   Title  Pt and spouse will be  independent with self MLD and compressoin wrapping within 2 weeks for autonomy of care.    Baseline  ind with compression wrapping    Status  Partially Met        PT Long Term Goals - 06/23/19 1705      PT LONG TERM GOAL #1   Title  Pt will have 1.5 cm or greater reduction in circumferential measurements of the LUE in the brachium and proximal antebrachium to demonstrate decreased fluid.    Baseline  see measurements.    Time  4    Period  Weeks    Status  New    Target Date  07/28/19      PT LONG TERM GOAL #2   Title  Pt will have appropriate garment to wear on a daily basis for edema management.    Baseline  pt currently does not have a compression garment.    Time  4    Period  Weeks    Status  New    Target Date  07/28/19      PT LONG TERM GOAL #3   Title  Pt will demonstrate 140 degrees L shoulder flexion and 130 degrees L shoulder abduction to demonstrate improved functional ROM    Baseline  L shoulder flexion 122, L shoulder abduction 110    Time  4    Period  Weeks    Status  New    Target Date  07/28/19            Plan - 07/22/19 1227    Clinical Impression Statement  Remeasured pts circumference and overall her UE has reduced well since last week and she is ready to be measured for compression garments. She has an appt to be measured today after session. Focused on manual lymph drainge to Lt UE, with extra time spent on hand where most swelling present today. She plans to schedule one more follow up after her new garments arrive to assess fit and determine if she is ready for D/C.    Personal Factors and Comorbidities  Comorbidity 3+    Comorbidities  Hx masectomy bil,  HX radiation and current chemotherapy    Stability/Clinical Decision Making  Evolving/Moderate complexity    Rehab Potential  Good    PT Frequency  3x / week  PT Duration  4 weeks    PT Treatment/Interventions  Therapeutic activities;Therapeutic exercise;Neuromuscular re-education;Manual  techniques;Compression bandaging;Manual lymph drainage    PT Next Visit Plan  When pt returns assess fit of new compression sleeve and glove and determine if ready for D/C.    Consulted and Agree with Plan of Care  Patient;Family member/caregiver    Family Member Consulted  Husband       Patient will benefit from skilled therapeutic intervention in order to improve the following deficits and impairments:  Pain, Increased edema, Decreased range of motion  Visit Diagnosis: Malignant neoplasm of lower-outer quadrant of left breast of female, estrogen receptor positive (HCC)  Postmastectomy lymphedema  Stiffness of left shoulder, not elsewhere classified  Pain in left arm     Problem List Patient Active Problem List   Diagnosis Date Noted  . Malignant neoplasm of lower-outer quadrant of left breast of female, estrogen receptor positive ([place]) 10/21/2018  . Goals of care, counseling/discussion 10/21/2018  . Bone metastasis ([place]) 12/22/2017  . Primary osteoarthritis of left hip 05/26/2017  . Osteoarthritis of left hip 05/23/2017  . Breast cancer of lower-outer quadrant of left female breast ([place]) 08/06/2012    Otelia Limes, PTA 07/22/2019, 12:38 PM  [place], [place], [address] Phone: [phone]   Fax:  [phone]  Name: Elizabeth [name] MRN: [id number] Date of Birth: 06-17-1955

## 2019-07-25 NOTE — Progress Notes (Signed)
Patient Care Team: [name], [place] as PCP - General (Nurse Practitioner)  DIAGNOSIS:    ICD-10-CM   1. Bone metastasis ([place])  C79.51   2. Malignant neoplasm of lower-outer quadrant of left breast of female, estrogen receptor positive ([name])  C50.512    Z17.0     SUMMARY OF ONCOLOGIC HISTORY: Oncology History  Breast cancer of lower-outer quadrant of left female breast (Elizabeth [name])  12/13/2008 Initial Diagnosis   Left breast biopsy: Invasive ductal carcinoma ER 90% percent, PR 41%, Ki-67 11%, HER-2 negative ratio 1, Oncotype DX score 23, ROR15%   01/27/2009 - 03/10/2009 Neo-Adjuvant Chemotherapy   Neoadjuvant FEC 4; participant in the "bed sheets" study.   05/12/2009 -  Anti-estrogen oral therapy   Femara 2.5 mg daily   05/22/2009 Surgery   Bilateral mastectomy stage IIB T2 N0 M0 IDC left breast grade 1, 2.5 cm, ER 99%, PR 41%, Ki-67 11%, HER-2 negative   12/10/2009 Surgery   Breast reconstruction surgery   11/14/2017 Relapse/Recurrence   Mid back pain: MRI revealed T11 severe pathologic fracture, abnormal signal T10, T11 and T12, moderate canal stenosis at T11   12/03/2017 PET scan   Hypermetabolic metastatic breast cancer involving thoracic and upper abdominal lymph nodes, spine and ribs. There may be hypermetabolic adenopathy in the left neck; Hypermetabolic lymph node versus intramuscular metastasis involving the medial left pectoralis musculature Pathologic T11 fracture     12/11/2017 Procedure   Biopsy of T11 vertebral body lytic lesion: Metastatic breast adenocarcinoma ER positive, PR negative   12/19/2017 - 12/26/2017 Radiation Therapy   Palliative XRT to T 11   01/12/2018 - 10/26/2018 Anti-estrogen oral therapy   Patient could not tolerate Faslodex because of severe hypotension and severe pain in the legs after injections.  Ibrance with letrozole starting 01/12/2018   10/27/2018 -  Chemotherapy   Palliative chemotherapy with Halaven   05/03/2019 -  Anti-estrogen oral therapy     Alpelisib with Faslodex   Malignant neoplasm of lower-outer quadrant of left breast of female, estrogen receptor positive (Elizabeth [name])  10/21/2018 Initial Diagnosis   Malignant neoplasm of lower-outer quadrant of left breast of female, estrogen receptor positive (Elizabeth [name])   10/27/2018 - 05/02/2019 Chemotherapy   The patient had eriBULin mesylate (HALAVEN) 1.65 mg in sodium chloride 0.9 % 100 mL chemo infusion, 1.1 mg/m2 = 2.1 mg, Intravenous,  Once, 9 of 10 cycles Dose modification: 1.1 mg/m2 (original dose 1.4 mg/m2, Cycle 1, Reason: Other (see comments), Comment: CrCL < 50) Administration: 1.65 mg (10/27/2018), 1.65 mg (11/03/2018), 1.65 mg (11/17/2018), 1.65 mg (11/24/2018), 1.65 mg (12/08/2018), 1.65 mg (12/15/2018), 1.65 mg (12/29/2018), 1.65 mg (01/18/2019), 1.65 mg (02/08/2019), 1.65 mg (03/01/2019), 1.65 mg (03/22/2019), 1.65 mg (04/12/2019)  for chemotherapy treatment.    11/17/2018 - 12/03/2018 Radiation Therapy   Palliative radiation to the T5 location of the spine and right femur, 35 Gy in 14 fractions   04/05/2019 Miscellaneous   Guardant 360:PIK3CA mutation (alpelisib), NF1 S2 467 (everolimus) ARID1A S1791 (response to neratinib, olaparib, rucaparib, talazoparib), T p53 and SMAD4 Q534, TMB 35.41 mutations /MB, no evidence of MSI high     CHIEF COMPLIANT: Follow-up of metastatic breast cancer  INTERVAL HISTORY: Elizabeth [name] is a 64 y.o. with above-mentioned history of metastatic breast cancerwhohas had recurrent pleural effusions.Sheis currently on treatment with AlpelisibandFaslodex.She presents to the clinic todayfor a toxicity check.   ALLERGIES:  is allergic to shellfish allergy and sulfa antibiotics.  MEDICATIONS:  Current Outpatient Medications  Medication Sig Dispense Refill  .  alpelisib (PIQRAY 300MG DAILY DOSE) 2 x 150 MG Therapy Pack Take two 162m tablets with food at the same time daily. Swallow whole, do not crush, chew, or split. 60 each 3  . Biotin 10000 MCG TABS Take 10,000  mcg by mouth daily.     . Cholecalciferol (VITAMIN D PO) Take 5,000 Units by mouth daily.     .Marland Kitchendexamethasone (DECADRON) 1 MG tablet Take 1 tablet (1 mg total) by mouth daily. 30 tablet 3  . DULoxetine (CYMBALTA) 30 MG capsule TAKE 1 CAPSULE (30 MG TOTAL) BY MOUTH 2 (TWO) TIMES DAILY. (Patient not taking: Reported on 06/23/2019) 180 capsule 2  . fentaNYL (DURAGESIC) 25 MCG/HR Place 1 patch onto the skin every 3 (three) days. 10 patch 0  . fluconazole (DIFLUCAN) 100 MG tablet Take 1 tablet (100 mg total) by mouth daily. 3 tablet 0  . gabapentin (NEURONTIN) 300 MG capsule Take 1 capsule (300 mg total) by mouth 2 (two) times daily. 180 capsule 2  . KLOR-CON M20 20 MEQ tablet TAKE 1 TABLET BY MOUTH EVERY DAY (Patient not taking: Reported on 06/23/2019) 90 tablet 0  . letrozole (FEMARA) 2.5 MG tablet Take 1 tablet (2.5 mg total) by mouth daily. 90 tablet 3  . levothyroxine (SYNTHROID, LEVOTHROID) 25 MCG tablet Take 25 mcg by mouth daily before breakfast.     . loperamide (IMODIUM) 2 MG capsule 1 to 2 PO QID prn diarrhea 30 capsule 22  . LORazepam (ATIVAN) 1 MG tablet SMARTSIG:1 Tablet(s) By Mouth As Needed    . magic mouthwash w/lidocaine SOLN Take 5 mLs by mouth 3 (three) times daily as needed for mouth pain. 100 mL 0  . Multiple Vitamin (MULTIVITAMIN) tablet Take 1 tablet by mouth daily.    . mupirocin ointment (BACTROBAN) 2 % Apply to skin lesion QID until better 30 g 0  . omeprazole (PRILOSEC) 20 MG capsule Take 1 capsule (20 mg total) by mouth daily. 90 capsule 0  . ondansetron (ZOFRAN) 8 MG tablet Take 1 tablet (8 mg total) by mouth every 8 (eight) hours as needed for nausea. 30 tablet 3  . oxyCODONE-acetaminophen (PERCOCET) 10-325 MG tablet Take 1 tablet by mouth every 8 (eight) hours as needed for pain. 90 tablet 0  . prednisoLONE acetate (PRED FORTE) 1 % ophthalmic suspension Place 1 drop into both eyes daily.    . prochlorperazine (COMPAZINE) 5 MG tablet Take 1 tablet (5 mg total) by mouth  every 6 (six) hours as needed for nausea or vomiting. (Patient not taking: Reported on 06/23/2019) 45 tablet 2  . simethicone (GAS-X) 80 MG chewable tablet Chew 1 tablet (80 mg total) by mouth every 6 (six) hours as needed for flatulence. 120 tablet 2  . traMADol (ULTRAM) 50 MG tablet Take 1 tablet (50 mg total) by mouth every 6 (six) hours as needed. 120 tablet 0   No current facility-administered medications for this visit.    PHYSICAL EXAMINATION: ECOG PERFORMANCE STATUS: 1 - Symptomatic but completely ambulatory  There were no vitals filed for this visit. There were no vitals filed for this visit.  LABORATORY DATA:  I have reviewed the data as listed CMP Latest Ref Rng & Units 06/28/2019 06/14/2019 05/31/2019  Glucose 70 - 99 mg/dL 114(H) 117(H) 155(H)  BUN 8 - 23 mg/dL _0 Creatinine 0.44 - 1.00 mg/dL 0.99 1.04(H) 1.14(H)  Sodium 135 - 145 mmol/L 144 140 139  Potassium 3.5 - 5.1 mmol/L 3.6 3.4(L) 4.0  Chloride 98 -  111 mmol/L 103 102 100  CO2 22 - 32 mmol/L _0 Calcium 8.9 - 10.3 mg/dL 8.9 8.8(L) 8.9  Total Protein 6.5 - 8.1 g/dL 6.5 6.8 7.2  Total Bilirubin 0.3 - 1.2 mg/dL 0.4 0.7 0.3  Alkaline Phos 38 - 126 U/L 110 93 104  AST 15 - 41 U/L _1 ALT 0 - 44 U/L _2 Lab Results  Component Value Date   WBC 5.3 06/28/2019   HGB 12.5 06/28/2019   HCT 38.2 06/28/2019   MCV 93.9 06/28/2019   PLT 540 (H) 06/28/2019   NEUTROABS 3.2 06/28/2019    ASSESSMENT & PLAN:  Malignant neoplasm of lower-outer quadrant of left breast of female, estrogen receptor positive ([facility]) Left breast cancer invasive ductal carcinoma 2.5 cm in size grade 1, ER 99%, PR 41%, Ki-67 11%, HER-2 negative T2 N0 M0 stage II a status post neoadjuvant chemotherapy followed by surgery February 2011and wason antiestrogen therapy with Femara from January 2011-July 2017  Treatment : 1. Palliative radiation to T11 vertebral body and the pectoralis muscle: 12/20/17-12/26/17 2. Ibrance with  letrozole along with Delton See or Zometadiscontinued July 2020 due to progression Patient could not tolerate Faslodex because of intense bone pain as well as hypotension. 3.Halaven started 10/27/2018,completed 9 cycles as of 04/12/2019 discontinued for progression of disease.  04/05/2019:Guardant 360:PIK3CA mutation (alpelisib), NF1 S2 467 (everolimus) ARID1A S1791 (response to neratinib, olaparib, rucaparib, talazoparib), T p53 and SMAD4 Q534, TMB 35.41 Based on these results, it appears that she could respond to alpelisib as well as everolimus and olaparib as well as possibly immunotherapy.  CT CAP 04/30/2019: There is increased now nearly confluent left-sided pleural nodularity, increase in size of pericardial nodule 1.4 cm, enlargement of left axillary lymph nodes 1.4 cm, enlargement of left retroperitoneal lymph node 1.5 cm, no significant change in the sclerotic bone metastases. Radiation fibrosis in the lungs, loculated left pleural effusion with pleural thickening and nodularity. ---------------------------------------------------------------------------------------------------------------------- Current treatment: Alpelisib with Faslodex (250 mg because of prior intolerance) started 05/03/19 Alpelisib toxicities: 1.Severe diarrhea: Currently on Imodium and probiotics.  When she takes 2 Imodium's every morning the diarrhea appears to be better 2.Nausea: Much better with ondansetron. 3.Decreased appetite: On dexamethasone.  Appetite has improved energy levels are still low 4.  Patient tells me that her blood sugars are under good control.  Difficulty with swallowing: Related to prior radiation to the chest Numbness in the right buttock area and shooting pains down the legs could be a pinched nerve.  I recommended that she get CT chest abdomen pelvis prior to her visit in 1 month. Bone metastases: Currently receiving Zometa along with calcium and vitamin D.  Tolerating it  well.  Bone pain due to metastatic breast cancer: Continue with Percocets.  I renewed her prescription for Percocet.  Return to clinic in4 weeksfor follow-up with injections and Zometa    No orders of the defined types were placed in this encounter.  The patient has a good understanding of the overall plan. she agrees with it. she will call with any problems that may develop before the next visit here.  Total time spent: 30 mins including face to face time and time spent for planning, charting and coordination of care  Nicholas Lose, MD 07/26/2019  I, Cloyde Reams Dorshimer, am acting as scribe for Dr. Nicholas Lose.  I have reviewed the above documentation for accuracy and completeness, and I agree with the above.

## 2019-07-26 ENCOUNTER — Inpatient Hospital Stay (HOSPITAL_BASED_OUTPATIENT_CLINIC_OR_DEPARTMENT_OTHER): Payer: Medicare Other | Admitting: Hematology and Oncology

## 2019-07-26 ENCOUNTER — Other Ambulatory Visit: Payer: Self-pay

## 2019-07-26 ENCOUNTER — Inpatient Hospital Stay: Payer: Medicare Other | Attending: Hematology and Oncology

## 2019-07-26 ENCOUNTER — Inpatient Hospital Stay: Payer: Medicare Other

## 2019-07-26 VITALS — BP 107/89 | HR 93 | Temp 98.5°F | Resp 17 | Ht 63.0 in | Wt 92.3 lb

## 2019-07-26 DIAGNOSIS — Z9221 Personal history of antineoplastic chemotherapy: Secondary | ICD-10-CM | POA: Insufficient documentation

## 2019-07-26 DIAGNOSIS — C7951 Secondary malignant neoplasm of bone: Secondary | ICD-10-CM | POA: Insufficient documentation

## 2019-07-26 DIAGNOSIS — Z79811 Long term (current) use of aromatase inhibitors: Secondary | ICD-10-CM | POA: Insufficient documentation

## 2019-07-26 DIAGNOSIS — C50512 Malignant neoplasm of lower-outer quadrant of left female breast: Secondary | ICD-10-CM

## 2019-07-26 DIAGNOSIS — Z9013 Acquired absence of bilateral breasts and nipples: Secondary | ICD-10-CM | POA: Diagnosis not present

## 2019-07-26 DIAGNOSIS — Z170 Estrogen receptor positive status [ER+]: Secondary | ICD-10-CM | POA: Insufficient documentation

## 2019-07-26 DIAGNOSIS — Z923 Personal history of irradiation: Secondary | ICD-10-CM | POA: Diagnosis not present

## 2019-07-26 DIAGNOSIS — Z79899 Other long term (current) drug therapy: Secondary | ICD-10-CM | POA: Diagnosis not present

## 2019-07-26 DIAGNOSIS — G893 Neoplasm related pain (acute) (chronic): Secondary | ICD-10-CM | POA: Diagnosis not present

## 2019-07-26 LAB — CMP (CANCER CENTER ONLY)
ALT: 18 U/L (ref 0–44)
AST: 25 U/L (ref 15–41)
Albumin: 3.4 g/dL — ABNORMAL LOW (ref 3.5–5.0)
Alkaline Phosphatase: 107 U/L (ref 38–126)
Anion gap: 9 (ref 5–15)
BUN: 14 mg/dL (ref 8–23)
CO2: 27 mmol/L (ref 22–32)
Calcium: 8.8 mg/dL — ABNORMAL LOW (ref 8.9–10.3)
Chloride: 106 mmol/L (ref 98–111)
Creatinine: 1.44 mg/dL — ABNORMAL HIGH (ref 0.44–1.00)
GFR, Est AFR Am: 44 mL/min — ABNORMAL LOW (ref >60)
GFR, Estimated: 38 mL/min — ABNORMAL LOW (ref >60)
Glucose, Bld: 129 mg/dL — ABNORMAL HIGH (ref 70–99)
Potassium: 3.7 mmol/L (ref 3.5–5.1)
Sodium: 142 mmol/L (ref 135–145)
Total Bilirubin: 0.4 mg/dL (ref 0.3–1.2)
Total Protein: 6.7 g/dL (ref 6.5–8.1)

## 2019-07-26 LAB — CBC WITH DIFFERENTIAL (CANCER CENTER ONLY)
Abs Immature Granulocytes: 0.01 10*3/uL (ref 0.00–0.07)
Basophils Absolute: 0.1 10*3/uL (ref 0.0–0.1)
Basophils Relative: 1 %
Eosinophils Absolute: 0.4 10*3/uL (ref 0.0–0.5)
Eosinophils Relative: 6 %
HCT: 37.2 % (ref 36.0–46.0)
Hemoglobin: 12.1 g/dL (ref 12.0–15.0)
Immature Granulocytes: 0 %
Lymphocytes Relative: 13 %
Lymphs Abs: 0.8 10*3/uL (ref 0.7–4.0)
MCH: 30.8 pg (ref 26.0–34.0)
MCHC: 32.5 g/dL (ref 30.0–36.0)
MCV: 94.7 fL (ref 80.0–100.0)
Monocytes Absolute: 0.6 10*3/uL (ref 0.1–1.0)
Monocytes Relative: 10 %
Neutro Abs: 4.3 10*3/uL (ref 1.7–7.7)
Neutrophils Relative %: 70 %
Platelet Count: 505 10*3/uL — ABNORMAL HIGH (ref 150–400)
RBC: 3.93 MIL/uL (ref 3.87–5.11)
RDW: 16.2 % — ABNORMAL HIGH (ref 11.5–15.5)
WBC Count: 6.1 10*3/uL (ref 4.0–10.5)
nRBC: 0 % (ref 0.0–0.2)

## 2019-07-26 MED ORDER — ZOLEDRONIC ACID 4 MG/5ML IV CONC
3.0000 mg | Freq: Once | INTRAVENOUS | Status: AC
  Administered 2019-07-26: 11:00:00 3 mg via INTRAVENOUS
  Filled 2019-07-26: qty 3.75

## 2019-07-26 MED ORDER — FULVESTRANT 250 MG/5ML IM SOLN
INTRAMUSCULAR | Status: AC
  Filled 2019-07-26: qty 5

## 2019-07-26 MED ORDER — FULVESTRANT 250 MG/5ML IM SOLN
250.0000 mg | Freq: Once | INTRAMUSCULAR | Status: AC
  Administered 2019-07-26: 250 mg via INTRAMUSCULAR

## 2019-07-26 MED ORDER — SODIUM CHLORIDE 0.9 % IV SOLN
Freq: Once | INTRAVENOUS | Status: AC
  Filled 2019-07-26: qty 250

## 2019-07-26 MED ORDER — HEPARIN SOD (PORK) LOCK FLUSH 100 UNIT/ML IV SOLN
500.0000 [IU] | Freq: Once | INTRAVENOUS | Status: AC | PRN
  Administered 2019-07-26: 11:00:00 500 [IU]
  Filled 2019-07-26: qty 5

## 2019-07-26 MED ORDER — ZOLEDRONIC ACID 4 MG/100ML IV SOLN
INTRAVENOUS | Status: AC
  Filled 2019-07-26: qty 100

## 2019-07-26 MED ORDER — OXYCODONE-ACETAMINOPHEN 10-325 MG PO TABS: 1.0000 | ORAL_TABLET | Freq: Three times a day (TID) | ORAL | 0 refills | Status: DC | PRN

## 2019-07-26 MED ORDER — SODIUM CHLORIDE 0.9% FLUSH
3.0000 mL | Freq: Once | INTRAVENOUS | Status: DC | PRN
  Filled 2019-07-26: qty 10

## 2019-07-26 MED ORDER — SODIUM CHLORIDE 0.9% FLUSH
10.0000 mL | INTRAVENOUS | Status: DC | PRN
  Administered 2019-07-26: 11:00:00 10 mL via INTRAVENOUS
  Filled 2019-07-26: qty 10

## 2019-07-26 NOTE — Patient Instructions (Signed)
Zoledronic Acid injection (Hypercalcemia, Oncology) What is this medicine? ZOLEDRONIC ACID (ZOE le dron ik AS id) lowers the amount of calcium loss from bone. It is used to treat too much calcium in your blood from cancer. It is also used to prevent complications of cancer that has spread to the bone. This medicine may be used for other purposes; ask your health care provider or pharmacist if you have questions. COMMON BRAND NAME(S): Zometa What should I tell my health care provider before I take this medicine? They need to know if you have any of these conditions:  aspirin-sensitive asthma  cancer, especially if you are receiving medicines used to treat cancer  dental disease or wear dentures  infection  kidney disease  receiving corticosteroids like dexamethasone or prednisone  an unusual or allergic reaction to zoledronic acid, other medicines, foods, dyes, or preservatives  pregnant or trying to get pregnant  breast-feeding How should I use this medicine? This medicine is for infusion into a vein. It is given by a health care professional in a hospital or clinic setting. Talk to your pediatrician regarding the use of this medicine in children. Special care may be needed. Overdosage: If you think you have taken too much of this medicine contact a poison control center or emergency room at once. NOTE: This medicine is only for you. Do not share this medicine with others. What if I miss a dose? It is important not to miss your dose. Call your doctor or health care professional if you are unable to keep an appointment. What may interact with this medicine?  certain antibiotics given by injection  NSAIDs, medicines for pain and inflammation, like ibuprofen or naproxen  some diuretics like bumetanide, furosemide  teriparatide  thalidomide This list may not describe all possible interactions. Give your health care provider a list of all the medicines, herbs, non-prescription  drugs, or dietary supplements you use. Also tell them if you smoke, drink alcohol, or use illegal drugs. Some items may interact with your medicine. What should I watch for while using this medicine? Visit your doctor or health care professional for regular checkups. It may be some time before you see the benefit from this medicine. Do not stop taking your medicine unless your doctor tells you to. Your doctor may order blood tests or other tests to see how you are doing. Women should inform their doctor if they wish to become pregnant or think they might be pregnant. There is a potential for serious side effects to an unborn child. Talk to your health care professional or pharmacist for more information. You should make sure that you get enough calcium and vitamin D while you are taking this medicine. Discuss the foods you eat and the vitamins you take with your health care professional. Some people who take this medicine have severe bone, joint, and/or muscle pain. This medicine may also increase your risk for jaw problems or a broken thigh bone. Tell your doctor right away if you have severe pain in your jaw, bones, joints, or muscles. Tell your doctor if you have any pain that does not go away or that gets worse. Tell your dentist and dental surgeon that you are taking this medicine. You should not have major dental surgery while on this medicine. See your dentist to have a dental exam and fix any dental problems before starting this medicine. Take good care of your teeth while on this medicine. Make sure you see your dentist for regular follow-up   appointments. What side effects may I notice from receiving this medicine? Side effects that you should report to your doctor or health care professional as soon as possible:  allergic reactions like skin rash, itching or hives, swelling of the face, lips, or tongue  anxiety, confusion, or depression  breathing problems  changes in vision  eye  pain  feeling faint or lightheaded, falls  jaw pain, especially after dental work  mouth sores  muscle cramps, stiffness, or weakness  redness, blistering, peeling or loosening of the skin, including inside the mouth  trouble passing urine or change in the amount of urine Side effects that usually do not require medical attention (report to your doctor or health care professional if they continue or are bothersome):  bone, joint, or muscle pain  constipation  diarrhea  fever  hair loss  irritation at site where injected  loss of appetite  nausea, vomiting  stomach upset  trouble sleeping  trouble swallowing  weak or tired This list may not describe all possible side effects. Call your doctor for medical advice about side effects. You may report side effects to FDA at 1-800-FDA-1088. Where should I keep my medicine? This drug is given in a hospital or clinic and will not be stored at home. NOTE: This sheet is a summary. It may not cover all possible information. If you have questions about this medicine, talk to your doctor, pharmacist, or health care provider.  2020 Elsevier/Gold Standard (2013-08-28 14:19:39) Fulvestrant injection What is this medicine? FULVESTRANT (ful VES trant) blocks the effects of estrogen. It is used to treat breast cancer. This medicine may be used for other purposes; ask your health care provider or pharmacist if you have questions. COMMON BRAND NAME(S): FASLODEX What should I tell my health care provider before I take this medicine? They need to know if you have any of these conditions:  bleeding disorders  liver disease  low blood counts, like low white cell, platelet, or red cell counts  an unusual or allergic reaction to fulvestrant, other medicines, foods, dyes, or preservatives  pregnant or trying to get pregnant  breast-feeding How should I use this medicine? This medicine is for injection into a muscle. It is usually  given by a health care professional in a hospital or clinic setting. Talk to your pediatrician regarding the use of this medicine in children. Special care may be needed. Overdosage: If you think you have taken too much of this medicine contact a poison control center or emergency room at once. NOTE: This medicine is only for you. Do not share this medicine with others. What if I miss a dose? It is important not to miss your dose. Call your doctor or health care professional if you are unable to keep an appointment. What may interact with this medicine?  medicines that treat or prevent blood clots like warfarin, enoxaparin, dalteparin, apixaban, dabigatran, and rivaroxaban This list may not describe all possible interactions. Give your health care provider a list of all the medicines, herbs, non-prescription drugs, or dietary supplements you use. Also tell them if you smoke, drink alcohol, or use illegal drugs. Some items may interact with your medicine. What should I watch for while using this medicine? Your condition will be monitored carefully while you are receiving this medicine. You will need important blood work done while you are taking this medicine. Do not become pregnant while taking this medicine or for at least 1 year after stopping it. Women of child-bearing potential   will need to have a negative pregnancy test before starting this medicine. Women should inform their doctor if they wish to become pregnant or think they might be pregnant. There is a potential for serious side effects to an unborn child. Men should inform their doctors if they wish to father a child. This medicine may lower sperm counts. Talk to your health care professional or pharmacist for more information. Do not breast-feed an infant while taking this medicine or for 1 year after the last dose. What side effects may I notice from receiving this medicine? Side effects that you should report to your doctor or health care  professional as soon as possible:  allergic reactions like skin rash, itching or hives, swelling of the face, lips, or tongue  feeling faint or lightheaded, falls  pain, tingling, numbness, or weakness in the legs  signs and symptoms of infection like fever or chills; cough; flu-like symptoms; sore throat  vaginal bleeding Side effects that usually do not require medical attention (report to your doctor or health care professional if they continue or are bothersome):  aches, pains  constipation  diarrhea  headache  hot flashes  nausea, vomiting  pain at site where injected  stomach pain This list may not describe all possible side effects. Call your doctor for medical advice about side effects. You may report side effects to FDA at 1-800-FDA-1088. Where should I keep my medicine? This drug is given in a hospital or clinic and will not be stored at home. NOTE: This sheet is a summary. It may not cover all possible information. If you have questions about this medicine, talk to your doctor, pharmacist, or health care provider.  2020 Elsevier/Gold Standard (2017-07-10 11:34:41)  

## 2019-07-26 NOTE — Progress Notes (Signed)
MD Lindi Adie ok with giving Zometa instead of waiting for results.  Larene Beach, PharmD

## 2019-07-26 NOTE — Assessment & Plan Note (Signed)
Left breast cancer invasive ductal carcinoma 2.5 cm in size grade 1, ER 99%, PR 41%, Ki-67 11%, HER-2 negative T2 N0 M0 stage II a status post neoadjuvant chemotherapy followed by surgery February 2011and wason antiestrogen therapy with Femara from January 2011-July 2017  Treatment : 1. Palliative radiation to T11 vertebral body and the pectoralis muscle: 12/20/17-12/26/17 2. Ibrance with letrozole along with Delton See or Zometadiscontinued July 2020 due to progression Patient could not tolerate Faslodex because of intense bone pain as well as hypotension. 3.Halaven started 10/27/2018,completed 9 cycles as of 04/12/2019 discontinued for progression of disease.  04/05/2019:Guardant 360:PIK3CA mutation (alpelisib), NF1 S2 467 (everolimus) ARID1A S1791 (response to neratinib, olaparib, rucaparib, talazoparib), T p53 and SMAD4 Q534, TMB 35.41 Based on these results, it appears that she could respond to alpelisib as well as everolimus and olaparib as well as possibly immunotherapy.  CT CAP 04/30/2019: There is increased now nearly confluent left-sided pleural nodularity, increase in size of pericardial nodule 1.4 cm, enlargement of left axillary lymph nodes 1.4 cm, enlargement of left retroperitoneal lymph node 1.5 cm, no significant change in the sclerotic bone metastases. Radiation fibrosis in the lungs, loculated left pleural effusion with pleural thickening and nodularity. ---------------------------------------------------------------------------------------------------------------------- Current treatment: Alpelisib with Faslodex (250 mg because of prior intolerance) started 05/03/19 Alpelisib toxicities: 1.Severe diarrhea: Currently on Imodium and probiotics.  When she takes 2 Imodium's every morning the diarrhea appears to be better 2.Nausea: Instructed her to take ondansetron. 3.Decreased appetite: On dexamethasone.  Appetite has improved energy levels are still low  Fasting blood  sugar today is 113.  Bone pain due to metastatic breast cancer: Continue with fentanyl and Percocets.  I renewed her prescription for Percocet.  Return to clinic in4 weeksfor follow-up with injections and Zometa

## 2019-07-28 ENCOUNTER — Ambulatory Visit: Payer: Medicare Other | Admitting: Physical Therapy

## 2019-08-04 MED FILL — PIQRAY 300MG DAILY DOSE 2X1: 2 X 150 | 28 days supply | Qty: 56 | Fill #2

## 2019-08-09 ENCOUNTER — Ambulatory Visit: Payer: Medicare Other | Admitting: Hematology and Oncology

## 2019-08-09 ENCOUNTER — Other Ambulatory Visit: Payer: Medicare Other

## 2019-08-09 ENCOUNTER — Ambulatory Visit: Payer: Medicare Other

## 2019-08-10 ENCOUNTER — Other Ambulatory Visit: Payer: Self-pay

## 2019-08-10 ENCOUNTER — Other Ambulatory Visit: Payer: Self-pay | Admitting: Hematology and Oncology

## 2019-08-10 DIAGNOSIS — R52 Pain, unspecified: Secondary | ICD-10-CM

## 2019-08-10 MED ORDER — LORAZEPAM 0.5 MG PO TABS: ORAL_TABLET | ORAL | 0 refills | Status: DC

## 2019-08-19 ENCOUNTER — Other Ambulatory Visit: Payer: Self-pay

## 2019-08-19 ENCOUNTER — Ambulatory Visit (HOSPITAL_COMMUNITY)
Admission: RE | Admit: 2019-08-19 | Discharge: 2019-08-19 | Disposition: A | Discharge status: Home / Self Care | Payer: Medicare Other | Source: Ambulatory Visit | Attending: Hematology and Oncology | Admitting: Hematology and Oncology

## 2019-08-19 DIAGNOSIS — C7951 Secondary malignant neoplasm of bone: Secondary | ICD-10-CM | POA: Diagnosis not present

## 2019-08-19 DIAGNOSIS — Z170 Estrogen receptor positive status [ER+]: Secondary | ICD-10-CM | POA: Insufficient documentation

## 2019-08-19 DIAGNOSIS — C50512 Malignant neoplasm of lower-outer quadrant of left female breast: Secondary | ICD-10-CM | POA: Insufficient documentation

## 2019-08-19 MED ORDER — SODIUM CHLORIDE (PF) 0.9 % IJ SOLN
INTRAMUSCULAR | Status: AC
  Filled 2019-08-19: qty 50

## 2019-08-19 MED ORDER — IOHEXOL 300 MG/ML  SOLN
100.0000 mL | Freq: Once | INTRAMUSCULAR | Status: AC | PRN
  Administered 2019-08-19: 75 mL via INTRAVENOUS

## 2019-08-22 NOTE — Progress Notes (Signed)
Patient Care Team: [name], [place] as PCP - General (Nurse Practitioner)  DIAGNOSIS:    ICD-10-CM   1. Bone metastasis ([place])  C79.51 For home use only DME oxygen  2. Malignant neoplasm of lower-outer quadrant of left female breast, unspecified estrogen receptor status ([place])  C50.512 For home use only DME oxygen  3. Malignant neoplasm of lower-outer quadrant of left breast of female, estrogen receptor positive ([facility])  C50.512 PHYSICIAN COMMUNICATION ORDER   Z17.0 PHYSICIAN COMMUNICATION ORDER    CBC with Differential (Cancer Center Only)    CMP ([place] only)    T4    TSH    lidocaine-prilocaine (EMLA) cream    prochlorperazine (COMPAZINE) 10 MG tablet  4. Goals of care, counseling/discussion  Z71.89 PHYSICIAN COMMUNICATION ORDER    PHYSICIAN COMMUNICATION ORDER    CBC with Differential (Cancer Center Only)    CMP ([facility] only)    T4    TSH    lidocaine-prilocaine (EMLA) cream    prochlorperazine (COMPAZINE) 10 MG tablet    SUMMARY OF ONCOLOGIC HISTORY: Oncology History  Breast cancer of lower-outer quadrant of left female breast ([place])  12/13/2008 Initial Diagnosis   Left breast biopsy: Invasive ductal carcinoma ER 90% percent, PR 41%, Ki-67 11%, HER-2 negative ratio 1, Oncotype DX score 23, ROR15%   01/27/2009 - 03/10/2009 Neo-Adjuvant Chemotherapy   Neoadjuvant FEC 4; participant in the "bed sheets" study.   05/12/2009 -  Anti-estrogen oral therapy   Femara 2.5 mg daily   05/22/2009 Surgery   Bilateral mastectomy stage IIB T2 N0 M0 IDC left breast grade 1, 2.5 cm, ER 99%, PR 41%, Ki-67 11%, HER-2 negative   12/10/2009 Surgery   Breast reconstruction surgery   11/14/2017 Relapse/Recurrence   Mid back pain: MRI revealed T11 severe pathologic fracture, abnormal signal T10, T11 and T12, moderate canal stenosis at T11   12/03/2017 PET scan   Hypermetabolic metastatic breast cancer involving thoracic and upper abdominal lymph nodes, spine and ribs. There may be  hypermetabolic adenopathy in the left neck; Hypermetabolic lymph node versus intramuscular metastasis involving the medial left pectoralis musculature Pathologic T11 fracture     12/11/2017 Procedure   Biopsy of T11 vertebral body lytic lesion: Metastatic breast adenocarcinoma ER positive, PR negative   12/19/2017 - 12/26/2017 Radiation Therapy   Palliative XRT to T 11   01/12/2018 - 10/26/2018 Anti-estrogen oral therapy   Patient could not tolerate Faslodex because of severe hypotension and severe pain in the legs after injections.  Ibrance with letrozole starting 01/12/2018   10/27/2018 -  Chemotherapy   Palliative chemotherapy with Halaven   05/03/2019 -  Anti-estrogen oral therapy   Alpelisib with Faslodex   Malignant neoplasm of lower-outer quadrant of left breast of female, estrogen receptor positive ([place])  10/21/2018 Initial Diagnosis   Malignant neoplasm of lower-outer quadrant of left breast of female, estrogen receptor positive ([place])   10/27/2018 - 05/02/2019 Chemotherapy   The patient had eriBULin mesylate (HALAVEN) 1.65 mg in sodium chloride 0.9 % 100 mL chemo infusion, 1.1 mg/m2 = 2.1 mg, Intravenous,  Once, 9 of 10 cycles Dose modification: 1.1 mg/m2 (original dose 1.4 mg/m2, Cycle 1, Reason: Other (see comments), Comment: CrCL < 50) Administration: 1.65 mg (10/27/2018), 1.65 mg (11/03/2018), 1.65 mg (11/17/2018), 1.65 mg (11/24/2018), 1.65 mg (12/08/2018), 1.65 mg (12/15/2018), 1.65 mg (12/29/2018), 1.65 mg (01/18/2019), 1.65 mg (02/08/2019), 1.65 mg (03/01/2019), 1.65 mg (03/22/2019), 1.65 mg (04/12/2019)  for chemotherapy treatment.    11/17/2018 - 12/03/2018 Radiation Therapy  Palliative radiation to the T5 location of the spine and right femur, 35 Gy in 14 fractions   04/05/2019 Miscellaneous   Guardant 360:PIK3CA mutation (alpelisib), NF1 S2 467 (everolimus) ARID1A S1791 (response to neratinib, olaparib, rucaparib, talazoparib), T p53 and SMAD4 Q534, TMB 35.41 mutations /MB, no evidence of  MSI high   08/30/2019 -  Chemotherapy   The patient had pembrolizumab (KEYTRUDA) 200 mg in sodium chloride 0.9 % 50 mL chemo infusion, 200 mg, Intravenous, Once, 0 of 6 cycles  for chemotherapy treatment.      CHIEF COMPLIANT: Follow-up of metastatic breast cancer to review scans   INTERVAL HISTORY: Elizabeth [name] is a 64 y.o. with above-mentioned history of metastatic breast cancerwhohas had recurrent pleural effusions.Sheis currently on treatment with AlpelisibandFaslodex.CT CAP on 08/19/19 showed a new lesion in the right liver lobe suspicious for metastatic disease, new moderate right pleural effusion, stable left pleural effusion, stable lymph nodes, and stable osseous metastases. She presents to the clinic todayto review her scans.  She is complaining of severe shortness of breath even at rest.  She is using oxygen all the time.  She does not have on portable oxygen so her activities are very limited.  The CT scan showed progressive pleural effusion on the right side.  Stable left pleural effusion.  She is also feeling very tired at rest.  She has lost some weight but lately she has stabilized.  ALLERGIES:  is allergic to shellfish allergy and sulfa antibiotics.  MEDICATIONS:  Current Outpatient Medications  Medication Sig Dispense Refill  . alpelisib (PIQRAY 300MG DAILY DOSE) 2 x 150 MG Therapy Pack Take two 139m tablets with food at the same time daily. Swallow whole, do not crush, chew, or split. 60 each 3  . Biotin 10000 MCG TABS Take 10,000 mcg by mouth daily.     . Cholecalciferol (VITAMIN D PO) Take 5,000 Units by mouth daily.     .Marland Kitchendexamethasone (DECADRON) 1 MG tablet Take 1 tablet (1 mg total) by mouth daily. 30 tablet 3  . DULoxetine (CYMBALTA) 30 MG capsule TAKE 1 CAPSULE (30 MG TOTAL) BY MOUTH 2 (TWO) TIMES DAILY. (Patient not taking: Reported on 06/23/2019) 180 capsule 2  . fentaNYL (DURAGESIC) 25 MCG/HR Place 1 patch onto the skin every 3 (three) days. 10 patch 0  .  gabapentin (NEURONTIN) 300 MG capsule Take 1 capsule (300 mg total) by mouth 2 (two) times daily. 180 capsule 0  . KLOR-CON M20 20 MEQ tablet TAKE 1 TABLET BY MOUTH EVERY DAY (Patient not taking: Reported on 06/23/2019) 90 tablet 0  . letrozole (FEMARA) 2.5 MG tablet Take 1 tablet (2.5 mg total) by mouth daily. 90 tablet 3  . levothyroxine (SYNTHROID, LEVOTHROID) 25 MCG tablet Take 25 mcg by mouth daily before breakfast.     . lidocaine-prilocaine (EMLA) cream Apply to affected area once 30 g 3  . loperamide (IMODIUM) 2 MG capsule 1 to 2 PO QID prn diarrhea 30 capsule 22  . LORazepam (ATIVAN) 0.5 MG tablet Patient to take 1 tablet, 0.567m1 hour prior to scan, may repeat X 1 if needed. 2 tablet 0  . LORazepam (ATIVAN) 1 MG tablet SMARTSIG:1 Tablet(s) By Mouth As Needed    . magic mouthwash w/lidocaine SOLN Take 5 mLs by mouth 3 (three) times daily as needed for mouth pain. 100 mL 0  . Multiple Vitamin (MULTIVITAMIN) tablet Take 1 tablet by mouth daily.    . mupirocin ointment (BACTROBAN) 2 % Apply to skin  lesion QID until better 30 g 0  . omeprazole (PRILOSEC) 20 MG capsule Take 1 capsule (20 mg total) by mouth daily. 90 capsule 0  . ondansetron (ZOFRAN) 8 MG tablet Take 1 tablet (8 mg total) by mouth every 8 (eight) hours as needed for nausea. 30 tablet 3  . oxyCODONE-acetaminophen (PERCOCET) 10-325 MG tablet Take 1 tablet by mouth every 8 (eight) hours as needed for pain. 90 tablet 0  . prednisoLONE acetate (PRED FORTE) 1 % ophthalmic suspension Place 1 drop into both eyes daily.    . prochlorperazine (COMPAZINE) 10 MG tablet Take 1 tablet (10 mg total) by mouth every 6 (six) hours as needed (Nausea or vomiting). 30 tablet 1  . prochlorperazine (COMPAZINE) 5 MG tablet Take 1 tablet (5 mg total) by mouth every 6 (six) hours as needed for nausea or vomiting. (Patient not taking: Reported on 06/23/2019) 45 tablet 2  . simethicone (GAS-X) 80 MG chewable tablet Chew 1 tablet (80 mg total) by mouth every 6  (six) hours as needed for flatulence. 120 tablet 2  . traMADol (ULTRAM) 50 MG tablet Take 1 tablet (50 mg total) by mouth every 6 (six) hours as needed. 120 tablet 0   No current facility-administered medications for this visit.    PHYSICAL EXAMINATION: ECOG PERFORMANCE STATUS: 3 - Symptomatic, >50% confined to bed  Vitals:   08/23/19 0926  BP: 119/68  Pulse: 97  Resp: 18  Temp: 98.5 F (36.9 C)  SpO2: 95%   Filed Weights   08/23/19 0926  Weight: 96 lb 9.6 oz (43.8 kg)    LABORATORY DATA:  I have reviewed the data as listed CMP Latest Ref Rng & Units 08/23/2019 07/26/2019 06/28/2019  Glucose 70 - 99 mg/dL 135(H) 129(H) 114(H)  BUN 8 - 23 mg/dL '12 14 13  ' Creatinine 0.44 - 1.00 mg/dL 1.31(H) 1.44(H) 0.99  Sodium 135 - 145 mmol/L 143 142 144  Potassium 3.5 - 5.1 mmol/L 3.4(L) 3.7 3.6  Chloride 98 - 111 mmol/L 102 106 103  CO2 22 - 32 mmol/L '30 27 30  ' Calcium 8.9 - 10.3 mg/dL 8.5(L) 8.8(L) 8.9  Total Protein 6.5 - 8.1 g/dL 6.3(L) 6.7 6.5  Total Bilirubin 0.3 - 1.2 mg/dL 0.4 0.4 0.4  Alkaline Phos 38 - 126 U/L 103 107 110  AST 15 - 41 U/L '28 25 26  ' ALT 0 - 44 U/L '15 18 20    ' Lab Results  Component Value Date   WBC 5.2 08/23/2019   HGB 11.4 (L) 08/23/2019   HCT 35.2 (L) 08/23/2019   MCV 95.7 08/23/2019   PLT 488 (H) 08/23/2019   NEUTROABS 3.2 08/23/2019    ASSESSMENT & PLAN:  Breast cancer of lower-outer quadrant of left female breast Left breast cancer invasive ductal carcinoma 2.5 cm in size grade 1, ER 99%, PR 41%, Ki-67 11%, HER-2 negative T2 N0 M0 stage II a status post neoadjuvant chemotherapy followed by surgery February 2011and wason antiestrogen therapy with Femara from January 2011-July 2017  Treatment : 1. Palliative radiation to T11 vertebral body and the pectoralis muscle: 12/20/17-12/26/17 2. Ibrance with letrozole along with Delton See or Zometadiscontinued July 2020 due to progression Patient could not tolerate Faslodex because of intense bone pain as  well as hypotension. 3.Halaven started 10/27/2018,completed 9 cycles as of 04/12/2019 discontinued for progression of disease.  04/05/2019:Guardant 360:PIK3CA mutation (alpelisib), NF1 S2 467 (everolimus) ARID1A S1791 (response to neratinib, olaparib, rucaparib, talazoparib), T p53 and SMAD4 Q534, TMB 35.41 Based on these results,  it appears that she could respond to alpelisib as well as everolimus and olaparib as well as possibly immunotherapy. ----------------------------------------------------------------------------------------------------------------------------------------- Current treatment: Alpelisib with Faslodex (250 mg because of prior intolerance) started 05/03/19 Alpelisib toxicities: 1.Severe diarrhea:Currently on Imodium andprobiotics.When she takes 2 Imodium's every morning the diarrhea appears to be better 2.Nausea: Much better with ondansetron. 3.Decreased appetite:Ondexamethasone.Appetite has improved energy levels are still low 4.    Dysphagia: We will refer her to gastroenterology. 5.  Shortness of breath: Due to worsening right pleural effusion.  I discussed with her about thoracentesis.  She would like to wait until next visit to make the decision.  We will request for [employer] oxygen concentrator so that she can be more mobile.  She refused thoracentesis at this time.  6. Difficulty with swallowing: Related to prior radiation to the chest  CT CAP 08/19/2019: New low-attenuation lesion right lobe of liver suspicious for metastatic disease.  Moderate right pleural effusion.  Similar left pleural effusion with pleural thickening.  Interval decrease in the enhancing lymph nodes in the mediastinum and left axilla.  Similar size left retroperitoneal mass involving celiac artery, SMA and left adrenal gland.  Unchanged bone metastases.  Radiology review: I discussed with the patient that she appears to have a mixed response to alpelisib.  We discussed different options  including switching her to everolimus and exemestane versus immunotherapy versus talazoparib versus continuation of the current treatment for couple more months and reassessing response.  Treatment plan: Switch treatment to pembrolizumab immunotherapy. (TMB 35) Plan to start treatment next week.  Orders Placed This Encounter  Procedures  . For home use only DME oxygen    Order Specific Question:   Length of Need    Answer:   12 Months    Order Specific Question:   Mode or (Route)    Answer:   Nasal cannula    Order Specific Question:   Liters per Minute    Answer:   2    Order Specific Question:   Frequency    Answer:   Continuous (stationary and portable oxygen unit needed)    Order Specific Question:   Oxygen conserving device    Answer:   Yes    Order Specific Question:   Oxygen delivery system    Answer:   Gas  . CBC with Differential (Cancer Center Only)    Standing Status:   Standing    Number of Occurrences:   20    Standing Expiration Date:   08/22/2020  . CMP ([facility] only)    Standing Status:   Standing    Number of Occurrences:   20    Standing Expiration Date:   08/22/2020  . T4    Standing Status:   Standing    Number of Occurrences:   20    Standing Expiration Date:   08/22/2020  . TSH    Standing Status:   Standing    Number of Occurrences:   20    Standing Expiration Date:   08/22/2020  . PHYSICIAN COMMUNICATION ORDER    Required labs: Thyroid function tests at baseline and every 3rd cycle.  Marland Kitchen PHYSICIAN COMMUNICATION ORDER    Treat until Progression, Unacceptable Toxicity, or up to 24 Months.   The patient has a good understanding of the overall plan. she agrees with it. she will call with any problems that may develop before the next visit here.  Total time spent: 30 mins including face to face time and time spent  for planning, charting and coordination of care  Nicholas Lose, MD 08/23/2019  I, Cloyde Reams Dorshimer, am acting as scribe for Dr. Nicholas Lose.  I have reviewed the above documentation for accuracy and completeness, and I agree with the above.

## 2019-08-23 ENCOUNTER — Other Ambulatory Visit: Payer: Self-pay

## 2019-08-23 ENCOUNTER — Inpatient Hospital Stay (HOSPITAL_BASED_OUTPATIENT_CLINIC_OR_DEPARTMENT_OTHER): Payer: Medicare Other | Admitting: Hematology and Oncology

## 2019-08-23 ENCOUNTER — Inpatient Hospital Stay: Payer: Medicare Other

## 2019-08-23 ENCOUNTER — Inpatient Hospital Stay: Payer: Medicare Other | Attending: Hematology and Oncology

## 2019-08-23 VITALS — BP 119/68 | HR 97 | Temp 98.5°F | Resp 18 | Ht 63.0 in | Wt 96.6 lb

## 2019-08-23 DIAGNOSIS — C7951 Secondary malignant neoplasm of bone: Secondary | ICD-10-CM | POA: Insufficient documentation

## 2019-08-23 DIAGNOSIS — Z9221 Personal history of antineoplastic chemotherapy: Secondary | ICD-10-CM | POA: Diagnosis not present

## 2019-08-23 DIAGNOSIS — Z170 Estrogen receptor positive status [ER+]: Secondary | ICD-10-CM

## 2019-08-23 DIAGNOSIS — Z7189 Other specified counseling: Secondary | ICD-10-CM | POA: Diagnosis not present

## 2019-08-23 DIAGNOSIS — Z79899 Other long term (current) drug therapy: Secondary | ICD-10-CM | POA: Diagnosis not present

## 2019-08-23 DIAGNOSIS — R1310 Dysphagia, unspecified: Secondary | ICD-10-CM

## 2019-08-23 DIAGNOSIS — J90 Pleural effusion, not elsewhere classified: Secondary | ICD-10-CM | POA: Insufficient documentation

## 2019-08-23 DIAGNOSIS — C50512 Malignant neoplasm of lower-outer quadrant of left female breast: Secondary | ICD-10-CM | POA: Diagnosis present

## 2019-08-23 DIAGNOSIS — Z79811 Long term (current) use of aromatase inhibitors: Secondary | ICD-10-CM | POA: Insufficient documentation

## 2019-08-23 DIAGNOSIS — Z95828 Presence of other vascular implants and grafts: Secondary | ICD-10-CM

## 2019-08-23 DIAGNOSIS — Z923 Personal history of irradiation: Secondary | ICD-10-CM | POA: Diagnosis not present

## 2019-08-23 DIAGNOSIS — R1319 Other dysphagia: Secondary | ICD-10-CM

## 2019-08-23 LAB — CBC WITH DIFFERENTIAL (CANCER CENTER ONLY)
Abs Immature Granulocytes: 0.02 10*3/uL (ref 0.00–0.07)
Basophils Absolute: 0.1 10*3/uL (ref 0.0–0.1)
Basophils Relative: 1 %
Eosinophils Absolute: 0.3 10*3/uL (ref 0.0–0.5)
Eosinophils Relative: 6 %
HCT: 35.2 % — ABNORMAL LOW (ref 36.0–46.0)
Hemoglobin: 11.4 g/dL — ABNORMAL LOW (ref 12.0–15.0)
Immature Granulocytes: 0 %
Lymphocytes Relative: 18 %
Lymphs Abs: 0.9 10*3/uL (ref 0.7–4.0)
MCH: 31 pg (ref 26.0–34.0)
MCHC: 32.4 g/dL (ref 30.0–36.0)
MCV: 95.7 fL (ref 80.0–100.0)
Monocytes Absolute: 0.7 10*3/uL (ref 0.1–1.0)
Monocytes Relative: 13 %
Neutro Abs: 3.2 10*3/uL (ref 1.7–7.7)
Neutrophils Relative %: 62 %
Platelet Count: 488 10*3/uL — ABNORMAL HIGH (ref 150–400)
RBC: 3.68 MIL/uL — ABNORMAL LOW (ref 3.87–5.11)
RDW: 16.1 % — ABNORMAL HIGH (ref 11.5–15.5)
WBC Count: 5.2 10*3/uL (ref 4.0–10.5)
nRBC: 0 % (ref 0.0–0.2)

## 2019-08-23 LAB — CMP (CANCER CENTER ONLY)
ALT: 15 U/L (ref 0–44)
AST: 28 U/L (ref 15–41)
Albumin: 3 g/dL — ABNORMAL LOW (ref 3.5–5.0)
Alkaline Phosphatase: 103 U/L (ref 38–126)
Anion gap: 11 (ref 5–15)
BUN: 12 mg/dL (ref 8–23)
CO2: 30 mmol/L (ref 22–32)
Calcium: 8.5 mg/dL — ABNORMAL LOW (ref 8.9–10.3)
Chloride: 102 mmol/L (ref 98–111)
Creatinine: 1.31 mg/dL — ABNORMAL HIGH (ref 0.44–1.00)
GFR, Est AFR Am: 49 mL/min — ABNORMAL LOW (ref >60)
GFR, Estimated: 43 mL/min — ABNORMAL LOW (ref >60)
Glucose, Bld: 135 mg/dL — ABNORMAL HIGH (ref 70–99)
Potassium: 3.4 mmol/L — ABNORMAL LOW (ref 3.5–5.1)
Sodium: 143 mmol/L (ref 135–145)
Total Bilirubin: 0.4 mg/dL (ref 0.3–1.2)
Total Protein: 6.3 g/dL — ABNORMAL LOW (ref 6.5–8.1)

## 2019-08-23 MED ORDER — SODIUM CHLORIDE 0.9% FLUSH
10.0000 mL | INTRAVENOUS | Status: DC | PRN
  Administered 2019-08-23: 10 mL via INTRAVENOUS
  Filled 2019-08-23: qty 10

## 2019-08-23 MED ORDER — PROCHLORPERAZINE MALEATE 10 MG PO TABS: 10.0000 mg | ORAL_TABLET | Freq: Four times a day (QID) | ORAL | 1 refills | Status: DC | PRN

## 2019-08-23 MED ORDER — LIDOCAINE-PRILOCAINE 2.5-2.5 % EX CREA: TOPICAL_CREAM | CUTANEOUS | 3 refills | Status: DC

## 2019-08-23 NOTE — Assessment & Plan Note (Signed)
Left breast cancer invasive ductal carcinoma 2.5 cm in size grade 1, ER 99%, PR 41%, Ki-67 11%, HER-2 negative T2 N0 M0 stage II a status post neoadjuvant chemotherapy followed by surgery February 2011and wason antiestrogen therapy with Femara from January 2011-July 2017  Treatment : 1. Palliative radiation to T11 vertebral body and the pectoralis muscle: 12/20/17-12/26/17 2. Ibrance with letrozole along with Delton See or Zometadiscontinued July 2020 due to progression Patient could not tolerate Faslodex because of intense bone pain as well as hypotension. 3.Halaven started 10/27/2018,completed 9 cycles as of 04/12/2019 discontinued for progression of disease.  04/05/2019:Guardant 360:PIK3CA mutation (alpelisib), NF1 S2 467 (everolimus) ARID1A S1791 (response to neratinib, olaparib, rucaparib, talazoparib), T p53 and SMAD4 Q534, TMB 35.41 Based on these results, it appears that she could respond to alpelisib as well as everolimus and olaparib as well as possibly immunotherapy. ----------------------------------------------------------------------------------------------------------------------------------------- Current treatment: Alpelisib with Faslodex (250 mg because of prior intolerance) started 05/03/19 Alpelisib toxicities: 1.Severe diarrhea:Currently on Imodium andprobiotics.When she takes 2 Imodium's every morning the diarrhea appears to be better 2.Nausea: Much better with ondansetron. 3.Decreased appetite:Ondexamethasone.Appetite has improved energy levels are still low 4.  Patient tells me that her blood sugars are under good control.  Difficulty with swallowing: Related to prior radiation to the chest  CT CAP 08/19/2019: New low-attenuation lesion right lobe of liver suspicious for metastatic disease.  Moderate right pleural effusion.  Similar left pleural effusion with pleural thickening.  Interval decrease in the enhancing lymph nodes in the mediastinum and left  axilla.  Similar size left retroperitoneal mass involving celiac artery, SMA and left adrenal gland.  Unchanged bone metastases.  Radiology review: I discussed with the patient that she appears to have a mixed response to alpelisib.  We discussed different options including switching her to everolimus and exemestane versus immunotherapy versus talazoparib versus continuation of the current treatment for couple more months and reassessing response.

## 2019-08-23 NOTE — Progress Notes (Signed)
DISCONTINUE ON PATHWAY REGIMEN - Breast     A cycle is every 21 days:     Eribulin mesylate   **Always confirm dose/schedule in your pharmacy ordering system**  REASON: Disease Progression PRIOR TREATMENT: [id number]: Eribulin 1.4 mg/m2 D1, 8 q21 Days TREATMENT RESPONSE: Unable to Evaluate  START OFF PATHWAY REGIMEN - Breast   [id number]:Pembrolizumab 200 mg q21 Days:   A cycle is 21 days:     Pembrolizumab   **Always confirm dose/schedule in your pharmacy ordering system**  Patient Characteristics: Distant Metastases or Locoregional Recurrent Disease - Unresected or Locally Advanced Unresectable Disease Progressing after Neoadjuvant and Local Therapies, HER2 Negative/Unknown/Equivocal, ER Positive, Chemotherapy, Third Line and Beyond, Prior or  Contraindicated Anthracycline and Prior Eribulin Therapeutic Status: Distant Metastases BRCA Mutation Status: Absent ER Status: Positive (+) HER2 Status: Negative (-) PR Status: Positive (+) Line of Therapy: Third Line and Beyond Intent of Therapy: Non-Curative / Palliative Intent, Discussed with Patient

## 2019-08-26 ENCOUNTER — Other Ambulatory Visit: Payer: Self-pay | Admitting: *Deleted

## 2019-08-26 DIAGNOSIS — J90 Pleural effusion, not elsewhere classified: Secondary | ICD-10-CM

## 2019-08-26 DIAGNOSIS — R0602 Shortness of breath: Secondary | ICD-10-CM

## 2019-08-26 DIAGNOSIS — C50512 Malignant neoplasm of lower-outer quadrant of left female breast: Secondary | ICD-10-CM

## 2019-08-26 NOTE — Progress Notes (Signed)
Received call from pt stating she is experiencing worsening shortness of breath and would like to proceed with thoracentesis.  Per MD, RN to place orders for thoracentesis and schedule pt as soon as possible.  Orders placed and scheduling notified to contact pt of apt date and time.

## 2019-08-27 ENCOUNTER — Other Ambulatory Visit: Payer: Self-pay | Admitting: *Deleted

## 2019-08-27 ENCOUNTER — Telehealth: Payer: Self-pay | Admitting: Hematology and Oncology

## 2019-08-27 DIAGNOSIS — R0602 Shortness of breath: Secondary | ICD-10-CM

## 2019-08-27 DIAGNOSIS — J90 Pleural effusion, not elsewhere classified: Secondary | ICD-10-CM

## 2019-08-27 DIAGNOSIS — C50512 Malignant neoplasm of lower-outer quadrant of left female breast: Secondary | ICD-10-CM

## 2019-08-27 NOTE — Telephone Encounter (Signed)
Scheduled appt per 5/13 sch message - pt aware of appt date and time   

## 2019-08-29 ENCOUNTER — Emergency Department (HOSPITAL_COMMUNITY): Payer: Medicare Other

## 2019-08-29 ENCOUNTER — Encounter (HOSPITAL_COMMUNITY): Payer: Self-pay

## 2019-08-29 ENCOUNTER — Other Ambulatory Visit: Payer: Self-pay

## 2019-08-29 ENCOUNTER — Inpatient Hospital Stay (HOSPITAL_COMMUNITY)
Admission: EM | Admit: 2019-08-29 | Discharge: 2019-09-15 | DRG: 054 | Disposition: A | Discharge status: Skilled Nursing Facility | Payer: Medicare Other | Attending: Family Medicine | Admitting: Family Medicine

## 2019-08-29 DIAGNOSIS — E7800 Pure hypercholesterolemia, unspecified: Secondary | ICD-10-CM | POA: Diagnosis present

## 2019-08-29 DIAGNOSIS — G936 Cerebral edema: Secondary | ICD-10-CM | POA: Diagnosis present

## 2019-08-29 DIAGNOSIS — R7303 Prediabetes: Secondary | ICD-10-CM | POA: Diagnosis present

## 2019-08-29 DIAGNOSIS — Z8249 Family history of ischemic heart disease and other diseases of the circulatory system: Secondary | ICD-10-CM

## 2019-08-29 DIAGNOSIS — Z681 Body mass index (BMI) 19 or less, adult: Secondary | ICD-10-CM | POA: Diagnosis not present

## 2019-08-29 DIAGNOSIS — Z9013 Acquired absence of bilateral breasts and nipples: Secondary | ICD-10-CM | POA: Diagnosis not present

## 2019-08-29 DIAGNOSIS — C50919 Malignant neoplasm of unspecified site of unspecified female breast: Secondary | ICD-10-CM | POA: Diagnosis not present

## 2019-08-29 DIAGNOSIS — I890 Lymphedema, not elsewhere classified: Secondary | ICD-10-CM | POA: Diagnosis not present

## 2019-08-29 DIAGNOSIS — C7951 Secondary malignant neoplasm of bone: Secondary | ICD-10-CM | POA: Diagnosis present

## 2019-08-29 DIAGNOSIS — E876 Hypokalemia: Secondary | ICD-10-CM | POA: Diagnosis present

## 2019-08-29 DIAGNOSIS — C7931 Secondary malignant neoplasm of brain: Secondary | ICD-10-CM | POA: Diagnosis present

## 2019-08-29 DIAGNOSIS — Z66 Do not resuscitate: Secondary | ICD-10-CM | POA: Diagnosis not present

## 2019-08-29 DIAGNOSIS — J90 Pleural effusion, not elsewhere classified: Secondary | ICD-10-CM

## 2019-08-29 DIAGNOSIS — Z9889 Other specified postprocedural states: Secondary | ICD-10-CM | POA: Diagnosis not present

## 2019-08-29 DIAGNOSIS — J9611 Chronic respiratory failure with hypoxia: Secondary | ICD-10-CM | POA: Diagnosis present

## 2019-08-29 DIAGNOSIS — E039 Hypothyroidism, unspecified: Secondary | ICD-10-CM | POA: Diagnosis present

## 2019-08-29 DIAGNOSIS — G893 Neoplasm related pain (acute) (chronic): Secondary | ICD-10-CM | POA: Diagnosis not present

## 2019-08-29 DIAGNOSIS — Z170 Estrogen receptor positive status [ER+]: Secondary | ICD-10-CM

## 2019-08-29 DIAGNOSIS — Z91013 Allergy to seafood: Secondary | ICD-10-CM

## 2019-08-29 DIAGNOSIS — R0602 Shortness of breath: Secondary | ICD-10-CM

## 2019-08-29 DIAGNOSIS — E871 Hypo-osmolality and hyponatremia: Secondary | ICD-10-CM | POA: Diagnosis not present

## 2019-08-29 DIAGNOSIS — R739 Hyperglycemia, unspecified: Secondary | ICD-10-CM | POA: Diagnosis not present

## 2019-08-29 DIAGNOSIS — D473 Essential (hemorrhagic) thrombocythemia: Secondary | ICD-10-CM | POA: Diagnosis not present

## 2019-08-29 DIAGNOSIS — Z515 Encounter for palliative care: Secondary | ICD-10-CM

## 2019-08-29 DIAGNOSIS — Z79890 Hormone replacement therapy (postmenopausal): Secondary | ICD-10-CM | POA: Diagnosis not present

## 2019-08-29 DIAGNOSIS — C799 Secondary malignant neoplasm of unspecified site: Secondary | ICD-10-CM

## 2019-08-29 DIAGNOSIS — Y92239 Unspecified place in hospital as the place of occurrence of the external cause: Secondary | ICD-10-CM | POA: Diagnosis not present

## 2019-08-29 DIAGNOSIS — Z7189 Other specified counseling: Secondary | ICD-10-CM | POA: Diagnosis not present

## 2019-08-29 DIAGNOSIS — R030 Elevated blood-pressure reading, without diagnosis of hypertension: Secondary | ICD-10-CM | POA: Diagnosis not present

## 2019-08-29 DIAGNOSIS — Z79891 Long term (current) use of opiate analgesic: Secondary | ICD-10-CM

## 2019-08-29 DIAGNOSIS — E878 Other disorders of electrolyte and fluid balance, not elsewhere classified: Secondary | ICD-10-CM | POA: Diagnosis not present

## 2019-08-29 DIAGNOSIS — Z79899 Other long term (current) drug therapy: Secondary | ICD-10-CM

## 2019-08-29 DIAGNOSIS — R7989 Other specified abnormal findings of blood chemistry: Secondary | ICD-10-CM | POA: Diagnosis not present

## 2019-08-29 DIAGNOSIS — Z20822 Contact with and (suspected) exposure to covid-19: Secondary | ICD-10-CM | POA: Diagnosis present

## 2019-08-29 DIAGNOSIS — R296 Repeated falls: Secondary | ICD-10-CM | POA: Diagnosis present

## 2019-08-29 DIAGNOSIS — R531 Weakness: Secondary | ICD-10-CM | POA: Diagnosis not present

## 2019-08-29 DIAGNOSIS — Z79811 Long term (current) use of aromatase inhibitors: Secondary | ICD-10-CM

## 2019-08-29 DIAGNOSIS — K5900 Constipation, unspecified: Secondary | ICD-10-CM

## 2019-08-29 DIAGNOSIS — D649 Anemia, unspecified: Secondary | ICD-10-CM | POA: Diagnosis not present

## 2019-08-29 DIAGNOSIS — R300 Dysuria: Secondary | ICD-10-CM | POA: Diagnosis not present

## 2019-08-29 DIAGNOSIS — D72829 Elevated white blood cell count, unspecified: Secondary | ICD-10-CM | POA: Diagnosis not present

## 2019-08-29 DIAGNOSIS — R110 Nausea: Secondary | ICD-10-CM | POA: Diagnosis not present

## 2019-08-29 DIAGNOSIS — F40240 Claustrophobia: Secondary | ICD-10-CM | POA: Diagnosis present

## 2019-08-29 DIAGNOSIS — Z882 Allergy status to sulfonamides status: Secondary | ICD-10-CM

## 2019-08-29 DIAGNOSIS — T380X5A Adverse effect of glucocorticoids and synthetic analogues, initial encounter: Secondary | ICD-10-CM | POA: Diagnosis not present

## 2019-08-29 DIAGNOSIS — C50512 Malignant neoplasm of lower-outer quadrant of left female breast: Secondary | ICD-10-CM

## 2019-08-29 DIAGNOSIS — Z96642 Presence of left artificial hip joint: Secondary | ICD-10-CM | POA: Diagnosis present

## 2019-08-29 DIAGNOSIS — Z9221 Personal history of antineoplastic chemotherapy: Secondary | ICD-10-CM

## 2019-08-29 DIAGNOSIS — R64 Cachexia: Secondary | ICD-10-CM | POA: Diagnosis present

## 2019-08-29 DIAGNOSIS — R7401 Elevation of levels of liver transaminase levels: Secondary | ICD-10-CM | POA: Diagnosis not present

## 2019-08-29 DIAGNOSIS — D630 Anemia in neoplastic disease: Secondary | ICD-10-CM | POA: Diagnosis present

## 2019-08-29 HISTORY — DX: Dizziness and giddiness: R42

## 2019-08-29 LAB — CBC WITH DIFFERENTIAL/PLATELET
Abs Immature Granulocytes: 0.04 10*3/uL (ref 0.00–0.07)
Basophils Absolute: 0.1 10*3/uL (ref 0.0–0.1)
Basophils Relative: 1 %
Eosinophils Absolute: 0.1 10*3/uL (ref 0.0–0.5)
Eosinophils Relative: 1 %
HCT: 34.7 % — ABNORMAL LOW (ref 36.0–46.0)
Hemoglobin: 11.3 g/dL — ABNORMAL LOW (ref 12.0–15.0)
Immature Granulocytes: 1 %
Lymphocytes Relative: 12 %
Lymphs Abs: 0.8 10*3/uL (ref 0.7–4.0)
MCH: 31.5 pg (ref 26.0–34.0)
MCHC: 32.6 g/dL (ref 30.0–36.0)
MCV: 96.7 fL (ref 80.0–100.0)
Monocytes Absolute: 0.8 10*3/uL (ref 0.1–1.0)
Monocytes Relative: 13 %
Neutro Abs: 4.5 10*3/uL (ref 1.7–7.7)
Neutrophils Relative %: 72 %
Platelets: 518 10*3/uL — ABNORMAL HIGH (ref 150–400)
RBC: 3.59 MIL/uL — ABNORMAL LOW (ref 3.87–5.11)
RDW: 15.1 % (ref 11.5–15.5)
WBC: 6.2 10*3/uL (ref 4.0–10.5)
nRBC: 0 % (ref 0.0–0.2)

## 2019-08-29 LAB — COMPREHENSIVE METABOLIC PANEL
ALT: 19 U/L (ref 0–44)
AST: 44 U/L — ABNORMAL HIGH (ref 15–41)
Albumin: 3.3 g/dL — ABNORMAL LOW (ref 3.5–5.0)
Alkaline Phosphatase: 97 U/L (ref 38–126)
Anion gap: 11 (ref 5–15)
BUN: 9 mg/dL (ref 8–23)
CO2: 30 mmol/L (ref 22–32)
Calcium: 9.1 mg/dL (ref 8.9–10.3)
Chloride: 99 mmol/L (ref 98–111)
Creatinine, Ser: 0.84 mg/dL (ref 0.44–1.00)
GFR calc Af Amer: >60 mL/min (ref >60)
GFR calc non Af Amer: >60 mL/min (ref >60)
Glucose, Bld: 118 mg/dL — ABNORMAL HIGH (ref 70–99)
Potassium: 3.1 mmol/L — ABNORMAL LOW (ref 3.5–5.1)
Sodium: 140 mmol/L (ref 135–145)
Total Bilirubin: 0.7 mg/dL (ref 0.3–1.2)
Total Protein: 6.7 g/dL (ref 6.5–8.1)

## 2019-08-29 LAB — TROPONIN I (HIGH SENSITIVITY): Troponin I (High Sensitivity): 7 ng/L (ref <18)

## 2019-08-29 MED ORDER — DEXAMETHASONE SODIUM PHOSPHATE 10 MG/ML IJ SOLN: 8.0000 mg | Freq: Once | INTRAMUSCULAR | Status: DC

## 2019-08-29 MED ORDER — LETROZOLE 2.5 MG PO TABS: 2.5000 mg | ORAL_TABLET | Freq: Every day | ORAL | Status: DC

## 2019-08-29 MED ORDER — DIPHENHYDRAMINE HCL 50 MG/ML IJ SOLN
25.0000 mg | Freq: Once | INTRAMUSCULAR | Status: AC
  Administered 2019-08-29: 25 mg via INTRAVENOUS
  Filled 2019-08-29: qty 1

## 2019-08-29 MED ORDER — LEVOTHYROXINE SODIUM 25 MCG PO TABS
25.0000 ug | ORAL_TABLET | Freq: Every day | ORAL | Status: DC
  Administered 2019-08-30 – 2019-09-15 (×16): 25 ug via ORAL
  Filled 2019-08-29 (×18): qty 1

## 2019-08-29 MED ORDER — GADOBUTROL 1 MMOL/ML IV SOLN
5.0000 mL | Freq: Once | INTRAVENOUS | Status: AC | PRN
  Administered 2019-08-29: 5 mL via INTRAVENOUS

## 2019-08-29 MED ORDER — ONDANSETRON HCL 4 MG/2ML IJ SOLN
4.0000 mg | Freq: Four times a day (QID) | INTRAMUSCULAR | Status: DC | PRN
  Administered 2019-09-01 – 2019-09-02 (×3): 4 mg via INTRAVENOUS
  Filled 2019-08-29 (×3): qty 2

## 2019-08-29 MED ORDER — BIOTIN 10000 MCG PO TABS: 10000.0000 ug | ORAL_TABLET | Freq: Every day | ORAL | Status: DC

## 2019-08-29 MED ORDER — LORAZEPAM 1 MG PO TABS
1.0000 mg | ORAL_TABLET | Freq: Every day | ORAL | Status: DC | PRN
  Administered 2019-08-30 – 2019-09-08 (×8): 1 mg via ORAL
  Filled 2019-08-29 (×8): qty 1

## 2019-08-29 MED ORDER — FENTANYL 25 MCG/HR TD PT72: 1.0000 | MEDICATED_PATCH | TRANSDERMAL | Status: DC

## 2019-08-29 MED ORDER — DEXAMETHASONE SODIUM PHOSPHATE 10 MG/ML IJ SOLN
10.0000 mg | Freq: Once | INTRAMUSCULAR | Status: AC
  Administered 2019-08-29: 10 mg via INTRAVENOUS
  Filled 2019-08-29: qty 1

## 2019-08-29 MED ORDER — PROCHLORPERAZINE EDISYLATE 10 MG/2ML IJ SOLN
10.0000 mg | Freq: Once | INTRAMUSCULAR | Status: AC
  Administered 2019-08-29: 10 mg via INTRAVENOUS
  Filled 2019-08-29: qty 2

## 2019-08-29 MED ORDER — SODIUM CHLORIDE 0.9 % IV BOLUS
1000.0000 mL | Freq: Once | INTRAVENOUS | Status: AC
  Administered 2019-08-29: 1000 mL via INTRAVENOUS

## 2019-08-29 MED ORDER — DEXAMETHASONE 4 MG PO TABS: 10.0000 mg | ORAL_TABLET | Freq: Once | ORAL | Status: DC

## 2019-08-29 MED ORDER — PROCHLORPERAZINE MALEATE 10 MG PO TABS: 10.0000 mg | ORAL_TABLET | Freq: Four times a day (QID) | ORAL | Status: DC | PRN

## 2019-08-29 MED ORDER — ONDANSETRON HCL 4 MG PO TABS
4.0000 mg | ORAL_TABLET | Freq: Four times a day (QID) | ORAL | Status: DC | PRN
  Administered 2019-09-02: 4 mg via ORAL
  Filled 2019-08-29: qty 1

## 2019-08-29 MED ORDER — TRAMADOL HCL 50 MG PO TABS
50.0000 mg | ORAL_TABLET | Freq: Four times a day (QID) | ORAL | Status: DC | PRN
  Administered 2019-09-01 – 2019-09-04 (×2): 50 mg via ORAL
  Filled 2019-08-29 (×2): qty 1

## 2019-08-29 MED ORDER — PREDNISOLONE ACETATE 1 % OP SUSP
1.0000 [drp] | Freq: Every day | OPHTHALMIC | Status: DC
  Administered 2019-08-30 – 2019-09-14 (×16): 1 [drp] via OPHTHALMIC
  Filled 2019-08-29 (×2): qty 5

## 2019-08-29 MED ORDER — DIAZEPAM 5 MG/ML IJ SOLN
5.0000 mg | Freq: Once | INTRAMUSCULAR | Status: AC | PRN
  Administered 2019-08-29: 5 mg via INTRAVENOUS
  Filled 2019-08-29: qty 2

## 2019-08-29 MED ORDER — GABAPENTIN 300 MG PO CAPS
300.0000 mg | ORAL_CAPSULE | Freq: Two times a day (BID) | ORAL | Status: DC
  Administered 2019-08-30 – 2019-09-14 (×32): 300 mg via ORAL
  Filled 2019-08-29 (×33): qty 1

## 2019-08-29 MED ORDER — DEXAMETHASONE SODIUM PHOSPHATE 10 MG/ML IJ SOLN
8.0000 mg | Freq: Three times a day (TID) | INTRAMUSCULAR | Status: AC
  Administered 2019-08-30 (×2): 8 mg via INTRAVENOUS
  Filled 2019-08-29 (×3): qty 1

## 2019-08-29 MED ORDER — OXYCODONE-ACETAMINOPHEN 10-325 MG PO TABS: 1.0000 | ORAL_TABLET | Freq: Three times a day (TID) | ORAL | Status: DC | PRN

## 2019-08-29 NOTE — H&P (Signed)
History and Physical    Elizabeth [name] [id number] DOB: 11-28-1955 DOA: 08/29/2019  PCP: [name], PA  Patient coming from: Home.  Chief Complaint: Dizziness.  HPI: Elizabeth [name] is a 64 y.o. female with history of metastatic breast cancer and hypothyroidism has been experiencing dizziness over the last 3 to 4 days with frequent falls denies losing consciousness.  Patient states she has more weakness on the right side and staggers towards the right side.  Denies any incontinence of urine or bowel.  Has been a global headache more on the right ear side.  Denies any visual symptoms or difficulty speaking.  ED Course: In the ER CT head shows metastatic lesions and MRI brain was done with and without contrast which shows multiple metastatic lesion mostly concentrated in the cerebellum with mass-effect.  On-call oncologist was consulted requested patient be placed on Decadron IV and they will be seeing patient in consult and probably will need radiation oncology consult.  On exam patient is able to move all extremities.  Labs show hemoglobin around baseline of 11.3 platelets 418 potassium was 3.1.  I sensitive troponin was 7 EKG shows normal sinus rhythm.  Covid test was negative.  Review of Systems: As per HPI, rest all negative.   Past Medical History:  Diagnosis Date  . Arthritis   . Breast cancer ([place]) 2010   Left  . Chronic sinusitis   . Family history of adverse reaction to anesthesia    sister-nausea/vomiting  . History of chemotherapy 01/2009-03/2009  . Hypercholesteremia   . Hypothyroid    "inactive thyroid"  . Metastasis ([place]) 11/2017   LN  . Metastatic cancer to bone ([place]) 11/2017  . Migraines   . RSD (reflex sympathetic dystrophy)   . Vertigo     Past Surgical History:  Procedure Laterality Date  . BREAST RECONSTRUCTION    . EYE SURGERY Bilateral 7/18, 10/18  . IR IMAGING GUIDED PORT INSERTION  10/28/2018  . IR INSTILL VIA CHEST TUBE AGENT FOR FIBRINOLYSIS INI  DAY  10/09/2018  . IR PATIENT EVAL TECH 0-60 MINS  09/08/2018  . IR PERC PLEURAL DRAIN W/INDWELL CATH W/IMG GUIDE  08/05/2018  . IR RADIOLOGIST EVAL & MGMT  10/12/2018  . IR REMOVAL OF PLURAL CATH W/CUFF  10/26/2018  . KNEE ARTHROSCOPY Bilateral   . KNEE SURGERY Right    Two surgeries  . MASTECTOMY Bilateral 05/22/2009  . MASTECTOMY Bilateral 2011   restrictions on left arm-no labs/bloodpressure  . NASAL SINUS SURGERY    . PORTA CATH INSERTION  12/2008  . PORTA CATH REMOVAL  05/2009  . ROOT CANAL    . SHOULDER SURGERY Right   . TOTAL HIP ARTHROPLASTY Left 05/26/2017   Procedure: TOTAL HIP ARTHROPLASTY ANTERIOR APPROACH;  Surgeon: Frederik Pear, MD;  Location: [place];  Service: Orthopedics;  Laterality: Left;     reports that she has never smoked. She has never used smokeless tobacco. She reports that she does not drink alcohol or use drugs.  Allergies  Allergen Reactions  . Shellfish Allergy Anaphylaxis and Other (See Comments)    Allergist said she was "highly allergic to shellfish"  . Sulfa Antibiotics Rash and Other (See Comments)    Looks like severe sunburn    Family History  Problem Relation Age of Onset  . Hypertension Mother     Prior to Admission medications   Medication Sig Start Date End Date Taking? Authorizing Provider  gabapentin (NEURONTIN) 300 MG capsule Take 1 capsule (300  mg total) by mouth 2 (two) times daily. 08/10/19  Yes Nicholas Lose, MD  levothyroxine (SYNTHROID, LEVOTHROID) 25 MCG tablet Take 25 mcg by mouth daily before breakfast.    Yes [provider]  prednisoLONE acetate (PRED FORTE) 1 % ophthalmic suspension Place 1 drop into both eyes daily.   Yes [provider]  alpelisib (PIQRAY 300MG  DAILY DOSE) 2 x 150 MG Therapy Pack Take two 150mg  tablets with food at the same time daily. Swallow whole, do not crush, chew, or split. 05/03/19   Nicholas Lose, MD  Biotin 10000 MCG TABS Take 10,000 mcg by mouth daily.     [provider]    Cholecalciferol (VITAMIN D PO) Take 5,000 Units by mouth daily.     [provider]  dexamethasone (DECADRON) 1 MG tablet Take 1 tablet (1 mg total) by mouth daily. 06/14/19   Nicholas Lose, MD  DULoxetine (CYMBALTA) 30 MG capsule TAKE 1 CAPSULE (30 MG TOTAL) BY MOUTH 2 (TWO) TIMES DAILY. Patient not taking: Reported on 06/23/2019 10/13/18   Nicholas Lose, MD  fentaNYL (DURAGESIC) 25 MCG/HR Place 1 patch onto the skin every 3 (three) days. 05/20/19   Nicholas Lose, MD  KLOR-CON M20 20 MEQ tablet TAKE 1 TABLET BY MOUTH EVERY DAY Patient not taking: Reported on 06/23/2019 05/11/19   Nicholas Lose, MD  letrozole [facility]) 2.5 MG tablet Take 1 tablet (2.5 mg total) by mouth daily. 05/03/19   Nicholas Lose, MD  lidocaine-prilocaine (EMLA) cream Apply to affected area once 08/23/19   Nicholas Lose, MD  loperamide (IMODIUM) 2 MG capsule 1 to 2 PO QID prn diarrhea 10/05/18   Tanner, Lyndon Code., PA-C  LORazepam (ATIVAN) 0.5 MG tablet Patient to take 1 tablet, 0.5mg  1 hour prior to scan, may repeat X 1 if needed. Patient not taking: Reported on 08/29/2019 08/10/19   Nicholas Lose, MD  LORazepam (ATIVAN) 1 MG tablet SMARTSIG:1 Tablet(s) By Mouth As Needed 02/17/19   [provider]  magic mouthwash w/lidocaine SOLN Take 5 mLs by mouth 3 (three) times daily as needed for mouth pain. 11/13/18   Nicholas Lose, MD  Multiple Vitamin (MULTIVITAMIN) tablet Take 1 tablet by mouth daily.    [provider]  mupirocin ointment (BACTROBAN) 2 % Apply to skin lesion QID until better 07/12/19   Harle Stanford., PA-C  omeprazole (PRILOSEC) 20 MG capsule Take 1 capsule (20 mg total) by mouth daily. 01/11/19   Nicholas Lose, MD  ondansetron (ZOFRAN) 8 MG tablet Take 1 tablet (8 mg total) by mouth every 8 (eight) hours as needed for nausea. 05/24/19   Nicholas Lose, MD  oxyCODONE-acetaminophen (PERCOCET) 10-325 MG tablet Take 1 tablet by mouth every 8 (eight) hours as needed for pain. 07/26/19   Nicholas Lose, MD   prochlorperazine (COMPAZINE) 10 MG tablet Take 1 tablet (10 mg total) by mouth every 6 (six) hours as needed (Nausea or vomiting). 08/23/19   Nicholas Lose, MD  prochlorperazine (COMPAZINE) 5 MG tablet Take 1 tablet (5 mg total) by mouth every 6 (six) hours as needed for nausea or vomiting. Patient not taking: Reported on 06/23/2019 10/05/18   Harle Stanford., PA-C  simethicone (GAS-X) 80 MG chewable tablet Chew 1 tablet (80 mg total) by mouth every 6 (six) hours as needed for flatulence. 10/05/18   Tanner, Lyndon Code., PA-C  traMADol (ULTRAM) 50 MG tablet Take 1 tablet (50 mg total) by mouth every 6 (six) hours as needed. 05/03/19   Nicholas Lose, MD  Physical Exam: Constitutional: Moderately built and nourished. Vitals:   08/29/19 1800 08/29/19 2000 08/29/19 2030 08/29/19 2105  BP: 131/83 (!) 145/98 (!) 140/103 127/88  Pulse:   (!) 103 (!) 108  Resp: 20  (!) 21 19  Temp:      TempSrc:      SpO2:   (!) 88% (!) 86%  Weight:      Height:       Eyes: Anicteric no pallor. ENMT: No discharge from the ears eyes nose or mouth. Neck: No mass felt.  No neck rigidity. Respiratory: No rhonchi or crepitations. Cardiovascular: S1-S2 heard. Abdomen: Soft nontender bowel sounds present. Musculoskeletal: No edema. Skin: No rash. Neurologic: Alert awake oriented to time place and person.  Moves all extremities 5 x 5. Psychiatric: Appears normal.   Labs on Admission: I have personally reviewed following labs and imaging studies  CBC: Recent Labs  Lab 08/23/19 0907 08/29/19 1745  WBC 5.2 6.2  NEUTROABS 3.2 4.5  HGB 11.4* 11.3*  HCT 35.2* 34.7*  MCV 95.7 96.7  PLT 488* 0000000*   Basic Metabolic Panel: Recent Labs  Lab 08/23/19 0907 08/29/19 1745  NA 143 140  K 3.4* 3.1*  CL 102 99  CO2 30 30  GLUCOSE 135* 118*  BUN 12 9  CREATININE 1.31* 0.84  CALCIUM 8.5* 9.1   GFR: Estimated Creatinine Clearance: 45.9 mL/min (by C-G formula based on SCr of 0.84 mg/dL). Liver Function Tests: Recent  Labs  Lab 08/23/19 0907 08/29/19 1745  AST 28 44*  ALT 15 19  ALKPHOS 103 97  BILITOT 0.4 0.7  PROT 6.3* 6.7  ALBUMIN 3.0* 3.3*   No results for input(s): LIPASE, AMYLASE in the last 168 hours. No results for input(s): AMMONIA in the last 168 hours. Coagulation Profile: No results for input(s): INR, PROTIME in the last 168 hours. Cardiac Enzymes: No results for input(s): CKTOTAL, CKMB, CKMBINDEX, TROPONINI in the last 168 hours. BNP (last 3 results) No results for input(s): PROBNP in the last 8760 hours. HbA1C: No results for input(s): HGBA1C in the last 72 hours. CBG: No results for input(s): GLUCAP in the last 168 hours. Lipid Profile: No results for input(s): CHOL, HDL, LDLCALC, TRIG, CHOLHDL, LDLDIRECT in the last 72 hours. Thyroid Function Tests: No results for input(s): TSH, T4TOTAL, FREET4, T3FREE, THYROIDAB in the last 72 hours. Anemia Panel: No results for input(s): VITAMINB12, FOLATE, FERRITIN, TIBC, IRON, RETICCTPCT in the last 72 hours. Urine analysis:    Component Value Date/Time   COLORURINE YELLOW 03/18/2018 1045   APPEARANCEUR CLEAR 03/18/2018 1045   LABSPEC 1.019 03/18/2018 1045   PHURINE 5.0 03/18/2018 1045   GLUCOSEU NEGATIVE 03/18/2018 1045   HGBUR NEGATIVE 03/18/2018 [place] 03/18/2018 [place] 03/18/2018 1045   PROTEINUR NEGATIVE 03/18/2018 1045   UROBILINOGEN 0.2 05/17/2009 1356   NITRITE NEGATIVE 03/18/2018 1045   LEUKOCYTESUR NEGATIVE 03/18/2018 1045   Sepsis Labs: @LABRCNTIP (procalcitonin:4,lacticidven:4) )No results found for this or any previous visit (from the past 240 hour(s)).   Radiological Exams on Admission: CT Head Wo Contrast  Result Date: 08/29/2019 CLINICAL DATA:  Headache, new or worsening, cancer history (Age 13-49y) History of breast cancer, metastatic to bone. EXAM: CT HEAD WITHOUT CONTRAST TECHNIQUE: Contiguous axial images were obtained from the base of the skull through the vertex  without intravenous contrast. COMPARISON:  No prior head CT. FINDINGS: Brain: Ill-defined edema in the right posterior frontal anterior parietal lobe at the high convexity suspicious for underlying  lesion, series 2, image 25. Possible rounded hyperdense lesion abutting the temporal horn of the right lateral ventricle measuring 8 mm. Small periventricular hyperattenuating focus in the left frontal lobe, series 2, image 19. Questionable edema involving the central cerebellum, right more prominent than left, series 2 images 9 through 11. Slight crowding of the basilar cisterns. No hemorrhage or extra-axial collection. Vascular: No hyperdense vessel. Skull: No evidence of blastic or destructive bone lesion. Sinuses/Orbits: Paranasal sinuses and mastoid air cells are clear. The visualized orbits are unremarkable. Other: None. IMPRESSION: 1. Findings suspicious for intracranial metastatic disease with ill-defined edema in the right frontoparietal lobe at the high convexity as well as bilateral cerebellum. Possible hyperdense nodule in the right temporal lobe abutting the lateral ventricle and periventricular left frontal lobe. Recommend MRI without and with IV contrast for further evaluation. 2. There is slight crowding of the basilar cisterns. These results were called by telephone at the time of interpretation on 08/29/2019 at 6:40 pm to Dr [facility] Tyrone Nine , who verbally acknowledged these results. Electronically Signed   By: Keith Rake M.D.   On: 08/29/2019 18:40   MR Brain W and Wo Contrast  Result Date: 08/29/2019 CLINICAL DATA:  Headache with vertigo. History of breast carcinoma. EXAM: MRI HEAD WITHOUT AND WITH CONTRAST TECHNIQUE: Multiplanar, multiecho pulse sequences of the brain and surrounding structures were obtained without and with intravenous contrast. CONTRAST:  13mL GADAVIST GADOBUTROL 1 MMOL/ML IV SOLN COMPARISON:  Head CT 08/29/2019 FINDINGS: Brain: Areas of vasogenic edema within both cerebellar  hemispheres, the right parietal lobe and the brainstem. No abnormal diffusion restriction. There are numerous (approximately 50) contrast-enhancing lesions throughout the brain and cerebellum. Within the cerebellum, there are at least 25 lesions, the largest of which measures 2.5 x 2.0 cm. There are 3 small lesions in the midbrain. Right temporal lobe lesion measures 9 mm. Right basal ganglia lesion measures 7 mm. Superior right parietal lesion measures 11 mm. No chronic microhemorrhage. Mass effect in the cerebellum compresses the fourth ventricle and distal cerebral aqueduct. Vascular: Normal flow voids. Skull and upper cervical spine: Normal marrow signal. Sinuses/Orbits: Negative. Other: None. IMPRESSION: 1. Numerous (approximately 50) intraparenchymal brain metastases within both hemispheres and most greatly concentrated in the cerebellum. 2. Vasogenic edema is greatest in the cerebellum or there is mass effect on the fourth ventricle and distal cerebral aqueduct. Electronically Signed   By: Ulyses Jarred M.D.   On: 08/29/2019 19:54    EKG: Independently reviewed.  Normal sinus rhythm.  Assessment/Plan Principal Problem:   Brain metastasis ([place]) Active Problems:   Malignant neoplasm of lower-outer quadrant of left breast of female, estrogen receptor positive ([place])    1. Metastatic brain lesions with vasogenic edema and mass-effect most of the lesions are concentrated on cerebellum with known history of metastatic breast cancer -on-call oncologist was consulted by ER physician requested placing patient on IV Decadron.  Oncology team will be seeing patient in consult likely will need radiation oncology consult.  Further recommendation per oncology. 2. Hypothyroidism on Synthroid. 3. Mild hypokalemia replace recheck. 4. Recently has been having diarrhea for which patient takes as needed Imodium. 5. Anemia likely related to patient's cancer appears to be stable follow CBC.  Given the multiple  metastatic brain lesions with vasogenic edema and mass-effect patient will need close monitoring for any deterioration and will need inpatient status.   DVT prophylaxis: SCDs for now negative patient of possible procedure. Code Status: Full code confirmed with patient. Family Communication: Discussed with  patient. Disposition Plan: Home when stable. Consults called: ER physician discussed with oncologist. Admission status: Inpatient.   Rise Patience MD [facility] Pager [phone].  If 7PM-7AM, please contact night-coverage [email or web address] Password [facility]  08/29/2019, 10:43 PM

## 2019-08-29 NOTE — ED Notes (Signed)
Patient sent to MRI

## 2019-08-29 NOTE — ED Provider Notes (Signed)
[name] DEPT Provider Note   CSN: [id number] Arrival date & time: 08/29/19  1424     History Chief Complaint  Patient presents with  . Dizziness  . Otalgia    Elizabeth [name] is a 64 y.o. female.  64 yo F with a chief complaint of headache dizziness and now ear pain.  Has been having this going on for about 4 days now.  Patient normally does not get up and move around that much but when she does now she feels like she constantly is falling to the right.  She thinks that maybe the right side is little bit weaker than normal.  Describing a diffuse allover headache that is mildly better today than it was yesterday.  Having some nausea with a headache as well.  Usually worse when she gets up and tries to move around.  She denies cough congestion or fever.  Denies vomiting.  Denies chest pain.  Has chronic shortness of breath that is unchanged.  The history is provided by the patient.  Dizziness Quality:  Imbalance Severity:  Severe Onset quality:  Gradual Duration:  4 days Timing:  Constant Progression:  Worsening Chronicity:  New Relieved by:  Nothing Worsened by:  Movement, standing up and turning head Ineffective treatments:  None tried Associated symptoms: headaches and nausea   Associated symptoms: no chest pain, no palpitations, no shortness of breath and no vomiting   Otalgia Associated symptoms: headaches   Associated symptoms: no congestion, no fever, no rhinorrhea and no vomiting        Past Medical History:  Diagnosis Date  . Arthritis   . Breast cancer ([place]) 2010   Left  . Chronic sinusitis   . Family history of adverse reaction to anesthesia    sister-nausea/vomiting  . History of chemotherapy 01/2009-03/2009  . Hypercholesteremia   . Hypothyroid    "inactive thyroid"  . Metastasis ([place]) 11/2017   LN  . Metastatic cancer to bone ([place]) 11/2017  . Migraines   . RSD (reflex sympathetic dystrophy)   . Vertigo     Patient  Active Problem List   Diagnosis Date Noted  . Malignant neoplasm of lower-outer quadrant of left breast of female, estrogen receptor positive ([place]) 10/21/2018  . Goals of care, counseling/discussion 10/21/2018  . Bone metastasis ([place]) 12/22/2017  . Primary osteoarthritis of left hip 05/26/2017  . Osteoarthritis of left hip 05/23/2017  . Breast cancer of lower-outer quadrant of left female breast ([place]) 08/06/2012    Past Surgical History:  Procedure Laterality Date  . BREAST RECONSTRUCTION    . EYE SURGERY Bilateral 7/18, 10/18  . IR IMAGING GUIDED PORT INSERTION  10/28/2018  . IR INSTILL VIA CHEST TUBE AGENT FOR FIBRINOLYSIS INI DAY  10/09/2018  . IR PATIENT EVAL TECH 0-60 MINS  09/08/2018  . IR PERC PLEURAL DRAIN W/INDWELL CATH W/IMG GUIDE  08/05/2018  . IR RADIOLOGIST EVAL & MGMT  10/12/2018  . IR REMOVAL OF PLURAL CATH W/CUFF  10/26/2018  . KNEE ARTHROSCOPY Bilateral   . KNEE SURGERY Right    Two surgeries  . MASTECTOMY Bilateral 05/22/2009  . MASTECTOMY Bilateral 2011   restrictions on left arm-no labs/bloodpressure  . NASAL SINUS SURGERY    . PORTA CATH INSERTION  12/2008  . PORTA CATH REMOVAL  05/2009  . ROOT CANAL    . SHOULDER SURGERY Right   . TOTAL HIP ARTHROPLASTY Left 05/26/2017   Procedure: TOTAL HIP ARTHROPLASTY ANTERIOR APPROACH;  Surgeon: Frederik Pear,  MD;  Location: [facility];  Service: Orthopedics;  Laterality: Left;     OB History   No obstetric history on file.     Family History  Problem Relation Age of Onset  . Hypertension Mother     Social History   Tobacco Use  . Smoking status: Never Smoker  . Smokeless tobacco: Never Used  Substance Use Topics  . Alcohol use: No  . Drug use: No    Home Medications Prior to Admission medications   Medication Sig Start Date End Date Taking? Authorizing Provider  alpelisib (PIQRAY 300MG  DAILY DOSE) 2 x 150 MG Therapy Pack Take two 150mg  tablets with food at the same time daily. Swallow whole, do not crush, chew, or  split. 05/03/19   Nicholas Lose, MD  Biotin 10000 MCG TABS Take 10,000 mcg by mouth daily.     [provider]  Cholecalciferol (VITAMIN D PO) Take 5,000 Units by mouth daily.     [provider]  dexamethasone (DECADRON) 1 MG tablet Take 1 tablet (1 mg total) by mouth daily. 06/14/19   Nicholas Lose, MD  DULoxetine (CYMBALTA) 30 MG capsule TAKE 1 CAPSULE (30 MG TOTAL) BY MOUTH 2 (TWO) TIMES DAILY. Patient not taking: Reported on 06/23/2019 10/13/18   Nicholas Lose, MD  fentaNYL (DURAGESIC) 25 MCG/HR Place 1 patch onto the skin every 3 (three) days. 05/20/19   Nicholas Lose, MD  gabapentin (NEURONTIN) 300 MG capsule Take 1 capsule (300 mg total) by mouth 2 (two) times daily. 08/10/19   Nicholas Lose, MD  KLOR-CON M20 20 MEQ tablet TAKE 1 TABLET BY MOUTH EVERY DAY Patient not taking: Reported on 06/23/2019 05/11/19   Nicholas Lose, MD  letrozole [facility]) 2.5 MG tablet Take 1 tablet (2.5 mg total) by mouth daily. 05/03/19   Nicholas Lose, MD  levothyroxine (SYNTHROID, LEVOTHROID) 25 MCG tablet Take 25 mcg by mouth daily before breakfast.     [provider]  lidocaine-prilocaine (EMLA) cream Apply to affected area once 08/23/19   Nicholas Lose, MD  loperamide (IMODIUM) 2 MG capsule 1 to 2 PO QID prn diarrhea 10/05/18   Tanner, Lyndon Code., PA-C  LORazepam (ATIVAN) 0.5 MG tablet Patient to take 1 tablet, 0.5mg  1 hour prior to scan, may repeat X 1 if needed. 08/10/19   Nicholas Lose, MD  LORazepam (ATIVAN) 1 MG tablet SMARTSIG:1 Tablet(s) By Mouth As Needed 02/17/19   [provider]  magic mouthwash w/lidocaine SOLN Take 5 mLs by mouth 3 (three) times daily as needed for mouth pain. 11/13/18   Nicholas Lose, MD  Multiple Vitamin (MULTIVITAMIN) tablet Take 1 tablet by mouth daily.    [provider]  mupirocin ointment (BACTROBAN) 2 % Apply to skin lesion QID until better 07/12/19   Harle Stanford., PA-C  omeprazole (PRILOSEC) 20 MG capsule Take 1 capsule (20 mg total) by mouth  daily. 01/11/19   Nicholas Lose, MD  ondansetron (ZOFRAN) 8 MG tablet Take 1 tablet (8 mg total) by mouth every 8 (eight) hours as needed for nausea. 05/24/19   Nicholas Lose, MD  oxyCODONE-acetaminophen (PERCOCET) 10-325 MG tablet Take 1 tablet by mouth every 8 (eight) hours as needed for pain. 07/26/19   Nicholas Lose, MD  prednisoLONE acetate (PRED FORTE) 1 % ophthalmic suspension Place 1 drop into both eyes daily.    [provider]  prochlorperazine (COMPAZINE) 10 MG tablet Take 1 tablet (10 mg total) by mouth every 6 (six) hours as needed (Nausea or vomiting). 08/23/19  Nicholas Lose, MD  prochlorperazine (COMPAZINE) 5 MG tablet Take 1 tablet (5 mg total) by mouth every 6 (six) hours as needed for nausea or vomiting. Patient not taking: Reported on 06/23/2019 10/05/18   Harle Stanford., PA-C  simethicone (GAS-X) 80 MG chewable tablet Chew 1 tablet (80 mg total) by mouth every 6 (six) hours as needed for flatulence. 10/05/18   Tanner, Lyndon Code., PA-C  traMADol (ULTRAM) 50 MG tablet Take 1 tablet (50 mg total) by mouth every 6 (six) hours as needed. 05/03/19   Nicholas Lose, MD    Allergies    Shellfish allergy and Sulfa antibiotics  Review of Systems   Review of Systems  Constitutional: Negative for chills and fever.  HENT: Positive for ear pain. Negative for congestion and rhinorrhea.   Eyes: Negative for redness and visual disturbance.  Respiratory: Negative for shortness of breath and wheezing.   Cardiovascular: Negative for chest pain and palpitations.  Gastrointestinal: Positive for nausea. Negative for vomiting.  Genitourinary: Negative for dysuria and urgency.  Musculoskeletal: Negative for arthralgias and myalgias.  Skin: Negative for pallor and wound.  Neurological: Positive for dizziness and headaches.    Physical Exam Updated Vital Signs BP (!) 145/98   Pulse 90   Temp 98.1 F (36.7 C) (Oral)   Resp 20   Ht 5\' 3"  (1.6 m)   Wt 43.5 kg   SpO2 93%   BMI 17.01 kg/m    Physical Exam Vitals and nursing note reviewed.  Constitutional:      General: She is not in acute distress.    Appearance: She is well-developed. She is not diaphoretic.     Comments: Cachectic  HENT:     Head: Normocephalic and atraumatic.     Comments: Swollen turbinates, posterior nasal drip, mild sinus tenderness to percussion of the right frontal sinus and the left maxillary.,  TM with effusion bilaterally. Eyes:     Pupils: Pupils are equal, round, and reactive to light.  Cardiovascular:     Rate and Rhythm: Normal rate and regular rhythm.     Heart sounds: No murmur. No friction rub. No gallop.   Pulmonary:     Effort: Pulmonary effort is normal.     Breath sounds: No wheezing or rales.  Abdominal:     General: There is no distension.     Palpations: Abdomen is soft.     Tenderness: There is no abdominal tenderness.  Musculoskeletal:        General: No tenderness.     Cervical back: Normal range of motion and neck supple.  Skin:    General: Skin is warm and dry.  Neurological:     Mental Status: She is alert and oriented to person, place, and time.  Psychiatric:        Behavior: Behavior normal.     ED Results / Procedures / Treatments   Labs (all labs ordered are listed, but only abnormal results are displayed) Labs Reviewed  CBC WITH DIFFERENTIAL/PLATELET - Abnormal; Notable for the following components:      Result Value   RBC 3.59 (*)    Hemoglobin 11.3 (*)    HCT 34.7 (*)    Platelets 518 (*)    All other components within normal limits  COMPREHENSIVE METABOLIC PANEL - Abnormal; Notable for the following components:   Potassium 3.1 (*)    Glucose, Bld 118 (*)    Albumin 3.3 (*)    AST 44 (*)    All other  components within normal limits  SARS CORONAVIRUS 2 BY RT PCR [facility] ORDER, [facility])  TROPONIN I (HIGH SENSITIVITY)    EKG EKG Interpretation  Date/Time:  Sunday Aug 29 2019 17:34:31 EDT Ventricular Rate:   89 PR Interval:    QRS Duration: 89 QT Interval:  387 QTC Calculation: 471 R Axis:   -38 Text Interpretation: Sinus rhythm Left axis deviation Low voltage, precordial leads Borderline T abnormalities, anterior leads Since last tracing rate slower Otherwise no significant change Confirmed by Deno Etienne [phone]) on 08/29/2019 5:52:56 PM   Radiology CT Head Wo Contrast  Result Date: 08/29/2019 CLINICAL DATA:  Headache, new or worsening, cancer history (Age 1-49y) History of breast cancer, metastatic to bone. EXAM: CT HEAD WITHOUT CONTRAST TECHNIQUE: Contiguous axial images were obtained from the base of the skull through the vertex without intravenous contrast. COMPARISON:  No prior head CT. FINDINGS: Brain: Ill-defined edema in the right posterior frontal anterior parietal lobe at the high convexity suspicious for underlying lesion, series 2, image 25. Possible rounded hyperdense lesion abutting the temporal horn of the right lateral ventricle measuring 8 mm. Small periventricular hyperattenuating focus in the left frontal lobe, series 2, image 19. Questionable edema involving the central cerebellum, right more prominent than left, series 2 images 9 through 11. Slight crowding of the basilar cisterns. No hemorrhage or extra-axial collection. Vascular: No hyperdense vessel. Skull: No evidence of blastic or destructive bone lesion. Sinuses/Orbits: Paranasal sinuses and mastoid air cells are clear. The visualized orbits are unremarkable. Other: None. IMPRESSION: 1. Findings suspicious for intracranial metastatic disease with ill-defined edema in the right frontoparietal lobe at the high convexity as well as bilateral cerebellum. Possible hyperdense nodule in the right temporal lobe abutting the lateral ventricle and periventricular left frontal lobe. Recommend MRI without and with IV contrast for further evaluation. 2. There is slight crowding of the basilar cisterns. These results were called by telephone at  the time of interpretation on 08/29/2019 at 6:40 pm to Dr [facility] Tyrone Nine , who verbally acknowledged these results. Electronically Signed   By: Keith Rake M.D.   On: 08/29/2019 18:40   MR Brain W and Wo Contrast  Result Date: 08/29/2019 CLINICAL DATA:  Headache with vertigo. History of breast carcinoma. EXAM: MRI HEAD WITHOUT AND WITH CONTRAST TECHNIQUE: Multiplanar, multiecho pulse sequences of the brain and surrounding structures were obtained without and with intravenous contrast. CONTRAST:  26mL GADAVIST GADOBUTROL 1 MMOL/ML IV SOLN COMPARISON:  Head CT 08/29/2019 FINDINGS: Brain: Areas of vasogenic edema within both cerebellar hemispheres, the right parietal lobe and the brainstem. No abnormal diffusion restriction. There are numerous (approximately 50) contrast-enhancing lesions throughout the brain and cerebellum. Within the cerebellum, there are at least 25 lesions, the largest of which measures 2.5 x 2.0 cm. There are 3 small lesions in the midbrain. Right temporal lobe lesion measures 9 mm. Right basal ganglia lesion measures 7 mm. Superior right parietal lesion measures 11 mm. No chronic microhemorrhage. Mass effect in the cerebellum compresses the fourth ventricle and distal cerebral aqueduct. Vascular: Normal flow voids. Skull and upper cervical spine: Normal marrow signal. Sinuses/Orbits: Negative. Other: None. IMPRESSION: 1. Numerous (approximately 50) intraparenchymal brain metastases within both hemispheres and most greatly concentrated in the cerebellum. 2. Vasogenic edema is greatest in the cerebellum or there is mass effect on the fourth ventricle and distal cerebral aqueduct. Electronically Signed   By: Ulyses Jarred M.D.   On: 08/29/2019 19:54    Procedures Procedures (including  critical care time)  Medications Ordered in ED Medications  dexamethasone (DECADRON) injection 10 mg (has no administration in time range)  dexamethasone (DECADRON) injection 8 mg (has no administration in  time range)  sodium chloride 0.9 % bolus 1,000 mL (0 mLs Intravenous Stopped 08/29/19 1904)  prochlorperazine (COMPAZINE) injection 10 mg (10 mg Intravenous Given 08/29/19 1743)  diphenhydrAMINE (BENADRYL) injection 25 mg (25 mg Intravenous Given 08/29/19 1743)  diazepam (VALIUM) injection 5 mg (5 mg Intravenous Given 08/29/19 1859)  gadobutrol (GADAVIST) 1 MMOL/ML injection 5 mL (5 mLs Intravenous Contrast Given 08/29/19 1936)    ED Course  I have reviewed the triage vital signs and the nursing notes.  Pertinent labs & imaging results that were available during my care of the patient were reviewed by me and considered in my medical decision making (see chart for details).    MDM Rules/Calculators/A&P                      64 yo F with a chief complaints of headache and dizziness.  Going on for about 4 days.  Having some nausea with it.  No history of the same.  Patient unfortunately has a history of breast cancer with metastasis to the bone and recurrent pleural effusions.  No prior history of metastasis to the brain.  She has a wavering finger-to-nose with the left upper and left lower extremity.  Will obtain a CT scan of the head initially.  Treat with a headache cocktail.  Reassess.  Patient feeling better after headache cocktail although is a bit sleepy.  She is requesting medication for her MRI as she is claustrophobic.  CT scan of the head with concern for possible metastasis to the brain.  MRI is consistent with the same.  Numerous metastases with vasogenic edema mostly in the cerebellum.  I discussed the case with Dr. Maylon Peppers, medical oncology.  He recommended starting the patient on Decadron.  He feels that she needs to come to the hospital and likely needs a consult to radiation oncology in the morning.  The patients results and plan were reviewed and discussed.   Any x-rays performed were independently reviewed by myself.   Differential diagnosis were considered with the presenting  HPI.  Medications  dexamethasone (DECADRON) injection 10 mg (has no administration in time range)  dexamethasone (DECADRON) injection 8 mg (has no administration in time range)  sodium chloride 0.9 % bolus 1,000 mL (0 mLs Intravenous Stopped 08/29/19 1904)  prochlorperazine (COMPAZINE) injection 10 mg (10 mg Intravenous Given 08/29/19 1743)  diphenhydrAMINE (BENADRYL) injection 25 mg (25 mg Intravenous Given 08/29/19 1743)  diazepam (VALIUM) injection 5 mg (5 mg Intravenous Given 08/29/19 1859)  gadobutrol (GADAVIST) 1 MMOL/ML injection 5 mL (5 mLs Intravenous Contrast Given 08/29/19 1936)    Vitals:   08/29/19 1734 08/29/19 1745 08/29/19 1800 08/29/19 2000  BP: 129/86  131/83 (!) 145/98  Pulse: 90 90    Resp: (!) 31 (!) 40 20   Temp:      TempSrc:      SpO2: 93% 93%    Weight:      Height:        Final diagnoses:  Metastasis from breast cancer ([place])  Vasogenic brain edema ([place])    Admission/ observation were discussed with the admitting physician, patient and/or family and they are comfortable with the plan.   Final Clinical Impression(s) / ED Diagnoses Final diagnoses:  Metastasis from breast cancer ([place])  Vasogenic brain edema (  [facility])    Rx / DC Orders ED Discharge Orders    None       Deno Etienne, DO 08/29/19 2038

## 2019-08-29 NOTE — ED Notes (Addendum)
Called and spoke to patient's husband, Octavia Bruckner. Notified that patient will be admitted to room 1511 unless something changes. [phone] is his number.

## 2019-08-29 NOTE — ED Triage Notes (Signed)
Patient states that she has a history of vertigo. Patient reports symptoms x 4 days. Patient also c/o left ear pain that started today.

## 2019-08-30 ENCOUNTER — Inpatient Hospital Stay: Payer: Medicare Other

## 2019-08-30 ENCOUNTER — Ambulatory Visit
Admit: 2019-08-30 | Discharge: 2019-08-30 | Disposition: A | Discharge status: Home / Self Care | Payer: Medicare Other | Attending: Radiation Oncology | Admitting: Radiation Oncology

## 2019-08-30 ENCOUNTER — Inpatient Hospital Stay (HOSPITAL_COMMUNITY): Payer: Medicare Other

## 2019-08-30 ENCOUNTER — Other Ambulatory Visit (HOSPITAL_COMMUNITY): Payer: Medicare Other

## 2019-08-30 ENCOUNTER — Other Ambulatory Visit: Payer: Self-pay | Admitting: Radiation Oncology

## 2019-08-30 DIAGNOSIS — C50512 Malignant neoplasm of lower-outer quadrant of left female breast: Secondary | ICD-10-CM

## 2019-08-30 DIAGNOSIS — C7931 Secondary malignant neoplasm of brain: Secondary | ICD-10-CM

## 2019-08-30 LAB — CBC WITH DIFFERENTIAL/PLATELET
Abs Immature Granulocytes: 0.06 10*3/uL (ref 0.00–0.07)
Basophils Absolute: 0.1 10*3/uL (ref 0.0–0.1)
Basophils Relative: 1 %
Eosinophils Absolute: 0 10*3/uL (ref 0.0–0.5)
Eosinophils Relative: 0 %
HCT: 34.4 % — ABNORMAL LOW (ref 36.0–46.0)
Hemoglobin: 11.3 g/dL — ABNORMAL LOW (ref 12.0–15.0)
Immature Granulocytes: 1 %
Lymphocytes Relative: 9 %
Lymphs Abs: 0.7 10*3/uL (ref 0.7–4.0)
MCH: 31.7 pg (ref 26.0–34.0)
MCHC: 32.8 g/dL (ref 30.0–36.0)
MCV: 96.6 fL (ref 80.0–100.0)
Monocytes Absolute: 0.3 10*3/uL (ref 0.1–1.0)
Monocytes Relative: 4 %
Neutro Abs: 7.3 10*3/uL (ref 1.7–7.7)
Neutrophils Relative %: 85 %
Platelets: 526 10*3/uL — ABNORMAL HIGH (ref 150–400)
RBC: 3.56 MIL/uL — ABNORMAL LOW (ref 3.87–5.11)
RDW: 15.2 % (ref 11.5–15.5)
WBC: 8.4 10*3/uL (ref 4.0–10.5)
nRBC: 0 % (ref 0.0–0.2)

## 2019-08-30 LAB — HEPATIC FUNCTION PANEL
ALT: 18 U/L (ref 0–44)
AST: 38 U/L (ref 15–41)
Albumin: 3.1 g/dL — ABNORMAL LOW (ref 3.5–5.0)
Alkaline Phosphatase: 99 U/L (ref 38–126)
Bilirubin, Direct: 0.1 mg/dL (ref 0.0–0.2)
Indirect Bilirubin: 0.6 mg/dL (ref 0.3–0.9)
Total Bilirubin: 0.7 mg/dL (ref 0.3–1.2)
Total Protein: 6.2 g/dL — ABNORMAL LOW (ref 6.5–8.1)

## 2019-08-30 LAB — BASIC METABOLIC PANEL
Anion gap: 11 (ref 5–15)
BUN: 9 mg/dL (ref 8–23)
CO2: 26 mmol/L (ref 22–32)
Calcium: 8.4 mg/dL — ABNORMAL LOW (ref 8.9–10.3)
Chloride: 102 mmol/L (ref 98–111)
Creatinine, Ser: 0.8 mg/dL (ref 0.44–1.00)
GFR calc Af Amer: >60 mL/min (ref >60)
GFR calc non Af Amer: >60 mL/min (ref >60)
Glucose, Bld: 151 mg/dL — ABNORMAL HIGH (ref 70–99)
Potassium: 3.3 mmol/L — ABNORMAL LOW (ref 3.5–5.1)
Sodium: 139 mmol/L (ref 135–145)

## 2019-08-30 LAB — SARS CORONAVIRUS 2 BY RT PCR (HOSPITAL ORDER, PERFORMED IN ~~LOC~~ HOSPITAL LAB): SARS Coronavirus 2: NEGATIVE

## 2019-08-30 LAB — HIV ANTIBODY (ROUTINE TESTING W REFLEX): HIV Screen 4th Generation wRfx: NONREACTIVE

## 2019-08-30 MED ORDER — OXYCODONE-ACETAMINOPHEN 5-325 MG PO TABS
1.0000 | ORAL_TABLET | Freq: Three times a day (TID) | ORAL | Status: DC | PRN
  Administered 2019-08-30 – 2019-09-15 (×24): 1 via ORAL
  Filled 2019-08-30 (×25): qty 1

## 2019-08-30 MED ORDER — GABAPENTIN 300 MG PO CAPS
300.0000 mg | ORAL_CAPSULE | Freq: Once | ORAL | Status: DC
  Filled 2019-08-30 (×5): qty 1

## 2019-08-30 MED ORDER — OXYCODONE HCL 5 MG PO TABS
5.0000 mg | ORAL_TABLET | Freq: Three times a day (TID) | ORAL | Status: DC | PRN
  Administered 2019-08-30 – 2019-09-15 (×21): 5 mg via ORAL
  Filled 2019-08-30 (×21): qty 1

## 2019-08-30 MED ORDER — LIDOCAINE HCL 1 % IJ SOLN
INTRAMUSCULAR | Status: AC
  Filled 2019-08-30: qty 20

## 2019-08-30 MED ORDER — LORAZEPAM 0.5 MG PO TABS: ORAL_TABLET | ORAL | 0 refills | Status: DC

## 2019-08-30 MED ORDER — DEXAMETHASONE 4 MG PO TABS
4.0000 mg | ORAL_TABLET | Freq: Three times a day (TID) | ORAL | Status: DC
  Administered 2019-08-31 – 2019-09-15 (×45): 4 mg via ORAL
  Filled 2019-08-30 (×46): qty 1

## 2019-08-30 MED ORDER — POTASSIUM CHLORIDE CRYS ER 20 MEQ PO TBCR
40.0000 meq | EXTENDED_RELEASE_TABLET | Freq: Once | ORAL | Status: AC
  Administered 2019-08-30: 40 meq via ORAL
  Filled 2019-08-30: qty 2

## 2019-08-30 MED ORDER — CHLORHEXIDINE GLUCONATE CLOTH 2 % EX PADS
6.0000 | MEDICATED_PAD | Freq: Every day | CUTANEOUS | Status: DC
  Administered 2019-08-30 – 2019-09-14 (×16): 6 via TOPICAL

## 2019-08-30 MED ORDER — SODIUM CHLORIDE 0.9% FLUSH
10.0000 mL | INTRAVENOUS | Status: DC | PRN
  Administered 2019-09-02 – 2019-09-15 (×4): 10 mL

## 2019-08-30 NOTE — Progress Notes (Addendum)
[facility]  PROGRESS NOTE  KEMARIA CRUNKLETON [id number] DOB: 03-09-1956 DOA: 08/29/2019 PCP: [name], PA   Brief HPI:   64 year old female with history of metastatic breast cancer, hypothyroidism who has been experiencing dizziness for past 3 to 4 days with frequent falls.  Patient presented to the hospital CT head showed metastatic lesions and MRI brain showed multiple metastatic lesions mostly in the cerebellum with mass-effect.  Patient was started on IV Decadron.  Oncology was consulted.    Subjective   Patient seen and examined, says dizziness has improved.  Eating her breakfast.   Assessment/Plan:     1. Metastatic brain lesion-patient has vasogenic edema with mass-effect seen on the MRI brain.  Started on IV Decadron 8 mg 3 times daily.  Patient will need radiation oncology consultation, oncology has informed radiation oncology for radiation.  We will continue to monitor. 2. Hypokalemia-we will replace potassium with K. Dur 40 mg p.o. x1.  She already received 1 dose in the ED, follow BMP in a.m. 3. Hypothyroidism-continue Synthroid 4. Left pleural effusion-patient has chronic left-sided loculated pleural effusion.  No shortness of breath or hypoxia.  Patient was supposed to get thoracentesis as outpatient.  Oncology has ordered ultrasound-guided thoracentesis.  Will monitor. 5. Anemia of chronic disease-likely from underlying  malignancy, follow CBC in a.m. 6. Metastatic breast cancer-patient on chemotherapy per oncology.    SpO2: 95 %   COVID-19 Labs  No results for input(s): DDIMER, FERRITIN, LDH, CRP in the last 72 hours.  Lab Results  Component Value Date   SARSCOV2NAA NEGATIVE 08/29/2019   SARSCOV2NAA NEGATIVE 10/26/2018   [place] NEGATIVE 10/23/2018   [place] NEGATIVE 10/09/2018     CBG: No results for input(s): GLUCAP in the last 168 hours.  CBC: Recent Labs  Lab 08/29/19 1745 08/30/19 0100  WBC 6.2 8.4  NEUTROABS 4.5 7.3  HGB  11.3* 11.3*  HCT 34.7* 34.4*  MCV 96.7 96.6  PLT 518* 526*    Basic Metabolic Panel: Recent Labs  Lab 08/29/19 1745 08/30/19 0100  NA 140 139  K 3.1* 3.3*  CL 99 102  CO2 30 26  GLUCOSE 118* 151*  BUN 9 9  CREATININE 0.84 0.80  CALCIUM 9.1 8.4*     Liver Function Tests: Recent Labs  Lab 08/29/19 1745 08/30/19 0100  AST 44* 38  ALT 19 18  ALKPHOS 97 99  BILITOT 0.7 0.7  PROT 6.7 6.2*  ALBUMIN 3.3* 3.1*        DVT prophylaxis: SCDs  Code Status: Full code  Family Communication: No family at bedside  Disposition Plan:   Status is: Inpatient  Dispo: The patient is from: Home              Anticipated d/c is to: Home              Anticipated d/c date is: 09/01/2019              Patient currently admitted with brain metastasis, has dizziness, vasogenic edema.  She is on IV Decadron.  Barrier to discharge-we will need radiation treatment for radiation oncology.        Scheduled medications:  . Chlorhexidine Gluconate Cloth  6 each Topical Daily  . dexamethasone (DECADRON) injection  8 mg Intravenous TID  . gabapentin  300 mg Oral BID  . gabapentin  300 mg Oral Once  . levothyroxine  25 mcg Oral QAC breakfast  . prednisoLONE acetate  1 drop Both Eyes Daily  Consultants:  Oncology  Procedures:    Antibiotics:   Anti-infectives (From admission, onward)   None       Objective   Vitals:   08/29/19 2300 08/29/19 2329 08/30/19 0602 08/30/19 0736  BP: 108/73 (!) 149/95 101/78 118/86  Pulse: 97 97 100 (!) 102  Resp: (!) 21 20 20 15   Temp:  98.4 F (36.9 C) 98.3 F (36.8 C) 97.9 F (36.6 C)  TempSrc:   Oral Oral  SpO2: 100% 94% 92% 95%  Weight:      Height:       No intake or output data in the 24 hours ending 08/30/19 1037  No intake/output data recorded.  Filed Weights   08/29/19 1459  Weight: 43.5 kg    Physical Examination:    General: Appears in no acute distress  Cardiovascular: S1-S2, regular, no murmur  auscultated  Respiratory: Clear to auscultation bilaterally  Abdomen: Abdomen is soft, nontender, no organomegaly  Extremities: No edema in the lower extremities  Neurologic: Alert, noted x3, no focal deficit noted    Data Reviewed:   Recent Results (from the past 240 hour(s))  SARS Coronavirus 2 by RT PCR (hospital order, performed in [facility] hospital lab) Nasopharyngeal Nasopharyngeal Swab     Status: None   Collection Time: 08/29/19  8:34 PM   Specimen: Nasopharyngeal Swab  Result Value Ref Range Status   SARS Coronavirus 2 NEGATIVE NEGATIVE Final    Comment: (NOTE) SARS-CoV-2 target nucleic acids are NOT DETECTED. The SARS-CoV-2 RNA is generally detectable in upper and lower respiratory specimens during the acute phase of infection. The lowest concentration of SARS-CoV-2 viral copies this assay can detect is 250 copies / mL. A negative result does not preclude SARS-CoV-2 infection and should not be used as the sole basis for treatment or other patient management decisions.  A negative result may occur with improper specimen collection / handling, submission of specimen other than nasopharyngeal swab, presence of viral mutation(s) within the areas targeted by this assay, and inadequate number of viral copies (<250 copies / mL). A negative result must be combined with clinical observations, patient history, and epidemiological information. Fact Sheet for Patients:   StrictlyIdeas.no Fact Sheet for Healthcare Providers: BankingDealers.co.za This test is not yet approved or cleared  by the [place] FDA and has been authorized for detection and/or diagnosis of SARS-CoV-2 by FDA under an Emergency Use Authorization (EUA).  This EUA will remain in effect (meaning this test can be used) for the duration of the COVID-19 declaration under Section 564(b)(1) of the Act, 21 U.S.C. section 360bbb-3(b)(1), unless the authorization  is terminated or revoked sooner. Performed at [facility], [place] [address], [place],  [address]     No results for input(s): LIPASE, AMYLASE in the last 168 hours. No results for input(s): AMMONIA in the last 168 hours.  Cardiac Enzymes: No results for input(s): CKTOTAL, CKMB, CKMBINDEX, TROPONINI in the last 168 hours. BNP (last 3 results) No results for input(s): BNP in the last 8760 hours.  ProBNP (last 3 results) No results for input(s): PROBNP in the last 8760 hours.  Studies:  CT Head Wo Contrast  Result Date: 08/29/2019 CLINICAL DATA:  Headache, new or worsening, cancer history (Age 22-49y) History of breast cancer, metastatic to bone. EXAM: CT HEAD WITHOUT CONTRAST TECHNIQUE: Contiguous axial images were obtained from the base of the skull through the vertex without intravenous contrast. COMPARISON:  No prior head CT. FINDINGS: Brain: Ill-defined edema in  the right posterior frontal anterior parietal lobe at the high convexity suspicious for underlying lesion, series 2, image 25. Possible rounded hyperdense lesion abutting the temporal horn of the right lateral ventricle measuring 8 mm. Small periventricular hyperattenuating focus in the left frontal lobe, series 2, image 19. Questionable edema involving the central cerebellum, right more prominent than left, series 2 images 9 through 11. Slight crowding of the basilar cisterns. No hemorrhage or extra-axial collection. Vascular: No hyperdense vessel. Skull: No evidence of blastic or destructive bone lesion. Sinuses/Orbits: Paranasal sinuses and mastoid air cells are clear. The visualized orbits are unremarkable. Other: None. IMPRESSION: 1. Findings suspicious for intracranial metastatic disease with ill-defined edema in the right frontoparietal lobe at the high convexity as well as bilateral cerebellum. Possible hyperdense nodule in the right temporal lobe abutting the lateral ventricle and periventricular left  frontal lobe. Recommend MRI without and with IV contrast for further evaluation. 2. There is slight crowding of the basilar cisterns. These results were called by telephone at the time of interpretation on 08/29/2019 at 6:40 pm to Dr [facility] , who verbally acknowledged these results. Electronically Signed   By: Keith Rake M.D.   On: 08/29/2019 18:40   MR Brain W and Wo Contrast  Result Date: 08/29/2019 CLINICAL DATA:  Headache with vertigo. History of breast carcinoma. EXAM: MRI HEAD WITHOUT AND WITH CONTRAST TECHNIQUE: Multiplanar, multiecho pulse sequences of the brain and surrounding structures were obtained without and with intravenous contrast. CONTRAST:  20mL GADAVIST GADOBUTROL 1 MMOL/ML IV SOLN COMPARISON:  Head CT 08/29/2019 FINDINGS: Brain: Areas of vasogenic edema within both cerebellar hemispheres, the right parietal lobe and the brainstem. No abnormal diffusion restriction. There are numerous (approximately 50) contrast-enhancing lesions throughout the brain and cerebellum. Within the cerebellum, there are at least 25 lesions, the largest of which measures 2.5 x 2.0 cm. There are 3 small lesions in the midbrain. Right temporal lobe lesion measures 9 mm. Right basal ganglia lesion measures 7 mm. Superior right parietal lesion measures 11 mm. No chronic microhemorrhage. Mass effect in the cerebellum compresses the fourth ventricle and distal cerebral aqueduct. Vascular: Normal flow voids. Skull and upper cervical spine: Normal marrow signal. Sinuses/Orbits: Negative. Other: None. IMPRESSION: 1. Numerous (approximately 50) intraparenchymal brain metastases within both hemispheres and most greatly concentrated in the cerebellum. 2. Vasogenic edema is greatest in the cerebellum or there is mass effect on the fourth ventricle and distal cerebral aqueduct. Electronically Signed   By: Ulyses Jarred M.D.   On: 08/29/2019 19:54       Comfort   [facility] If 7PM-7AM, please contact  night-coverage at [email or web address], Office  [phone]   08/30/2019, 10:37 AM  LOS: 1 day

## 2019-08-30 NOTE — Progress Notes (Signed)
Entered patient's room to give 1800 meds to find her port a cath had  Been deaccessed.  Patient states she is not sure who removed the line but it was found laying on the sink without the safety engaged.  Order entered for IV team to come reaccess port

## 2019-08-30 NOTE — Procedures (Signed)
Ultrasound-guided diagnostic and therapeutic right  thoracentesis performed yielding 700 cc of yellow fluid. No immediate complications. Follow-up chest x-ray pending.The fluid was sent to the lab for cytology. EBL none.

## 2019-08-30 NOTE — Progress Notes (Signed)
PHARMACIST - PHYSICIAN ORDER COMMUNICATION  CONCERNING: P&T Medication Policy on Herbal Medications  DESCRIPTION:  This patient's order for:  Biotin  has been noted.  This product(s) is classified as an "herbal" or natural product. Due to a lack of definitive safety studies or FDA approval, nonstandard manufacturing practices, plus the potential risk of unknown drug-drug interactions while on inpatient medications, the Pharmacy and Therapeutics Committee does not permit the use of "herbal" or natural products of this type within [facility].   ACTION TAKEN: The pharmacy department is unable to verify this order at this time and your patient has been informed of this safety policy. Please reevaluate patient's clinical condition at discharge and address if the herbal or natural product(s) should be resumed at that time.  Dia Sitter, PharmD, BCPS 08/30/2019 12:33 AM

## 2019-08-30 NOTE — Progress Notes (Addendum)
HEMATOLOGY-ONCOLOGY PROGRESS NOTE  SUBJECTIVE: Ms. Elizabeth [name] presented to the hospital with dizziness.  She reports that she was having dizziness for at least 3 to 4 days, leaning to the right when walking, falls, and headache.  MRI of the brain was performed which showed numerous intraparenchymal brain metastases within both hemispheres and most greatly concentrated in the cerebellum with vasogenic edema greatest in the cerebellum.  She has been started on dexamethasone.  This morning, she reports that her dizziness and headaches have improved.  Reports that her breathing is stable.  She is asking about the thoracentesis that was supposed to be done as an outpatient later this week.  Oncology History  Breast cancer of lower-outer quadrant of left female breast ([place])  12/13/2008 Initial Diagnosis   Left breast biopsy: Invasive ductal carcinoma ER 90% percent, PR 41%, Ki-67 11%, HER-2 negative ratio 1, Oncotype DX score 23, ROR15%   01/27/2009 - 03/10/2009 Neo-Adjuvant Chemotherapy   Neoadjuvant FEC 4; participant in the "bed sheets" study.   05/12/2009 -  Anti-estrogen oral therapy   Femara 2.5 mg daily   05/22/2009 Surgery   Bilateral mastectomy stage IIB T2 N0 M0 IDC left breast grade 1, 2.5 cm, ER 99%, PR 41%, Ki-67 11%, HER-2 negative   12/10/2009 Surgery   Breast reconstruction surgery   11/14/2017 Relapse/Recurrence   Mid back pain: MRI revealed T11 severe pathologic fracture, abnormal signal T10, T11 and T12, moderate canal stenosis at T11   12/03/2017 PET scan   Hypermetabolic metastatic breast cancer involving thoracic and upper abdominal lymph nodes, spine and ribs. There may be hypermetabolic adenopathy in the left neck; Hypermetabolic lymph node versus intramuscular metastasis involving the medial left pectoralis musculature Pathologic T11 fracture     12/11/2017 Procedure   Biopsy of T11 vertebral body lytic lesion: Metastatic breast adenocarcinoma ER positive, PR negative    12/19/2017 - 12/26/2017 Radiation Therapy   Palliative XRT to T 11   01/12/2018 - 10/26/2018 Anti-estrogen oral therapy   Patient could not tolerate Faslodex because of severe hypotension and severe pain in the legs after injections.  Ibrance with letrozole starting 01/12/2018   10/27/2018 -  Chemotherapy   Palliative chemotherapy with [place]   05/03/2019 -  Anti-estrogen oral therapy   Alpelisib with Faslodex   Malignant neoplasm of lower-outer quadrant of left breast of female, estrogen receptor positive ([place])  05/02/2018 Miscellaneous   Alpelisib with Faslodex discontinued 08/23/2019 for progression   10/21/2018 Initial Diagnosis   Malignant neoplasm of lower-outer quadrant of left breast of female, estrogen receptor positive ([place])   10/27/2018 - 05/02/2019 Chemotherapy   The patient had eriBULin mesylate (HALAVEN) 1.65 mg in sodium chloride 0.9 % 100 mL chemo infusion, 1.1 mg/m2 = 2.1 mg, Intravenous,  Once, 9 of 10 cycles Dose modification: 1.1 mg/m2 (original dose 1.4 mg/m2, Cycle 1, Reason: Other (see comments), Comment: CrCL < 50) Administration: 1.65 mg (10/27/2018), 1.65 mg (11/03/2018), 1.65 mg (11/17/2018), 1.65 mg (11/24/2018), 1.65 mg (12/08/2018), 1.65 mg (12/15/2018), 1.65 mg (12/29/2018), 1.65 mg (01/18/2019), 1.65 mg (02/08/2019), 1.65 mg (03/01/2019), 1.65 mg (03/22/2019), 1.65 mg (04/12/2019)  for chemotherapy treatment.    11/17/2018 - 12/03/2018 Radiation Therapy   Palliative radiation to the T5 location of the spine and right femur, 35 Gy in 14 fractions   04/05/2019 Miscellaneous   Guardant 360:PIK3CA mutation (alpelisib), NF1 S2 467 (everolimus) ARID1A S1791 (response to neratinib, olaparib, rucaparib, talazoparib), T p53 and SMAD4 Q534, TMB 35.41 mutations /MB, no evidence of MSI high   08/30/2019 -  Chemotherapy   The patient had pembrolizumab (KEYTRUDA) 200 mg in sodium chloride 0.9 % 50 mL chemo infusion, 200 mg, Intravenous, Once, 0 of 6 cycles  for chemotherapy treatment.        REVIEW OF SYSTEMS:   Constitutional: Denies fevers, chills  Respiratory: Has mild shortness of breath, no cough Cardiovascular: Denies palpitation, chest discomfort Gastrointestinal:  Denies nausea, heartburn or change in bowel habits Skin: Denies abnormal skin rashes Lymphatics: Denies new lymphadenopathy or easy bruising Neurological: Reports dizziness and headaches which are now improved with dexamethasone. Behavioral/Psych: Mood is stable, no new changes  Extremities: No lower extremity edema All other systems were reviewed with the patient and are negative.  I have reviewed the past medical history, past surgical history, social history and family history with the patient and they are unchanged from previous note.   PHYSICAL EXAMINATION: ECOG PERFORMANCE STATUS: 3 - Symptomatic, >50% confined to bed  Vitals:   08/30/19 0602 08/30/19 0736  BP: 101/78 118/86  Pulse: 100 (!) 102  Resp: 20 15  Temp: 98.3 F (36.8 C) 97.9 F (36.6 C)  SpO2: 92% 95%   Filed Weights   08/29/19 1459  Weight: 43.5 kg    Intake/Output from previous day: No intake/output data recorded.  GENERAL:alert, no distress and comfortable SKIN: skin color, texture, turgor are normal, no rashes or significant lesions EYES: normal, Conjunctiva are pink and non-injected, sclera clear OROPHARYNX:no exudate, no erythema and lips, buccal mucosa, and tongue normal  LUNGS: clear to auscultation and percussion with normal breathing effort HEART: regular rate & rhythm and no murmurs and no lower extremity edema ABDOMEN:abdomen soft, non-tender and normal bowel sounds NEURO: alert & oriented x 3 with fluent speech, no focal motor/sensory deficits  LABORATORY DATA:  I have reviewed the data as listed CMP Latest Ref Rng & Units 08/30/2019 08/29/2019 08/23/2019  Glucose 70 - 99 mg/dL 151(H) 118(H) 135(H)  BUN 8 - 23 mg/dL _0 Creatinine 0.44 - 1.00 mg/dL 0.80 0.84 1.31(H)  Sodium 135 - 145 mmol/L 139  140 143  Potassium 3.5 - 5.1 mmol/L 3.3(L) 3.1(L) 3.4(L)  Chloride 98 - 111 mmol/L 102 99 102  CO2 22 - 32 mmol/L _1 Calcium 8.9 - 10.3 mg/dL 8.4(L) 9.1 8.5(L)  Total Protein 6.5 - 8.1 g/dL 6.2(L) 6.7 6.3(L)  Total Bilirubin 0.3 - 1.2 mg/dL 0.7 0.7 0.4  Alkaline Phos 38 - 126 U/L 99 97 103  AST 15 - 41 U/L 38 44(H) 28  ALT 0 - 44 U/L _2 Lab Results  Component Value Date   WBC 8.4 08/30/2019   HGB 11.3 (L) 08/30/2019   HCT 34.4 (L) 08/30/2019   MCV 96.6 08/30/2019   PLT 526 (H) 08/30/2019   NEUTROABS 7.3 08/30/2019    CT Head Wo Contrast  Result Date: 08/29/2019 CLINICAL DATA:  Headache, new or worsening, cancer history (Age 57-49y) History of breast cancer, metastatic to bone. EXAM: CT HEAD WITHOUT CONTRAST TECHNIQUE: Contiguous axial images were obtained from the base of the skull through the vertex without intravenous contrast. COMPARISON:  No prior head CT. FINDINGS: Brain: Ill-defined edema in the right posterior frontal anterior parietal lobe at the high convexity suspicious for underlying lesion, series 2, image 25. Possible rounded hyperdense lesion abutting the temporal horn of the right lateral ventricle measuring 8 mm. Small periventricular hyperattenuating focus in the left frontal lobe, series 2, image 19. Questionable edema involving the central cerebellum, right more  prominent than left, series 2 images 9 through 11. Slight crowding of the basilar cisterns. No hemorrhage or extra-axial collection. Vascular: No hyperdense vessel. Skull: No evidence of blastic or destructive bone lesion. Sinuses/Orbits: Paranasal sinuses and mastoid air cells are clear. The visualized orbits are unremarkable. Other: None. IMPRESSION: 1. Findings suspicious for intracranial metastatic disease with ill-defined edema in the right frontoparietal lobe at the high convexity as well as bilateral cerebellum. Possible hyperdense nodule in the right temporal lobe abutting the lateral  ventricle and periventricular left frontal lobe. Recommend MRI without and with IV contrast for further evaluation. 2. There is slight crowding of the basilar cisterns. These results were called by telephone at the time of interpretation on 08/29/2019 at 6:40 pm to [facility] Tyrone Nine , who verbally acknowledged these results. Electronically Signed   By: Keith Rake M.D.   On: 08/29/2019 18:40   CT Chest W Contrast  Result Date: 08/19/2019 CLINICAL DATA:  Metastatic breast cancer.  Restaging. EXAM: CT CHEST, ABDOMEN, AND PELVIS WITH CONTRAST TECHNIQUE: Multidetector CT imaging of the chest, abdomen and pelvis was performed following the standard protocol during bolus administration of intravenous contrast. CONTRAST:  85m OMNIPAQUE IOHEXOL 300 MG/ML  SOLN COMPARISON:  04/30/2019 FINDINGS: CT CHEST FINDINGS Cardiovascular: Normal heart size. Small pericardial effusion is unchanged from the previous exam. Aortic atherosclerosis. Lad coronary artery atherosclerotic calcification. Mediastinum/Nodes: Normal appearance of the thyroid gland. The trachea appears patent and is midline. Normal appearance of the esophagus. Index pericardial lymph node has decreased in size from previous exam measuring 0.5 cm on today's study, image 49/2. On the previous exam this measured 1.4 cm (long axis). Left axillary index lymph node measures 0.8 x 0.7 cm, image 17/2. Previously 1.4 x 1.0 cm. Low-density lymph node posterior to the right bronchus intermedius measures 1.1 cm, image 29/2. Previously 1.3 cm. Lungs/Pleura: Loculated left pleural effusion with pleural thickening and enhancement is unchanged when compared with the previous exam. Extensive architectural distortion and fibrosis with volume loss noted in the left lung. This is unchanged from the previous exam. Paramediastinal consolidation and fibrosis within the right upper lobe is similar to previous exam. There is a new moderate right pleural effusion. Also new is near complete  atelectasis of the right middle lobe, image 65/4. Musculoskeletal: Multifocal sclerotic bone metastases are identified. T4 and T11 compression fractures are unchanged. CT ABDOMEN PELVIS FINDINGS Hepatobiliary: There is a low-attenuation lesion within posterior right hepatic lobe measuring 1.1 cm, image 57/2. This is a new finding when compared with the previous exam. The there are several additional low-density lesions which are less than 1 cm and appear unchanged from the prior exam. Gallbladder unremarkable. No biliary dilatation. Pancreas: Unremarkable. No pancreatic ductal dilatation or surrounding inflammatory changes. Spleen: Normal in size without focal abnormality. Adrenals/Urinary Tract: Bilateral areas of low attenuation involving both upper poles of the kidneys noted. No mass or hydronephrosis identified bilaterally. Stomach/Bowel: Stomach is within normal limits. Appendix appears normal. No evidence of bowel wall thickening, distention, or inflammatory changes. Vascular/Lymphatic: Aortic atherosclerosis. There is a infiltrative soft tissue mass within the left periaortic region at the level of the superior mesenteric artery. This measures 3.4 x 2.0 by 2.5 cm, image 73/5 and image 55/2. This partially encases the celiac trunk and superior mesenteric artery as well as the left adrenal gland. Present on the previous exam this measured 3.2 x 1.6 by 2.8 cm. Reproductive: Uterus and bilateral adnexa are unremarkable. Other: There is trace free fluid identified within the  abdomen and pelvis. Musculoskeletal: Previous left hip arthroplasty. Multifocal sclerotic bone metastases are again noted and appears similar to the previous exam IMPRESSION: 1. New low-attenuation lesion within right lobe of liver, suspicious for metastatic disease. There is also a new moderate right pleural effusion. Consider further evaluation with diagnostic thoracentesis to evaluate for tumor involvement of the right hemithorax. 2.  Similar appearance of loculated left-sided pleural effusion with pleural thickening and enhancement compatible with pleural spread of disease. 3. Interval decrease in size of enhancing lymph nodes within the mediastinum and left axilla. 4. Similar size of left retroperitoneal mass which involves the celiac artery, SMA and left adrenal gland. 5. Unchanged small pericardial effusion 6. Unchanged appearance of multifocal sclerotic bone metastases with pathologic fractures involving T4 and T11 vertebra. 7. Geographic distribution of low attenuation within the upper poles of both kidneys is favored to represent sequelae of external beam radiation. 8. Coronary artery calcifications 9.  Aortic Atherosclerosis (ICD10-I70.0). Electronically Signed   By: Kerby Moors M.D.   On: 08/19/2019 10:50   MR Brain W and Wo Contrast  Result Date: 08/29/2019 CLINICAL DATA:  Headache with vertigo. History of breast carcinoma. EXAM: MRI HEAD WITHOUT AND WITH CONTRAST TECHNIQUE: Multiplanar, multiecho pulse sequences of the brain and surrounding structures were obtained without and with intravenous contrast. CONTRAST:  92m GADAVIST GADOBUTROL 1 MMOL/ML IV SOLN COMPARISON:  Head CT 08/29/2019 FINDINGS: Brain: Areas of vasogenic edema within both cerebellar hemispheres, the right parietal lobe and the brainstem. No abnormal diffusion restriction. There are numerous (approximately 50) contrast-enhancing lesions throughout the brain and cerebellum. Within the cerebellum, there are at least 25 lesions, the largest of which measures 2.5 x 2.0 cm. There are 3 small lesions in the midbrain. Right temporal lobe lesion measures 9 mm. Right basal ganglia lesion measures 7 mm. Superior right parietal lesion measures 11 mm. No chronic microhemorrhage. Mass effect in the cerebellum compresses the fourth ventricle and distal cerebral aqueduct. Vascular: Normal flow voids. Skull and upper cervical spine: Normal marrow signal. Sinuses/Orbits:  Negative. Other: None. IMPRESSION: 1. Numerous (approximately 50) intraparenchymal brain metastases within both hemispheres and most greatly concentrated in the cerebellum. 2. Vasogenic edema is greatest in the cerebellum or there is mass effect on the fourth ventricle and distal cerebral aqueduct. Electronically Signed   By: KUlyses JarredM.D.   On: 08/29/2019 19:54   CT Abdomen Pelvis W Contrast  Result Date: 08/19/2019 CLINICAL DATA:  Metastatic breast cancer.  Restaging. EXAM: CT CHEST, ABDOMEN, AND PELVIS WITH CONTRAST TECHNIQUE: Multidetector CT imaging of the chest, abdomen and pelvis was performed following the standard protocol during bolus administration of intravenous contrast. CONTRAST:  726mOMNIPAQUE IOHEXOL 300 MG/ML  SOLN COMPARISON:  04/30/2019 FINDINGS: CT CHEST FINDINGS Cardiovascular: Normal heart size. Small pericardial effusion is unchanged from the previous exam. Aortic atherosclerosis. Lad coronary artery atherosclerotic calcification. Mediastinum/Nodes: Normal appearance of the thyroid gland. The trachea appears patent and is midline. Normal appearance of the esophagus. Index pericardial lymph node has decreased in size from previous exam measuring 0.5 cm on today's study, image 49/2. On the previous exam this measured 1.4 cm (long axis). Left axillary index lymph node measures 0.8 x 0.7 cm, image 17/2. Previously 1.4 x 1.0 cm. Low-density lymph node posterior to the right bronchus intermedius measures 1.1 cm, image 29/2. Previously 1.3 cm. Lungs/Pleura: Loculated left pleural effusion with pleural thickening and enhancement is unchanged when compared with the previous exam. Extensive architectural distortion and fibrosis with volume loss noted in  the left lung. This is unchanged from the previous exam. Paramediastinal consolidation and fibrosis within the right upper lobe is similar to previous exam. There is a new moderate right pleural effusion. Also new is near complete atelectasis of  the right middle lobe, image 65/4. Musculoskeletal: Multifocal sclerotic bone metastases are identified. T4 and T11 compression fractures are unchanged. CT ABDOMEN PELVIS FINDINGS Hepatobiliary: There is a low-attenuation lesion within posterior right hepatic lobe measuring 1.1 cm, image 57/2. This is a new finding when compared with the previous exam. The there are several additional low-density lesions which are less than 1 cm and appear unchanged from the prior exam. Gallbladder unremarkable. No biliary dilatation. Pancreas: Unremarkable. No pancreatic ductal dilatation or surrounding inflammatory changes. Spleen: Normal in size without focal abnormality. Adrenals/Urinary Tract: Bilateral areas of low attenuation involving both upper poles of the kidneys noted. No mass or hydronephrosis identified bilaterally. Stomach/Bowel: Stomach is within normal limits. Appendix appears normal. No evidence of bowel wall thickening, distention, or inflammatory changes. Vascular/Lymphatic: Aortic atherosclerosis. There is a infiltrative soft tissue mass within the left periaortic region at the level of the superior mesenteric artery. This measures 3.4 x 2.0 by 2.5 cm, image 73/5 and image 55/2. This partially encases the celiac trunk and superior mesenteric artery as well as the left adrenal gland. Present on the previous exam this measured 3.2 x 1.6 by 2.8 cm. Reproductive: Uterus and bilateral adnexa are unremarkable. Other: There is trace free fluid identified within the abdomen and pelvis. Musculoskeletal: Previous left hip arthroplasty. Multifocal sclerotic bone metastases are again noted and appears similar to the previous exam IMPRESSION: 1. New low-attenuation lesion within right lobe of liver, suspicious for metastatic disease. There is also a new moderate right pleural effusion. Consider further evaluation with diagnostic thoracentesis to evaluate for tumor involvement of the right hemithorax. 2. Similar appearance of  loculated left-sided pleural effusion with pleural thickening and enhancement compatible with pleural spread of disease. 3. Interval decrease in size of enhancing lymph nodes within the mediastinum and left axilla. 4. Similar size of left retroperitoneal mass which involves the celiac artery, SMA and left adrenal gland. 5. Unchanged small pericardial effusion 6. Unchanged appearance of multifocal sclerotic bone metastases with pathologic fractures involving T4 and T11 vertebra. 7. Geographic distribution of low attenuation within the upper poles of both kidneys is favored to represent sequelae of external beam radiation. 8. Coronary artery calcifications 9.  Aortic Atherosclerosis (ICD10-I70.0). Electronically Signed   By: Kerby Moors M.D.   On: 08/19/2019 10:50    ASSESSMENT AND PLAN: 1.  Metastatic breast cancer 2.  New brain metastases 3.  Right pleural effusion 4.  Mild anemia 5.  Hypokalemia  -MRI results have been discussed with the patient.  I have notified radiation oncology (Dr. Lisbeth Renshaw) of request for consult. -Continue dexamethasone. -The patient was scheduled for an outpatient thoracentesis this Wednesday.  Per her request, I have placed an order for IR to perform the thoracentesis while she is inpatient. -Monitor CBC closely.  Transfuse for hemoglobin less than 7. -Replete potassium per hospitalist.   LOS: 1 day   Mikey Bussing, DNP, AGPCNP-BC, AOCNP 08/30/19

## 2019-08-30 NOTE — Consult Note (Signed)
Radiation Oncology         [phone] ________________________________  Name: Elizabeth [name]        MRN: [id number]  Date of Service:  08/30/19 DOB: 13-Dec-1955  [id number], [name], [place]  No ref. provider found     REFERRING PHYSICIAN: No ref. provider found   DIAGNOSIS: The primary encounter diagnosis was Metastasis from breast cancer ([place]). Diagnoses of Vasogenic brain edema ([facility]) and Pleural effusion were also pertinent to this visit.   HISTORY OF PRESENT ILLNESS: Elizabeth [name] is a 64 y.o. female seen at the request of Dr.Gudena and Mikey Bussing, NP. Elizabeth [name] is known to our service from her prior history of metastatic breast cancer. Unfortunately she started feeling quite dizzy and presented the ED yesterday.  A CT of the head revealed concerns for intracranial metastatic disease with edema in the right frontoparietal region as well as the cerebellum. There was slight crowding of the basal cisterns. An MRI with and without contrast was performed which revealed areas of vasogenic edema in both cerebral hemispheres, right parietal lobe and brainstem.  Numerous metastatic lesions approximately 50 in total were seen throughout the brain and cerebellum.  There were at least 25 lesions in the cerebellum the largest of which measured 2.5 x 2 cm and 3 smaller in the midbrain.  There was mass-effect in the cerebellum compressing the fourth ventricle and distal cerebral aqueduct.  Given these findings she was started on steroids at 10 mg every 8 hours, oncology has seen her this morning and requested that we consider moving forward with radiation treatment.  She was just recently found to have new disease in the lung and associated pleural effusion and was going to be starting a new systemic therapy this week which will be temporarily put on hold until we finished radiotherapy.    PREVIOUS RADIATION THERAPY: Yes   11/16/2018-12/03/18: The right femur, lumbar spine and thoracic spine were treated to  35 Gy in 14 fractions.  12/20/17-12/26/17: The patient received 30 Gy in 10 fractions to the T11 vertebral body   PAST MEDICAL HISTORY:  Past Medical History:  Diagnosis Date  . Arthritis   . Breast cancer ([place]) 2010   Left  . Chronic sinusitis   . Family history of adverse reaction to anesthesia    sister-nausea/vomiting  . History of chemotherapy 01/2009-03/2009  . Hypercholesteremia   . Hypothyroid    "inactive thyroid"  . Metastasis ([place]) 11/2017   LN  . Metastatic cancer to bone ([place]) 11/2017  . Migraines   . RSD (reflex sympathetic dystrophy)   . Vertigo        PAST SURGICAL HISTORY: Past Surgical History:  Procedure Laterality Date  . BREAST RECONSTRUCTION    . EYE SURGERY Bilateral 7/18, 10/18  . IR IMAGING GUIDED PORT INSERTION  10/28/2018  . IR INSTILL VIA CHEST TUBE AGENT FOR FIBRINOLYSIS INI DAY  10/09/2018  . IR PATIENT EVAL TECH 0-60 MINS  09/08/2018  . IR PERC PLEURAL DRAIN W/INDWELL CATH W/IMG GUIDE  08/05/2018  . IR RADIOLOGIST EVAL & MGMT  10/12/2018  . IR REMOVAL OF PLURAL CATH W/CUFF  10/26/2018  . KNEE ARTHROSCOPY Bilateral   . KNEE SURGERY Right    Two surgeries  . MASTECTOMY Bilateral 05/22/2009  . MASTECTOMY Bilateral 2011   restrictions on left arm-no labs/bloodpressure  . NASAL SINUS SURGERY    . PORTA CATH INSERTION  12/2008  . PORTA CATH REMOVAL  05/2009  . ROOT CANAL    .  SHOULDER SURGERY Right   . TOTAL HIP ARTHROPLASTY Left 05/26/2017   Procedure: TOTAL HIP ARTHROPLASTY ANTERIOR APPROACH;  Surgeon: Frederik Pear, MD;  Location: [facility];  Service: Orthopedics;  Laterality: Left;     FAMILY HISTORY:  Family History  Problem Relation Age of Onset  . Hypertension Mother      SOCIAL HISTORY:  reports that she has never smoked. She has never used smokeless tobacco. She reports that she does not drink alcohol or use drugs.  The patient is married and lives in [place].  ALLERGIES: Shellfish allergy and Sulfa  antibiotics   MEDICATIONS:  Current Facility-Administered Medications  Medication Dose Route Frequency Provider Last Rate Last Admin  . Chlorhexidine Gluconate Cloth 2 % PADS 6 each  6 each Topical Daily Rise Patience, MD   6 each at 08/30/19 [phone]  . dexamethasone (DECADRON) injection 8 mg  8 mg Intravenous TID Rise Patience, MD   8 mg at 08/30/19 0934  . gabapentin (NEURONTIN) capsule 300 mg  300 mg Oral BID Rise Patience, MD   300 mg at 08/30/19 0934  . gabapentin (NEURONTIN) capsule 300 mg  300 mg Oral Once Merlene Laughter F, NP      . levothyroxine (SYNTHROID) tablet 25 mcg  25 mcg Oral QAC breakfast Rise Patience, MD   25 mcg at 08/30/19 0456  . LORazepam (ATIVAN) tablet 1 mg  1 mg Oral Daily PRN Rise Patience, MD      . ondansetron [facility]) tablet 4 mg  4 mg Oral Q6H PRN Rise Patience, MD       Or  . ondansetron [facility]) injection 4 mg  4 mg Intravenous Q6H PRN Rise Patience, MD      . oxyCODONE-acetaminophen (PERCOCET/ROXICET) 5-325 MG per tablet 1 tablet  1 tablet Oral Q8H PRN Rise Patience, MD   1 tablet at 08/30/19 1112   And  . oxyCODONE (Oxy IR/ROXICODONE) immediate release tablet 5 mg  5 mg Oral Q8H PRN Rise Patience, MD   5 mg at 08/30/19 1111  . potassium chloride SA (KLOR-CON) CR tablet 40 mEq  40 mEq Oral Once [place], Marge Duncans, MD      . prednisoLONE acetate (PRED FORTE) 1 % ophthalmic suspension 1 drop  1 drop Both Eyes Daily Rise Patience, MD   1 drop at 08/30/19 1112  . prochlorperazine (COMPAZINE) tablet 10 mg  10 mg Oral Q6H PRN Rise Patience, MD      . sodium chloride flush (NS) 0.9 % injection 10-40 mL  10-40 mL Intracatheter PRN Rise Patience, MD      . traMADol Veatrice Bourbon) tablet 50 mg  50 mg Oral Q6H PRN Rise Patience, MD         REVIEW OF SYSTEMS: On review of systems, the patient reports that she is doing okay.  She states that she is still quite dizzy and feels very unsteady  when trying to walk.  She has had episodes of claustrophobia as well when trying to have her MRI scan.  Shedenies any chest pain, she has mild shortness of breath, cough, fevers, chills, night sweats, unintended weight changes. She denies any bowel or bladder disturbances, and denies abdominal pain, nausea or vomiting. She  denies any new musculoskeletal or joint aches or pains. A complete review of systems is obtained and is otherwise negative.     PHYSICAL EXAM:  Wt Readings from Last 3 Encounters:  08/29/19  96 lb (43.5 kg)  08/23/19 96 lb 9.6 oz (43.8 kg)  07/26/19 92 lb 4.8 oz (41.9 kg)   Temp Readings from Last 3 Encounters:  08/30/19 97.9 F (36.6 C) (Oral)  08/23/19 98.5 F (36.9 C) (Temporal)  07/26/19 98.5 F (36.9 C) (Temporal)   BP Readings from Last 3 Encounters:  08/30/19 118/86  08/23/19 119/68  07/26/19 107/89   Pulse Readings from Last 3 Encounters:  08/30/19 (!) 102  08/23/19 97  07/26/19 93   Pain Assessment Pain Score: Asleep/10  Unable to assess due to encounter type.   ECOG = 3  0 - Asymptomatic (Fully active, able to carry on all predisease activities without restriction)  1 - Symptomatic but completely ambulatory (Restricted in physically strenuous activity but ambulatory and able to carry out work of a light or sedentary nature. For example, light housework, office work)  2 - Symptomatic, <50% in bed during the day (Ambulatory and capable of all self care but unable to carry out any work activities. Up and about more than 50% of waking hours)  3 - Symptomatic, >50% in bed, but not bedbound (Capable of only limited self-care, confined to bed or chair 50% or more of waking hours)  4 - Bedbound (Completely disabled. Cannot carry on any self-care. Totally confined to bed or chair)  5 - Death   Eustace Pen MM, Creech RH, Tormey DC, et al. [phone]). "Toxicity and response criteria of the [facility]". [place] Oncol. 5 (6):  649-55    LABORATORY DATA:  Lab Results  Component Value Date   WBC 8.4 08/30/2019   HGB 11.3 (L) 08/30/2019   HCT 34.4 (L) 08/30/2019   MCV 96.6 08/30/2019   PLT 526 (H) 08/30/2019   Lab Results  Component Value Date   NA 139 08/30/2019   K 3.3 (L) 08/30/2019   CL 102 08/30/2019   CO2 26 08/30/2019   Lab Results  Component Value Date   ALT 18 08/30/2019   AST 38 08/30/2019   ALKPHOS 99 08/30/2019   BILITOT 0.7 08/30/2019      RADIOGRAPHY: CT Head Wo Contrast  Result Date: 08/29/2019 CLINICAL DATA:  Headache, new or worsening, cancer history (Age 43-49y) History of breast cancer, metastatic to bone. EXAM: CT HEAD WITHOUT CONTRAST TECHNIQUE: Contiguous axial images were obtained from the base of the skull through the vertex without intravenous contrast. COMPARISON:  No prior head CT. FINDINGS: Brain: Ill-defined edema in the right posterior frontal anterior parietal lobe at the high convexity suspicious for underlying lesion, series 2, image 25. Possible rounded hyperdense lesion abutting the temporal horn of the right lateral ventricle measuring 8 mm. Small periventricular hyperattenuating focus in the left frontal lobe, series 2, image 19. Questionable edema involving the central cerebellum, right more prominent than left, series 2 images 9 through 11. Slight crowding of the basilar cisterns. No hemorrhage or extra-axial collection. Vascular: No hyperdense vessel. Skull: No evidence of blastic or destructive bone lesion. Sinuses/Orbits: Paranasal sinuses and mastoid air cells are clear. The visualized orbits are unremarkable. Other: None. IMPRESSION: 1. Findings suspicious for intracranial metastatic disease with ill-defined edema in the right frontoparietal lobe at the high convexity as well as bilateral cerebellum. Possible hyperdense nodule in the right temporal lobe abutting the lateral ventricle and periventricular left frontal lobe. Recommend MRI without and with IV contrast for  further evaluation. 2. There is slight crowding of the basilar cisterns. These results were called by telephone at the time  of interpretation on 08/29/2019 at 6:40 pm to Dr [facility] , who verbally acknowledged these results. Electronically Signed   By: Keith Rake M.D.   On: 08/29/2019 18:40   CT Chest W Contrast  Result Date: 08/19/2019 CLINICAL DATA:  Metastatic breast cancer.  Restaging. EXAM: CT CHEST, ABDOMEN, AND PELVIS WITH CONTRAST TECHNIQUE: Multidetector CT imaging of the chest, abdomen and pelvis was performed following the standard protocol during bolus administration of intravenous contrast. CONTRAST:  56mL OMNIPAQUE IOHEXOL 300 MG/ML  SOLN COMPARISON:  04/30/2019 FINDINGS: CT CHEST FINDINGS Cardiovascular: Normal heart size. Small pericardial effusion is unchanged from the previous exam. Aortic atherosclerosis. Lad coronary artery atherosclerotic calcification. Mediastinum/Nodes: Normal appearance of the thyroid gland. The trachea appears patent and is midline. Normal appearance of the esophagus. Index pericardial lymph node has decreased in size from previous exam measuring 0.5 cm on today's study, image 49/2. On the previous exam this measured 1.4 cm (long axis). Left axillary index lymph node measures 0.8 x 0.7 cm, image 17/2. Previously 1.4 x 1.0 cm. Low-density lymph node posterior to the right bronchus intermedius measures 1.1 cm, image 29/2. Previously 1.3 cm. Lungs/Pleura: Loculated left pleural effusion with pleural thickening and enhancement is unchanged when compared with the previous exam. Extensive architectural distortion and fibrosis with volume loss noted in the left lung. This is unchanged from the previous exam. Paramediastinal consolidation and fibrosis within the right upper lobe is similar to previous exam. There is a new moderate right pleural effusion. Also new is near complete atelectasis of the right middle lobe, image 65/4. Musculoskeletal: Multifocal sclerotic bone  metastases are identified. T4 and T11 compression fractures are unchanged. CT ABDOMEN PELVIS FINDINGS Hepatobiliary: There is a low-attenuation lesion within posterior right hepatic lobe measuring 1.1 cm, image 57/2. This is a new finding when compared with the previous exam. The there are several additional low-density lesions which are less than 1 cm and appear unchanged from the prior exam. Gallbladder unremarkable. No biliary dilatation. Pancreas: Unremarkable. No pancreatic ductal dilatation or surrounding inflammatory changes. Spleen: Normal in size without focal abnormality. Adrenals/Urinary Tract: Bilateral areas of low attenuation involving both upper poles of the kidneys noted. No mass or hydronephrosis identified bilaterally. Stomach/Bowel: Stomach is within normal limits. Appendix appears normal. No evidence of bowel wall thickening, distention, or inflammatory changes. Vascular/Lymphatic: Aortic atherosclerosis. There is a infiltrative soft tissue mass within the left periaortic region at the level of the superior mesenteric artery. This measures 3.4 x 2.0 by 2.5 cm, image 73/5 and image 55/2. This partially encases the celiac trunk and superior mesenteric artery as well as the left adrenal gland. Present on the previous exam this measured 3.2 x 1.6 by 2.8 cm. Reproductive: Uterus and bilateral adnexa are unremarkable. Other: There is trace free fluid identified within the abdomen and pelvis. Musculoskeletal: Previous left hip arthroplasty. Multifocal sclerotic bone metastases are again noted and appears similar to the previous exam IMPRESSION: 1. New low-attenuation lesion within right lobe of liver, suspicious for metastatic disease. There is also a new moderate right pleural effusion. Consider further evaluation with diagnostic thoracentesis to evaluate for tumor involvement of the right hemithorax. 2. Similar appearance of loculated left-sided pleural effusion with pleural thickening and enhancement  compatible with pleural spread of disease. 3. Interval decrease in size of enhancing lymph nodes within the mediastinum and left axilla. 4. Similar size of left retroperitoneal mass which involves the celiac artery, SMA and left adrenal gland. 5. Unchanged small pericardial effusion 6. Unchanged appearance  of multifocal sclerotic bone metastases with pathologic fractures involving T4 and T11 vertebra. 7. Geographic distribution of low attenuation within the upper poles of both kidneys is favored to represent sequelae of external beam radiation. 8. Coronary artery calcifications 9.  Aortic Atherosclerosis (ICD10-I70.0). Electronically Signed   By: Kerby Moors M.D.   On: 08/19/2019 10:50   MR Brain W and Wo Contrast  Result Date: 08/29/2019 CLINICAL DATA:  Headache with vertigo. History of breast carcinoma. EXAM: MRI HEAD WITHOUT AND WITH CONTRAST TECHNIQUE: Multiplanar, multiecho pulse sequences of the brain and surrounding structures were obtained without and with intravenous contrast. CONTRAST:  98mL GADAVIST GADOBUTROL 1 MMOL/ML IV SOLN COMPARISON:  Head CT 08/29/2019 FINDINGS: Brain: Areas of vasogenic edema within both cerebellar hemispheres, the right parietal lobe and the brainstem. No abnormal diffusion restriction. There are numerous (approximately 50) contrast-enhancing lesions throughout the brain and cerebellum. Within the cerebellum, there are at least 25 lesions, the largest of which measures 2.5 x 2.0 cm. There are 3 small lesions in the midbrain. Right temporal lobe lesion measures 9 mm. Right basal ganglia lesion measures 7 mm. Superior right parietal lesion measures 11 mm. No chronic microhemorrhage. Mass effect in the cerebellum compresses the fourth ventricle and distal cerebral aqueduct. Vascular: Normal flow voids. Skull and upper cervical spine: Normal marrow signal. Sinuses/Orbits: Negative. Other: None. IMPRESSION: 1. Numerous (approximately 50) intraparenchymal brain metastases within  both hemispheres and most greatly concentrated in the cerebellum. 2. Vasogenic edema is greatest in the cerebellum or there is mass effect on the fourth ventricle and distal cerebral aqueduct. Electronically Signed   By: Ulyses Jarred M.D.   On: 08/29/2019 19:54   CT Abdomen Pelvis W Contrast  Result Date: 08/19/2019 CLINICAL DATA:  Metastatic breast cancer.  Restaging. EXAM: CT CHEST, ABDOMEN, AND PELVIS WITH CONTRAST TECHNIQUE: Multidetector CT imaging of the chest, abdomen and pelvis was performed following the standard protocol during bolus administration of intravenous contrast. CONTRAST:  43mL OMNIPAQUE IOHEXOL 300 MG/ML  SOLN COMPARISON:  04/30/2019 FINDINGS: CT CHEST FINDINGS Cardiovascular: Normal heart size. Small pericardial effusion is unchanged from the previous exam. Aortic atherosclerosis. Lad coronary artery atherosclerotic calcification. Mediastinum/Nodes: Normal appearance of the thyroid gland. The trachea appears patent and is midline. Normal appearance of the esophagus. Index pericardial lymph node has decreased in size from previous exam measuring 0.5 cm on today's study, image 49/2. On the previous exam this measured 1.4 cm (long axis). Left axillary index lymph node measures 0.8 x 0.7 cm, image 17/2. Previously 1.4 x 1.0 cm. Low-density lymph node posterior to the right bronchus intermedius measures 1.1 cm, image 29/2. Previously 1.3 cm. Lungs/Pleura: Loculated left pleural effusion with pleural thickening and enhancement is unchanged when compared with the previous exam. Extensive architectural distortion and fibrosis with volume loss noted in the left lung. This is unchanged from the previous exam. Paramediastinal consolidation and fibrosis within the right upper lobe is similar to previous exam. There is a new moderate right pleural effusion. Also new is near complete atelectasis of the right middle lobe, image 65/4. Musculoskeletal: Multifocal sclerotic bone metastases are identified.  T4 and T11 compression fractures are unchanged. CT ABDOMEN PELVIS FINDINGS Hepatobiliary: There is a low-attenuation lesion within posterior right hepatic lobe measuring 1.1 cm, image 57/2. This is a new finding when compared with the previous exam. The there are several additional low-density lesions which are less than 1 cm and appear unchanged from the prior exam. Gallbladder unremarkable. No biliary dilatation. Pancreas: Unremarkable. No pancreatic  ductal dilatation or surrounding inflammatory changes. Spleen: Normal in size without focal abnormality. Adrenals/Urinary Tract: Bilateral areas of low attenuation involving both upper poles of the kidneys noted. No mass or hydronephrosis identified bilaterally. Stomach/Bowel: Stomach is within normal limits. Appendix appears normal. No evidence of bowel wall thickening, distention, or inflammatory changes. Vascular/Lymphatic: Aortic atherosclerosis. There is a infiltrative soft tissue mass within the left periaortic region at the level of the superior mesenteric artery. This measures 3.4 x 2.0 by 2.5 cm, image 73/5 and image 55/2. This partially encases the celiac trunk and superior mesenteric artery as well as the left adrenal gland. Present on the previous exam this measured 3.2 x 1.6 by 2.8 cm. Reproductive: Uterus and bilateral adnexa are unremarkable. Other: There is trace free fluid identified within the abdomen and pelvis. Musculoskeletal: Previous left hip arthroplasty. Multifocal sclerotic bone metastases are again noted and appears similar to the previous exam IMPRESSION: 1. New low-attenuation lesion within right lobe of liver, suspicious for metastatic disease. There is also a new moderate right pleural effusion. Consider further evaluation with diagnostic thoracentesis to evaluate for tumor involvement of the right hemithorax. 2. Similar appearance of loculated left-sided pleural effusion with pleural thickening and enhancement compatible with pleural  spread of disease. 3. Interval decrease in size of enhancing lymph nodes within the mediastinum and left axilla. 4. Similar size of left retroperitoneal mass which involves the celiac artery, SMA and left adrenal gland. 5. Unchanged small pericardial effusion 6. Unchanged appearance of multifocal sclerotic bone metastases with pathologic fractures involving T4 and T11 vertebra. 7. Geographic distribution of low attenuation within the upper poles of both kidneys is favored to represent sequelae of external beam radiation. 8. Coronary artery calcifications 9.  Aortic Atherosclerosis (ICD10-I70.0). Electronically Signed   By: Kerby Moors M.D.   On: 08/19/2019 10:50       IMPRESSION/PLAN: 1. Metastatic breast cancer with lung and pleural-based disease as well as greater than 50 brain metastases.  I spent time with the patient this morning by phone reviewing the findings that had been seen on her MRI scan.  I will also review this when her husband is present at the time of simulation.  We discussed the rationale for brain radiotherapy in this circumstance, and proceeding with systemic therapy when we complete radiotherapy under the care of Dr. Lindi Adie.  We discussed whole brain radiation given the number of lesions and distribution of her disease.  Dr. Lisbeth Renshaw will review her case as well but I suspect he would recommend a course of 10 fractions with the possibility at a later time of salvage radiosurgery if needed to the largest lesion in the cerebellum.  We discussed the risks, benefits, short and long-term effects of radiotherapy and the patient is interested in proceeding.  She will come to simulation in our department later today around 2:30 pm.  At that time she will sign consent to proceed. I also suspect that we can make some adjustments in her steroids perhaps starting tomorrow given the bulkiness of her disease. 2. Claustrophobia.  I have contacted the patient's nurse and requested that a dose of Ativan  be given to her 30 minutes prior to coming to our department for simulation.  In a visit lasting 45 minutes, greater than 50% of the time was spent by phone and in floor time discussing the patient's condition, in preparation for the discussion, and coordinating the patient's care.     Carola Rhine, PAC

## 2019-08-31 ENCOUNTER — Ambulatory Visit
Admit: 2019-08-31 | Discharge: 2019-08-31 | Disposition: A | Discharge status: Home / Self Care | Payer: Medicare Other | Attending: Radiation Oncology | Admitting: Radiation Oncology

## 2019-08-31 DIAGNOSIS — Z9889 Other specified postprocedural states: Secondary | ICD-10-CM

## 2019-08-31 LAB — CBC
HCT: 28.5 % — ABNORMAL LOW (ref 36.0–46.0)
Hemoglobin: 9.2 g/dL — ABNORMAL LOW (ref 12.0–15.0)
MCH: 31.2 pg (ref 26.0–34.0)
MCHC: 32.3 g/dL (ref 30.0–36.0)
MCV: 96.6 fL (ref 80.0–100.0)
Platelets: 463 10*3/uL — ABNORMAL HIGH (ref 150–400)
RBC: 2.95 MIL/uL — ABNORMAL LOW (ref 3.87–5.11)
RDW: 15.3 % (ref 11.5–15.5)
WBC: 9.8 10*3/uL (ref 4.0–10.5)
nRBC: 0 % (ref 0.0–0.2)

## 2019-08-31 LAB — BASIC METABOLIC PANEL
Anion gap: 7 (ref 5–15)
BUN: 16 mg/dL (ref 8–23)
CO2: 29 mmol/L (ref 22–32)
Calcium: 8.5 mg/dL — ABNORMAL LOW (ref 8.9–10.3)
Chloride: 102 mmol/L (ref 98–111)
Creatinine, Ser: 0.88 mg/dL (ref 0.44–1.00)
GFR calc Af Amer: >60 mL/min (ref >60)
GFR calc non Af Amer: >60 mL/min (ref >60)
Glucose, Bld: 113 mg/dL — ABNORMAL HIGH (ref 70–99)
Potassium: 4.3 mmol/L (ref 3.5–5.1)
Sodium: 138 mmol/L (ref 135–145)

## 2019-08-31 LAB — CYTOLOGY - NON PAP

## 2019-08-31 NOTE — Progress Notes (Signed)
[facility]  PROGRESS NOTE  Elizabeth [name] [id number] DOB: 1956/03/11 DOA: 08/29/2019 PCP: [name], PA   Brief HPI:   64 year old female with history of metastatic breast cancer, hypothyroidism who has been experiencing dizziness for past 3 to 4 days with frequent falls.  Patient presented to the hospital CT head showed metastatic lesions and MRI brain showed multiple metastatic lesions mostly in the cerebellum with mass-effect.  Patient was started on IV Decadron.  Oncology was consulted.    Subjective   Patient seen and examined, still has some dizziness.  Plan for whole brain radiation for brain mets today.   Assessment/Plan:     1. Metastatic brain lesion-patient has vasogenic edema with mass-effect seen on the MRI brain.  Started on IV Decadron 8 mg 3 times daily.  Patient was seen by radiation oncology.  Plan for whole brain radiation today.  Patient will need at least 10 treatments and later radiosurgery for large lesion in cerebellum and systemic chemotherapy.  Dose of Decadron has been changed to 4 mg 3 times daily.  2. Hypokalemia-replete 3. Hypothyroidism-continue Synthroid 4. Left pleural effusion-patient has chronic left-sided loculated pleural effusion.  S/p thoracentesis yesterday. 5. Anemia of chronic disease-likely from underlying  malignancy, follow CBC in a.m. 6. Metastatic breast cancer-patient on chemotherapy per oncology.    SpO2: 100 %   COVID-19 Labs  No results for input(s): DDIMER, FERRITIN, LDH, CRP in the last 72 hours.  Lab Results  Component Value Date   SARSCOV2NAA NEGATIVE 08/29/2019   SARSCOV2NAA NEGATIVE 10/26/2018   [place] NEGATIVE 10/23/2018   [place] NEGATIVE 10/09/2018     CBG: No results for input(s): GLUCAP in the last 168 hours.  CBC: Recent Labs  Lab 08/29/19 1745 08/30/19 0100 08/31/19 0330  WBC 6.2 8.4 9.8  NEUTROABS 4.5 7.3  --   HGB 11.3* 11.3* 9.2*  HCT 34.7* 34.4* 28.5*  MCV 96.7 96.6 96.6   PLT 518* 526* 463*    Basic Metabolic Panel: Recent Labs  Lab 08/29/19 1745 08/30/19 0100 08/31/19 0330  NA 140 139 138  K 3.1* 3.3* 4.3  CL 99 102 102  CO2 30 26 29   GLUCOSE 118* 151* 113*  BUN 9 9 16   CREATININE 0.84 0.80 0.88  CALCIUM 9.1 8.4* 8.5*     Liver Function Tests: Recent Labs  Lab 08/29/19 1745 08/30/19 0100  AST 44* 38  ALT 19 18  ALKPHOS 97 99  BILITOT 0.7 0.7  PROT 6.7 6.2*  ALBUMIN 3.3* 3.1*        DVT prophylaxis: SCDs  Code Status: Full code  Family Communication: No family at bedside  Disposition Plan:   Status is: Inpatient  Dispo: The patient is from: Home              Anticipated d/c is to: Home              Anticipated d/c date is: 09/01/2019              Patient currently admitted with brain metastasis, has dizziness, vasogenic edema.  She is on IV Decadron.  Barrier to discharge-we will need radiation treatment for radiation oncology.        Scheduled medications:  . Chlorhexidine Gluconate Cloth  6 each Topical Daily  . dexamethasone  4 mg Oral Q8H  . gabapentin  300 mg Oral BID  . gabapentin  300 mg Oral Once  . levothyroxine  25 mcg Oral QAC breakfast  . prednisoLONE acetate  1 drop Both Eyes Daily    Consultants:  Oncology  Procedures:    Antibiotics:   Anti-infectives (From admission, onward)   None       Objective   Vitals:   08/30/19 1238 08/30/19 1400 08/30/19 1959 08/31/19 0406  BP: 102/86 124/81 121/87 121/80  Pulse:  92 99 93  Resp:  16 16 17   Temp:  97.8 F (36.6 C) 98.3 F (36.8 C) 98.2 F (36.8 C)  TempSrc:  Oral Oral Oral  SpO2:  100% 100% 100%  Weight:      Height:        Intake/Output Summary (Last 24 hours) at 08/31/2019 1227 Last data filed at 08/30/2019 2015 Gross per 24 hour  Intake 240 ml  Output --  Net 240 ml    05/16 1901 - 05/18 0700 In: 240 [P.O.:240] Out: -   Filed Weights   08/29/19 1459  Weight: 43.5 kg    Physical  Examination:   General-appears in no acute distress  Heart-S1-S2, regular, no murmur auscultated  Lungs-clear to auscultation bilaterally, no wheezing or crackles auscultated  Abdomen-soft, nontender, no organomegaly  Extremities-no edema in the lower extremities  Neuro-alert, oriented x3, no focal deficit noted    Data Reviewed:   Recent Results (from the past 240 hour(s))  SARS Coronavirus 2 by RT PCR (hospital order, performed in [place] hospital lab) Nasopharyngeal Nasopharyngeal Swab     Status: None   Collection Time: 08/29/19  8:34 PM   Specimen: Nasopharyngeal Swab  Result Value Ref Range Status   SARS Coronavirus 2 NEGATIVE NEGATIVE Final    Comment: (NOTE) SARS-CoV-2 target nucleic acids are NOT DETECTED. The SARS-CoV-2 RNA is generally detectable in upper and lower respiratory specimens during the acute phase of infection. The lowest concentration of SARS-CoV-2 viral copies this assay can detect is 250 copies / mL. A negative result does not preclude SARS-CoV-2 infection and should not be used as the sole basis for treatment or other patient management decisions.  A negative result may occur with improper specimen collection / handling, submission of specimen other than nasopharyngeal swab, presence of viral mutation(s) within the areas targeted by this assay, and inadequate number of viral copies (<250 copies / mL). A negative result must be combined with clinical observations, patient history, and epidemiological information. Fact Sheet for Patients:   StrictlyIdeas.no Fact Sheet for Healthcare Providers: BankingDealers.co.za This test is not yet approved or cleared  by the [place] FDA and has been authorized for detection and/or diagnosis of SARS-CoV-2 by FDA under an Emergency Use Authorization (EUA).  This EUA will remain in effect (meaning this test can be used) for the duration of the COVID-19  declaration under Section 564(b)(1) of the Act, 21 U.S.C. section 360bbb-3(b)(1), unless the authorization is terminated or revoked sooner. Performed at [facility], [place] [address], [place], [place] [address]     No results for input(s): LIPASE, AMYLASE in the last 168 hours. No results for input(s): AMMONIA in the last 168 hours.  Cardiac Enzymes: No results for input(s): CKTOTAL, CKMB, CKMBINDEX, TROPONINI in the last 168 hours. BNP (last 3 results) No results for input(s): BNP in the last 8760 hours.  ProBNP (last 3 results) No results for input(s): PROBNP in the last 8760 hours.  Studies:  DG Chest 1 View  Result Date: 08/30/2019 CLINICAL DATA:  Patient status post right thoracentesis. EXAM: CHEST  1 VIEW COMPARISON:  Chest radiograph 06/18/2019. FINDINGS: Right anterior chest wall Port-A-Cath  is present with tip projecting over the superior vena cava. Stable enlarged cardiac and mediastinal contours. Similar-appearing consolidation within the left mid upper lung. Small left pleural effusion. Minimal right basilar atelectasis. No right-sided pneumothorax identified. Skin fold projects over the right thorax. IMPRESSION: Persistent small left pleural effusion. Similar-appearing patchy opacities within the left lung. Electronically Signed   By: Lovey Newcomer M.D.   On: 08/30/2019 12:59   CT Head Wo Contrast  Result Date: 08/29/2019 CLINICAL DATA:  Headache, new or worsening, cancer history (Age 43-49y) History of breast cancer, metastatic to bone. EXAM: CT HEAD WITHOUT CONTRAST TECHNIQUE: Contiguous axial images were obtained from the base of the skull through the vertex without intravenous contrast. COMPARISON:  No prior head CT. FINDINGS: Brain: Ill-defined edema in the right posterior frontal anterior parietal lobe at the high convexity suspicious for underlying lesion, series 2, image 25. Possible rounded hyperdense lesion abutting the temporal horn of the right  lateral ventricle measuring 8 mm. Small periventricular hyperattenuating focus in the left frontal lobe, series 2, image 19. Questionable edema involving the central cerebellum, right more prominent than left, series 2 images 9 through 11. Slight crowding of the basilar cisterns. No hemorrhage or extra-axial collection. Vascular: No hyperdense vessel. Skull: No evidence of blastic or destructive bone lesion. Sinuses/Orbits: Paranasal sinuses and mastoid air cells are clear. The visualized orbits are unremarkable. Other: None. IMPRESSION: 1. Findings suspicious for intracranial metastatic disease with ill-defined edema in the right frontoparietal lobe at the high convexity as well as bilateral cerebellum. Possible hyperdense nodule in the right temporal lobe abutting the lateral ventricle and periventricular left frontal lobe. Recommend MRI without and with IV contrast for further evaluation. 2. There is slight crowding of the basilar cisterns. These results were called by telephone at the time of interpretation on 08/29/2019 at 6:40 pm to Dr [facility] , who verbally acknowledged these results. Electronically Signed   By: Keith Rake M.D.   On: 08/29/2019 18:40   MR Brain W and Wo Contrast  Result Date: 08/29/2019 CLINICAL DATA:  Headache with vertigo. History of breast carcinoma. EXAM: MRI HEAD WITHOUT AND WITH CONTRAST TECHNIQUE: Multiplanar, multiecho pulse sequences of the brain and surrounding structures were obtained without and with intravenous contrast. CONTRAST:  100mL GADAVIST GADOBUTROL 1 MMOL/ML IV SOLN COMPARISON:  Head CT 08/29/2019 FINDINGS: Brain: Areas of vasogenic edema within both cerebellar hemispheres, the right parietal lobe and the brainstem. No abnormal diffusion restriction. There are numerous (approximately 50) contrast-enhancing lesions throughout the brain and cerebellum. Within the cerebellum, there are at least 25 lesions, the largest of which measures 2.5 x 2.0 cm. There are 3  small lesions in the midbrain. Right temporal lobe lesion measures 9 mm. Right basal ganglia lesion measures 7 mm. Superior right parietal lesion measures 11 mm. No chronic microhemorrhage. Mass effect in the cerebellum compresses the fourth ventricle and distal cerebral aqueduct. Vascular: Normal flow voids. Skull and upper cervical spine: Normal marrow signal. Sinuses/Orbits: Negative. Other: None. IMPRESSION: 1. Numerous (approximately 50) intraparenchymal brain metastases within both hemispheres and most greatly concentrated in the cerebellum. 2. Vasogenic edema is greatest in the cerebellum or there is mass effect on the fourth ventricle and distal cerebral aqueduct. Electronically Signed   By: Ulyses Jarred M.D.   On: 08/29/2019 19:54   US THORACENTESIS ASP PLEURAL SPACE W/IMG GUIDE  Result Date: 08/30/2019 INDICATION: Patient with history of metastatic breast cancer, dyspnea, right pleural effusion. Request received for diagnostic and therapeutic right thoracentesis. EXAM:  ULTRASOUND GUIDED DIAGNOSTIC AND THERAPEUTIC RIGHT THORACENTESIS MEDICATIONS: None COMPLICATIONS: None immediate. PROCEDURE: An ultrasound guided thoracentesis was thoroughly discussed with the patient and questions answered. The benefits, risks, alternatives and complications were also discussed. The patient understands and wishes to proceed with the procedure. Written consent was obtained. Ultrasound was performed to localize and mark an adequate pocket of fluid in the right chest. The area was then prepped and draped in the normal sterile fashion. 1% Lidocaine was used for local anesthesia. Under ultrasound guidance a 6 Fr Safe-T-Centesis catheter was introduced. Thoracentesis was performed. The catheter was removed and a dressing applied. FINDINGS: A total of approximately 700 cc of yellow fluid was removed. Samples were sent to the laboratory as requested by the clinical team. IMPRESSION: Successful ultrasound guided diagnostic and  therapeutic right thoracentesis yielding 700 cc of pleural fluid. Read by: Rowe Robert, PA-C Electronically Signed   By: Jacqulynn Cadet M.D.   On: 08/30/2019 13:11       [facility] If 7PM-7AM, please contact night-coverage at [email or web address], Office  [phone]   08/31/2019, 12:27 PM  LOS: 2 days

## 2019-09-01 ENCOUNTER — Ambulatory Visit (HOSPITAL_COMMUNITY): Payer: Medicare Other | Attending: Hematology and Oncology

## 2019-09-01 ENCOUNTER — Other Ambulatory Visit (HOSPITAL_COMMUNITY): Payer: Medicare Other

## 2019-09-01 ENCOUNTER — Ambulatory Visit
Admit: 2019-09-01 | Discharge: 2019-09-01 | Disposition: A | Discharge status: Home / Self Care | Payer: Medicare Other | Attending: Radiation Oncology | Admitting: Radiation Oncology

## 2019-09-01 DIAGNOSIS — D473 Essential (hemorrhagic) thrombocythemia: Secondary | ICD-10-CM

## 2019-09-01 DIAGNOSIS — D649 Anemia, unspecified: Secondary | ICD-10-CM

## 2019-09-01 LAB — BASIC METABOLIC PANEL
Anion gap: 7 (ref 5–15)
BUN: 19 mg/dL (ref 8–23)
CO2: 30 mmol/L (ref 22–32)
Calcium: 8.3 mg/dL — ABNORMAL LOW (ref 8.9–10.3)
Chloride: 103 mmol/L (ref 98–111)
Creatinine, Ser: 0.85 mg/dL (ref 0.44–1.00)
GFR calc Af Amer: >60 mL/min (ref >60)
GFR calc non Af Amer: >60 mL/min (ref >60)
Glucose, Bld: 122 mg/dL — ABNORMAL HIGH (ref 70–99)
Potassium: 4.4 mmol/L (ref 3.5–5.1)
Sodium: 140 mmol/L (ref 135–145)

## 2019-09-01 LAB — CBC
HCT: 29.4 % — ABNORMAL LOW (ref 36.0–46.0)
Hemoglobin: 9.5 g/dL — ABNORMAL LOW (ref 12.0–15.0)
MCH: 31.9 pg (ref 26.0–34.0)
MCHC: 32.3 g/dL (ref 30.0–36.0)
MCV: 98.7 fL (ref 80.0–100.0)
Platelets: 556 10*3/uL — ABNORMAL HIGH (ref 150–400)
RBC: 2.98 MIL/uL — ABNORMAL LOW (ref 3.87–5.11)
RDW: 15.5 % (ref 11.5–15.5)
WBC: 9.7 10*3/uL (ref 4.0–10.5)
nRBC: 0 % (ref 0.0–0.2)

## 2019-09-01 NOTE — Progress Notes (Signed)
Arrives back to unit via bed, accompanied by spouse and hospital staff.

## 2019-09-01 NOTE — Progress Notes (Signed)
PROGRESS NOTE    Elizabeth [name]  [id number] DOB: 1956-03-14 DOA: 08/29/2019 PCP: [name], PA  Brief Narrative:  The patient is a 64 year old thin chronically ill-appearing Caucasian female with a past medical history significant for but not limited to metastatic left sided breast cancer, hypothyroidism, hypercholesterolemia, history of migraines, vertigo as well as other comorbidities who presented with a chief complaint of dizziness for the last 3 to 4 days with frequent falls.  She denied losing any consciousness however she presented to the ED for these chief complaint and further work-up was done and she had a head CT which showed metastatic lesions and an MRI of the brain which showed multiple metastatic lesions mostly in the cerebellum with mass-effect.  Patient was started on IV Decadron and medical oncology was consulted.  Subsequently radiation oncology was notified and she also went thoracentesis for a right pleural effusion.  Radiation oncology evaluated and felt that she was a candidate for brain radiotherapy was subsequently proceeding with systemic therapy under the care of Dr. Ladona Mow when she has completed radiotherapy.  Radiation oncology recommending a course of 10 fractions with the possibility of their time salvage radiosurgery if needed to the largest lesion in the cerebellum.  Assessment & Plan:   Principal Problem:   Brain metastasis ([place]) Active Problems:   Malignant neoplasm of lower-outer quadrant of left breast of female, estrogen receptor positive ([place])  Dizziness 2/2 Metastatic brain lesions with at least 50 brain Metastasis -Patient has vasogenic edema with mass-effect seen on the MRI brain.   -Started on IV Decadron 8 mg 3 times daily and dose reduced to 4 mg every 8 hours  -Patient was seen by radiation oncology.  Plan for whole brain radiation today.  Patient will need at least 10 treatments and later radiosurgery for large lesion in cerebellum and  systemic chemotherapy.   -C/w Supportive Care and Treatment -C/w antiemetics with prochlorperazine 10 mg every 6 hours as needed for nausea and vomiting as well as Zofran 4 mg p.o./IV every 6 hours as needed -Continue with pain control with  Hypokalemia -Improved. K+ is now 4.4 -Continue to monitor and replete as necessary -Repeat CMP in a.m.  Hypothyroidism -Check TSH in a.m. -Continue with levothyroxine 25 mcg p.o. daily before breakfast  Right Pleural Effusion -Underwent thoracentesis on 08/30/2019 -Repeat chest x-ray in a.m.  Anemia of Dhronic Disease/Normocyctic Anemia -likely from underlying  malignancy -Hemoglobin/hematocrit went from 11.3/34.4 on admission is now 9.5/29.4  -Check anemia panel in the a.m.  -Continue to monitor for signs and symptoms of bleeding; currently no overt bleeding noted -Repeat CBC in a.m.   Thrombocytosis -Likely reactive and worsened in the setting of Steroids -Platelet Count went from 526 -> 463 -> 556 -Continue to Monitor and Trend -Repeat CBC in AM   Hyperglycemia -Elevated on Admission with a Blood Sugar of 151 -Now Daily Blood Sugars ranging from 113-151 on BMP/CMP -Check HbA1c in the AM -Likely to worsen in the setting of Steroid Demargination -If necessary will place on Sensitive Novolog SSI AC  Metastatic Breast Cancer -Patient on chemotherapy per oncology; currently on Pembrolizumab 200 mg -Continue with pain control with oxycodone-acetaminophen as well as oxycodone IR every 8 hours as needed for severe pain and continue tramadol 50 mg p.o. every 6 as needed for moderate pain -Appreciate further care per Oncology   DVT prophylaxis: SCDs Code Status: FULL CODE  Family Communication: No family present at bedside  Disposition Plan: Pending improvement in Dizziness and Nausea  Status is: Inpatient  Remains inpatient appropriate because:Continued Dizziness   Dispo: The patient is from: Home              Anticipated d/c is to:  TBD after PT/OT Eval              Anticipated d/c date is: 2 days              Patient currently is not medically stable to d/c.  Consultants:   Medical Oncology  Radiation Oncology  Interventional Radiology  Palliative Care Medicine    Procedures:  U/S guided Diagnostic and Therapeutic R Thoracentesis yielding 700 mL of Yellow Fluid    Antimicrobials:  Anti-infectives (From admission, onward)   None     Subjective: Seen and examined at bedside and she was still feeling dizzy and nauseous. No CP or SOB. Did not feel well. States she felt better yesterday. No other concerns or complaints at this time.   Objective: Vitals:   08/31/19 0406 08/31/19 1409 08/31/19 1946 09/01/19 0900  BP: 121/80 (!) 143/89 (!) 137/98   Pulse: 93 89 90   Resp: 17 18 17 18   Temp: 98.2 F (36.8 C) 98.3 F (36.8 C) 98.5 F (36.9 C)   TempSrc: Oral Oral Oral   SpO2: 100% 100% 100%   Weight:      Height:        Intake/Output Summary (Last 24 hours) at 09/01/2019 1222 Last data filed at 09/01/2019 [id number] Gross per 24 hour  Intake 476 ml  Output --  Net 476 ml   Filed Weights   08/29/19 1459  Weight: 43.5 kg   Examination: Physical Exam:  Constitutional: Thin cachetic chronically ill appearing Caucasian female who appears uncomfortable Eyes: Lids and conjunctivae normal, sclerae anicteric  ENMT: External Ears, Nose appear normal. Grossly normal hearing.  Neck: Appears normal, supple, no cervical masses, normal ROM, no appreciable thyromegaly; no JVD Respiratory: Diminished to auscultation bilaterally, no wheezing, rales, rhonchi or crackles. Normal respiratory effort and patient is not tachypenic. No accessory muscle use. Unlabored breathing Cardiovascular: RRR, no murmurs / rubs / gallops. S1 and S2 auscultated. No extremity edema. Abdomen: Soft, non-tender, non-distended. Bowel sounds positive.  GU: Deferred. Musculoskeletal: No clubbing / cyanosis of digits/nails. No joint deformity  upper and lower extremities.  Skin: No rashes, lesions, ulcers on a limited skin evaluation. No induration; Warm and dry.  Neurologic: CN 2-12 grossly intact with no focal deficits. Sensation intact in all 4 Extremities, DTR normal.  Psychiatric: Normal judgment and insight. Alert and oriented x 3. Depressed appearing mood and flat affect.   Data Reviewed: I have personally reviewed following labs and imaging studies  CBC: Recent Labs  Lab 08/29/19 1745 08/30/19 0100 08/31/19 0330 09/01/19 0452  WBC 6.2 8.4 9.8 9.7  NEUTROABS 4.5 7.3  --   --   HGB 11.3* 11.3* 9.2* 9.5*  HCT 34.7* 34.4* 28.5* 29.4*  MCV 96.7 96.6 96.6 98.7  PLT 518* 526* 463* [id number]*   Basic Metabolic Panel: Recent Labs  Lab 08/29/19 1745 08/30/19 0100 08/31/19 0330 09/01/19 0452  NA 140 139 138 140  K 3.1* 3.3* 4.3 4.4  CL 99 102 102 103  CO2 30 26 29 30   GLUCOSE 118* 151* 113* 122*  BUN 9 9 16 19   CREATININE 0.84 0.80 0.88 0.85  CALCIUM 9.1 8.4* 8.5* 8.3*   GFR: Estimated Creatinine Clearance: 45.3 mL/min (by C-G formula based on SCr of 0.85 mg/dL). Liver Function Tests: Recent Labs  Lab 08/29/19 1745 08/30/19 0100  AST 44* 38  ALT 19 18  ALKPHOS 97 99  BILITOT 0.7 0.7  PROT 6.7 6.2*  ALBUMIN 3.3* 3.1*   No results for input(s): LIPASE, AMYLASE in the last 168 hours. No results for input(s): AMMONIA in the last 168 hours. Coagulation Profile: No results for input(s): INR, PROTIME in the last 168 hours. Cardiac Enzymes: No results for input(s): CKTOTAL, CKMB, CKMBINDEX, TROPONINI in the last 168 hours. BNP (last 3 results) No results for input(s): PROBNP in the last 8760 hours. HbA1C: No results for input(s): HGBA1C in the last 72 hours. CBG: No results for input(s): GLUCAP in the last 168 hours. Lipid Profile: No results for input(s): CHOL, HDL, LDLCALC, TRIG, CHOLHDL, LDLDIRECT in the last 72 hours. Thyroid Function Tests: No results for input(s): TSH, T4TOTAL, FREET4, T3FREE,  THYROIDAB in the last 72 hours. Anemia Panel: No results for input(s): VITAMINB12, FOLATE, FERRITIN, TIBC, IRON, RETICCTPCT in the last 72 hours. Sepsis Labs: No results for input(s): PROCALCITON, LATICACIDVEN in the last 168 hours.  Recent Results (from the past 240 hour(s))  SARS Coronavirus 2 by RT PCR (hospital order, performed in [facility] hospital lab) Nasopharyngeal Nasopharyngeal Swab     Status: None   Collection Time: 08/29/19  8:34 PM   Specimen: Nasopharyngeal Swab  Result Value Ref Range Status   SARS Coronavirus 2 NEGATIVE NEGATIVE Final    Comment: (NOTE) SARS-CoV-2 target nucleic acids are NOT DETECTED. The SARS-CoV-2 RNA is generally detectable in upper and lower respiratory specimens during the acute phase of infection. The lowest concentration of SARS-CoV-2 viral copies this assay can detect is 250 copies / mL. A negative result does not preclude SARS-CoV-2 infection and should not be used as the sole basis for treatment or other patient management decisions.  A negative result may occur with improper specimen collection / handling, submission of specimen other than nasopharyngeal swab, presence of viral mutation(s) within the areas targeted by this assay, and inadequate number of viral copies (<250 copies / mL). A negative result must be combined with clinical observations, patient history, and epidemiological information. Fact Sheet for Patients:   StrictlyIdeas.no Fact Sheet for Healthcare Providers: BankingDealers.co.za This test is not yet approved or cleared  by the [place] FDA and has been authorized for detection and/or diagnosis of SARS-CoV-2 by FDA under an Emergency Use Authorization (EUA).  This EUA will remain in effect (meaning this test can be used) for the duration of the COVID-19 declaration under Section 564(b)(1) of the Act, 21 U.S.C. section 360bbb-3(b)(1), unless the authorization is  terminated or revoked sooner. Performed at [facility], [place] [address] [place], [place] [address]     RN Pressure Injury Documentation:     Estimated body mass index is 17.01 kg/m as calculated from the following:   Height as of this encounter: 5\' 3"  (1.6 m).   Weight as of this encounter: 43.5 kg.  Malnutrition Type:      Malnutrition Characteristics:      Nutrition Interventions:    Radiology Studies: DG Chest 1 View  Result Date: 08/30/2019 CLINICAL DATA:  Patient status post right thoracentesis. EXAM: CHEST  1 VIEW COMPARISON:  Chest radiograph 06/18/2019. FINDINGS: Right anterior chest wall Port-A-Cath is present with tip projecting over the superior vena cava. Stable enlarged cardiac and mediastinal contours. Similar-appearing consolidation within the left mid upper lung. Small left pleural effusion. Minimal right basilar atelectasis. No right-sided pneumothorax identified. Skin fold projects over the  right thorax. IMPRESSION: Persistent small left pleural effusion. Similar-appearing patchy opacities within the left lung. Electronically Signed   By: Lovey Newcomer M.D.   On: 08/30/2019 12:59   US THORACENTESIS ASP PLEURAL SPACE W/IMG GUIDE  Result Date: 08/30/2019 INDICATION: Patient with history of metastatic breast cancer, dyspnea, right pleural effusion. Request received for diagnostic and therapeutic right thoracentesis. EXAM: ULTRASOUND GUIDED DIAGNOSTIC AND THERAPEUTIC RIGHT THORACENTESIS MEDICATIONS: None COMPLICATIONS: None immediate. PROCEDURE: An ultrasound guided thoracentesis was thoroughly discussed with the patient and questions answered. The benefits, risks, alternatives and complications were also discussed. The patient understands and wishes to proceed with the procedure. Written consent was obtained. Ultrasound was performed to localize and mark an adequate pocket of fluid in the right chest. The area was then prepped and draped in the  normal sterile fashion. 1% Lidocaine was used for local anesthesia. Under ultrasound guidance a 6 Fr Safe-T-Centesis catheter was introduced. Thoracentesis was performed. The catheter was removed and a dressing applied. FINDINGS: A total of approximately 700 cc of yellow fluid was removed. Samples were sent to the laboratory as requested by the clinical team. IMPRESSION: Successful ultrasound guided diagnostic and therapeutic right thoracentesis yielding 700 cc of pleural fluid. Read by: Rowe Robert, PA-C Electronically Signed   By: Jacqulynn Cadet M.D.   On: 08/30/2019 13:11   Scheduled Meds: . Chlorhexidine Gluconate Cloth  6 each Topical Daily  . dexamethasone  4 mg Oral Q8H  . gabapentin  300 mg Oral BID  . gabapentin  300 mg Oral Once  . levothyroxine  25 mcg Oral QAC breakfast  . prednisoLONE acetate  1 drop Both Eyes Daily   Continuous Infusions:   LOS: 3 days   Kerney Elbe, DO [facility] PAGER is on AMION  If 7PM-7AM, please contact night-coverage [email or web address]

## 2019-09-01 NOTE — Progress Notes (Signed)
Assisted to restroom very unsteady with walker and one person assist. Pt off unit via bed. Spouse at bedside.

## 2019-09-01 NOTE — Care Management Important Message (Signed)
Important Message  Patient Details IM Letter given to Roque Lias SW Case Manager to present to the Patient Name: Elizabeth [name] MRN: [id number] Date of Birth: [birth year]   Medicare Important Message Given:        Kerin Salen 09/01/2019, 11:24 AM

## 2019-09-02 ENCOUNTER — Inpatient Hospital Stay (HOSPITAL_COMMUNITY): Payer: Medicare Other

## 2019-09-02 ENCOUNTER — Ambulatory Visit
Admit: 2019-09-02 | Discharge: 2019-09-02 | Disposition: A | Discharge status: Home / Self Care | Payer: Medicare Other | Attending: Radiation Oncology | Admitting: Radiation Oncology

## 2019-09-02 LAB — COMPREHENSIVE METABOLIC PANEL
ALT: 27 U/L (ref 0–44)
AST: 39 U/L (ref 15–41)
Albumin: 2.9 g/dL — ABNORMAL LOW (ref 3.5–5.0)
Alkaline Phosphatase: 82 U/L (ref 38–126)
Anion gap: 8 (ref 5–15)
BUN: 21 mg/dL (ref 8–23)
CO2: 29 mmol/L (ref 22–32)
Calcium: 8.3 mg/dL — ABNORMAL LOW (ref 8.9–10.3)
Chloride: 100 mmol/L (ref 98–111)
Creatinine, Ser: 0.71 mg/dL (ref 0.44–1.00)
GFR calc Af Amer: >60 mL/min (ref >60)
GFR calc non Af Amer: >60 mL/min (ref >60)
Glucose, Bld: 120 mg/dL — ABNORMAL HIGH (ref 70–99)
Potassium: 4.3 mmol/L (ref 3.5–5.1)
Sodium: 137 mmol/L (ref 135–145)
Total Bilirubin: 0.4 mg/dL (ref 0.3–1.2)
Total Protein: 5.6 g/dL — ABNORMAL LOW (ref 6.5–8.1)

## 2019-09-02 LAB — CBC WITH DIFFERENTIAL/PLATELET
Abs Immature Granulocytes: 0.13 10*3/uL — ABNORMAL HIGH (ref 0.00–0.07)
Basophils Absolute: 0 10*3/uL (ref 0.0–0.1)
Basophils Relative: 0 %
Eosinophils Absolute: 0 10*3/uL (ref 0.0–0.5)
Eosinophils Relative: 0 %
HCT: 32 % — ABNORMAL LOW (ref 36.0–46.0)
Hemoglobin: 10.2 g/dL — ABNORMAL LOW (ref 12.0–15.0)
Immature Granulocytes: 1 %
Lymphocytes Relative: 6 %
Lymphs Abs: 0.6 10*3/uL — ABNORMAL LOW (ref 0.7–4.0)
MCH: 31.6 pg (ref 26.0–34.0)
MCHC: 31.9 g/dL (ref 30.0–36.0)
MCV: 99.1 fL (ref 80.0–100.0)
Monocytes Absolute: 0.7 10*3/uL (ref 0.1–1.0)
Monocytes Relative: 7 %
Neutro Abs: 8.8 10*3/uL — ABNORMAL HIGH (ref 1.7–7.7)
Neutrophils Relative %: 86 %
Platelets: 546 10*3/uL — ABNORMAL HIGH (ref 150–400)
RBC: 3.23 MIL/uL — ABNORMAL LOW (ref 3.87–5.11)
RDW: 15.4 % (ref 11.5–15.5)
WBC: 10.3 10*3/uL (ref 4.0–10.5)
nRBC: 0 % (ref 0.0–0.2)

## 2019-09-02 LAB — PHOSPHORUS: Phosphorus: 4.2 mg/dL (ref 2.5–4.6)

## 2019-09-02 LAB — MAGNESIUM: Magnesium: 2.3 mg/dL (ref 1.7–2.4)

## 2019-09-02 MED ORDER — DEXTROSE-NACL 5-0.45 % IV SOLN: INTRAVENOUS | Status: DC

## 2019-09-02 NOTE — Progress Notes (Signed)
OT Cancellation Note  Patient Details Name: Elizabeth [name] MRN: [id number] DOB: [birth year]   Cancelled Treatment:    Reason Eval/Treat Not Completed: Patient at procedure or test/ unavailable. OT will continue to follow for evaluation as schedule allows.   [place] 09/02/2019, 1:22 PM

## 2019-09-02 NOTE — Care Management Important Message (Signed)
Important Message  Patient Details IM Letter given to Shari Heritage SW Case Manager to present to the Patient Name: Elizabeth [name] MRN: [id number] Date of Birth: 07-22-1955   Medicare Important Message Given:  Yes     Kerin Salen 09/02/2019, 10:24 AM

## 2019-09-02 NOTE — Progress Notes (Signed)
Palliative care consult received.    Chart reviewed and I met briefly with Elizabeth [name] this evening.  Briefly discussed role of palliative care team.  She is open to further conversation, but she would like to wait until oncology has the opportunity to weigh in again regarding her situation (hopefully tomorrow).  No charge note  Micheline Rough, MD [place] Team [phone]

## 2019-09-02 NOTE — Progress Notes (Signed)
BP= 170/107. Patient is in pain and anxiety. Alert and oriented x 3. Paged MD coverage at [phone]. Waiting for new order.

## 2019-09-02 NOTE — Evaluation (Signed)
Physical Therapy Evaluation Patient Details Name: Elizabeth [name] MRN: [id number] DOB: 30-Oct-1955 Today's Date: 09/02/2019   History of Present Illness  64 year old thin chronically ill-appearing Caucasian female with a past medical history significant for but not limited to metastatic left sided breast cancer, hypothyroidism, hypercholesterolemia, history of migraines, vertigo as well as other comorbidities who presented with a chief complaint of dizziness for the last 3 to 4 days with frequent falls.  She denied losing any consciousness however she presented to the ED for these chief complaint and further work-up was done and she had a head CT which showed metastatic lesions and an MRI of the brain which showed multiple metastatic lesions mostly in the cerebellum with mass-effect.  Patient was started on IV Decadron and medical oncology was consulted.  Subsequently radiation oncology was notified and she also went thoracentesis for a right pleural effusion.  Radiation oncology evaluated and felt that she was a candidate for brain radiotherapy was subsequently proceeding with systemic therapy under the care of Dr. Ladona Mow when she has completed radiotherapy.  Radiation oncology recommending a course of 10 fractions with the possibility of their time salvage radiosurgery if needed to the largest lesion in the cerebellum.  Clinical Impression  Pt admitted with above diagnosis.  Pt currently with functional limitations due to the deficits listed below (see PT Problem List). Pt will benefit from skilled PT to increase their independence and safety with mobility to allow discharge to the venue listed below.  Pt with very poor coordination and control of movement today. Pt requiring mod assist for stand pivot to [facility].  Pt would benefit from SNF upon d/c.  Will continue to update d/c recommendations as pt progresses.     Follow Up Recommendations SNF;Supervision/Assistance - 24 hour    Equipment  Recommendations  Rolling walker with 5" wheels(vs W/C for safety)    Recommendations for Other Services       Precautions / Restrictions Precautions Precautions: Fall      Mobility  Bed Mobility Overal bed mobility: Needs Assistance Bed Mobility: Supine to Sit;Sit to Supine     Supine to sit: Min assist Sit to supine: Min assist   General bed mobility comments: assist for coordination and control of movement  Transfers Overall transfer level: Needs assistance Equipment used: None Transfers: Sit to/from [facility] Sit to Stand: Mod assist Stand pivot transfers: Mod assist       General transfer comment: assist to/from [facility], RN assisted with hygiene, pt requiring assist for weakness and balance, pt reports dizziness and vitals obtained (in flowsheets, BP high)RN present and aware  Ambulation/Gait                Stairs            Wheelchair Mobility    Modified Rankin (Stroke Patients Only)       Balance Overall balance assessment: History of Falls                                           Pertinent Vitals/Pain Pain Assessment: No/denies pain    Home Living Family/patient expects to be discharged to:: Private residence Living Arrangements: Spouse/significant other   Type of Home: House Home Access: Stairs to enter Entrance Stairs-Rails: None Entrance Stairs-Number of Steps: 3 Home Layout: Able to live on main level with bedroom/bathroom Home Equipment: None Additional Comments: uncertain of  accuracy, poor cognition today    Prior Function Level of Independence: Independent               Hand Dominance        Extremity/Trunk Assessment   Upper Extremity Assessment Upper Extremity Assessment: Generalized weakness    Lower Extremity Assessment Lower Extremity Assessment: Generalized weakness;RLE deficits/detail;LLE deficits/detail RLE Coordination: decreased gross motor LLE Coordination:  decreased gross motor       Communication   Communication: No difficulties  Cognition Arousal/Alertness: Lethargic Behavior During Therapy: Flat affect Overall Cognitive Status: No family/caregiver present to determine baseline cognitive functioning                                 General Comments: pt only orientated to self, unable to recall radiation conversation with RN earlier, reports no falls at home however dizziness with frequent falls listed as chief complaint      General Comments      Exercises     Assessment/Plan    PT Assessment Patient needs continued PT services  PT Problem List Decreased strength;Decreased mobility;Decreased activity tolerance;Decreased balance;Decreased knowledge of use of DME;Decreased cognition       PT Treatment Interventions DME instruction;Therapeutic activities;Cognitive remediation;Gait training;Therapeutic exercise;Patient/family education;Functional mobility training;Balance training    PT Goals (Current goals can be found in the Care Plan section)  Acute Rehab PT Goals PT Goal Formulation: Patient unable to participate in goal setting Time For Goal Achievement: 09/09/19 Potential to Achieve Goals: Fair    Frequency Min 2X/week   Barriers to discharge        Co-evaluation               AM-PAC PT "6 Clicks" Mobility  Outcome Measure Help needed turning from your back to your side while in a flat bed without using bedrails?: A Lot Help needed moving from lying on your back to sitting on the side of a flat bed without using bedrails?: A Lot Help needed moving to and from a bed to a chair (including a wheelchair)?: A Lot Help needed standing up from a chair using your arms (e.g., wheelchair or bedside chair)?: A Lot Help needed to walk in hospital room?: A Lot Help needed climbing 3-5 steps with a railing? : Total 6 Click Score: 11    End of Session   Activity Tolerance: Patient limited by  fatigue Patient left: in bed;with nursing/sitter in room;with call bell/phone within reach;with bed alarm set Nurse Communication: Mobility status PT Visit Diagnosis: Other abnormalities of gait and mobility (R26.89);Unsteadiness on feet (R26.81)    Time: [id number] PT Time Calculation (min) (ACUTE ONLY): 16 min   Charges:   PT Evaluation $PT Eval Low Complexity: 1 Low     Kati PT, DPT Acute Rehabilitation Services Office: [phone]  Trena Platt 09/02/2019, 3:54 PM

## 2019-09-02 NOTE — Progress Notes (Signed)
PROGRESS NOTE    Elizabeth [name]  [id number] DOB: 21-Dec-1955 DOA: 08/29/2019 PCP: [name], PA  Brief Narrative:  The patient is a 64 year old thin chronically ill-appearing Caucasian female with a past medical history significant for but not limited to metastatic left sided breast cancer, hypothyroidism, hypercholesterolemia, history of migraines, vertigo as well as other comorbidities who presented with a chief complaint of dizziness for the last 3 to 4 days with frequent falls.  She denied losing any consciousness however she presented to the ED for these chief complaint and further work-up was done and she had a head CT which showed metastatic lesions and an MRI of the brain which showed multiple metastatic lesions mostly in the cerebellum with mass-effect.  Patient was started on IV Decadron and medical oncology was consulted.  Subsequently radiation oncology was notified and she also went thoracentesis for a right pleural effusion.  Radiation oncology evaluated and felt that she was a candidate for brain radiotherapy was subsequently proceeding with systemic therapy under the care of Dr. Ladona Mow when she has completed radiotherapy.  Radiation oncology recommending a course of 10 fractions with the possibility of their time salvage radiosurgery if needed to the largest lesion in the cerebellum. Today is Day 2/10 of Radiation   Assessment & Plan:   Principal Problem:   Brain metastasis ([place]) Active Problems:   Malignant neoplasm of lower-outer quadrant of left breast of female, estrogen receptor positive (HCC)  Dizziness 2/2 Metastatic brain lesions with at least 50 brain Metastasis -Patient has vasogenic edema with mass-effect seen on the MRI brain.   -Started on IV Decadron 8 mg 3 times daily and dose reduced to 4 mg every 8 hours  -Patient was seen by radiation oncology.  Plan for whole brain radiation today.  Patient will need at least 10 treatments and later radiosurgery for large  lesion in cerebellum and systemic chemotherapy.   -C/w Supportive Care and Treatment -C/w antiemetics with prochlorperazine 10 mg every 6 hours as needed for nausea and vomiting as well as Zofran 4 mg p.o./IV every 6 hours as needed -Continue with pain control with Oxycodone IR and Percocet -Will consult Palliative for Further GOC discussion -Will have Medical Oncology weigh in   Hypokalemia -Improved. K+ is now 4.3 -Continue to monitor and replete as necessary -Repeat CMP in a.m.  Hypothyroidism -Check TSH in a.m. -Continue with levothyroxine 25 mcg p.o. daily before breakfast  Right Pleural Effusion -Underwent thoracentesis on 08/30/2019 -Repeat chest x-ray this a.m showed "Right IJ Port-A-Cath unchanged. Lungs are hypoinflated and continue to demonstrate opacification over the mid to lower left lung. Hazy opacification over the right lung suggesting layering effusion with atelectasis. Cardiomediastinal silhouette and remainder of the exam is unchanged." -Continue to Monitor Respiratory Status carefully   Anemia of Dhronic Disease/Normocyctic Anemia -likely from underlying  malignancy -Hemoglobin/hematocrit went from 11.3/34.4 on admission is now 10.2/32.0 -Check anemia panel in the a.m.  -Continue to monitor for signs and symptoms of bleeding; currently no overt bleeding noted -Repeat CBC in a.m.   Thrombocytosis -Likely reactive and worsened in the setting of Steroids -Platelet Count went from 526 -> 463 -> 556 -> 546 -Continue to Monitor and Trend -Repeat CBC in AM   Hyperglycemia -Elevated on Admission with a Blood Sugar of 151 -Now Daily Blood Sugars ranging from 113-151 on BMP/CMP -Check HbA1c in the AM; Last A1c was 5.4 -Likely to worsen in the setting of Steroid Demargination -If necessary will place on Sensitive Novolog [facility]  Metastatic Breast Cancer -Patient on chemotherapy per oncology; currently on Pembrolizumab 200 mg -Continue with pain control with  oxycodone-acetaminophen as well as oxycodone IR every 8 hours as needed for severe pain and continue tramadol 50 mg p.o. every 6 as needed for moderate pain -Appreciate further care per Oncology  -Have also consulted Palliative Care Medicine for further GOC   DVT prophylaxis: SCDs Code Status: FULL CODE  Family Communication: No family present at bedside  Disposition Plan: Pending improvement in Dizziness and Nausea  Status is: Inpatient  Remains inpatient appropriate because:Continued Dizziness   Dispo: The patient is from: Home              Anticipated d/c is to: SNF              Anticipated d/c date is: 2 days              Patient currently is not medically stable to d/c.  Consultants:   Medical Oncology  Radiation Oncology  Interventional Radiology  Palliative Care Medicine    Procedures:  U/S guided Diagnostic and Therapeutic R Thoracentesis yielding 700 mL of Yellow Fluid    Antimicrobials:  Anti-infectives (From admission, onward)   None     Subjective: Seen and examined at bedside and he is wanting to rest and did not want to eat because she states it made her nauseous.  Still feeling dizzy.  Did not feel well.  No chest pain or shortness of breath.  No other concerns or complaints at this time.  Objective: Vitals:   09/01/19 2119 09/02/19 0559 09/02/19 1355 09/02/19 1418  BP: (!) 141/91 137/83 (!) 168/103 (!) 158/101  Pulse: (!) 102 88 93 93  Resp: 16 12  17   Temp: 98.5 F (36.9 C) 98.6 F (37 C)  98.5 F (36.9 C)  TempSrc: Oral Oral  Oral  SpO2: 96% 98% 96% 100%  Weight:      Height:        Intake/Output Summary (Last 24 hours) at 09/02/2019 1535 Last data filed at 09/02/2019 1503 Gross per 24 hour  Intake 10 ml  Output 450 ml  Net -440 ml   Filed Weights   08/29/19 1459  Weight: 43.5 kg   Examination: Physical Exam:  Constitutional: Thin cachectic chronically ill-appearing Caucasian female who appears uncomfortable and dizzy  again. Eyes: Lids and conjunctivae normal, sclerae anicteric  ENMT: External Ears, Nose appear normal. Grossly normal hearing.  Neck: Appears normal, supple, no cervical masses, normal ROM, no appreciable thyromegaly; no JVD Respiratory: Diminished to auscultation bilaterally, no wheezing, rales, rhonchi or crackles. Normal respiratory effort and patient is not tachypenic. No accessory muscle use.  Unlabored breathing Cardiovascular: RRR, no murmurs / rubs / gallops. S1 and S2 auscultated. No extremity edema.  Abdomen: Soft, non-tender, non-distended. Bowel sounds positive.  GU: Deferred. Musculoskeletal: No clubbing / cyanosis of digits/nails. No joint deformity upper and lower extremities.  Skin: No rashes, lesions, ulcers on limited skin evaluation. No induration; Warm and dry.  Neurologic: CN 2-12 grossly intact with no focal deficits. Romberg sign and cerebellar reflexes not assessed.  Psychiatric: Normal judgment and insight. Alert and oriented x 3.  Depressed appearing mood and flat affect.   Data Reviewed: I have personally reviewed following labs and imaging studies  CBC: Recent Labs  Lab 08/29/19 1745 08/30/19 0100 08/31/19 0330 09/01/19 0452 09/02/19 0331  WBC 6.2 8.4 9.8 9.7 10.3  NEUTROABS 4.5 7.3  --   --  8.8*  HGB  11.3* 11.3* 9.2* 9.5* 10.2*  HCT 34.7* 34.4* 28.5* 29.4* 32.0*  MCV 96.7 96.6 96.6 98.7 99.1  PLT 518* 526* 463* 556* 0000000*   Basic Metabolic Panel: Recent Labs  Lab 08/29/19 1745 08/30/19 0100 08/31/19 0330 09/01/19 0452 09/02/19 0331  NA 140 139 138 140 137  K 3.1* 3.3* 4.3 4.4 4.3  CL 99 102 102 103 100  CO2 30 26 29 30 29   GLUCOSE 118* 151* 113* 122* 120*  BUN 9 9 16 19 21   CREATININE 0.84 0.80 0.88 0.85 0.71  CALCIUM 9.1 8.4* 8.5* 8.3* 8.3*  MG  --   --   --   --  2.3  PHOS  --   --   --   --  4.2   GFR: Estimated Creatinine Clearance: 48.1 mL/min (by C-G formula based on SCr of 0.71 mg/dL). Liver Function Tests: Recent Labs  Lab  08/29/19 1745 08/30/19 0100 09/02/19 0331  AST 44* 38 39  ALT 19 18 27   ALKPHOS 97 99 82  BILITOT 0.7 0.7 0.4  PROT 6.7 6.2* 5.6*  ALBUMIN 3.3* 3.1* 2.9*   No results for input(s): LIPASE, AMYLASE in the last 168 hours. No results for input(s): AMMONIA in the last 168 hours. Coagulation Profile: No results for input(s): INR, PROTIME in the last 168 hours. Cardiac Enzymes: No results for input(s): CKTOTAL, CKMB, CKMBINDEX, TROPONINI in the last 168 hours. BNP (last 3 results) No results for input(s): PROBNP in the last 8760 hours. HbA1C: No results for input(s): HGBA1C in the last 72 hours. CBG: No results for input(s): GLUCAP in the last 168 hours. Lipid Profile: No results for input(s): CHOL, HDL, LDLCALC, TRIG, CHOLHDL, LDLDIRECT in the last 72 hours. Thyroid Function Tests: No results for input(s): TSH, T4TOTAL, FREET4, T3FREE, THYROIDAB in the last 72 hours. Anemia Panel: No results for input(s): VITAMINB12, FOLATE, FERRITIN, TIBC, IRON, RETICCTPCT in the last 72 hours. Sepsis Labs: No results for input(s): PROCALCITON, LATICACIDVEN in the last 168 hours.  Recent Results (from the past 240 hour(s))  SARS Coronavirus 2 by RT PCR (hospital order, performed in [facility] hospital lab) Nasopharyngeal Nasopharyngeal Swab     Status: None   Collection Time: 08/29/19  8:34 PM   Specimen: Nasopharyngeal Swab  Result Value Ref Range Status   SARS Coronavirus 2 NEGATIVE NEGATIVE Final    Comment: (NOTE) SARS-CoV-2 target nucleic acids are NOT DETECTED. The SARS-CoV-2 RNA is generally detectable in upper and lower respiratory specimens during the acute phase of infection. The lowest concentration of SARS-CoV-2 viral copies this assay can detect is 250 copies / mL. A negative result does not preclude SARS-CoV-2 infection and should not be used as the sole basis for treatment or other patient management decisions.  A negative result may occur with improper specimen collection /  handling, submission of specimen other than nasopharyngeal swab, presence of viral mutation(s) within the areas targeted by this assay, and inadequate number of viral copies (<250 copies / mL). A negative result must be combined with clinical observations, patient history, and epidemiological information. Fact Sheet for Patients:   StrictlyIdeas.no Fact Sheet for Healthcare Providers: BankingDealers.co.za This test is not yet approved or cleared  by the [place] FDA and has been authorized for detection and/or diagnosis of SARS-CoV-2 by FDA under an Emergency Use Authorization (EUA).  This EUA will remain in effect (meaning this test can be used) for the duration of the COVID-19 declaration under Section 564(b)(1) of the Act, 21 U.S.C.  section 360bbb-3(b)(1), unless the authorization is terminated or revoked sooner. Performed at [facility], [place] [address] [place], [place] [address]     RN Pressure Injury Documentation:     Estimated body mass index is 17.01 kg/m as calculated from the following:   Height as of this encounter: 5\' 3"  (1.6 m).   Weight as of this encounter: 43.5 kg.  Malnutrition Type:      Malnutrition Characteristics:      Nutrition Interventions:    Radiology Studies: DG CHEST PORT 1 VIEW  Result Date: 09/02/2019 CLINICAL DATA:  Shortness of breath. History of metastatic breast cancer. EXAM: PORTABLE CHEST 1 VIEW COMPARISON:  08/30/2019 FINDINGS: Right IJ Port-A-Cath unchanged. Lungs are hypoinflated and continue to demonstrate opacification over the mid to lower left lung. Hazy opacification over the right lung suggesting layering effusion with atelectasis. Cardiomediastinal silhouette and remainder of the exam is unchanged. IMPRESSION: Stable opacification over the left mid to lower lung likely effusion with associated compressive atelectasis. Suggestion of layering right pleural  effusion/atelectasis. Electronically Signed   By: Marin Olp M.D.   On: 09/02/2019 08:04   Scheduled Meds: . Chlorhexidine Gluconate Cloth  6 each Topical Daily  . dexamethasone  4 mg Oral Q8H  . gabapentin  300 mg Oral BID  . gabapentin  300 mg Oral Once  . levothyroxine  25 mcg Oral QAC breakfast  . prednisoLONE acetate  1 drop Both Eyes Daily   Continuous Infusions:   LOS: 4 days   Kerney Elbe, DO [facility] PAGER is on AMION  If 7PM-7AM, please contact night-coverage [email or web address]

## 2019-09-02 NOTE — Progress Notes (Signed)
   09/02/19 2137  Vitals  Temp 98.8 F (37.1 C)  Temp Source Oral  BP (!) 157/103  MAP (mmHg) 119  BP Location Right Arm  BP Method Automatic  Patient Position (if appropriate) Lying  Pulse Rate 100  Pulse Rate Source Dinamap  ECG Heart Rate (!) 103  Resp 19  Oxygen Therapy  SpO2 99 %  O2 Device Nasal Cannula  O2 Flow Rate (L/min) 2 L/min  MEWS Score  MEWS Temp 0  MEWS Systolic 0  MEWS Pulse 1  MEWS RR 1  MEWS LOC 0  MEWS Score 2  MEWS Score Color Yellow  patient is alert and oriented x 3. Pain is complained and feeling anxious. PRN medication was given as MD ordered. Paged MD as [phone] and waiting for new ordered. Notified charge nurse as well. Patient is on yellow MEWS monitoring protocol.

## 2019-09-03 ENCOUNTER — Ambulatory Visit
Admit: 2019-09-03 | Discharge: 2019-09-03 | Disposition: A | Discharge status: Home / Self Care | Payer: Medicare Other | Attending: Radiation Oncology | Admitting: Radiation Oncology

## 2019-09-03 DIAGNOSIS — C7931 Secondary malignant neoplasm of brain: Principal | ICD-10-CM

## 2019-09-03 DIAGNOSIS — Z515 Encounter for palliative care: Secondary | ICD-10-CM

## 2019-09-03 DIAGNOSIS — C50512 Malignant neoplasm of lower-outer quadrant of left female breast: Secondary | ICD-10-CM

## 2019-09-03 DIAGNOSIS — Z7189 Other specified counseling: Secondary | ICD-10-CM

## 2019-09-03 DIAGNOSIS — Z170 Estrogen receptor positive status [ER+]: Secondary | ICD-10-CM

## 2019-09-03 DIAGNOSIS — J90 Pleural effusion, not elsewhere classified: Secondary | ICD-10-CM

## 2019-09-03 DIAGNOSIS — G936 Cerebral edema: Secondary | ICD-10-CM

## 2019-09-03 LAB — VITAMIN B12: Vitamin B-12: 459 pg/mL (ref 180–914)

## 2019-09-03 LAB — IRON AND TIBC
Iron: 67 ug/dL (ref 28–170)
Saturation Ratios: 23 % (ref 10.4–31.8)
TIBC: 297 ug/dL (ref 250–450)
UIBC: 230 ug/dL

## 2019-09-03 LAB — CBC WITH DIFFERENTIAL/PLATELET
Abs Immature Granulocytes: 0.12 10*3/uL — ABNORMAL HIGH (ref 0.00–0.07)
Basophils Absolute: 0 10*3/uL (ref 0.0–0.1)
Basophils Relative: 0 %
Eosinophils Absolute: 0 10*3/uL (ref 0.0–0.5)
Eosinophils Relative: 0 %
HCT: 38.7 % (ref 36.0–46.0)
Hemoglobin: 12.6 g/dL (ref 12.0–15.0)
Immature Granulocytes: 1 %
Lymphocytes Relative: 5 %
Lymphs Abs: 0.7 10*3/uL (ref 0.7–4.0)
MCH: 31.1 pg (ref 26.0–34.0)
MCHC: 32.6 g/dL (ref 30.0–36.0)
MCV: 95.6 fL (ref 80.0–100.0)
Monocytes Absolute: 0.7 10*3/uL (ref 0.1–1.0)
Monocytes Relative: 6 %
Neutro Abs: 11.1 10*3/uL — ABNORMAL HIGH (ref 1.7–7.7)
Neutrophils Relative %: 88 %
Platelets: 649 10*3/uL — ABNORMAL HIGH (ref 150–400)
RBC: 4.05 MIL/uL (ref 3.87–5.11)
RDW: 14.7 % (ref 11.5–15.5)
WBC: 12.6 10*3/uL — ABNORMAL HIGH (ref 4.0–10.5)
nRBC: 0 % (ref 0.0–0.2)

## 2019-09-03 LAB — COMPREHENSIVE METABOLIC PANEL
ALT: 30 U/L (ref 0–44)
AST: 43 U/L — ABNORMAL HIGH (ref 15–41)
Albumin: 3.4 g/dL — ABNORMAL LOW (ref 3.5–5.0)
Alkaline Phosphatase: 92 U/L (ref 38–126)
Anion gap: 8 (ref 5–15)
BUN: 17 mg/dL (ref 8–23)
CO2: 28 mmol/L (ref 22–32)
Calcium: 8.4 mg/dL — ABNORMAL LOW (ref 8.9–10.3)
Chloride: 94 mmol/L — ABNORMAL LOW (ref 98–111)
Creatinine, Ser: 0.58 mg/dL (ref 0.44–1.00)
GFR calc Af Amer: >60 mL/min (ref >60)
GFR calc non Af Amer: >60 mL/min (ref >60)
Glucose, Bld: 164 mg/dL — ABNORMAL HIGH (ref 70–99)
Potassium: 3.8 mmol/L (ref 3.5–5.1)
Sodium: 130 mmol/L — ABNORMAL LOW (ref 135–145)
Total Bilirubin: 0.7 mg/dL (ref 0.3–1.2)
Total Protein: 6.3 g/dL — ABNORMAL LOW (ref 6.5–8.1)

## 2019-09-03 LAB — RETICULOCYTES
Immature Retic Fract: 12 % (ref 2.3–15.9)
RBC.: 3.95 MIL/uL (ref 3.87–5.11)
Retic Count, Absolute: 66.8 10*3/uL (ref 19.0–186.0)
Retic Ct Pct: 1.7 % (ref 0.4–3.1)

## 2019-09-03 LAB — GLUCOSE, CAPILLARY: Glucose-Capillary: 128 mg/dL — ABNORMAL HIGH (ref 70–99)

## 2019-09-03 LAB — FOLATE: Folate: 17 ng/mL (ref >5.9)

## 2019-09-03 LAB — MAGNESIUM: Magnesium: 2.2 mg/dL (ref 1.7–2.4)

## 2019-09-03 LAB — FERRITIN: Ferritin: 516 ng/mL — ABNORMAL HIGH (ref 11–307)

## 2019-09-03 LAB — HEMOGLOBIN A1C
Hgb A1c MFr Bld: 6.3 % — ABNORMAL HIGH (ref 4.8–5.6)
Mean Plasma Glucose: 134.11 mg/dL

## 2019-09-03 LAB — PHOSPHORUS: Phosphorus: 3.2 mg/dL (ref 2.5–4.6)

## 2019-09-03 LAB — TSH: TSH: 1.054 u[IU]/mL (ref 0.350–4.500)

## 2019-09-03 MED ORDER — LIP MEDEX EX OINT
TOPICAL_OINTMENT | CUTANEOUS | Status: DC | PRN
  Filled 2019-09-03: qty 7

## 2019-09-03 NOTE — Progress Notes (Addendum)
HEMATOLOGY-ONCOLOGY PROGRESS NOTE  SUBJECTIVE: Appears much weaker today.  Falls asleep in the middle of a sentence.  Reports that she is still having headache.  Not really able to take anything by mouth due to nausea and vomiting.  The patient thinks that she is at the cancer center and that it is 11.  Oncology History  Breast cancer of lower-outer quadrant of left female breast ([place])  12/13/2008 Initial Diagnosis   Left breast biopsy: Invasive ductal carcinoma ER 90% percent, PR 41%, Ki-67 11%, HER-2 negative ratio 1, Oncotype DX score 23, ROR15%   01/27/2009 - 03/10/2009 Neo-Adjuvant Chemotherapy   Neoadjuvant FEC 4; participant in the "bed sheets" study.   05/12/2009 -  Anti-estrogen oral therapy   Femara 2.5 mg daily   05/22/2009 Surgery   Bilateral mastectomy stage IIB T2 N0 M0 IDC left breast grade 1, 2.5 cm, ER 99%, PR 41%, Ki-67 11%, HER-2 negative   12/10/2009 Surgery   Breast reconstruction surgery   11/14/2017 Relapse/Recurrence   Mid back pain: MRI revealed T11 severe pathologic fracture, abnormal signal T10, T11 and T12, moderate canal stenosis at T11   12/03/2017 PET scan   Hypermetabolic metastatic breast cancer involving thoracic and upper abdominal lymph nodes, spine and ribs. There may be hypermetabolic adenopathy in the left neck; Hypermetabolic lymph node versus intramuscular metastasis involving the medial left pectoralis musculature Pathologic T11 fracture     12/11/2017 Procedure   Biopsy of T11 vertebral body lytic lesion: Metastatic breast adenocarcinoma ER positive, PR negative   12/19/2017 - 12/26/2017 Radiation Therapy   Palliative XRT to T 11   01/12/2018 - 10/26/2018 Anti-estrogen oral therapy   Patient could not tolerate Faslodex because of severe hypotension and severe pain in the legs after injections.  Ibrance with letrozole starting 01/12/2018   10/27/2018 -  Chemotherapy   Palliative chemotherapy with Halaven   05/03/2019 -  Anti-estrogen oral therapy    Alpelisib with Faslodex   Malignant neoplasm of lower-outer quadrant of left breast of female, estrogen receptor positive ([place])  05/02/2018 Miscellaneous   Alpelisib with Faslodex discontinued 08/23/2019 for progression   10/21/2018 Initial Diagnosis   Malignant neoplasm of lower-outer quadrant of left breast of female, estrogen receptor positive ([place])   10/27/2018 - 05/02/2019 Chemotherapy   The patient had eriBULin mesylate (HALAVEN) 1.65 mg in sodium chloride 0.9 % 100 mL chemo infusion, 1.1 mg/m2 = 2.1 mg, Intravenous,  Once, 9 of 10 cycles Dose modification: 1.1 mg/m2 (original dose 1.4 mg/m2, Cycle 1, Reason: Other (see comments), Comment: CrCL < 50) Administration: 1.65 mg (10/27/2018), 1.65 mg (11/03/2018), 1.65 mg (11/17/2018), 1.65 mg (11/24/2018), 1.65 mg (12/08/2018), 1.65 mg (12/15/2018), 1.65 mg (12/29/2018), 1.65 mg (01/18/2019), 1.65 mg (02/08/2019), 1.65 mg (03/01/2019), 1.65 mg (03/22/2019), 1.65 mg (04/12/2019)  for chemotherapy treatment.    11/17/2018 - 12/03/2018 Radiation Therapy   Palliative radiation to the T5 location of the spine and right femur, 35 Gy in 14 fractions   04/05/2019 Miscellaneous   Guardant 360:PIK3CA mutation (alpelisib), NF1 S2 467 (everolimus) ARID1A S1791 (response to neratinib, olaparib, rucaparib, talazoparib), T p53 and SMAD4 Q534, TMB 35.41 mutations /MB, no evidence of MSI high   08/30/2019 -  Chemotherapy   The patient had pembrolizumab (KEYTRUDA) 200 mg in sodium chloride 0.9 % 50 mL chemo infusion, 200 mg, Intravenous, Once, 0 of 6 cycles  for chemotherapy treatment.       REVIEW OF SYSTEMS:   Unable to obtain a comprehensive review of systems secondary to patient condition.  Discussed with nursing.  Pertinent positives noted in the HPI.  I have reviewed the past medical history, past surgical history, social history and family history with the patient and they are unchanged from previous note.   PHYSICAL EXAMINATION: ECOG PERFORMANCE STATUS: 4 -  Bedbound  Vitals:   09/03/19 0410 09/03/19 0821  BP: (!) 168/103 (!) 164/100  Pulse: 97 (!) 103  Resp:  16  Temp: 97.6 F (36.4 C) 98.6 F (37 C)  SpO2: 97% 100%   Filed Weights   08/29/19 1459  Weight: 43.5 kg    Intake/Output from previous day: 05/20 0701 - 05/21 0700 In: 723.9 [P.O.:30; I.V.:693.9] Out: 450 [Urine:450]  GENERAL: Somnolent, no distress SKIN: skin color, texture, turgor are normal, no rashes or significant lesions OROPHARYNX:no exudate, no erythema and lips, buccal mucosa, and tongue normal  LUNGS: Diminished bilaterally HEART: regular rate & rhythm and no murmurs and no lower extremity edema ABDOMEN:abdomen soft, non-tender and normal bowel sounds NEURO: Somnolent, falls asleep in the middle of a sentence, oriented to person only.  LABORATORY DATA:  I have reviewed the data as listed CMP Latest Ref Rng & Units 09/03/2019 09/02/2019 09/01/2019  Glucose 70 - 99 mg/dL 164(H) 120(H) 122(H)  BUN 8 - 23 mg/dL '17 21 19  ' Creatinine 0.44 - 1.00 mg/dL 0.58 0.71 0.85  Sodium 135 - 145 mmol/L 130(L) 137 140  Potassium 3.5 - 5.1 mmol/L 3.8 4.3 4.4  Chloride 98 - 111 mmol/L 94(L) 100 103  CO2 22 - 32 mmol/L '28 29 30  ' Calcium 8.9 - 10.3 mg/dL 8.4(L) 8.3(L) 8.3(L)  Total Protein 6.5 - 8.1 g/dL 6.3(L) 5.6(L) -  Total Bilirubin 0.3 - 1.2 mg/dL 0.7 0.4 -  Alkaline Phos 38 - 126 U/L 92 82 -  AST 15 - 41 U/L 43(H) 39 -  ALT 0 - 44 U/L 30 27 -    Lab Results  Component Value Date   WBC 12.6 (H) 09/03/2019   HGB 12.6 09/03/2019   HCT 38.7 09/03/2019   MCV 95.6 09/03/2019   PLT 649 (H) 09/03/2019   NEUTROABS 11.1 (H) 09/03/2019    DG Chest 1 View  Result Date: 08/30/2019 CLINICAL DATA:  Patient status post right thoracentesis. EXAM: CHEST  1 VIEW COMPARISON:  Chest radiograph 06/18/2019. FINDINGS: Right anterior chest wall Port-A-Cath is present with tip projecting over the superior vena cava. Stable enlarged cardiac and mediastinal contours. Similar-appearing  consolidation within the left mid upper lung. Small left pleural effusion. Minimal right basilar atelectasis. No right-sided pneumothorax identified. Skin fold projects over the right thorax. IMPRESSION: Persistent small left pleural effusion. Similar-appearing patchy opacities within the left lung. Electronically Signed   By: Lovey Newcomer M.D.   On: 08/30/2019 12:59   CT Head Wo Contrast  Result Date: 08/29/2019 CLINICAL DATA:  Headache, new or worsening, cancer history (Age 4-49y) History of breast cancer, metastatic to bone. EXAM: CT HEAD WITHOUT CONTRAST TECHNIQUE: Contiguous axial images were obtained from the base of the skull through the vertex without intravenous contrast. COMPARISON:  No prior head CT. FINDINGS: Brain: Ill-defined edema in the right posterior frontal anterior parietal lobe at the high convexity suspicious for underlying lesion, series 2, image 25. Possible rounded hyperdense lesion abutting the temporal horn of the right lateral ventricle measuring 8 mm. Small periventricular hyperattenuating focus in the left frontal lobe, series 2, image 19. Questionable edema involving the central cerebellum, right more prominent than left, series 2 images 9 through 11. Slight crowding of the  basilar cisterns. No hemorrhage or extra-axial collection. Vascular: No hyperdense vessel. Skull: No evidence of blastic or destructive bone lesion. Sinuses/Orbits: Paranasal sinuses and mastoid air cells are clear. The visualized orbits are unremarkable. Other: None. IMPRESSION: 1. Findings suspicious for intracranial metastatic disease with ill-defined edema in the right frontoparietal lobe at the high convexity as well as bilateral cerebellum. Possible hyperdense nodule in the right temporal lobe abutting the lateral ventricle and periventricular left frontal lobe. Recommend MRI without and with IV contrast for further evaluation. 2. There is slight crowding of the basilar cisterns. These results were called  by telephone at the time of interpretation on 08/29/2019 at 6:40 pm to Dr [facility] Tyrone Nine , who verbally acknowledged these results. Electronically Signed   By: Keith Rake M.D.   On: 08/29/2019 18:40   CT Chest W Contrast  Result Date: 08/19/2019 CLINICAL DATA:  Metastatic breast cancer.  Restaging. EXAM: CT CHEST, ABDOMEN, AND PELVIS WITH CONTRAST TECHNIQUE: Multidetector CT imaging of the chest, abdomen and pelvis was performed following the standard protocol during bolus administration of intravenous contrast. CONTRAST:  66m OMNIPAQUE IOHEXOL 300 MG/ML  SOLN COMPARISON:  04/30/2019 FINDINGS: CT CHEST FINDINGS Cardiovascular: Normal heart size. Small pericardial effusion is unchanged from the previous exam. Aortic atherosclerosis. Lad coronary artery atherosclerotic calcification. Mediastinum/Nodes: Normal appearance of the thyroid gland. The trachea appears patent and is midline. Normal appearance of the esophagus. Index pericardial lymph node has decreased in size from previous exam measuring 0.5 cm on today's study, image 49/2. On the previous exam this measured 1.4 cm (long axis). Left axillary index lymph node measures 0.8 x 0.7 cm, image 17/2. Previously 1.4 x 1.0 cm. Low-density lymph node posterior to the right bronchus intermedius measures 1.1 cm, image 29/2. Previously 1.3 cm. Lungs/Pleura: Loculated left pleural effusion with pleural thickening and enhancement is unchanged when compared with the previous exam. Extensive architectural distortion and fibrosis with volume loss noted in the left lung. This is unchanged from the previous exam. Paramediastinal consolidation and fibrosis within the right upper lobe is similar to previous exam. There is a new moderate right pleural effusion. Also new is near complete atelectasis of the right middle lobe, image 65/4. Musculoskeletal: Multifocal sclerotic bone metastases are identified. T4 and T11 compression fractures are unchanged. CT ABDOMEN PELVIS FINDINGS  Hepatobiliary: There is a low-attenuation lesion within posterior right hepatic lobe measuring 1.1 cm, image 57/2. This is a new finding when compared with the previous exam. The there are several additional low-density lesions which are less than 1 cm and appear unchanged from the prior exam. Gallbladder unremarkable. No biliary dilatation. Pancreas: Unremarkable. No pancreatic ductal dilatation or surrounding inflammatory changes. Spleen: Normal in size without focal abnormality. Adrenals/Urinary Tract: Bilateral areas of low attenuation involving both upper poles of the kidneys noted. No mass or hydronephrosis identified bilaterally. Stomach/Bowel: Stomach is within normal limits. Appendix appears normal. No evidence of bowel wall thickening, distention, or inflammatory changes. Vascular/Lymphatic: Aortic atherosclerosis. There is a infiltrative soft tissue mass within the left periaortic region at the level of the superior mesenteric artery. This measures 3.4 x 2.0 by 2.5 cm, image 73/5 and image 55/2. This partially encases the celiac trunk and superior mesenteric artery as well as the left adrenal gland. Present on the previous exam this measured 3.2 x 1.6 by 2.8 cm. Reproductive: Uterus and bilateral adnexa are unremarkable. Other: There is trace free fluid identified within the abdomen and pelvis. Musculoskeletal: Previous left hip arthroplasty. Multifocal sclerotic bone metastases are  again noted and appears similar to the previous exam IMPRESSION: 1. New low-attenuation lesion within right lobe of liver, suspicious for metastatic disease. There is also a new moderate right pleural effusion. Consider further evaluation with diagnostic thoracentesis to evaluate for tumor involvement of the right hemithorax. 2. Similar appearance of loculated left-sided pleural effusion with pleural thickening and enhancement compatible with pleural spread of disease. 3. Interval decrease in size of enhancing lymph nodes  within the mediastinum and left axilla. 4. Similar size of left retroperitoneal mass which involves the celiac artery, SMA and left adrenal gland. 5. Unchanged small pericardial effusion 6. Unchanged appearance of multifocal sclerotic bone metastases with pathologic fractures involving T4 and T11 vertebra. 7. Geographic distribution of low attenuation within the upper poles of both kidneys is favored to represent sequelae of external beam radiation. 8. Coronary artery calcifications 9.  Aortic Atherosclerosis (ICD10-I70.0). Electronically Signed   By: Kerby Moors M.D.   On: 08/19/2019 10:50   MR Brain W and Wo Contrast  Result Date: 08/29/2019 CLINICAL DATA:  Headache with vertigo. History of breast carcinoma. EXAM: MRI HEAD WITHOUT AND WITH CONTRAST TECHNIQUE: Multiplanar, multiecho pulse sequences of the brain and surrounding structures were obtained without and with intravenous contrast. CONTRAST:  55m GADAVIST GADOBUTROL 1 MMOL/ML IV SOLN COMPARISON:  Head CT 08/29/2019 FINDINGS: Brain: Areas of vasogenic edema within both cerebellar hemispheres, the right parietal lobe and the brainstem. No abnormal diffusion restriction. There are numerous (approximately 50) contrast-enhancing lesions throughout the brain and cerebellum. Within the cerebellum, there are at least 25 lesions, the largest of which measures 2.5 x 2.0 cm. There are 3 small lesions in the midbrain. Right temporal lobe lesion measures 9 mm. Right basal ganglia lesion measures 7 mm. Superior right parietal lesion measures 11 mm. No chronic microhemorrhage. Mass effect in the cerebellum compresses the fourth ventricle and distal cerebral aqueduct. Vascular: Normal flow voids. Skull and upper cervical spine: Normal marrow signal. Sinuses/Orbits: Negative. Other: None. IMPRESSION: 1. Numerous (approximately 50) intraparenchymal brain metastases within both hemispheres and most greatly concentrated in the cerebellum. 2. Vasogenic edema is greatest  in the cerebellum or there is mass effect on the fourth ventricle and distal cerebral aqueduct. Electronically Signed   By: KUlyses JarredM.D.   On: 08/29/2019 19:54   CT Abdomen Pelvis W Contrast  Result Date: 08/19/2019 CLINICAL DATA:  Metastatic breast cancer.  Restaging. EXAM: CT CHEST, ABDOMEN, AND PELVIS WITH CONTRAST TECHNIQUE: Multidetector CT imaging of the chest, abdomen and pelvis was performed following the standard protocol during bolus administration of intravenous contrast. CONTRAST:  769mOMNIPAQUE IOHEXOL 300 MG/ML  SOLN COMPARISON:  04/30/2019 FINDINGS: CT CHEST FINDINGS Cardiovascular: Normal heart size. Small pericardial effusion is unchanged from the previous exam. Aortic atherosclerosis. Lad coronary artery atherosclerotic calcification. Mediastinum/Nodes: Normal appearance of the thyroid gland. The trachea appears patent and is midline. Normal appearance of the esophagus. Index pericardial lymph node has decreased in size from previous exam measuring 0.5 cm on today's study, image 49/2. On the previous exam this measured 1.4 cm (long axis). Left axillary index lymph node measures 0.8 x 0.7 cm, image 17/2. Previously 1.4 x 1.0 cm. Low-density lymph node posterior to the right bronchus intermedius measures 1.1 cm, image 29/2. Previously 1.3 cm. Lungs/Pleura: Loculated left pleural effusion with pleural thickening and enhancement is unchanged when compared with the previous exam. Extensive architectural distortion and fibrosis with volume loss noted in the left lung. This is unchanged from the previous exam. Paramediastinal consolidation and  fibrosis within the right upper lobe is similar to previous exam. There is a new moderate right pleural effusion. Also new is near complete atelectasis of the right middle lobe, image 65/4. Musculoskeletal: Multifocal sclerotic bone metastases are identified. T4 and T11 compression fractures are unchanged. CT ABDOMEN PELVIS FINDINGS Hepatobiliary: There is  a low-attenuation lesion within posterior right hepatic lobe measuring 1.1 cm, image 57/2. This is a new finding when compared with the previous exam. The there are several additional low-density lesions which are less than 1 cm and appear unchanged from the prior exam. Gallbladder unremarkable. No biliary dilatation. Pancreas: Unremarkable. No pancreatic ductal dilatation or surrounding inflammatory changes. Spleen: Normal in size without focal abnormality. Adrenals/Urinary Tract: Bilateral areas of low attenuation involving both upper poles of the kidneys noted. No mass or hydronephrosis identified bilaterally. Stomach/Bowel: Stomach is within normal limits. Appendix appears normal. No evidence of bowel wall thickening, distention, or inflammatory changes. Vascular/Lymphatic: Aortic atherosclerosis. There is a infiltrative soft tissue mass within the left periaortic region at the level of the superior mesenteric artery. This measures 3.4 x 2.0 by 2.5 cm, image 73/5 and image 55/2. This partially encases the celiac trunk and superior mesenteric artery as well as the left adrenal gland. Present on the previous exam this measured 3.2 x 1.6 by 2.8 cm. Reproductive: Uterus and bilateral adnexa are unremarkable. Other: There is trace free fluid identified within the abdomen and pelvis. Musculoskeletal: Previous left hip arthroplasty. Multifocal sclerotic bone metastases are again noted and appears similar to the previous exam IMPRESSION: 1. New low-attenuation lesion within right lobe of liver, suspicious for metastatic disease. There is also a new moderate right pleural effusion. Consider further evaluation with diagnostic thoracentesis to evaluate for tumor involvement of the right hemithorax. 2. Similar appearance of loculated left-sided pleural effusion with pleural thickening and enhancement compatible with pleural spread of disease. 3. Interval decrease in size of enhancing lymph nodes within the mediastinum and  left axilla. 4. Similar size of left retroperitoneal mass which involves the celiac artery, SMA and left adrenal gland. 5. Unchanged small pericardial effusion 6. Unchanged appearance of multifocal sclerotic bone metastases with pathologic fractures involving T4 and T11 vertebra. 7. Geographic distribution of low attenuation within the upper poles of both kidneys is favored to represent sequelae of external beam radiation. 8. Coronary artery calcifications 9.  Aortic Atherosclerosis (ICD10-I70.0). Electronically Signed   By: Kerby Moors M.D.   On: 08/19/2019 10:50   DG CHEST PORT 1 VIEW  Result Date: 09/02/2019 CLINICAL DATA:  Shortness of breath. History of metastatic breast cancer. EXAM: PORTABLE CHEST 1 VIEW COMPARISON:  08/30/2019 FINDINGS: Right IJ Port-A-Cath unchanged. Lungs are hypoinflated and continue to demonstrate opacification over the mid to lower left lung. Hazy opacification over the right lung suggesting layering effusion with atelectasis. Cardiomediastinal silhouette and remainder of the exam is unchanged. IMPRESSION: Stable opacification over the left mid to lower lung likely effusion with associated compressive atelectasis. Suggestion of layering right pleural effusion/atelectasis. Electronically Signed   By: Marin Olp M.D.   On: 09/02/2019 08:04   US THORACENTESIS ASP PLEURAL SPACE W/IMG GUIDE  Result Date: 08/30/2019 INDICATION: Patient with history of metastatic breast cancer, dyspnea, right pleural effusion. Request received for diagnostic and therapeutic right thoracentesis. EXAM: ULTRASOUND GUIDED DIAGNOSTIC AND THERAPEUTIC RIGHT THORACENTESIS MEDICATIONS: None COMPLICATIONS: None immediate. PROCEDURE: An ultrasound guided thoracentesis was thoroughly discussed with the patient and questions answered. The benefits, risks, alternatives and complications were also discussed. The patient understands and wishes  to proceed with the procedure. Written consent was obtained.  Ultrasound was performed to localize and mark an adequate pocket of fluid in the right chest. The area was then prepped and draped in the normal sterile fashion. 1% Lidocaine was used for local anesthesia. Under ultrasound guidance a 6 Fr Safe-T-Centesis catheter was introduced. Thoracentesis was performed. The catheter was removed and a dressing applied. FINDINGS: A total of approximately 700 cc of yellow fluid was removed. Samples were sent to the laboratory as requested by the clinical team. IMPRESSION: Successful ultrasound guided diagnostic and therapeutic right thoracentesis yielding 700 cc of pleural fluid. Read by: Rowe Robert, PA-C Electronically Signed   By: Jacqulynn Cadet M.D.   On: 08/30/2019 13:11    ASSESSMENT AND PLAN: 1.  Metastatic breast cancer 2.  New brain metastases 3.  Right pleural effusion 4.  Mild anemia, resolved 5.  Hypokalemia, resolved 6.  Leukocytosis and thrombocytosis due to dexamethasone  -The patient is much more frail than the last time I saw her earlier this week.  She is oriented to person only at this point in time.  Will discuss with her primary medical oncologist, but I am concerned that she will not recover well enough to receive additional systemic therapy. -Appreciate assistance of hospitalist and the palliative care team.  Will need to engage the family and goals of care discussion. -Continue dexamethasone and whole brain radiation.   LOS: 5 days   Mikey Bussing, DNP, AGPCNP-BC, AOCNP 09/03/19  Hematology oncology Attending Note  I personally saw the patient, reviewed the chart and examined the patient. The plan of care was discussed with the patient and the admitting team. I agree with the assessment and plan as documented above. Thank you very much for the consultation.  Metastatic breast cancer with extensive brain metastases: I discussed with the patient and her husband about her declining performance status as well as mental status. We  discussed her prognosis in great detail. I do not believe that additional systemic therapy would be of any benefit to her. We went discussed whether or not to continue with radiation. I spoke with Dr. Lisbeth Renshaw who is going to evaluate her today and decide if further continuation of radiation is warranted.  My recommendation is hospice care.  And patient and her family would prefer inpatient hospice unit.

## 2019-09-03 NOTE — Progress Notes (Signed)
OT Cancellation Note  Patient Details Name: RAYCHELLE WHITEHILL MRN: [id number] DOB: April 02, 1956   Cancelled Treatment:    Reason Eval/Treat Not Completed: Patient's level of consciousness(Hold therapy evaluation today per RN. Patient not alert enough to participate.)  Vonda L Carmack 09/03/2019, 12:00 PM

## 2019-09-03 NOTE — Plan of Care (Signed)
  Problem: Clinical Measurements: Goal: Ability to maintain clinical measurements within normal limits will improve Outcome: Progressing   Problem: Education: Goal: Knowledge of General Education information will improve Description: Including pain rating scale, medication(s)/side effects and non-pharmacologic comfort measures Outcome: Progressing   

## 2019-09-03 NOTE — Consult Note (Addendum)
Palliative Care Consult Note  Reason for consult: Goals of care in light of metastatic disease and decreased cognition and functional status  Palliative Care Consult Received.  Chart reviewed including personal review of pertinent labs and imaging.  I met today with patients husband.  Elizabeth [name] is somnolent and does not participate in conversation.  Briefly, Elizabeth [name] is a 64 year old female with past medical history of metastatic left-sided breast cancer, hypothyroidism, hypercholesterolemia, history of migraines and vertigo who presented with chief complaint of dizziness and frequent falls.  Following admission to the ED she was demonstrated to have new lesions with over 50 noted in her brain with cerebellar masslike effect.  Radiation oncology evaluated and she is started on radiotherapy.  Unfortunately, despite intervention, her condition has declined to the point where she has now largely somnolent.  Dr. Lindi [name] evaluated her and feeling is that she is not likely to benefit from further systemic therapy with recommendation for consideration for residential hospice.  Palliative consulted for goals of care  We discussed clinical course as well as wishes moving forward in regard to care plan in light of her continued decline.  Concepts specific to code status and care this hospitalization discussed.  We discussed difference between a aggressive medical intervention path and a palliative, comfort focused care path.  Values and goals of care important to patient and family were attempted to be elicited.  Her husband reports that he was recommended to consider residential hospice by Dr. Lindi [name].  Per his request, we discussed differences in options for hospice care, including home hospice vs residential hospice.  Her husband reported that at this point, he was thinking that residential hospice in [place] is best option moving forward.  They are planning for her to go to see Dr. Lisbeth [name] for his opinion  at [facility]  Patient's sister, Elizabeth [name], then joined [place] in the room as well.  We reviewed again above conversation regarding care plan moving forward with recommendation for residential hospice and focusing on more of a comfort based approach.  Plan is to see if Dr. Lisbeth [name] feels further radiation is likely to add time or quality to her life.  If it is not the case where further radiation is indicated with the goal of adding time in quality to her life, they would like to pursue placement at residential hospice in [place].  I called and discussed case with hospice liaison to inform them of the situation and likely need for residential hospice bed evaluation.  Start time: 1450 End time: 1550  Total time: 60 minutes Greater than 50%  of this time was spent counseling and coordinating care related to the above assessment and plan.  Elizabeth Rough, MD [place] Team [phone]  ADDENDUM - Plan reviewed with Dr. Lisbeth [name] after he evaluated Elizabeth [name] as well.  Plan to see how she does this weekend and if no significant improvement this weekend, plan for residential hospice early next week.

## 2019-09-03 NOTE — Progress Notes (Signed)
PROGRESS NOTE    Elizabeth [name]  [id number] DOB: 1955-09-09 DOA: 08/29/2019 PCP: [name], PA  Brief Narrative:  The patient is a 64 year old thin chronically ill-appearing Caucasian female with a past medical history significant for but not limited to metastatic left sided breast cancer, hypothyroidism, hypercholesterolemia, history of migraines, vertigo as well as other comorbidities who presented with a chief complaint of dizziness for the last 3 to 4 days with frequent falls.  She denied losing any consciousness however she presented to the ED for these chief complaint and further work-up was done and she had a head CT which showed metastatic lesions and an MRI of the brain which showed multiple metastatic lesions mostly in the cerebellum with mass-effect.  Patient was started on IV Decadron and medical oncology was consulted.  Subsequently radiation oncology was notified and she also went thoracentesis for a right pleural effusion.  Radiation oncology evaluated and felt that she was a candidate for brain radiotherapy was subsequently proceeding with systemic therapy under the care of Dr. Ladona Mow when she has completed radiotherapy.  Radiation oncology recommending a course of 10 fractions with the possibility of their time salvage radiosurgery if needed to the largest lesion in the cerebellum. Today is Day 4/10 of Radiation.  Local oncology has evaluated the patient and now they are recommending hospice care as they believe that any additional systemic therapy would not be of any benefit.  Dr. Quintella Reichert is to speak with Dr. Lisbeth Renshaw and they will decide if further radiation is warranted..Of care has been consulted for goals of care discussion likely if they are agreeable will transition to residential hospice.  Assessment & Plan:   Principal Problem:   Brain metastasis ([place]) Active Problems:   Malignant neoplasm of lower-outer quadrant of left breast of female, estrogen receptor positive  (HCC)   Pleural effusion   Vasogenic brain edema (HCC)  Dizziness 2/2 Metastatic brain lesions with at least 50 brain Metastasis -Patient has vasogenic edema with mass-effect seen on the MRI brain.   -Started on IV Decadron 8 mg 3 times daily and dose reduced to 4 mg every 8 hours  and will continue for now -Patient was seen by radiation oncology.  Plan for whole brain radiation again.  Patient will need at least 10 treatments and later radiosurgery for large lesion in cerebellum and systemic chemotherapy.   -C/w Supportive Care and Treatment -C/w antiemetics with prochlorperazine 10 mg every 6 hours as needed for nausea and vomiting as well as Zofran 4 mg p.o./IV every 6 hours as needed -Continue with pain control with Oxycodone IR and Percocet -Will consult Palliative for Further GOC discussion -Will have Medical Oncology weigh in and now they are recommending hospice care. -Patient has been steadily declining and she has not been improving.  Her prognosis is very poor she is high risk for decompensation. -We will continue goals of care discussion and appreciate palliative care involvement.  Likely shoes to be transitioned to residential hospice  Hypokalemia -Improved. K+ is now 3.8 -Continue to monitor and replete as necessary -Repeat CMP in a.m if she is not going to go for comfort care and hospice care after goals of care discussion  Hypothyroidism -Check TSH and was 1.054 -Continue with levothyroxine 25 mcg p.o. daily before breakfast  Right Pleural Effusion -Underwent thoracentesis on 08/30/2019 -Repeat chest x-ray this a.m showed "Right IJ Port-A-Cath unchanged. Lungs are hypoinflated and continue to demonstrate opacification over the mid to lower left lung. Hazy opacification over the  right lung suggesting layering effusion with atelectasis. Cardiomediastinal silhouette and remainder of the exam is unchanged." -Continue to Monitor Respiratory Status carefully    Leukocytosis -Patient's WBC is worsened and gone from 8.4 is now 12.6 and in the setting of steroid demargination -If the patient is going to hospice care will not check WBC again in a.m.   Anemia of Dhronic Disease/Normocyctic Anemia -likely from underlying  malignancy -Hemoglobin/hematocrit went from 11.3/34.4 on admission is now 12.6/38.7 -Check anemia panel and showed an iron level of 67, U IBC of 230, TIBC of 297, saturation ratios of 23, ferritin level 516, folate level 17.0, vitamin B12 level 459 -Continue to monitor for signs and symptoms of bleeding; currently no overt bleeding noted -Repeat CBC in a.m if not going to Hospice care  Thrombocytosis -Likely reactive and worsened in the setting of Steroids -Platelet Count went from 526 -> 463 -> 556 -> 546 -> 649 -Continue to Monitor and Trend -Repeat CBC in AM if not going to [place] -Patients Sodium is now 130 and Chloride Level is 94 -Likely dropped in the setting of D5-1/2 NS at 75 mL/hr -Continue to Monitor and Trend and repeat in the AM if not going to [place]   Hyperglycemia in the setting of prediabetes -Elevated on Admission with a Blood Sugar of 151 -Now Daily Blood Sugars ranging from 113-151 on BMP/CMP -Check HbA1c and was 6.3.  Last A1c was 5.4 -Likely to worsen in the setting of Steroid Demargination -If necessary will place on Sensitive Novolog SSI AC  Metastatic Breast Cancer -Patient on chemotherapy per oncology; currently on Pembrolizumab 200 mg however oncology evaluated today and feels that she would not benefit from any additional systemic therapy -Continue with pain control with oxycodone-acetaminophen as well as oxycodone IR every 8 hours as needed for severe pain and continue tramadol 50 mg p.o. every 6 as needed for moderate pain -Appreciate further care per Oncology and they are now recommending hospice care; Radiation Oncology still following and currently  getting treatments and Dr. Lisbeth Renshaw to evaluate her today and decide if further continuation of radiation is necessary and warranted  -Have also consulted Palliative Care Medicine for further [place] and appreciate Dr. Kirstie Mirza evaluation of the patient.  Somnolence and Worsening confusion -Progressively getting worse -In the setting of Metastatic Disease to the brain -Continue to Monitor Carefully  -C/w Supportive Care  Elevated AST -Patient's AST is 43 -In the setting of Steroids -Continue to Monitor and Trend if she does not go to Hospice Care  DVT prophylaxis: SCDs Code Status: FULL CODE  Family Communication: No family present at bedside but spoke with the husband via the telephone yesterday Disposition Plan: Pending improvement in Dizziness and Nausea  Status is: Inpatient  Remains inpatient appropriate because:Continued Dizziness and now Somnolence   Dispo: The patient is from: Home              Anticipated d/c is to: Residential Hospice              Anticipated d/c date is: 2 days              Patient currently is not medically stable to d/c.  Consultants:   Medical Oncology  Radiation Oncology  Interventional Radiology  Palliative Care Medicine    Procedures:  U/S guided Diagnostic and Therapeutic R Thoracentesis yielding 700 mL of Yellow Fluid    Antimicrobials:  Anti-infectives (From admission, onward)   None     Subjective:  Seen and examined at bedside and she was more somnolent and confused today.  Wanted to rest.  Still felt dizzy and nauseous.  Had not been eating and did not eat dinner last night.  No other concerns or claims at this time.  Oncology now recommending hospice.  Will have palliative care for further goals of care discussion.  Objective: Vitals:   09/02/19 2320 09/03/19 0111 09/03/19 0410 09/03/19 0821  BP: (!) 170/107 (!) 164/101 (!) 168/103 (!) 164/100  Pulse: 100 99 97 (!) 103  Resp: 20   16  Temp: 98.4 F (36.9 C) 98.9 F (37.2 C)  97.6 F (36.4 C) 98.6 F (37 C)  TempSrc: Oral Oral Oral Oral  SpO2: 100% 99% 97% 100%  Weight:      Height:        Intake/Output Summary (Last 24 hours) at 09/03/2019 1336 Last data filed at 09/03/2019 0900 Gross per 24 hour  Intake 723.89 ml  Output 450 ml  Net 273.89 ml   Filed Weights   08/29/19 1459  Weight: 43.5 kg   Examination: Physical Exam:  Constitutional: Thin cachectic chronically ill-appearing Caucasian female who is somnolent and more confused today than she was yesterday.  Wanting to rest  Eyes: Lids and conjunctivae normal, sclerae anicteric  ENMT: External Ears, Nose appear normal. Grossly normal hearing.  Neck: Appears normal, supple, no cervical masses, normal ROM, no appreciable thyromegaly; no JVD Respiratory: Diminished to auscultation bilaterally, no wheezing, rales, rhonchi or crackles. Normal respiratory effort and patient is not tachypenic. No accessory muscle use. Unlabored breathing Cardiovascular: RRR, no murmurs / rubs / gallops. S1 and S2 auscultated.  Abdomen: Soft, non-tender, non-distended.. Bowel sounds positive.  GU: Deferred. Musculoskeletal: No clubbing / cyanosis of digits/nails. No joint deformity upper and lower extremities.  Skin: No rashes, lesions, ulcers on a limited skin evaluation. No induration; Warm and dry.  Neurologic: CN 2-12 grossly intact with no focal deficits but is somnolent and drowsy. Romberg sign and cerebellar reflexes not assessed.  Psychiatric: Impaired judgment and insight. Confused. Depressed appearing mood and appropriate affect.   Data Reviewed: I have personally reviewed following labs and imaging studies  CBC: Recent Labs  Lab 08/29/19 1745 08/29/19 1745 08/30/19 0100 08/31/19 0330 09/01/19 0452 09/02/19 0331 09/03/19 0505  WBC 6.2   < > 8.4 9.8 9.7 10.3 12.6*  NEUTROABS 4.5  --  7.3  --   --  8.8* 11.1*  HGB 11.3*   < > 11.3* 9.2* 9.5* 10.2* 12.6  HCT 34.7*   < > 34.4* 28.5* 29.4* 32.0* 38.7  MCV  96.7   < > 96.6 96.6 98.7 99.1 95.6  PLT 518*   < > 526* 463* 556* 546* 649*   < > = values in this interval not displayed.   Basic Metabolic Panel: Recent Labs  Lab 08/30/19 0100 08/31/19 0330 09/01/19 0452 09/02/19 0331 09/03/19 0505  NA 139 138 140 137 130*  K 3.3* 4.3 4.4 4.3 3.8  CL 102 102 103 100 94*  CO2 26 29 30 29 28   GLUCOSE 151* 113* 122* 120* 164*  BUN 9 16 19 21 17   CREATININE 0.80 0.88 0.85 0.71 0.58  CALCIUM 8.4* 8.5* 8.3* 8.3* 8.4*  MG  --   --   --  2.3 2.2  PHOS  --   --   --  4.2 3.2   GFR: Estimated Creatinine Clearance: 48.1 mL/min (by C-G formula based on SCr of 0.58 mg/dL). Liver Function Tests: Recent  Labs  Lab 08/29/19 1745 08/30/19 0100 09/02/19 0331 09/03/19 0505  AST 44* 38 39 43*  ALT 19 18 27 30   ALKPHOS 97 99 82 92  BILITOT 0.7 0.7 0.4 0.7  PROT 6.7 6.2* 5.6* 6.3*  ALBUMIN 3.3* 3.1* 2.9* 3.4*   No results for input(s): LIPASE, AMYLASE in the last 168 hours. No results for input(s): AMMONIA in the last 168 hours. Coagulation Profile: No results for input(s): INR, PROTIME in the last 168 hours. Cardiac Enzymes: No results for input(s): CKTOTAL, CKMB, CKMBINDEX, TROPONINI in the last 168 hours. BNP (last 3 results) No results for input(s): PROBNP in the last 8760 hours. HbA1C: Recent Labs    09/03/19 0505  HGBA1C 6.3*   CBG: No results for input(s): GLUCAP in the last 168 hours. Lipid Profile: No results for input(s): CHOL, HDL, LDLCALC, TRIG, CHOLHDL, LDLDIRECT in the last 72 hours. Thyroid Function Tests: Recent Labs    09/03/19 0505  TSH 1.054   Anemia Panel: Recent Labs    09/03/19 0505  VITAMINB12 459  FOLATE 17.0  FERRITIN 516*  TIBC 297  IRON 67  RETICCTPCT 1.7   Sepsis Labs: No results for input(s): PROCALCITON, LATICACIDVEN in the last 168 hours.  Recent Results (from the past 240 hour(s))  SARS Coronavirus 2 by RT PCR (hospital order, performed in [facility] hospital lab) Nasopharyngeal  Nasopharyngeal Swab     Status: None   Collection Time: 08/29/19  8:34 PM   Specimen: Nasopharyngeal Swab  Result Value Ref Range Status   SARS Coronavirus 2 NEGATIVE NEGATIVE Final    Comment: (NOTE) SARS-CoV-2 target nucleic acids are NOT DETECTED. The SARS-CoV-2 RNA is generally detectable in upper and lower respiratory specimens during the acute phase of infection. The lowest concentration of SARS-CoV-2 viral copies this assay can detect is 250 copies / mL. A negative result does not preclude SARS-CoV-2 infection and should not be used as the sole basis for treatment or other patient management decisions.  A negative result may occur with improper specimen collection / handling, submission of specimen other than nasopharyngeal swab, presence of viral mutation(s) within the areas targeted by this assay, and inadequate number of viral copies (<250 copies / mL). A negative result must be combined with clinical observations, patient history, and epidemiological information. Fact Sheet for Patients:   StrictlyIdeas.no Fact Sheet for Healthcare Providers: BankingDealers.co.za This test is not yet approved or cleared  by the [place] FDA and has been authorized for detection and/or diagnosis of SARS-CoV-2 by FDA under an Emergency Use Authorization (EUA).  This EUA will remain in effect (meaning this test can be used) for the duration of the COVID-19 declaration under Section 564(b)(1) of the Act, 21 U.S.C. section 360bbb-3(b)(1), unless the authorization is terminated or revoked sooner. Performed at [facility], [place] [address], [place], [place] [address]     RN Pressure Injury Documentation:     Estimated body mass index is 17.01 kg/m as calculated from the following:   Height as of this encounter: 5\' 3"  (1.6 m).   Weight as of this encounter: 43.5 kg.  Malnutrition Type:      Malnutrition  Characteristics:      Nutrition Interventions:    Radiology Studies: DG CHEST PORT 1 VIEW  Result Date: 09/02/2019 CLINICAL DATA:  Shortness of breath. History of metastatic breast cancer. EXAM: PORTABLE CHEST 1 VIEW COMPARISON:  08/30/2019 FINDINGS: Right IJ Port-A-Cath unchanged. Lungs are hypoinflated and continue to demonstrate opacification over the  mid to lower left lung. Hazy opacification over the right lung suggesting layering effusion with atelectasis. Cardiomediastinal silhouette and remainder of the exam is unchanged. IMPRESSION: Stable opacification over the left mid to lower lung likely effusion with associated compressive atelectasis. Suggestion of layering right pleural effusion/atelectasis. Electronically Signed   By: Marin Olp M.D.   On: 09/02/2019 08:04   Scheduled Meds: . Chlorhexidine Gluconate Cloth  6 each Topical Daily  . dexamethasone  4 mg Oral Q8H  . gabapentin  300 mg Oral BID  . gabapentin  300 mg Oral Once  . levothyroxine  25 mcg Oral QAC breakfast  . prednisoLONE acetate  1 drop Both Eyes Daily   Continuous Infusions: . dextrose 5 % and 0.45% NaCl 75 mL/hr at 09/03/19 0509     LOS: 5 days   Kerney Elbe, DO [facility] PAGER is on AMION  If 7PM-7AM, please contact night-coverage [email or web address]

## 2019-09-04 LAB — COMPREHENSIVE METABOLIC PANEL
ALT: 25 U/L (ref 0–44)
AST: 34 U/L (ref 15–41)
Albumin: 2.8 g/dL — ABNORMAL LOW (ref 3.5–5.0)
Alkaline Phosphatase: 72 U/L (ref 38–126)
Anion gap: 10 (ref 5–15)
BUN: 15 mg/dL (ref 8–23)
CO2: 26 mmol/L (ref 22–32)
Calcium: 7.7 mg/dL — ABNORMAL LOW (ref 8.9–10.3)
Chloride: 95 mmol/L — ABNORMAL LOW (ref 98–111)
Creatinine, Ser: 0.62 mg/dL (ref 0.44–1.00)
GFR calc Af Amer: >60 mL/min (ref >60)
GFR calc non Af Amer: >60 mL/min (ref >60)
Glucose, Bld: 407 mg/dL — ABNORMAL HIGH (ref 70–99)
Potassium: 3.6 mmol/L (ref 3.5–5.1)
Sodium: 131 mmol/L — ABNORMAL LOW (ref 135–145)
Total Bilirubin: 0.8 mg/dL (ref 0.3–1.2)
Total Protein: 5 g/dL — ABNORMAL LOW (ref 6.5–8.1)

## 2019-09-04 LAB — PHOSPHORUS: Phosphorus: 3 mg/dL (ref 2.5–4.6)

## 2019-09-04 LAB — CBC WITH DIFFERENTIAL/PLATELET
Abs Immature Granulocytes: 0.09 10*3/uL — ABNORMAL HIGH (ref 0.00–0.07)
Basophils Absolute: 0 10*3/uL (ref 0.0–0.1)
Basophils Relative: 0 %
Eosinophils Absolute: 0 10*3/uL (ref 0.0–0.5)
Eosinophils Relative: 0 %
HCT: 37.3 % (ref 36.0–46.0)
Hemoglobin: 12.4 g/dL (ref 12.0–15.0)
Immature Granulocytes: 1 %
Lymphocytes Relative: 4 %
Lymphs Abs: 0.4 10*3/uL — ABNORMAL LOW (ref 0.7–4.0)
MCH: 31.2 pg (ref 26.0–34.0)
MCHC: 33.2 g/dL (ref 30.0–36.0)
MCV: 94 fL (ref 80.0–100.0)
Monocytes Absolute: 0.7 10*3/uL (ref 0.1–1.0)
Monocytes Relative: 7 %
Neutro Abs: 9 10*3/uL — ABNORMAL HIGH (ref 1.7–7.7)
Neutrophils Relative %: 88 %
Platelets: 641 10*3/uL — ABNORMAL HIGH (ref 150–400)
RBC: 3.97 MIL/uL (ref 3.87–5.11)
RDW: 14.5 % (ref 11.5–15.5)
WBC: 10.2 10*3/uL (ref 4.0–10.5)
nRBC: 0 % (ref 0.0–0.2)

## 2019-09-04 LAB — MAGNESIUM: Magnesium: 2 mg/dL (ref 1.7–2.4)

## 2019-09-04 LAB — GLUCOSE, CAPILLARY: Glucose-Capillary: 144 mg/dL — ABNORMAL HIGH (ref 70–99)

## 2019-09-04 NOTE — Plan of Care (Signed)
  Problem: Education: Goal: Knowledge of General Education information will improve Description Including pain rating scale, medication(s)/side effects and non-pharmacologic comfort measures Outcome: Progressing   Problem: Clinical Measurements: Goal: Ability to maintain clinical measurements within normal limits will improve Outcome: Progressing   Problem: Clinical Measurements: Goal: Will remain free from infection Outcome: Progressing   Problem: Clinical Measurements: Goal: Cardiovascular complication will be avoided Outcome: Progressing   

## 2019-09-04 NOTE — Progress Notes (Signed)
PROGRESS NOTE    Elizabeth [name]  [id number] DOB: [birth year] DOA: 08/29/2019 PCP: [name], PA  Brief Narrative:  The patient is a 64 year old thin chronically ill-appearing Caucasian female with a past medical history significant for but not limited to metastatic left sided breast cancer, hypothyroidism, hypercholesterolemia, history of migraines, vertigo as well as other comorbidities who presented with a chief complaint of dizziness for the last 3 to 4 days with frequent falls.  She denied losing any consciousness however she presented to the ED for these chief complaint and further work-up was done and she had a head CT which showed metastatic lesions and an MRI of the brain which showed multiple metastatic lesions mostly in the cerebellum with mass-effect.  Patient was started on IV Decadron and medical oncology was consulted.  Subsequently radiation oncology was notified and she also went thoracentesis for a right pleural effusion.  Radiation oncology evaluated and felt that she was a candidate for brain radiotherapy was subsequently proceeding with systemic therapy under the care of Dr. Ladona Mow when she has completed radiotherapy.  Radiation oncology recommending a course of 10 fractions with the possibility of their time salvage radiosurgery if needed to the largest lesion in the cerebellum. Today is Day 4/10 of Radiation.  Local oncology has evaluated the patient and now they are recommending hospice care as they believe that any additional systemic therapy would not be of any benefit. Dr. Lindi Adie is to speak with Dr. Lisbeth Renshaw and they will decide if further radiation is warranted. Palliative care has been consulted for goals of care discussion likely if they are agreeable will transition to residential hospice.  After discussion with the care team Dr. Lisbeth Renshaw wants to see how the patient will do this week and if she had no significant improvement then the plan will be for residential hospice early  next week.  Assessment & Plan:   Principal Problem:   Brain metastasis ([place]) Active Problems:   Malignant neoplasm of lower-outer quadrant of left breast of female, estrogen receptor positive (HCC)   Pleural effusion   Vasogenic brain edema (HCC)  Dizziness 2/2 Metastatic brain lesions with at least 50 brain Metastasis -Patient has vasogenic edema with mass-effect seen on the MRI brain.   -Started on IV Decadron 8 mg 3 times daily and dose reduced to 4 mg every 8 hours  and will continue for now -Patient was seen by radiation oncology.  Plan for whole brain radiation again.  Patient will need at least 10 treatments and later radiosurgery for large lesion in cerebellum and systemic chemotherapy.   -C/w Supportive Care and Treatment -C/w antiemetics with prochlorperazine 10 mg every 6 hours as needed for nausea and vomiting as well as Zofran 4 mg p.o./IV every 6 hours as needed -Continue with pain control with Oxycodone IR and Percocet -Will consult Palliative for Further GOC discussion -Will have Medical Oncology weigh in and now they are recommending hospice care. -Patient has been steadily declining and she has not been improving and continues to be somnolent and dizzy.  Her prognosis is very poor she is high risk for decompensation. -We will continue goals of care discussion and appreciate palliative care involvement.  Likely she is to be transitioned to residential hospice as she has not made much improvement from yesterday to today and actually seems about the same -CODE STATUS is now been addressed and she is now DO NOT RESUSCITATE  Hypokalemia -Improved. K+ is now 3.6 -Continue to monitor and replete as necessary -  Repeat CMP in a.m as she is not fully transition to comfort care or residential hospice just yet  Hypothyroidism -Check TSH and was 1.054 -Continue with levothyroxine 25 mcg p.o. daily before breakfast  Right Pleural Effusion -Underwent thoracentesis on  08/30/2019 -Repeat chest x-ray this a.m showed "Right IJ Port-A-Cath unchanged. Lungs are hypoinflated and continue to demonstrate opacification over the mid to lower left lung. Hazy opacification over the right lung suggesting layering effusion with atelectasis. Cardiomediastinal silhouette and remainder of the exam is unchanged." -Continue to Monitor Respiratory Status carefully   Leukocytosis -Patient's WBC is worsened and gone from 8.4 is now 12.6 and in the setting of steroid demargination -Her WBC now is 10.2 -Continue to monitor and trend repeat CBC in a.m.  Anemia of Dhronic Disease/Normocyctic Anemia -likely from underlying  malignancy -Hemoglobin/hematocrit went from 11.3/34.4 on admission is now 12.4/37.3 -Check anemia panel and showed an iron level of 67, U IBC of 230, TIBC of 297, saturation ratios of 23, ferritin level 516, folate level 17.0, vitamin B12 level 459 -Continue to monitor for signs and symptoms of bleeding; currently no overt bleeding noted -Repeat CBC in a.m if not going to Hospice care  Thrombocytosis -Likely reactive and worsened in the setting of Steroids and her illness -Platelet Count went from 526 -> 463 -> 556 -> 546 -> 649 -> 641 -Continue to Monitor and Trend -Repeat CBC in AM if not going to [place] -Patients Sodium is now 131 and Chloride Level is 95 -Likely dropped in the setting of D5-1/2 NS at 75 mL/hr which is being continued as she is not eating much and further declining  -Continue to Monitor and Trend and repeat in the AM as she is not going to [place] just yet   Hyperglycemia in the setting of prediabetes -Elevated on Admission with a Blood Sugar of 151 -Now Daily Blood Sugars ranging from 113-407 on BMP/CMP; CBGs ranging from 120-144 -Check HbA1c and was 6.3.  Last A1c was 5.4 -Likely to worsen in the setting of Steroid Demargination -If necessary will place on Sensitive Novolog SSI AC  Metastatic  Breast Cancer -Patient on chemotherapy per oncology; currently on Pembrolizumab 200 mg however oncology evaluated today and feels that she would not benefit from any additional systemic therapy -Continue with pain control with oxycodone-acetaminophen as well as oxycodone IR every 8 hours as needed for severe pain and continue tramadol 50 mg p.o. every 6 as needed for moderate pain -Appreciate further care per Oncology and they are now recommending hospice care; Radiation Oncology still following and currently getting treatments and Dr. Lisbeth Renshaw wants to evaluate the patient throughout the weekend to see if she improves any but if she does not then the plan will be to get her to residential hospice early next week -Have also consulted Palliative Care Medicine for further [place] and appreciate Dr. Kirstie Mirza evaluation of the patient.  Somnolence and Worsening confusion, persistent -Progressively getting worse and she is about the same as yesterday -In the setting of Metastatic Disease to the brain -Continue to Monitor Carefully  -C/w Supportive Care  Elevated AST -Patient's AST is 43 and now improved to 34 -In the setting of Steroids -Continue to Monitor and Trend if she does not go to Hospice Care  DVT prophylaxis: SCDs Code Status: DO NOT RESUSCITATE now Family Communication: No family present at bedside but spoke with the husband via the telephone yesterday Disposition Plan: Pending improvement in Dizziness and Nausea  Status is:  Inpatient  Remains inpatient appropriate because:Continued Dizziness and now Somnolence   Dispo: The patient is from: Home              Anticipated d/c is likley to: Residential Hospice              Anticipated d/c date is: 2 days              Patient currently is not medically stable to d/c.  Consultants:   Medical Oncology  Radiation Oncology  Interventional Radiology  Palliative Care Medicine    Procedures:  U/S guided Diagnostic and Therapeutic R  Thoracentesis yielding 700 mL of Yellow Fluid    Antimicrobials:  Anti-infectives (From admission, onward)   None     Subjective: Seen and examined at bedside and she remained drowsy and a little confused but still complaining of some nausea and states that she is not and not able to eat.  Complains of some abdominal pain as well.  Wanting to rest and had her eyes closed the entire time.  No other concerns or complaints at this time and just does not feel well.  Objective: Vitals:   09/03/19 0821 09/03/19 1400 09/03/19 1948 09/04/19 0542  BP: (!) 164/100 (!) 139/97 (!) 151/98 (!) 141/101  Pulse: (!) 103 (!) 106 (!) 106 (!) 108  Resp: 16 16 14 18   Temp: 98.6 F (37 C) 97.8 F (36.6 C) 98.4 F (36.9 C) (!) 97.4 F (36.3 C)  TempSrc: Oral Oral Oral Oral  SpO2: 100% 100% 100% 99%  Weight:      Height:        Intake/Output Summary (Last 24 hours) at 09/04/2019 1333 Last data filed at 09/04/2019 1254 Gross per 24 hour  Intake 1731.64 ml  Output 1700 ml  Net 31.64 ml   Filed Weights   08/29/19 1459  Weight: 43.5 kg   Examination: Physical Exam:  Constitutional: The patient is a thin cachectic chronically ill-appearing Caucasian female who is continued to be low confused and somnolent as well as drowsy.  She is wanting to rest and does not open her eyes during the encounter but she does answer questions and does seem a little groggy Eyes: Lids and conjunctivae normal, sclerae anicteric  ENMT: External Ears, Nose appear normal. Grossly normal hearing. Neck: Appears normal, supple, no cervical masses, normal ROM, no appreciable thyromegaly; no JVD Respiratory: Diminished to auscultation bilaterally, no wheezing, rales, rhonchi or crackles. Normal respiratory effort and patient is not tachypenic. No accessory muscle use.  Unlabored breathing Cardiovascular: RRR, no murmurs / rubs / gallops. S1 and S2 auscultated.  Abdomen: Soft, non-tender, non-distended. Bowel sounds positive.    GU: Deferred. Musculoskeletal: No clubbing / cyanosis of digits/nails. No joint deformity upper and lower extremities.  Skin: No rashes, lesions, ulcers on limited skin evaluation. No induration; Warm and dry.  Neurologic: CN 2-12 grossly intact with no focal deficits. Romberg sign cerebellar reflexes not assessed.  Psychiatric: Impaired judgment and insight.  She is not alert and oriented x 3 and does appear to be extremely drowsy and somnolent and does appear confused.Marland Kitchen  Appears to have a depressed mood and appropriate affect.   Data Reviewed: I have personally reviewed following labs and imaging studies  CBC: Recent Labs  Lab 08/29/19 1745 08/29/19 1745 08/30/19 0100 08/30/19 0100 08/31/19 0330 09/01/19 0452 09/02/19 0331 09/03/19 0505 09/04/19 0500  WBC 6.2   < > 8.4   < > 9.8 9.7 10.3 12.6* 10.2  NEUTROABS 4.5  --  7.3  --   --   --  8.8* 11.1* 9.0*  HGB 11.3*   < > 11.3*   < > 9.2* 9.5* 10.2* 12.6 12.4  HCT 34.7*   < > 34.4*   < > 28.5* 29.4* 32.0* 38.7 37.3  MCV 96.7   < > 96.6   < > 96.6 98.7 99.1 95.6 94.0  PLT 518*   < > 526*   < > 463* 556* 546* 649* 641*   < > = values in this interval not displayed.   Basic Metabolic Panel: Recent Labs  Lab 08/31/19 0330 09/01/19 0452 09/02/19 0331 09/03/19 0505 09/04/19 0500  NA 138 140 137 130* 131*  K 4.3 4.4 4.3 3.8 3.6  CL 102 103 100 94* 95*  CO2 29 30 29 28 26   GLUCOSE 113* 122* 120* 164* 407*  BUN 16 19 21 17 15   CREATININE 0.88 0.85 0.71 0.58 0.62  CALCIUM 8.5* 8.3* 8.3* 8.4* 7.7*  MG  --   --  2.3 2.2 2.0  PHOS  --   --  4.2 3.2 3.0   GFR: Estimated Creatinine Clearance: 48.1 mL/min (by C-G formula based on SCr of 0.62 mg/dL). Liver Function Tests: Recent Labs  Lab 08/29/19 1745 08/30/19 0100 09/02/19 0331 09/03/19 0505 09/04/19 0500  AST 44* 38 39 43* 34  ALT 19 18 27 30 25   ALKPHOS 97 99 82 92 72  BILITOT 0.7 0.7 0.4 0.7 0.8  PROT 6.7 6.2* 5.6* 6.3* 5.0*  ALBUMIN 3.3* 3.1* 2.9* 3.4* 2.8*   No  results for input(s): LIPASE, AMYLASE in the last 168 hours. No results for input(s): AMMONIA in the last 168 hours. Coagulation Profile: No results for input(s): INR, PROTIME in the last 168 hours. Cardiac Enzymes: No results for input(s): CKTOTAL, CKMB, CKMBINDEX, TROPONINI in the last 168 hours. BNP (last 3 results) No results for input(s): PROBNP in the last 8760 hours. HbA1C: Recent Labs    09/03/19 0505  HGBA1C 6.3*   CBG: Recent Labs  Lab 09/03/19 1951 09/04/19 1140  GLUCAP 128* 144*   Lipid Profile: No results for input(s): CHOL, HDL, LDLCALC, TRIG, CHOLHDL, LDLDIRECT in the last 72 hours. Thyroid Function Tests: Recent Labs    09/03/19 0505  TSH 1.054   Anemia Panel: Recent Labs    09/03/19 0505  VITAMINB12 459  FOLATE 17.0  FERRITIN 516*  TIBC 297  IRON 67  RETICCTPCT 1.7   Sepsis Labs: No results for input(s): PROCALCITON, LATICACIDVEN in the last 168 hours.  Recent Results (from the past 240 hour(s))  SARS Coronavirus 2 by RT PCR (hospital order, performed in [facility] hospital lab) Nasopharyngeal Nasopharyngeal Swab     Status: None   Collection Time: 08/29/19  8:34 PM   Specimen: Nasopharyngeal Swab  Result Value Ref Range Status   SARS Coronavirus 2 NEGATIVE NEGATIVE Final    Comment: (NOTE) SARS-CoV-2 target nucleic acids are NOT DETECTED. The SARS-CoV-2 RNA is generally detectable in upper and lower respiratory specimens during the acute phase of infection. The lowest concentration of SARS-CoV-2 viral copies this assay can detect is 250 copies / mL. A negative result does not preclude SARS-CoV-2 infection and should not be used as the sole basis for treatment or other patient management decisions.  A negative result may occur with improper specimen collection / handling, submission of specimen other than nasopharyngeal swab, presence of viral mutation(s) within the areas targeted by this assay, and inadequate number of viral copies (<250  copies / mL). A negative result must be combined with clinical observations, patient history, and epidemiological information. Fact Sheet for Patients:   StrictlyIdeas.no Fact Sheet for Healthcare Providers: BankingDealers.co.za This test is not yet approved or cleared  by the [place] FDA and has been authorized for detection and/or diagnosis of SARS-CoV-2 by FDA under an Emergency Use Authorization (EUA).  This EUA will remain in effect (meaning this test can be used) for the duration of the COVID-19 declaration under Section 564(b)(1) of the Act, 21 U.S.C. section 360bbb-3(b)(1), unless the authorization is terminated or revoked sooner. Performed at [facility], [place] [address], [place], [place] [address]     RN Pressure Injury Documentation:     Estimated body mass index is 17.01 kg/m as calculated from the following:   Height as of this encounter: 5\' 3"  (1.6 m).   Weight as of this encounter: 43.5 kg.  Malnutrition Type:      Malnutrition Characteristics:      Nutrition Interventions:    Radiology Studies: No results found. Scheduled Meds: . Chlorhexidine Gluconate Cloth  6 each Topical Daily  . dexamethasone  4 mg Oral Q8H  . gabapentin  300 mg Oral BID  . gabapentin  300 mg Oral Once  . levothyroxine  25 mcg Oral QAC breakfast  . prednisoLONE acetate  1 drop Both Eyes Daily   Continuous Infusions: . dextrose 5 % and 0.45% NaCl 75 mL/hr at 09/04/19 0929     LOS: 6 days   Kerney Elbe, DO [facility] PAGER is on AMION  If 7PM-7AM, please contact night-coverage [email or web address]

## 2019-09-04 NOTE — Progress Notes (Signed)
Daily Progress Note   Patient Name: Elizabeth [name]       Date: 09/04/2019 DOB: 1955-09-25  Age: 64 y.o. MRN#: [id number] Attending Physician: Kerney Elbe, DO Primary Care Physician: [name], PA Admit Date: 08/29/2019  Reason for Consultation/Follow-up: Establishing goals of care  Subjective: I saw and examined Ms. Elzey today.  She is still largely somnolent, but she was able to answer a few simple questions with 1-2 words.  Her daughters are present at the bedside and report that she has been having periods where she has been more interactive today.  Still a very significant change from her baseline, but better than she was yesterday.    She denies any pain or SOB currently.  I discussed with her daughters plan for continuation of current care and continuing to assess how she does this weekend.  Discussed plan for reevaluation Monday with consideration for transition to hospice depending on her clinical course over the weekend.  Length of Stay: 6  Current Medications: Scheduled Meds:  . Chlorhexidine Gluconate Cloth  6 each Topical Daily  . dexamethasone  4 mg Oral Q8H  . gabapentin  300 mg Oral BID  . gabapentin  300 mg Oral Once  . levothyroxine  25 mcg Oral QAC breakfast  . prednisoLONE acetate  1 drop Both Eyes Daily    Continuous Infusions: . dextrose 5 % and 0.45% NaCl 75 mL/hr at 09/04/19 2028    PRN Meds: lip balm, LORazepam, ondansetron **OR** ondansetron (ZOFRAN) IV, oxyCODONE-acetaminophen **AND** oxyCODONE, prochlorperazine, sodium chloride flush, traMADol  Physical Exam         General: Eyes closed.  Chronically ill appearing. Heart: Regular rate and rhythm. No murmur appreciated. Lungs: Decreased air movement, clear Abdomen: Soft, nontender,  nondistended, positive bowel sounds.  Ext: Some edema Skin: Warm and dry  Vital Signs: BP 98/72 (BP Location: Right Arm)   Pulse 95   Temp 98.2 F (36.8 C)   Resp 18   Ht 5\' 3"  (1.6 m)   Wt 43.5 kg   SpO2 99%   BMI 17.01 kg/m  SpO2: SpO2: 99 % O2 Device: O2 Device: Nasal Cannula O2 Flow Rate: O2 Flow Rate (L/min): 2 L/min  Intake/output summary:   Intake/Output Summary (Last 24 hours) at 09/04/2019 2258 Last data filed at 09/04/2019 2000 Gross  per 24 hour  Intake 845.1 ml  Output 2700 ml  Net -1854.9 ml   LBM: Last BM Date: 08/31/19 Baseline Weight: Weight: 43.5 kg Most recent weight: Weight: 43.5 kg       Palliative Assessment/Data:      Patient Active Problem List   Diagnosis Date Noted  . Pleural effusion   . Vasogenic brain edema ([place])   . Brain metastasis ([place]) 08/29/2019  . Malignant neoplasm of lower-outer quadrant of left breast of female, estrogen receptor positive ([place]) 10/21/2018  . Goals of care, counseling/discussion 10/21/2018  . Bone metastasis ([place]) 12/22/2017  . Primary osteoarthritis of left hip 05/26/2017  . Osteoarthritis of left hip 05/23/2017  . Breast cancer of lower-outer quadrant of left female breast ([place]) 08/06/2012    Palliative Care Assessment & Plan   Patient Profile: 64 year old female with metastatic breast cancer with newly discovered brain mets (currently getting radiation therapy) with continued functional decline.  She is deemed not a candidate for further systemic therapy.  Recommendations/Plan:  Discussed today with patients daughters.  Plan is to continue to follow how she does this weekend and make further decisions based upon her clinical course through the weekend.  She is still very lethargic, but she does decline pain or SOB.  Code Status:    Code Status Orders  (From admission, onward)         Start     Ordered   09/03/19 1552  Do not attempt resuscitation (DNR)  Continuous    Question Answer Comment    In the event of cardiac or respiratory ARREST Do not call a "code blue"   In the event of cardiac or respiratory ARREST Do not perform Intubation, CPR, defibrillation or ACLS   In the event of cardiac or respiratory ARREST Use medication by any route, position, wound care, and other measures to relive pain and suffering. May use oxygen, suction and manual treatment of airway obstruction as needed for comfort.      09/03/19 1551        Code Status History    Date Active Date Inactive Code Status Order ID Comments User Context   08/29/2019 2242 09/03/2019 1551 Full Code [id number]  Rise Patience, MD ED   05/26/2017 1021 05/28/2017 1500 Full Code [id number]  Leighton Parody, PA-C Inpatient   Advance Care Planning Activity    Advance Directive Documentation     Most Recent Value  Type of Advance Directive  Healthcare Power of Attorney  Pre-existing out of facility DNR order (yellow form or pink MOST form)  --  "MOST" Form in Place?  --       Prognosis:   Guarded  Discharge Planning:  To Be Determined- ? Residential hospice if no improvement over the weekend  Care plan was discussed with daughters  Thank you for allowing the Palliative Medicine Team to assist in the care of this patient.   Time In: 1130 Time Out: 1200 Total Time 30 Prolonged Time Billed  No      Greater than 50%  of this time was spent counseling and coordinating care related to the above assessment and plan.  Micheline Rough, MD  Please contact Palliative Medicine Team phone at [phone] for questions and concerns.

## 2019-09-04 NOTE — Progress Notes (Signed)
OT Cancellation Note  Patient Details Name: Elizabeth [name] MRN: [id number] DOB: 1955/11/28   Cancelled Treatment:    Reason Eval/Treat Not Completed: Other (comment)(Family potentially pursuing hospice and comfort care. Patient sleeping well at this time. Will hold eval and f/u with family and GOC more solidified.)  [name] L Kla Bily 09/04/2019, 4:09 PM

## 2019-09-05 ENCOUNTER — Inpatient Hospital Stay (HOSPITAL_COMMUNITY): Payer: Medicare Other

## 2019-09-05 DIAGNOSIS — R110 Nausea: Secondary | ICD-10-CM

## 2019-09-05 DIAGNOSIS — C799 Secondary malignant neoplasm of unspecified site: Secondary | ICD-10-CM

## 2019-09-05 DIAGNOSIS — C50919 Malignant neoplasm of unspecified site of unspecified female breast: Secondary | ICD-10-CM

## 2019-09-05 DIAGNOSIS — G893 Neoplasm related pain (acute) (chronic): Secondary | ICD-10-CM

## 2019-09-05 LAB — MAGNESIUM: Magnesium: 2.2 mg/dL (ref 1.7–2.4)

## 2019-09-05 LAB — CBC WITH DIFFERENTIAL/PLATELET
Abs Immature Granulocytes: 0.06 10*3/uL (ref 0.00–0.07)
Basophils Absolute: 0 10*3/uL (ref 0.0–0.1)
Basophils Relative: 0 %
Eosinophils Absolute: 0 10*3/uL (ref 0.0–0.5)
Eosinophils Relative: 0 %
HCT: 34.6 % — ABNORMAL LOW (ref 36.0–46.0)
Hemoglobin: 11.2 g/dL — ABNORMAL LOW (ref 12.0–15.0)
Immature Granulocytes: 1 %
Lymphocytes Relative: 4 %
Lymphs Abs: 0.3 10*3/uL — ABNORMAL LOW (ref 0.7–4.0)
MCH: 31.4 pg (ref 26.0–34.0)
MCHC: 32.4 g/dL (ref 30.0–36.0)
MCV: 96.9 fL (ref 80.0–100.0)
Monocytes Absolute: 0.5 10*3/uL (ref 0.1–1.0)
Monocytes Relative: 6 %
Neutro Abs: 6.7 10*3/uL (ref 1.7–7.7)
Neutrophils Relative %: 89 %
Platelets: 577 10*3/uL — ABNORMAL HIGH (ref 150–400)
RBC: 3.57 MIL/uL — ABNORMAL LOW (ref 3.87–5.11)
RDW: 14.6 % (ref 11.5–15.5)
WBC: 7.6 10*3/uL (ref 4.0–10.5)
nRBC: 0 % (ref 0.0–0.2)

## 2019-09-05 LAB — COMPREHENSIVE METABOLIC PANEL
ALT: 33 U/L (ref 0–44)
AST: 35 U/L (ref 15–41)
Albumin: 2.7 g/dL — ABNORMAL LOW (ref 3.5–5.0)
Alkaline Phosphatase: 74 U/L (ref 38–126)
Anion gap: 9 (ref 5–15)
BUN: 16 mg/dL (ref 8–23)
CO2: 27 mmol/L (ref 22–32)
Calcium: 8 mg/dL — ABNORMAL LOW (ref 8.9–10.3)
Chloride: 99 mmol/L (ref 98–111)
Creatinine, Ser: 0.6 mg/dL (ref 0.44–1.00)
GFR calc Af Amer: >60 mL/min (ref >60)
GFR calc non Af Amer: >60 mL/min (ref >60)
Glucose, Bld: 143 mg/dL — ABNORMAL HIGH (ref 70–99)
Potassium: 3.5 mmol/L (ref 3.5–5.1)
Sodium: 135 mmol/L (ref 135–145)
Total Bilirubin: 0.5 mg/dL (ref 0.3–1.2)
Total Protein: 5.2 g/dL — ABNORMAL LOW (ref 6.5–8.1)

## 2019-09-05 LAB — PHOSPHORUS: Phosphorus: 2.9 mg/dL (ref 2.5–4.6)

## 2019-09-05 MED ORDER — HYDROMORPHONE HCL 1 MG/ML IJ SOLN
0.5000 mg | INTRAMUSCULAR | Status: DC | PRN
  Administered 2019-09-05 – 2019-09-15 (×18): 0.5 mg via INTRAVENOUS
  Filled 2019-09-05 (×20): qty 0.5

## 2019-09-05 MED ORDER — POTASSIUM CHLORIDE 10 MEQ/100ML IV SOLN
10.0000 meq | INTRAVENOUS | Status: AC
  Administered 2019-09-05 (×4): 10 meq via INTRAVENOUS
  Filled 2019-09-05 (×4): qty 100

## 2019-09-05 MED ORDER — METOCLOPRAMIDE HCL 5 MG/ML IJ SOLN
5.0000 mg | Freq: Four times a day (QID) | INTRAMUSCULAR | Status: DC
  Administered 2019-09-05 – 2019-09-15 (×39): 5 mg via INTRAVENOUS
  Filled 2019-09-05 (×39): qty 2

## 2019-09-05 NOTE — Progress Notes (Signed)
OT Cancellation Note  Patient Details Name: [name] MRN: [id number] DOB: 09/09/1955   Cancelled Treatment:    Reason Eval/Treat Not Completed: Patient declined, no reason specified Patient's daughters in room and agreeable for therapy to ask patient if she wants to participate. Daughter's want their mother to participate if she is willing. However, patient declined without stating a reason.  Lenward Chancellor 09/05/2019, 3:38 PM

## 2019-09-05 NOTE — Progress Notes (Signed)
PROGRESS NOTE    Elizabeth [name]  [id number] DOB: [birth year] DOA: 08/29/2019 PCP: [name], PA  Brief Narrative:  The patient is a 64 year old thin chronically ill-appearing Caucasian female with a past medical history significant for but not limited to metastatic left sided breast cancer, hypothyroidism, hypercholesterolemia, history of migraines, vertigo as well as other comorbidities who presented with a chief complaint of dizziness for the last 3 to 4 days with frequent falls.  She denied losing any consciousness however she presented to the ED for these chief complaint and further work-up was done and she had a head CT which showed metastatic lesions and an MRI of the brain which showed multiple metastatic lesions mostly in the cerebellum with mass-effect.  Patient was started on IV Decadron and medical oncology was consulted.  Subsequently radiation oncology was notified and she also went thoracentesis for a right pleural effusion.  Radiation oncology evaluated and felt that she was a candidate for brain radiotherapy was subsequently proceeding with systemic therapy under the care of Dr. Ladona Mow when she has completed radiotherapy.  Radiation oncology recommending a course of 10 fractions with the possibility of their time salvage radiosurgery if needed to the largest lesion in the cerebellum. Underwent Radiation on Friday.  Medical oncology has evaluated the patient and now they are recommending hospice care as they believe that any additional systemic therapy would not be of any benefit. Dr. Lindi Adie is to speak with Dr. Lisbeth Renshaw and they will decide if further radiation is warranted. Palliative care has been consulted for goals of care discussion likely if they are agreeable will transition to residential hospice.  After discussion with the care team Dr. Lisbeth Renshaw wants to see how the patient will do this week and if she had no significant improvement then the plan will be for residential hospice  early next week.  She still is not feeling as well and not taking much p.o.  Complaining of left-sided pain today.  Assessment & Plan:   Principal Problem:   Brain metastasis ([place]) Active Problems:   Malignant neoplasm of lower-outer quadrant of left breast of female, estrogen receptor positive (HCC)   Pleural effusion   Vasogenic brain edema (HCC)  Dizziness 2/2 Metastatic brain lesions with at least 50 brain Metastasis -Patient has vasogenic edema with mass-effect seen on the MRI brain.   -Started on IV Decadron 8 mg 3 times daily and dose reduced to 4 mg every 8 hours  and will continue for now; she remains persistently dizzy especially when she moves -Patient was seen by radiation oncology.  Plan for whole brain radiation again.  Patient will need at least 10 treatments and later radiosurgery for large lesion in cerebellum and systemic chemotherapy.   -C/w Supportive Care and Treatment -C/w antiemetics with prochlorperazine 10 mg every 6 hours as needed for nausea and vomiting as well as Zofran 4 mg p.o./IV every 6 hours as needed; continues to have some nausea -Continue with pain control with Oxycodone IR and Percocet -Will consult Palliative for Further GOC discussion -Will have Medical Oncology weigh in and now they are recommending hospice care. -Patient has been steadily declining and she has not been improving and continues to be somnolent and dizzy.  She is more alert today but still wanted to rest and close her eyes during the entire conversation.  Her prognosis is very poor she is high risk for decompensation. -We will continue goals of care discussion and appreciate palliative care involvement.  Likely she is to  be transitioned to residential hospice as she has not made much significant improvement -CODE STATUS is now been addressed and she is now DO NOT RESUSCITATE  Hypokalemia -Improved. K+ is now 3.5 -We will replete with IV KCl 40 mEQ -Continue to monitor and replete as  necessary -Repeat CMP in a.m as she is not fully transition to comfort care or residential hospice just yet  Hypothyroidism -Check TSH and was 1.054 -Continue with levothyroxine 25 mcg p.o. daily before breakfast  Right Pleural Effusion -Underwent thoracentesis on 08/30/2019 -Repeat chest x-ray this a.m 09/05/19 showed "Post treatment chest with stable pleural effusions that obscure the left more than right lung." -Continue to Monitor Respiratory Status carefully   Leukocytosis -Patient's WBC had worsened but has now trended down and is now 7.6 -Her WBC peaked at 12.6 -Continue to monitor and trend repeat CBC in a.m.  Anemia of Dhronic Disease/Normocyctic Anemia -likely from underlying  malignancy -Hemoglobin/hematocrit went from 11.3/34.4 on admission is now 11.2/34.6 -Check anemia panel and showed an iron level of 67, U IBC of 230, TIBC of 297, saturation ratios of 23, ferritin level 516, folate level 17.0, vitamin B12 level 459 -Continue to monitor for signs and symptoms of bleeding; currently no overt bleeding noted -Repeat CBC in a.m if not going to Hospice care  Thrombocytosis -Likely reactive and worsened in the setting of Steroids and her illness -Platelet Count went from 526 -> 463 -> 556 -> 546 -> 649 -> 641 -> 577 -Continue to Monitor and Trend -Repeat CBC in AM if not going to [place], improved -Patients Sodium is now 135 and Chloride Level is 95 -Initially felt that it dropped in the setting of D5-1/2 NS at 75 mL/hr which is being continued as she is not eating much and further declining  -Continue to Monitor and Trend and repeat in the AM as she is not going to [place] just yet   Hyperglycemia in the setting of prediabetes -Elevated on Admission with a Blood Sugar of 151 -Now Daily Blood Sugars ranging from 113-407 on BMP/CMP; CBGs ranging from 128-144 -Check HbA1c and was 6.3.  Last A1c was 5.4 -Likely to worsen in the setting  of Steroid Demargination -If necessary will place on Sensitive Novolog SSI AC  Metastatic Breast Cancer -Patient on chemotherapy per oncology; currently on Pembrolizumab 200 mg however oncology evaluated today and feels that she would not benefit from any additional systemic therapy -Continue with pain control with oxycodone-acetaminophen as well as oxycodone IR every 8 hours as needed for severe pain and continue tramadol 50 mg p.o. every 6 as needed for moderate pain -Appreciate further care per Oncology and they are now recommending hospice care; Radiation Oncology still following and currently getting treatments and Dr. Lisbeth Renshaw wants to evaluate the patient throughout the weekend to see if she improves any but if she does not then the plan will be to get her to residential hospice early next week -Have also consulted Palliative Care Medicine for further [place] and appreciate Dr. Kirstie Mirza evaluation of the patient.  Somnolence and Worsening confusion, persistent -Is a little better than yesterday  -In the setting of Metastatic Disease to the brain -Continue to Monitor Carefully  -C/w Supportive Care  Elevated AST -Patient's AST is 43 and now improved to 35 -In the setting of Steroids -Continue to Monitor and Trend if she does not go to Hospice Care  DVT prophylaxis: SCDs Code Status: DO NOT RESUSCITATE now Family Communication: No family present  at bedside Disposition Plan: Pending improvement in Dizziness and Nausea however may likely be Hospice Care given her lack of improvement   Status is: Inpatient  Remains inpatient appropriate because:Continued Dizziness and now Somnolence   Dispo: The patient is from: Home              Anticipated d/c is likley to: Residential Hospice              Anticipated d/c date is: 2 days              Patient currently is not medically stable to d/c.  Consultants:   Medical Oncology  Radiation Oncology  Interventional Radiology  Palliative  Care Medicine    Procedures:  U/S guided Diagnostic and Therapeutic R Thoracentesis yielding 700 mL of Yellow Fluid    Antimicrobials:  Anti-infectives (From admission, onward)   None     Subjective: Seen and examined at bedside and she is resting but still complaining some nausea and complain of some left-sided abdominal pain.  States that she remains nauseous and she states that she remains dizzy and does not want to eat.  States the dizziness is worse when she tries to move.  No other concerns or claims at this time.  Objective: Vitals:   09/04/19 0542 09/04/19 1721 09/04/19 2112 09/05/19 0547  BP: (!) 141/101 120/80 98/72 (!) 131/92  Pulse: (!) 108 94 95 98  Resp: 18 14 18 15   Temp: (!) 97.4 F (36.3 C) (!) 97.5 F (36.4 C) 98.2 F (36.8 C) (!) 97.5 F (36.4 C)  TempSrc: Oral Oral  Oral  SpO2: 99% 100% 99% 99%  Weight:      Height:        Intake/Output Summary (Last 24 hours) at 09/05/2019 1205 Last data filed at 09/05/2019 0600 Gross per 24 hour  Intake 230 ml  Output 2200 ml  Net -1970 ml   Filed Weights   08/29/19 1459  Weight: 43.5 kg   Examination: Physical Exam:  Constitutional: The patient is a thin cachectic chronically ill-appearing Caucasian female who is more awake today however it remains somnolent and drowsy and does not feel well still.  Complaining of some nausea and is not eating very well.  Also complained of some left-sided pain. Eyes: Lids and conjunctivae normal, sclerae anicteric  ENMT: External Ears, Nose appear normal. Grossly normal hearing. Neck: Appears normal, supple, no cervical masses, normal ROM, no appreciable thyromegaly; no JVD Respiratory: Diminished to auscultation bilaterally, no wheezing, rales, rhonchi or crackles. Normal respiratory effort and patient is not tachypenic. No accessory muscle use.  Unlabored breathing Cardiovascular: RRR, no murmurs / rubs / gallops. S1 and S2 auscultated.  Abdomen: Soft, non-tender,  non-distended. Bowel sounds positive.  GU: Deferred. Musculoskeletal: No clubbing / cyanosis of digits/nails. No joint deformity upper and lower extremities.  Skin: No rashes, lesions, ulcers on a limited skin evaluation. No induration; Warm and dry.  Neurologic: CN 2-12 grossly intact with no focal deficits. Romberg sign cerebellar reflexes not assessed.  Psychiatric: Impaired judgment and insight. Still a little sleepy but is more awake today. Depressed appearing mood and affect.   Data Reviewed: I have personally reviewed following labs and imaging studies  CBC: Recent Labs  Lab 08/30/19 0100 08/31/19 0330 09/01/19 0452 09/02/19 0331 09/03/19 0505 09/04/19 0500 09/05/19 0500  WBC 8.4   < > 9.7 10.3 12.6* 10.2 7.6  NEUTROABS 7.3  --   --  8.8* 11.1* 9.0* 6.7  HGB 11.3*   < >  9.5* 10.2* 12.6 12.4 11.2*  HCT 34.4*   < > 29.4* 32.0* 38.7 37.3 34.6*  MCV 96.6   < > 98.7 99.1 95.6 94.0 96.9  PLT 526*   < > 556* 546* 649* 641* 577*   < > = values in this interval not displayed.   Basic Metabolic Panel: Recent Labs  Lab 09/01/19 0452 09/02/19 0331 09/03/19 0505 09/04/19 0500 09/05/19 0500  NA 140 137 130* 131* 135  K 4.4 4.3 3.8 3.6 3.5  CL 103 100 94* 95* 99  CO2 30 29 28 26 27   GLUCOSE 122* 120* 164* 407* 143*  BUN 19 21 17 15 16   CREATININE 0.85 0.71 0.58 0.62 0.60  CALCIUM 8.3* 8.3* 8.4* 7.7* 8.0*  MG  --  2.3 2.2 2.0 2.2  PHOS  --  4.2 3.2 3.0 2.9   GFR: Estimated Creatinine Clearance: 48.1 mL/min (by C-G formula based on SCr of 0.6 mg/dL). Liver Function Tests: Recent Labs  Lab 08/30/19 0100 09/02/19 0331 09/03/19 0505 09/04/19 0500 09/05/19 0500  AST 38 39 43* 34 35  ALT 18 27 30 25  33  ALKPHOS 99 82 92 72 74  BILITOT 0.7 0.4 0.7 0.8 0.5  PROT 6.2* 5.6* 6.3* 5.0* 5.2*  ALBUMIN 3.1* 2.9* 3.4* 2.8* 2.7*   No results for input(s): LIPASE, AMYLASE in the last 168 hours. No results for input(s): AMMONIA in the last 168 hours. Coagulation Profile: No  results for input(s): INR, PROTIME in the last 168 hours. Cardiac Enzymes: No results for input(s): CKTOTAL, CKMB, CKMBINDEX, TROPONINI in the last 168 hours. BNP (last 3 results) No results for input(s): PROBNP in the last 8760 hours. HbA1C: Recent Labs    09/03/19 0505  HGBA1C 6.3*   CBG: Recent Labs  Lab 09/03/19 1951 09/04/19 1140  GLUCAP 128* 144*   Lipid Profile: No results for input(s): CHOL, HDL, LDLCALC, TRIG, CHOLHDL, LDLDIRECT in the last 72 hours. Thyroid Function Tests: Recent Labs    09/03/19 0505  TSH 1.054   Anemia Panel: Recent Labs    09/03/19 0505  VITAMINB12 459  FOLATE 17.0  FERRITIN 516*  TIBC 297  IRON 67  RETICCTPCT 1.7   Sepsis Labs: No results for input(s): PROCALCITON, LATICACIDVEN in the last 168 hours.  Recent Results (from the past 240 hour(s))  SARS Coronavirus 2 by RT PCR (hospital order, performed in [facility] hospital lab) Nasopharyngeal Nasopharyngeal Swab     Status: None   Collection Time: 08/29/19  8:34 PM   Specimen: Nasopharyngeal Swab  Result Value Ref Range Status   SARS Coronavirus 2 NEGATIVE NEGATIVE Final    Comment: (NOTE) SARS-CoV-2 target nucleic acids are NOT DETECTED. The SARS-CoV-2 RNA is generally detectable in upper and lower respiratory specimens during the acute phase of infection. The lowest concentration of SARS-CoV-2 viral copies this assay can detect is 250 copies / mL. A negative result does not preclude SARS-CoV-2 infection and should not be used as the sole basis for treatment or other patient management decisions.  A negative result may occur with improper specimen collection / handling, submission of specimen other than nasopharyngeal swab, presence of viral mutation(s) within the areas targeted by this assay, and inadequate number of viral copies (<250 copies / mL). A negative result must be combined with clinical observations, patient history, and epidemiological information. Fact Sheet for  Patients:   StrictlyIdeas.no Fact Sheet for Healthcare Providers: BankingDealers.co.za This test is not yet approved or cleared  by the [place] FDA  and has been authorized for detection and/or diagnosis of SARS-CoV-2 by FDA under an Emergency Use Authorization (EUA).  This EUA will remain in effect (meaning this test can be used) for the duration of the COVID-19 declaration under Section 564(b)(1) of the Act, 21 U.S.C. section 360bbb-3(b)(1), unless the authorization is terminated or revoked sooner. Performed at [facility], [place] [address], [place], [place] [address]     RN Pressure Injury Documentation:     Estimated body mass index is 17.01 kg/m as calculated from the following:   Height as of this encounter: 5\' 3"  (1.6 m).   Weight as of this encounter: 43.5 kg.  Malnutrition Type:      Malnutrition Characteristics:      Nutrition Interventions:    Radiology Studies: DG CHEST PORT 1 VIEW  Result Date: 09/05/2019 CLINICAL DATA:  Shortness of breath EXAM: PORTABLE CHEST 1 VIEW COMPARISON:  Three days ago FINDINGS: Port with tip at the upper right atrium. No suspected cardiomegaly with left heart partially obscured. Hazy bilateral chest from pleural fluid. There is also volume loss and pulmonary opacity on the left more than right. There has been left-sided lung surgery. No visible pneumothorax. IMPRESSION: Post treatment chest with stable pleural effusions that obscure the left more than right lung. Electronically Signed   By: Monte Fantasia M.D.   On: 09/05/2019 07:34   Scheduled Meds: . Chlorhexidine Gluconate Cloth  6 each Topical Daily  . dexamethasone  4 mg Oral Q8H  . gabapentin  300 mg Oral BID  . gabapentin  300 mg Oral Once  . levothyroxine  25 mcg Oral QAC breakfast  . prednisoLONE acetate  1 drop Both Eyes Daily   Continuous Infusions: . dextrose 5 % and 0.45% NaCl 75 mL/hr at  09/04/19 2028     LOS: 7 days   Kerney Elbe, DO [facility] PAGER is on AMION  If 7PM-7AM, please contact night-coverage [email or web address]

## 2019-09-05 NOTE — Plan of Care (Signed)
  Problem: Coping: Goal: Level of anxiety will decrease Outcome: Progressing   Problem: Pain Managment: Goal: General experience of comfort will improve Outcome: Progressing   

## 2019-09-05 NOTE — Evaluation (Addendum)
Occupational Therapy Evaluation Patient Details Name: Elizabeth [name] MRN: [id number] DOB: 03-Apr-1956 Today's Date: 09/05/2019    History of Present Illness 64 year old thin chronically ill-appearing Caucasian female with a past medical history significant for but not limited to metastatic left sided breast cancer, hypothyroidism, hypercholesterolemia, history of migraines, vertigo as well as other comorbidities who presented with a chief complaint of dizziness for the last 3 to 4 days with frequent falls.  She denied losing any consciousness however she presented to the ED for these chief complaint and further work-up was done and she had a head CT which showed metastatic lesions and an MRI of the brain which showed multiple metastatic lesions mostly in the cerebellum with mass-effect.  Patient was started on IV Decadron and medical oncology was consulted.  Subsequently radiation oncology was notified and she also went thoracentesis for a right pleural effusion.  Radiation oncology evaluated and felt that she was a candidate for brain radiotherapy was subsequently proceeding with systemic therapy under the care of Dr. Ladona Mow when she has completed radiotherapy.  Radiation oncology recommending a course of 10 fractions with the possibility of their time salvage radiosurgery if needed to the largest lesion in the cerebellum.   Clinical Impression   Elizabeth [name] is a 64 year old woman who presents with left sided breast cancer with mets to the brain. On evaluation patient lethargic and presents with generalized weakness and poor activity tolerance.Patient maintaining a slumped position during evaluation, head down and eyes closed. Patient mod assist for stand pivot to [facility] and assistance of two for safety, equipment and clothing management for toileting. Patient predominantly total assist at bed level during this admission. Patient and family verbalize they want therapy to treat patient to improve her  functional abilities - when she is agreeable. Patient will benefit from skilled OT services to improve deficits and improve functional abilities in order to reduce caregiver burden.      Follow Up Recommendations  Home health OT;Other (comment)(Hospice. Unsure of [place]. Patient and family want therapist to continue to try with patient.)    Equipment Recommendations       Recommendations for Other Services       Precautions / Restrictions Precautions Precautions: Fall Restrictions Weight Bearing Restrictions: No      Mobility Bed Mobility               General bed mobility comments: Max assist for assistance with LEs and trunk negotiation.  Transfers       Sit to Stand: Mod assist Stand pivot transfers: +2 safety/equipment       General transfer comment: Patient's arms around therapist and head on therapit shoulder during transfer to [facility].    Balance Overall balance assessment: Needs assistance Sitting-balance support: Feet supported;Bilateral upper extremity supported Sitting balance-Leahy Scale: Poor     Standing balance support: Bilateral upper extremity supported Standing balance-Leahy Scale: Poor                             ADL either performed or assessed with clinical judgement   ADL Overall ADL's : Needs assistance/impaired Eating/Feeding: Maximal assistance;Bed level Eating/Feeding Details (indicate cue type and reason): Family feeding patinet Grooming: Maximal assistance;Bed level   Upper Body Bathing: Total assistance;Bed level   Lower Body Bathing: Total assistance;Bed level   Upper Body Dressing : Maximal assistance;Bed level   Lower Body Dressing: Total assistance;Bed level   Toilet Transfer: +2 for safety/equipment;Moderate  assistance;BSC;Stand-pivot Toilet Transfer Details (indicate cue type and reason): patient mod assist to stand pivot to recliner. Patient's daughter there to assist with steadying BSC and guiding patient's  hips. Toileting- Clothing Manipulation and Hygiene: Total assistance;+2 for physical assistance;Sit to/from Nurse, children's Details (indicate cue type and reason): unable Functional mobility during ADLs: +2 for safety/equipment;+2 for physical assistance General ADL Comments: stand pivot     Vision         Perception     Praxis      Pertinent Vitals/Pain Pain Assessment: No/denies pain     Hand Dominance     Extremity/Trunk Assessment Upper Extremity Assessment Upper Extremity Assessment: Generalized weakness           Communication Communication Communication: No difficulties   Cognition Arousal/Alertness: Lethargic   Overall Cognitive Status: Difficult to assess                                 General Comments: Limited engagmenet due to lethargy to determine cognition   General Comments       Exercises     Shoulder Instructions      Home Living Family/patient expects to be discharged to:: Private residence Living Arrangements: Spouse/significant other   Type of Home: House Home Access: Stairs to enter Technical brewer of Steps: 3 Entrance Stairs-Rails: None Home Layout: Able to live on main level with bedroom/bathroom               Home Equipment: None          Prior Functioning/Environment Level of Independence: Independent                 OT Problem List: Decreased strength;Decreased activity tolerance;Impaired balance (sitting and/or standing);Decreased safety awareness;Decreased knowledge of use of DME or AE      OT Treatment/Interventions: Self-care/ADL training;Therapeutic exercise;Energy conservation;DME and/or AE instruction;Therapeutic activities;Patient/family education    OT Goals(Current goals can be found in the care plan section) Acute Rehab OT Goals Patient Stated Goal: to go to bathroom OT Goal Formulation: With patient Time For Goal Achievement: 09/19/19 Potential to Achieve  Goals: Fair ADL Goals Pt Will Perform Grooming: with set-up;bed level Pt Will Perform Upper Body Dressing: sitting;with set-up Pt Will Perform Lower Body Dressing: with mod assist;sit to/from stand;sitting/lateral leans;with set-up Pt Will Transfer to Toilet: with min assist;stand pivot transfer;bedside commode Pt Will Perform Toileting - Clothing Manipulation and hygiene: with min assist;sit to/from stand Pt/caregiver will Perform Home Exercise Program: Increased strength;Both right and left upper extremity;With theraband  OT Frequency: Min 2X/week   Barriers to D/C:            Co-evaluation              AM-PAC OT "6 Clicks" Daily Activity     Outcome Measure Help from another person eating meals?: A Lot Help from another person taking care of personal grooming?: A Lot Help from another person toileting, which includes using toliet, bedpan, or urinal?: Total Help from another person bathing (including washing, rinsing, drying)?: Total Help from another person to put on and taking off regular upper body clothing?: A Lot Help from another person to put on and taking off regular lower body clothing?: Total 6 Click Score: 9   End of Session Equipment Utilized During Treatment: Gait belt Nurse Communication: Mobility status  Activity Tolerance: Patient limited by lethargy Patient left: in chair;with family/visitor  present  OT Visit Diagnosis: Unsteadiness on feet (R26.81);Muscle weakness (generalized) (M62.81);Adult, failure to thrive (R62.7)                Time: [id number] OT Time Calculation (min): 15 min Charges:  OT General Charges $OT Visit: 1 Visit OT Evaluation $OT Eval Low Complexity: 1 Low  Jessie Schrieber, OTR/L Acute Care Rehab Services  Office [phone]   Lenward Chancellor 09/05/2019, 5:21 PM

## 2019-09-05 NOTE — Progress Notes (Signed)
Daily Progress Note   Patient Name: Elizabeth [name]       Date: 09/05/2019 DOB: 1956-02-22  Age: 64 y.o. MRN#: [id number] Attending Physician: Kerney Elbe, DO Primary Care Physician: [name], PA Admit Date: 08/29/2019  Reason for Consultation/Follow-up: Establishing goals of care  Subjective: I saw and examined Elizabeth [name] today.  She was lying in bed with eyes closed.  Daughter is at the bedside.  She did seem to be able to answer questions more readily today.  Yesterday questions were limited to one-word answers.  Today she was still very brief with answers, but answered in full sentences.  Reports that she has been having pain more on her left side.  Had received Percocet with some effect, but still states that she is hurting currently.  She also reports she has been having nausea and her daughter reports that she ate 2 bites of food this morning and then stated she was full.  Discussed current nausea regimen, however, she does not think to ask for medication reports she is nauseous most of the day.  Length of Stay: 7  Current Medications: Scheduled Meds:  . Chlorhexidine Gluconate Cloth  6 each Topical Daily  . dexamethasone  4 mg Oral Q8H  . gabapentin  300 mg Oral BID  . gabapentin  300 mg Oral Once  . levothyroxine  25 mcg Oral QAC breakfast  . metoCLOPramide (REGLAN) injection  5 mg Intravenous Q6H  . prednisoLONE acetate  1 drop Both Eyes Daily    Continuous Infusions: . dextrose 5 % and 0.45% NaCl 75 mL/hr at 09/04/19 2028  . potassium chloride 10 mEq (09/05/19 1411)    PRN Meds: HYDROmorphone (DILAUDID) injection, lip balm, LORazepam, ondansetron **OR** ondansetron (ZOFRAN) IV, oxyCODONE-acetaminophen **AND** oxyCODONE, sodium chloride flush  Physical Exam          General: Eyes closed.  Chronically ill appearing. Heart: Regular rate and rhythm. No murmur appreciated. Lungs: Decreased air movement, clear Abdomen: Soft, nontender, nondistended, positive bowel sounds.  Ext: Some edema Skin: Warm and dry  Vital Signs: BP 116/72 (BP Location: Left Arm)   Pulse 82   Temp 97.9 F (36.6 C) (Oral)   Resp 15   Ht 5\' 3"  (1.6 m)   Wt 43.5 kg   SpO2 97%   BMI 17.01 kg/m  SpO2: SpO2: 97 % O2 Device: O2 Device: Nasal Cannula O2 Flow Rate: O2 Flow Rate (L/min): 2 L/min  Intake/output summary:   Intake/Output Summary (Last 24 hours) at 09/05/2019 1505 Last data filed at 09/05/2019 0600 Gross per 24 hour  Intake 110 ml  Output 1600 ml  Net -1490 ml   LBM: Last BM Date: 08/31/19 Baseline Weight: Weight: 43.5 kg Most recent weight: Weight: 43.5 kg       Palliative Assessment/Data:      Patient Active Problem List   Diagnosis Date Noted  . Pleural effusion   . Vasogenic brain edema ([place])   . Brain metastasis ([place]) 08/29/2019  . Malignant neoplasm of lower-outer quadrant of left breast of female, estrogen receptor positive ([place]) 10/21/2018  . Goals of care, counseling/discussion 10/21/2018  . Bone metastasis ([place]) 12/22/2017  . Primary osteoarthritis of left hip 05/26/2017  . Osteoarthritis of left hip 05/23/2017  . Breast cancer of lower-outer quadrant of left female breast ([place]) 08/06/2012    Palliative Care Assessment & Plan   Patient Profile: 64 year old female with metastatic breast cancer with newly discovered brain mets (currently getting radiation therapy) with continued functional decline.  She is deemed not a candidate for further systemic therapy.  Recommendations/Plan:  Discussed again with daughters today.  Plan is to see how she is doing tomorrow and make further determination about next steps and goals of care at that time.  Her husband Elizabeth [name] be here throughout the day tomorrow.  I did not set a specific time his  family is hopeful Dr. Lisbeth Renshaw can weigh in prior to our conversation.  Pain: Has some pain on the side of her chest.  Reports small improvement with Percocet.  We Elizabeth [name] plan for trial of Dilaudid on an as-needed basis to see if it better controls her pain.  I also DC tramadol.  Nausea: Reports continuous nausea and early satiety.  Has not really been thinking to ask for medication.  Continue Zofran as needed.  We Elizabeth [name] also start trial of Reglan every 6 hours to see if she feels better with this regimen.  Code Status:    Code Status Orders  (From admission, onward)         Start     Ordered   09/03/19 1552  Do not attempt resuscitation (DNR)  Continuous    Question Answer Comment  In the event of cardiac or respiratory ARREST Do not call a "code blue"   In the event of cardiac or respiratory ARREST Do not perform Intubation, CPR, defibrillation or ACLS   In the event of cardiac or respiratory ARREST Use medication by any route, position, wound care, and other measures to relive pain and suffering. May use oxygen, suction and manual treatment of airway obstruction as needed for comfort.      09/03/19 1551        Code Status History    Date Active Date Inactive Code Status Order ID Comments User Context   08/29/2019 2242 09/03/2019 1551 Full Code [id number]  Rise Patience, MD ED   05/26/2017 1021 05/28/2017 1500 Full Code [id number]  Leighton Parody, PA-C Inpatient   Advance Care Planning Activity    Advance Directive Documentation     Most Recent Value  Type of Advance Directive  Healthcare Power of Attorney  Pre-existing out of facility DNR order (yellow form or pink MOST form)  -  "MOST" Form in Place?  -       Prognosis:  Guarded  Discharge Planning:  To Be Determined- ? Residential hospice if no improvement over the weekend  Care plan was discussed with daughters  Thank you for allowing the Palliative Medicine Team to assist in the care of this patient.   Time  In: 1320 Time Out: 1350 Total Time 30 Prolonged Time Billed  No      Greater than 50%  of this time was spent counseling and coordinating care related to the above assessment and plan.  Micheline Rough, MD  Please contact Palliative Medicine Team phone at [phone] for questions and concerns.

## 2019-09-06 ENCOUNTER — Telehealth: Payer: Self-pay | Admitting: Radiation Oncology

## 2019-09-06 ENCOUNTER — Ambulatory Visit
Admit: 2019-09-06 | Discharge: 2019-09-06 | Disposition: A | Discharge status: Home / Self Care | Payer: Medicare Other | Attending: Radiation Oncology | Admitting: Radiation Oncology

## 2019-09-06 LAB — COMPREHENSIVE METABOLIC PANEL
ALT: 29 U/L (ref 0–44)
AST: 33 U/L (ref 15–41)
Albumin: 2.8 g/dL — ABNORMAL LOW (ref 3.5–5.0)
Alkaline Phosphatase: 75 U/L (ref 38–126)
Anion gap: 6 (ref 5–15)
BUN: 14 mg/dL (ref 8–23)
CO2: 27 mmol/L (ref 22–32)
Calcium: 8 mg/dL — ABNORMAL LOW (ref 8.9–10.3)
Chloride: 102 mmol/L (ref 98–111)
Creatinine, Ser: 0.66 mg/dL (ref 0.44–1.00)
GFR calc Af Amer: >60 mL/min (ref >60)
GFR calc non Af Amer: >60 mL/min (ref >60)
Glucose, Bld: 149 mg/dL — ABNORMAL HIGH (ref 70–99)
Potassium: 4 mmol/L (ref 3.5–5.1)
Sodium: 135 mmol/L (ref 135–145)
Total Bilirubin: 0.2 mg/dL — ABNORMAL LOW (ref 0.3–1.2)
Total Protein: 5.2 g/dL — ABNORMAL LOW (ref 6.5–8.1)

## 2019-09-06 LAB — CBC WITH DIFFERENTIAL/PLATELET
Abs Immature Granulocytes: 0.11 10*3/uL — ABNORMAL HIGH (ref 0.00–0.07)
Basophils Absolute: 0 10*3/uL (ref 0.0–0.1)
Basophils Relative: 0 %
Eosinophils Absolute: 0 10*3/uL (ref 0.0–0.5)
Eosinophils Relative: 0 %
HCT: 32.7 % — ABNORMAL LOW (ref 36.0–46.0)
Hemoglobin: 10.6 g/dL — ABNORMAL LOW (ref 12.0–15.0)
Immature Granulocytes: 1 %
Lymphocytes Relative: 4 %
Lymphs Abs: 0.4 10*3/uL — ABNORMAL LOW (ref 0.7–4.0)
MCH: 32.1 pg (ref 26.0–34.0)
MCHC: 32.4 g/dL (ref 30.0–36.0)
MCV: 99.1 fL (ref 80.0–100.0)
Monocytes Absolute: 0.8 10*3/uL (ref 0.1–1.0)
Monocytes Relative: 7 %
Neutro Abs: 9.9 10*3/uL — ABNORMAL HIGH (ref 1.7–7.7)
Neutrophils Relative %: 88 %
Platelets: 521 10*3/uL — ABNORMAL HIGH (ref 150–400)
RBC: 3.3 MIL/uL — ABNORMAL LOW (ref 3.87–5.11)
RDW: 14.9 % (ref 11.5–15.5)
WBC: 11.1 10*3/uL — ABNORMAL HIGH (ref 4.0–10.5)
nRBC: 0 % (ref 0.0–0.2)

## 2019-09-06 LAB — PHOSPHORUS: Phosphorus: 2.6 mg/dL (ref 2.5–4.6)

## 2019-09-06 LAB — MAGNESIUM: Magnesium: 2.2 mg/dL (ref 1.7–2.4)

## 2019-09-06 NOTE — Progress Notes (Signed)
Daily Progress Note   Patient Name: Elizabeth [name]       Date: 09/06/2019 DOB: 23-Nov-1955  Age: 64 y.o. MRN#: [id number] Attending Physician: Kerney Elbe, DO Primary Care Physician: Elizabeth Odor, PA Admit Date: 08/29/2019  Reason for Consultation/Follow-up: Establishing goals of care  Subjective: I saw and talked briefly with Elizabeth [name] today.  She was certainly more interactive and was sitting in bed getting ready to work with physical therapy.  She reports that her pain has been better controlled since changing her regimen.  She also states that her nausea has been better since starting scheduled Reglan.  She woke up hungry this morning and had some applesauce.  She also had ordered bacon but the smell made her nauseous and she was full after eating applesauce.  I met with her husband outside of the room.  Elizabeth [name] and I discussed her clinical course this weekend and that she has had improvement in her cognition from when I had seen her on Friday.  PT is working with her and appreciate their assessment of her functional status.  I do remain concerned that her nutrition is poor.  Elizabeth [name] reports that he is " guardedly optimistic" based upon her improvement over the weekend.  He feels as though she would want to continue with radiation with continued assessment of her clinical course over the next couple of days.  If she continues to improve, he would also want Dr. Lindi Adie to weigh in again regarding care plan.  Elizabeth [name] reports trying to be very thoughtful about next steps and wanting to " do what is right for her versus letting my hope drive our decisions."  I talked with him regarding changes over the weekend.  I think that it is very reasonable to continue with current interventions to see how she  continues to progress.  We talked at length about how to determine if she is improving and I recommended to continue focusing on her nutrition, cognition, and functional status over the next couple of days as determinants of how she is progressing.  I expressed that I am very concerned about nutrition moving forward and would not recommend consideration for any forms of artificial nutrition if she does not reach a point where she is able to take enough nutrition and hydration to maintain herself.  Family is very clear and understanding that Elizabeth [name] has a terminal disease, however, they also want to be thoughtful and decisions to make sure that we have explored all opportunities to add as much time in quality to her life as possible.  We have discussed residential hospice placement on Friday, and while family is not opposed to consideration for this in the future, they feel that we need to have a little bit more time to see how her clinical course progresses before making determination about next steps moving forward.  Length of Stay: 8  Current Medications: Scheduled Meds:  . Chlorhexidine Gluconate Cloth  6 each Topical Daily  . dexamethasone  4 mg Oral Q8H  . gabapentin  300 mg Oral BID  . gabapentin  300 mg Oral Once  . levothyroxine  25 mcg Oral QAC breakfast  . metoCLOPramide (REGLAN) injection  5 mg Intravenous Q6H  . prednisoLONE acetate  1 drop Both Eyes Daily    Continuous Infusions: . dextrose 5 % and 0.45% NaCl 75 mL/hr at 09/06/19 0302    PRN Meds: HYDROmorphone (DILAUDID) injection, lip balm, LORazepam, ondansetron **OR** ondansetron (ZOFRAN) IV, oxyCODONE-acetaminophen **AND** oxyCODONE, sodium chloride flush  Physical Exam         Limited exam as patient was working with physical therapy.   She was more awake and engaged in conversation today.   Sitting up in bed with physical therapy assistance and preparing to engage in physical therapy  Vital Signs: BP 124/80 (BP  Location: Right Arm)   Pulse 98   Temp (!) 97.3 F (36.3 C) (Oral)   Resp 18   Ht '5\' 3"'  (1.6 m)   Wt 43.5 kg   SpO2 98%   BMI 17.01 kg/m  SpO2: SpO2: 98 % O2 Device: O2 Device: Room Air O2 Flow Rate: O2 Flow Rate (L/min): 2 L/min  Intake/output summary:   Intake/Output Summary (Last 24 hours) at 09/06/2019 1228 Last data filed at 09/06/2019 0600 Gross per 24 hour  Intake --  Output 700 ml  Net -700 ml   LBM: Last BM Date: 08/31/19 Baseline Weight: Weight: 43.5 kg Most recent weight: Weight: 43.5 kg   Patient Active Problem List   Diagnosis Date Noted  . Pleural effusion   . Vasogenic brain edema ([place])   . Brain metastasis ([place]) 08/29/2019  . Malignant neoplasm of lower-outer quadrant of left breast of female, estrogen receptor positive ([place]) 10/21/2018  . Goals of care, counseling/discussion 10/21/2018  . Bone metastasis ([place]) 12/22/2017  . Primary osteoarthritis of left hip 05/26/2017  . Osteoarthritis of left hip 05/23/2017  . Breast cancer of lower-outer quadrant of left female breast ([place]) 08/06/2012    Palliative Care Assessment & Plan   Patient Profile: 64 year old female with metastatic breast cancer with newly discovered brain mets (currently getting radiation therapy) with continued functional decline.  She is deemed not a candidate for further systemic therapy.  Recommendations/Plan: -Pain: Reports that she has been having better pain control with current regimen.  Continue oxycodone p.o. or Dilaudid IV as needed for breakthrough pain -Nausea: Reports she is feeling better since starting on scheduled Reglan.  Recommend continue same. -Continue current interventions.  She has certainly shown improvement over the weekend.  Discussed with husband using nutrition, cognition, and functional status as indicators to how she is doing overall and would recommend monitoring changes in these as we continue to determine if she is benefiting from current interventions.  He  is hopeful that  she will continue to improve, however, he and family are very realistic that she has terminal disease and may continue to decline at any point. -Recommend reassessment after continuation of current interventions in another 24-48 hours.  If she reaches point where she is ready for discharge, I would certainly recommend palliative care to follow her at next venue if she does not discharge with hospice services.  Code Status:    Code Status Orders  (From admission, onward)         Start     Ordered   09/03/19 1552  Do not attempt resuscitation (DNR)  Continuous    Question Answer Comment  In the event of cardiac or respiratory ARREST Do not call a "code blue"   In the event of cardiac or respiratory ARREST Do not perform Intubation, CPR, defibrillation or ACLS   In the event of cardiac or respiratory ARREST Use medication by any route, position, wound care, and other measures to relive pain and suffering. May use oxygen, suction and manual treatment of airway obstruction as needed for comfort.      09/03/19 1551        Code Status History    Date Active Date Inactive Code Status Order ID Comments User Context   08/29/2019 2242 09/03/2019 1551 Full Code [id number]  Rise Patience, MD ED   05/26/2017 1021 05/28/2017 1500 Full Code [id number]  Leighton Parody, PA-C Inpatient   Advance Care Planning Activity    Advance Directive Documentation     Most Recent Value  Type of Advance Directive  Healthcare Power of Attorney  Pre-existing out of facility DNR order (yellow form or pink MOST form)  --  "MOST" Form in Place?  --       Prognosis:   Guarded  Discharge Planning:  To Be Determined.  Consideration was for residential hospice, however, she has shown improvement over the weekend.  Will likely need more time to determine her clinical trajectory before making plans for discharge.  Care plan was discussed with patient and husband  Thank you for allowing the  Palliative Medicine Team to assist in the care of this patient.   Time In: 1130 Time Out: 1220 Total Time 50 Prolonged Time Billed  No      Greater than 50%  of this time was spent counseling and coordinating care related to the above assessment and plan.  Micheline Rough, MD  Please contact Palliative Medicine Team phone at [phone] for questions and concerns.

## 2019-09-06 NOTE — Progress Notes (Signed)
PROGRESS NOTE    Elizabeth [name]  [id number] DOB: Aug 05, 1955 DOA: 08/29/2019 PCP: [name], PA  Brief Narrative:  The patient is a 64 year old thin chronically ill-appearing Caucasian female with a past medical history significant for but not limited to metastatic left sided breast cancer, hypothyroidism, hypercholesterolemia, history of migraines, vertigo as well as other comorbidities who presented with a chief complaint of dizziness for the last 3 to 4 days with frequent falls.  She denied losing any consciousness however she presented to the ED for these chief complaint and further work-up was done and she had a head CT which showed metastatic lesions and an MRI of the brain which showed multiple metastatic lesions mostly in the cerebellum with mass-effect.  Patient was started on IV Decadron and medical oncology was consulted.  Subsequently radiation oncology was notified and she also went thoracentesis for a right pleural effusion.  Radiation oncology evaluated and felt that she was a candidate for brain radiotherapy was subsequently proceeding with systemic therapy under the care of Dr. Ladona Mow when she has completed radiotherapy.  Radiation oncology recommending a course of 10 fractions with the possibility of their time salvage radiosurgery if needed to the largest lesion in the cerebellum. Underwent Radiation on Friday.  Medical oncology has evaluated the patient and now they are recommending hospice care as they believe that any additional systemic therapy would not be of any benefit. Dr. Lindi Adie is to speak with Dr. Lisbeth Renshaw and they will decide if further radiation is warranted. Palliative care has been consulted for goals of care discussion likely if they are agreeable will transition to residential hospice.  After discussion with the care team Dr. Lisbeth Renshaw wants to see how the patient will do this week and if she had no significant improvement then the plan will be for residential hospice  early next week but today she is much improved compared to the weekend and she is actually hungry.  Continues to be dizzy and nauseous but states that she is doing little bit better and still complaining of left-sided pain.   Assessment & Plan:   Principal Problem:   Brain metastasis ([place]) Active Problems:   Malignant neoplasm of lower-outer quadrant of left breast of female, estrogen receptor positive (HCC)   Pleural effusion   Vasogenic brain edema (HCC)  Dizziness 2/2 Metastatic brain lesions with at least 50 brain Metastasis -Patient has vasogenic edema with mass-effect seen on the MRI brain.   -Started on IV Decadron 8 mg 3 times daily and dose reduced to 4 mg every 8 hours  and will continue for now; she remains persistently dizzy especially when she moves -Patient was seen by radiation oncology.  Plan for whole brain radiation again.  Patient will need at least 10 treatments and later radiosurgery for large lesion in cerebellum and systemic chemotherapy.  She underwent radiation therapy again today given her improvement  -C/w Supportive Care and Treatment -C/w antiemetics with prochlorperazine 10 mg every 6 hours as needed for nausea and vomiting as well as Zofran 4 mg p.o./IV every 6 hours as needed; continues to have some nausea but is better after Reglan scheduled has been added -Continue with pain control with Oxycodone IR and Percocet -Consulted Palliative for Further GOC discussion -Will have Medical Oncology weigh in and now they are recommending hospice care. -Patient has been steadily declining and she has not been improving and continues to be somnolent and dizzy.  She is more alert today but still wanted to rest and  close her eyes during the entire conversation.  Her prognosis is very poor she is high risk for decompensation. -We will continue goals of care discussion and appreciate palliative care involvement.  She did not make any significant improvement this week and however  today she is much more awake and alert and she feels that her nausea is improved with the scheduled Reglan.  She reports that her pain is little bit better with scheduled Dilaudid.  We will continue to monitor her status and reassess for continuation of her current interventions.  Dr. Domingo Cocking recommends having palliative care follow-up at her next venue if she does not go with hospice services -CODE STATUS is now been addressed and she is now DO NOT RESUSCITATE  Hypokalemia -Improved. K+ is now 4.0 -Continue to monitor and replete as necessary -Repeat CMP in a.m as she is not fully transition to comfort care or residential hospice just yet  Hypothyroidism -Check TSH and was 1.054 -Continue with levothyroxine 25 mcg p.o. daily before breakfast  Right Pleural Effusion -Underwent thoracentesis on 08/30/2019 -Repeat chest x-ray 09/05/19 showed "Post treatment chest with stable pleural effusions that obscure the left more than right lung." -Continue to Monitor Respiratory Status carefully and repeat chest x-ray in a.m.  Leukocytosis -Patient's WBC had worsened but has now trended down and is now 7.6 but is back up again to 11.1 -Her WBC peaked at 12.6 -This is likely in the setting of her steroid demargination from the dexamethasone from above -Continue to monitor and trend repeat CBC in a.m.  Anemia of Dhronic Disease/Normocyctic Anemia -likely from underlying  malignancy -Hemoglobin/hematocrit went from 11.3/34.4 on admission is now 10.6/32.7 -Check anemia panel and showed an iron level of 67, U IBC of 230, TIBC of 297, saturation ratios of 23, ferritin level 516, folate level 17.0, vitamin B12 level 459 -Continue to monitor for signs and symptoms of bleeding; currently no overt bleeding noted -Repeat CBC in a.m if not going to Hospice care  Thrombocytosis -Likely reactive and worsened in the setting of Steroids and her illness -Platelet Count went from 526 -> 463 -> 556 -> 546 -> 649 ->  641 -> 577 -> 521 -Continue to Monitor and Trend -Repeat CBC in AM if not going to [place], improved -Patients Sodium is now 135 and Chloride Level is 95 -Initially felt that it dropped in the setting of D5-1/2 NS at 75 mL/hr which is being continued as she is not eating much and further declining  -Continue to Monitor and Trend and repeat in the AM as she is not going to [place] just yet   Hyperglycemia in the setting of prediabetes -Elevated on Admission with a Blood Sugar of 151 -Now Daily Blood Sugars ranging from 113-407 on BMP/CMP; CBGs ranging from 128-144 -Check HbA1c and was 6.3.  Last A1c was 5.4 -Likely to worsen in the setting of Steroid Demargination -If necessary will place on Sensitive Novolog SSI AC  Metastatic Breast Cancer -Patient on chemotherapy per oncology; currently on Pembrolizumab 200 mg however oncology evaluated and feels that she would not benefit from any additional systemic therapy -Continue with pain control with oxycodone-acetaminophen as well as oxycodone IR every 8 hours as needed for severe pain and continue tramadol 50 mg p.o. every 6 as needed for moderate pain; palliative care has added Dilaudid -Appreciate further care per Oncology and they are now recommending hospice care; Radiation Oncology still following and currently getting treatments and Dr. Lisbeth Renshaw wants to evaluate  the patient throughout the weekend to see if she improves any but if she does not then the plan will be to get her to residential hospice early next week but she did improve from yesterday to today so we will continue the current course and she will be getting brain radiation again today. -Have also consulted Palliative Care Medicine for further GOC and appreciate Dr. Kirstie Mirza evaluation of the patient.  Appreciate Dr. Kirstie Mirza help and treatment of her nausea as well as her pain and since she has shown improvement from yesterday today and will  continue to monitor her nutrition, cognition and functional status as indicators for how she is doing overall  Somnolence and Worsening confusion, improved significantly from yesterday -I she is much more awake and alert today -In the setting of Metastatic Disease to the brain -Continue to Monitor Carefully  -C/w Supportive Care  Elevated AST -Patient's AST is 43 and now improved to 33 -In the setting of Steroids -Continue to Monitor and Trend if she does not go to Hospice Care  DVT prophylaxis: SCDs Code Status: DO NOT RESUSCITATE now Family Communication: No family present at bedside Disposition Plan: Pending improvement in Dizziness and Nausea and continued treatment  Status is: Inpatient  Remains inpatient appropriate because:Continued Dizziness and now Somnolence   Dispo: The patient is from: Home              Anticipated d/c is likley to: TBD              Anticipated d/c date is: 2 days              Patient currently is not medically stable to d/c.  Consultants:   Medical Oncology  Radiation Oncology  Interventional Radiology  Palliative Care Medicine    Procedures:  U/S guided Diagnostic and Therapeutic R Thoracentesis yielding 700 mL of Yellow Fluid    Antimicrobials:  Anti-infectives (From admission, onward)   None     Subjective: Seen and examined at bedside and is much more awake and alert and still complaining of some nausea and some left-sided abdominal pain but states it is any better.  She wants to try to eat something and she has had a drastic change from this weekend.  Feels little bit better.  No dizziness currently but states she gets dizzy when she tries to move.  No other concerns or complaints at this time.  Objective: Vitals:   09/05/19 0547 09/05/19 1356 09/05/19 2023 09/06/19 0553  BP: (!) 131/92 116/72 99/67 124/80  Pulse: 98 82 86 98  Resp: 15  17 18   Temp: (!) 97.5 F (36.4 C) 97.9 F (36.6 C) (!) 97.5 F (36.4 C) (!) 97.3 F  (36.3 C)  TempSrc: Oral Oral Oral Oral  SpO2: 99% 97% 94% 98%  Weight:      Height:        Intake/Output Summary (Last 24 hours) at 09/06/2019 1825 Last data filed at 09/06/2019 1100 Gross per 24 hour  Intake --  Output 1000 ml  Net -1000 ml   Filed Weights   08/29/19 1459  Weight: 43.5 kg   Examination: Physical Exam:  Constitutional: The patient is a thin cachectic chronically ill-appearing Caucasian female who is a lot more awake today than she was this whole weekend.  She continues to complain of some nausea but is now willing to try something to eat.  Continues to have some left-sided pain but is not as bad., NAD and appears calm and  comfortable Eyes: Lids and conjunctivae normal, sclerae anicteric  ENMT: External Ears, Nose appear normal. Grossly normal hearing. .  Neck: Appears normal, supple, no cervical masses, normal ROM, no appreciable thyromegaly; no JVD Respiratory: Diminished to auscultation bilaterally, no wheezing, rales, rhonchi or crackles. Normal respiratory effort and patient is not tachypenic. No accessory muscle use.  Unlabored breathing Cardiovascular: RRR, no murmurs / rubs / gallops. S1 and S2 auscultated.  Abdomen: Soft, non-tender, non-distended. Bowel sounds positive.  GU: Deferred. Musculoskeletal: No clubbing / cyanosis of digits/nails. No joint deformity upper and lower extremities.  Skin: No rashes, lesions, ulcers on limited skin evaluation. No induration; Warm and dry.  Neurologic: CN 2-12 grossly intact with no focal deficits. Romberg sign and cerebellar reflexes not assessed.  Psychiatric: Normal judgment and insight.  Much more awake and is alert and oriented x 3.  Slightly depressed appearing mood.  Data Reviewed: I have personally reviewed following labs and imaging studies  CBC: Recent Labs  Lab 09/02/19 0331 09/03/19 0505 09/04/19 0500 09/05/19 0500 09/06/19 0405  WBC 10.3 12.6* 10.2 7.6 11.1*  NEUTROABS 8.8* 11.1* 9.0* 6.7 9.9*   HGB 10.2* 12.6 12.4 11.2* 10.6*  HCT 32.0* 38.7 37.3 34.6* 32.7*  MCV 99.1 95.6 94.0 96.9 99.1  PLT 546* 649* 641* 577* [id number]*   Basic Metabolic Panel: Recent Labs  Lab 09/02/19 0331 09/03/19 0505 09/04/19 0500 09/05/19 0500 09/06/19 0405  NA 137 130* 131* 135 135  K 4.3 3.8 3.6 3.5 4.0  CL 100 94* 95* 99 102  CO2 29 28 26 27 27   GLUCOSE 120* 164* 407* 143* 149*  BUN 21 17 15 16 14   CREATININE 0.71 0.58 0.62 0.60 0.66  CALCIUM 8.3* 8.4* 7.7* 8.0* 8.0*  MG 2.3 2.2 2.0 2.2 2.2  PHOS 4.2 3.2 3.0 2.9 2.6   GFR: Estimated Creatinine Clearance: 48.1 mL/min (by C-G formula based on SCr of 0.66 mg/dL). Liver Function Tests: Recent Labs  Lab 09/02/19 0331 09/03/19 0505 09/04/19 0500 09/05/19 0500 09/06/19 0405  AST 39 43* 34 35 33  ALT 27 30 25  33 29  ALKPHOS 82 92 72 74 75  BILITOT 0.4 0.7 0.8 0.5 0.2*  PROT 5.6* 6.3* 5.0* 5.2* 5.2*  ALBUMIN 2.9* 3.4* 2.8* 2.7* 2.8*   No results for input(s): LIPASE, AMYLASE in the last 168 hours. No results for input(s): AMMONIA in the last 168 hours. Coagulation Profile: No results for input(s): INR, PROTIME in the last 168 hours. Cardiac Enzymes: No results for input(s): CKTOTAL, CKMB, CKMBINDEX, TROPONINI in the last 168 hours. BNP (last 3 results) No results for input(s): PROBNP in the last 8760 hours. HbA1C: No results for input(s): HGBA1C in the last 72 hours. CBG: Recent Labs  Lab 09/03/19 1951 09/04/19 1140  GLUCAP 128* 144*   Lipid Profile: No results for input(s): CHOL, HDL, LDLCALC, TRIG, CHOLHDL, LDLDIRECT in the last 72 hours. Thyroid Function Tests: No results for input(s): TSH, T4TOTAL, FREET4, T3FREE, THYROIDAB in the last 72 hours. Anemia Panel: No results for input(s): VITAMINB12, FOLATE, FERRITIN, TIBC, IRON, RETICCTPCT in the last 72 hours. Sepsis Labs: No results for input(s): PROCALCITON, LATICACIDVEN in the last 168 hours.  Recent Results (from the past 240 hour(s))  SARS Coronavirus 2 by RT PCR  (hospital order, performed in [facility] hospital lab) Nasopharyngeal Nasopharyngeal Swab     Status: None   Collection Time: 08/29/19  8:34 PM   Specimen: Nasopharyngeal Swab  Result Value Ref Range Status   SARS Coronavirus 2 NEGATIVE  NEGATIVE Final    Comment: (NOTE) SARS-CoV-2 target nucleic acids are NOT DETECTED. The SARS-CoV-2 RNA is generally detectable in upper and lower respiratory specimens during the acute phase of infection. The lowest concentration of SARS-CoV-2 viral copies this assay can detect is 250 copies / mL. A negative result does not preclude SARS-CoV-2 infection and should not be used as the sole basis for treatment or other patient management decisions.  A negative result may occur with improper specimen collection / handling, submission of specimen other than nasopharyngeal swab, presence of viral mutation(s) within the areas targeted by this assay, and inadequate number of viral copies (<250 copies / mL). A negative result must be combined with clinical observations, patient history, and epidemiological information. Fact Sheet for Patients:   StrictlyIdeas.no Fact Sheet for Healthcare Providers: BankingDealers.co.za This test is not yet approved or cleared  by the [place] FDA and has been authorized for detection and/or diagnosis of SARS-CoV-2 by FDA under an Emergency Use Authorization (EUA).  This EUA will remain in effect (meaning this test can be used) for the duration of the COVID-19 declaration under Section 564(b)(1) of the Act, 21 U.S.C. section 360bbb-3(b)(1), unless the authorization is terminated or revoked sooner. Performed at [facility], [place] [address], [place],  [address]     RN Pressure Injury Documentation:     Estimated body mass index is 17.01 kg/m as calculated from the following:   Height as of this encounter: 5\' 3"  (1.6 m).   Weight as of this  encounter: 43.5 kg.  Malnutrition Type:      Malnutrition Characteristics:      Nutrition Interventions:    Radiology Studies: DG CHEST PORT 1 VIEW  Result Date: 09/05/2019 CLINICAL DATA:  Shortness of breath EXAM: PORTABLE CHEST 1 VIEW COMPARISON:  Three days ago FINDINGS: Port with tip at the upper right atrium. No suspected cardiomegaly with left heart partially obscured. Hazy bilateral chest from pleural fluid. There is also volume loss and pulmonary opacity on the left more than right. There has been left-sided lung surgery. No visible pneumothorax. IMPRESSION: Post treatment chest with stable pleural effusions that obscure the left more than right lung. Electronically Signed   By: Monte Fantasia M.D.   On: 09/05/2019 07:34   Scheduled Meds: . Chlorhexidine Gluconate Cloth  6 each Topical Daily  . dexamethasone  4 mg Oral Q8H  . gabapentin  300 mg Oral BID  . gabapentin  300 mg Oral Once  . levothyroxine  25 mcg Oral QAC breakfast  . metoCLOPramide (REGLAN) injection  5 mg Intravenous Q6H  . prednisoLONE acetate  1 drop Both Eyes Daily   Continuous Infusions: . dextrose 5 % and 0.45% NaCl 75 mL/hr at 09/06/19 0302    LOS: 8 days   Kerney Elbe, DO [facility] PAGER is on AMION  If 7PM-7AM, please contact night-coverage [email or web address]

## 2019-09-06 NOTE — Telephone Encounter (Signed)
I called and spoke with the patient's husband and he indicated that he and his family have noticed improvement over the weekend with Elizabeth [name]'s progress. We will plan on radiation today but if something changes rely on decision making with palliative care.

## 2019-09-06 NOTE — Progress Notes (Signed)
Physical Therapy Treatment Patient Details Name: Elizabeth [name] MRN: [id number] DOB: July 31, 1955 Today's Date: 09/06/2019    History of Present Illness 64 year old thin chronically ill-appearing Caucasian female with a past medical history significant for but not limited to metastatic left sided breast cancer, hypothyroidism, hypercholesterolemia, history of migraines, vertigo as well as other comorbidities who presented with a chief complaint of dizziness for the last 3 to 4 days with frequent falls.  She denied losing any consciousness however she presented to the ED for these chief complaint and further work-up was done and she had a head CT which showed metastatic lesions and an MRI of the brain which showed multiple metastatic lesions mostly in the cerebellum with mass-effect.  Patient was started on IV Decadron and medical oncology was consulted.  Subsequently radiation oncology was notified and she also went thoracentesis for a right pleural effusion.  Radiation oncology evaluated and felt that she was a candidate for brain radiotherapy was subsequently proceeding with systemic therapy under the care of Dr. Ladona Mow when she has completed radiotherapy.  Radiation oncology recommending a course of 10 fractions with the possibility of their time salvage radiosurgery if needed to the largest lesion in the cerebellum.    PT Comments    Patient making steady progress with mobility but remains limited by lethargy and requires increased time to follow simple commands. Min assist required today to complete bed mobility and Mod assist to perform transfers with RW. Pt continues to require repeated multimodal cues for safe sequencing with mobility. Patient HR elevated to 130 bpm as max and decreased to 97-103 at EOS while sitting in recliner. Patient will continue to benefit from skilled PT interventions to progress mobility as able. Continue to recommend SNF level therapy for follow up, Acute PT will progress  as able.   Follow Up Recommendations  SNF;Supervision/Assistance - 24 hour     Equipment Recommendations  Rolling walker with 5" wheels(wv wheelchair for safety)    Recommendations for Other Services       Precautions / Restrictions Precautions Precautions: Fall Restrictions Weight Bearing Restrictions: No    Mobility  Bed Mobility Overal bed mobility: Needs Assistance Bed Mobility: Supine to Sit     Supine to sit: Min assist;HOB elevated     General bed mobility comments: pt slow with processing and verbal/tactiel cues required to sequence bed mobility. Assist to reach for bed rail and bring LE's off EOB, assist to raise trunk. pt able to use bil UE to scoot to EOB.   Transfers Overall transfer level: Needs assistance Equipment used: Rolling walker (2 wheeled) Transfers: Sit to/from [facility] Sit to Stand: Mod assist;+2 safety/equipment Stand pivot transfers: Mod assist;+2 safety/equipment       General transfer comment: patient required verbal/tactile cues to maintain safe hand placement on RW with sit<>stand. Mod assist to initiate and steady with power up. Pt requried one step cues for step sequencing to move to recliner. Pt unsteady and mod assist required to maintain balance and manage RW.    Ambulation/Gait            Stairs          Wheelchair Mobility    Modified Rankin (Stroke Patients Only)       Balance Overall balance assessment: Needs assistance Sitting-balance support: Feet supported;Bilateral upper extremity supported Sitting balance-Leahy Scale: Poor Sitting balance - Comments: pt with slouched/slumped posture in sitting.    Standing balance support: Bilateral upper extremity supported Standing balance-Leahy Scale: Poor  Standing balance comment: postural sway observed in standing             Cognition Arousal/Alertness: Lethargic Behavior During Therapy: Flat affect Overall Cognitive Status: Difficult to  assess            General Comments: pt oriented to self, difficult to assess due to lethargy. pt responding "mmhm" to 90% of questions, able to answer with more detail when prompted      Exercises      General Comments General comments (skin integrity, edema, etc.): Pt HR resting in low 100's at start while pt in supine. elevated to 110's-120's with sitting and reached max of 130 bpm with standing and stepping to recliner. decreased to 97 bpm after sitting in recliner.       Pertinent Vitals/Pain Pain Assessment: Faces Faces Pain Scale: Hurts a little bit Pain Location: headache and abdomen Pain Descriptors / Indicators: Discomfort Pain Intervention(s): Limited activity within patient's tolerance;Monitored during session;Repositioned           PT Goals (current goals can now be found in the care plan section) Acute Rehab PT Goals PT Goal Formulation: Patient unable to participate in goal setting Time For Goal Achievement: 09/09/19 Potential to Achieve Goals: Fair Progress towards PT goals: Progressing toward goals    Frequency    Min 2X/week      PT Plan Current plan remains appropriate       AM-PAC PT "6 Clicks" Mobility   Outcome Measure  Help needed turning from your back to your side while in a flat bed without using bedrails?: A Little Help needed moving from lying on your back to sitting on the side of a flat bed without using bedrails?: A Little Help needed moving to and from a bed to a chair (including a wheelchair)?: A Lot Help needed standing up from a chair using your arms (e.g., wheelchair or bedside chair)?: A Lot Help needed to walk in hospital room?: A Lot Help needed climbing 3-5 steps with a railing? : Total 6 Click Score: 13    End of Session Equipment Utilized During Treatment: Gait belt Activity Tolerance: Patient tolerated treatment well;Patient limited by lethargy Patient left: in chair;with family/visitor present;with chair alarm  set Nurse Communication: Mobility status PT Visit Diagnosis: Other abnormalities of gait and mobility (R26.89);Unsteadiness on feet (R26.81)     Time: [id number] PT Time Calculation (min) (ACUTE ONLY): 32 min  Charges:  $Therapeutic Activity: 23-37 mins                     Verner Mould, DPT Physical Therapist with [facility] [phone]  09/06/2019 1:36 PM

## 2019-09-07 ENCOUNTER — Ambulatory Visit
Admit: 2019-09-07 | Discharge: 2019-09-07 | Disposition: A | Discharge status: Home / Self Care | Payer: Medicare Other | Attending: Radiation Oncology | Admitting: Radiation Oncology

## 2019-09-07 LAB — CBC WITH DIFFERENTIAL/PLATELET
Abs Immature Granulocytes: 0.17 10*3/uL — ABNORMAL HIGH (ref 0.00–0.07)
Basophils Absolute: 0 10*3/uL (ref 0.0–0.1)
Basophils Relative: 0 %
Eosinophils Absolute: 0 10*3/uL (ref 0.0–0.5)
Eosinophils Relative: 0 %
HCT: 30.8 % — ABNORMAL LOW (ref 36.0–46.0)
Hemoglobin: 10.1 g/dL — ABNORMAL LOW (ref 12.0–15.0)
Immature Granulocytes: 2 %
Lymphocytes Relative: 4 %
Lymphs Abs: 0.4 10*3/uL — ABNORMAL LOW (ref 0.7–4.0)
MCH: 32.1 pg (ref 26.0–34.0)
MCHC: 32.8 g/dL (ref 30.0–36.0)
MCV: 97.8 fL (ref 80.0–100.0)
Monocytes Absolute: 0.8 10*3/uL (ref 0.1–1.0)
Monocytes Relative: 8 %
Neutro Abs: 9.1 10*3/uL — ABNORMAL HIGH (ref 1.7–7.7)
Neutrophils Relative %: 86 %
Platelets: 503 10*3/uL — ABNORMAL HIGH (ref 150–400)
RBC: 3.15 MIL/uL — ABNORMAL LOW (ref 3.87–5.11)
RDW: 14.8 % (ref 11.5–15.5)
WBC: 10.5 10*3/uL (ref 4.0–10.5)
nRBC: 0 % (ref 0.0–0.2)

## 2019-09-07 LAB — COMPREHENSIVE METABOLIC PANEL
ALT: 23 U/L (ref 0–44)
AST: 31 U/L (ref 15–41)
Albumin: 2.8 g/dL — ABNORMAL LOW (ref 3.5–5.0)
Alkaline Phosphatase: 72 U/L (ref 38–126)
Anion gap: 7 (ref 5–15)
BUN: 13 mg/dL (ref 8–23)
CO2: 25 mmol/L (ref 22–32)
Calcium: 8.1 mg/dL — ABNORMAL LOW (ref 8.9–10.3)
Chloride: 102 mmol/L (ref 98–111)
Creatinine, Ser: 0.68 mg/dL (ref 0.44–1.00)
GFR calc Af Amer: >60 mL/min (ref >60)
GFR calc non Af Amer: >60 mL/min (ref >60)
Glucose, Bld: 187 mg/dL — ABNORMAL HIGH (ref 70–99)
Potassium: 3.7 mmol/L (ref 3.5–5.1)
Sodium: 134 mmol/L — ABNORMAL LOW (ref 135–145)
Total Bilirubin: 0.3 mg/dL (ref 0.3–1.2)
Total Protein: 5.3 g/dL — ABNORMAL LOW (ref 6.5–8.1)

## 2019-09-07 LAB — MAGNESIUM: Magnesium: 2.2 mg/dL (ref 1.7–2.4)

## 2019-09-07 LAB — PHOSPHORUS: Phosphorus: 2.5 mg/dL (ref 2.5–4.6)

## 2019-09-07 NOTE — Progress Notes (Signed)
Occupational Therapy Treatment Patient Details Name: Elizabeth [name] MRN: [id number] DOB: 1956-02-15 Today's Date: 09/07/2019    History of present illness 64 year old thin chronically ill-appearing Caucasian female with a past medical history significant for but not limited to metastatic left sided breast cancer, hypothyroidism, hypercholesterolemia, history of migraines, vertigo as well as other comorbidities who presented with a chief complaint of dizziness for the last 3 to 4 days with frequent falls.  She denied losing any consciousness however she presented to the ED for these chief complaint and further work-up was done and she had a head CT which showed metastatic lesions and an MRI of the brain which showed multiple metastatic lesions mostly in the cerebellum with mass-effect.  Patient was started on IV Decadron and medical oncology was consulted.  Subsequently radiation oncology was notified and she also went thoracentesis for a right pleural effusion.  Radiation oncology evaluated and felt that she was a candidate for brain radiotherapy was subsequently proceeding with systemic therapy under the care of Dr. Ladona Mow when she has completed radiotherapy.  Radiation oncology recommending a course of 10 fractions with the possibility of their time salvage radiosurgery if needed to the largest lesion in the cerebellum.   OT comments  Pt lethargic this day.  Educated pt and daughter on elevating LUE and ROM to A with edema  Follow Up Recommendations  Home health OT;Other (comment)    Equipment Recommendations  3 in 1 bedside commode    Recommendations for Other Services      Precautions / Restrictions Precautions Precautions: Fall Restrictions Weight Bearing Restrictions: No              ADL either performed or assessed with clinical judgement   ADL Overall ADL's : Needs assistance/impaired Eating/Feeding: Maximal assistance;Bed level   Grooming: Maximal assistance;Bed level                                 General ADL Comments: Pts LUE lymphedema sleeve snug. encouraraged daugther to elevate LUE and A pt with ROM as needed.               Cognition Arousal/Alertness: Lethargic Behavior During Therapy: Flat affect Overall Cognitive Status: Difficult to assess                                                     Pertinent Vitals/ Pain       Pain Assessment: No/denies pain      Progress Toward Goals  OT Goals(current goals can now be found in the care plan section)  Progress towards OT goals: OT to reassess next treatment     Plan Discharge plan remains appropriate       AM-PAC OT "6 Clicks" Daily Activity     Outcome Measure   Help from another person eating meals?: A Lot Help from another person taking care of personal grooming?: A Lot Help from another person toileting, which includes using toliet, bedpan, or urinal?: Total Help from another person bathing (including washing, rinsing, drying)?: Total Help from another person to put on and taking off regular upper body clothing?: A Lot Help from another person to put on and taking off regular lower body clothing?: Total 6 Click Score: 9    End of Session  OT Visit Diagnosis: Unsteadiness on feet (R26.81);Muscle weakness (generalized) (M62.81);Adult, failure to thrive (R62.7)   Activity Tolerance Patient limited by fatigue   Patient Left in bed;with call bell/phone within reach;with bed alarm set;with family/visitor present   Nurse Communication Other (comment)(elevation needed LUE)        [id number] OT Time Calculation (min): 21 min  Charges: OT General Charges $OT Visit: 1 Visit OT Treatments $Self Care/Home Management : 8-22 mins  Kari Baars, [place] [phone] Office- [phone]      Kaufman, Edwena Felty D 09/07/2019, 1:20 PM

## 2019-09-07 NOTE — Care Management Important Message (Signed)
Important Message  Patient Details IM Letter given to Roque Lias SW Case Manager to present to the Patient Name: Elizabeth [name] MRN: [id number] Date of Birth: [birth year]   Medicare Important Message Given:  Yes     Kerin Salen 09/07/2019, 10:33 AM

## 2019-09-07 NOTE — Progress Notes (Signed)
PROGRESS NOTE    Elizabeth [name]  [id number] DOB: 08/11/1955 DOA: 08/29/2019 PCP: [name], PA  Brief Narrative:  The patient is a 64 year old thin chronically ill-appearing Caucasian female with a past medical history significant for but not limited to metastatic left sided breast cancer, hypothyroidism, hypercholesterolemia, history of migraines, vertigo as well as other comorbidities who presented with a chief complaint of dizziness for the last 3 to 4 days with frequent falls.  She denied losing any consciousness however she presented to the ED for these chief complaint and further work-up was done and she had a head CT which showed metastatic lesions and an MRI of the brain which showed multiple metastatic lesions mostly in the cerebellum with mass-effect.  Patient was started on IV Decadron and medical oncology was consulted.  Subsequently radiation oncology was notified and she also went thoracentesis for a right pleural effusion.  Radiation oncology evaluated and felt that she was a candidate for brain radiotherapy was subsequently proceeding with systemic therapy under the care of Dr. Ladona Mow when she has completed radiotherapy but Medical Oncology has recommended hospice.   Radiation oncology recommending a course of 10 fractions with the possibility of their time salvage radiosurgery if needed to the largest lesion in the cerebellum. Underwent Radiation again today.   Medical oncology has evaluated the patient and now they are recommending hospice care as they believe that any additional systemic therapy would not be of any benefit. Dr. Lindi Adie is to speak with Dr. Lisbeth Renshaw and they will decide if further radiation is warranted. Palliative care has been consulted for goals of care discussion likely if they are agreeable will transition to residential hospice.  After discussion with the care team Dr. Lisbeth Renshaw wanted to see how the patient will do this week and if she had no significant  improvement then the plan will be for residential hospice. She is much improved compared to the weekend and she is actually hungry and asking for food.  Continued to be dizzy and nauseous but states that she is doing little bit better and not complaining of left-sided pain.   Assessment & Plan:   Principal Problem:   Brain metastasis ([place]) Active Problems:   Malignant neoplasm of lower-outer quadrant of left breast of female, estrogen receptor positive (HCC)   Pleural effusion   Vasogenic brain edema (HCC)  Dizziness 2/2 Metastatic brain lesions with at least 50 brain Metastasis, improving some  -Patient has vasogenic edema with mass-effect seen on the MRI brain.   -Started on IV Decadron 8 mg 3 times daily and dose reduced to 4 mg every 8 hours  and will continue for now; she remains persistently dizzy especially when she moves -Patient was seen by radiation oncology.  Plan for whole brain radiation again.  Patient will need at least 10 treatments and later radiosurgery for large lesion in cerebellum and systemic chemotherapy.  She underwent radiation therapy again today given her improvement  -C/w Supportive Care and Treatment -C/w antiemetics with prochlorperazine 10 mg every 6 hours as needed for nausea and vomiting as well as Zofran 4 mg p.o./IV every 6 hours as needed; continues to have some nausea but is better after Reglan scheduled has been added -Continue with pain control with Oxycodone IR and Percocet -Consulted Palliative for Further GOC discussion -Will have Medical Oncology weigh in and now they are recommending hospice care. -Patient had been steadily declining and she has not been improving and she continued to be somnolent and dizzy but  she is much more awake and alert today and daughter is at bedside and she is actually hungry still has a very high risk for decompensation given her poor prognosis.   -We will continue goals of care discussion and appreciate palliative care  involvement.  She did not make any significant improvement this weekend, however yesterday and today she is much more awake and alert and she feels that her nausea is improved with the scheduled Reglan.  She reports that her pain is little bit better with scheduled Dilaudid.  We will continue to monitor her status and continue current course. Palliative recommends having palliative care follow-up at her next venue if she does not go with hospice services -CODE STATUS is now been addressed and she is now DO NOT RESUSCITATE  Hypokalemia -Improved. K+ is now 4.0 -Continue to monitor and replete as necessary -Repeat CMP in a.m as she is not fully transition to comfort care or residential hospice just yet  Hypothyroidism -Check TSH and was 1.054 -Continue with levothyroxine 25 mcg p.o. daily before breakfast  Right Pleural Effusion -Underwent thoracentesis on 08/30/2019 -Repeat chest x-ray 09/05/19 showed "Post treatment chest with stable pleural effusions that obscure the left more than right lung." -Continue to Monitor Respiratory Status carefully and repeat chest x-ray in a.m.  Leukocytosis -Patient's WBC had worsened but has now trended down and is now 7.6 but is back up again to 10.5 -Her WBC peaked at 12.6 -This is likely in the setting of her steroid demargination from the dexamethasone from above -Continue to monitor and trend repeat CBC in a.m.  Anemia of Dhronic Disease/Normocyctic Anemia -likely from underlying  malignancy -Hemoglobin/hematocrit went from 11.3/34.4 on admission is now 10.1/30.8 -Check anemia panel and showed an iron level of 67, U IBC of 230, TIBC of 297, saturation ratios of 23, ferritin level 516, folate level 17.0, vitamin B12 level 459 -Continue to monitor for signs and symptoms of bleeding; currently no overt bleeding noted -Repeat CBC in a.m   Thrombocytosis -Likely reactive and worsened in the setting of Steroids and her illness -Platelet Count went from  526 -> 463 -> 556 -> 546 -> 649 -> 641 -> 577 -> 521 -> 503 -Continue to Monitor and Trend -Repeat CBC in AM  Hyponatremia/Hypochloremia mild -Patients Sodium is now 134 and Chloride Level is 102 -Initially felt that it dropped in the setting of D5-1/2 NS at 75 mL/hr -Continue to Monitor and Trend and repeat in the AM   Hyperglycemia in the setting of prediabetes -Elevated on Admission with a Blood Sugar of 151 -Now Daily Blood Sugars ranging from 113-407 on BMP/CMP; CBGs ranging from 128-187 -Check HbA1c and was 6.3.  Last A1c was 5.4 -Likely to worsen in the setting of Steroid Demargination and also because she is on D5 1/2 NS -If necessary will place on Sensitive Novolog SSI AC  Metastatic Breast Cancer -Patient on chemotherapy per oncology; currently on Pembrolizumab 200 mg however oncology evaluated and feels that she would not benefit from any additional systemic therapy -Continue with pain control with oxycodone-acetaminophen as well as oxycodone IR every 8 hours as needed for severe pain and continue tramadol 50 mg p.o. every 6 as needed for moderate pain; palliative care has added Dilaudid -Appreciate further care per Oncology and they are now recommending hospice care; Radiation Oncology still following and currently getting treatments and Dr. Lisbeth Renshaw wants to evaluate the patient throughout the weekend to see if she improves any but if she does not  then the plan will be to get her to residential hospice early next week but she did improve from yesterday to today so we will continue the current course and she will be getting brain radiation again today. -Have also consulted Palliative Care Medicine for further [place] and appreciate evaluation of the patient.  Palliative helped and treatment of her nausea as well as her pain and since she has shown improvement from yesterday today and will continue to monitor her nutrition, cognition and functional status as indicators for how she is doing  overall and will continue the course as she gets daily radiation   Somnolence and Worsening confusion, improved significantly  -She is much more awake and alert today and more so today than yesterday  -In the setting of Metastatic Disease to the brain -Continue to Monitor Carefully  -C/w Supportive Care  Elevated AST -Patient's AST is 43 and now improved to 31 -In the setting of Steroids -Continue to Monitor and Trend  DVT prophylaxis: SCDs Code Status: DO NOT RESUSCITATE now Family Communication: Daughter at bedside Disposition Plan: Pending improvement in Dizziness and Nausea and continued treatment  Status is: Inpatient  Remains inpatient appropriate because:Continued Dizziness and now Somnolence   Dispo: The patient is from: Home              Anticipated d/c is likley to: TBD              Anticipated d/c date is: 2 days              Patient currently is not medically stable to d/c.  Consultants:   Medical Oncology  Radiation Oncology  Interventional Radiology  Palliative Care Medicine    Procedures:  U/S guided Diagnostic and Therapeutic R Thoracentesis yielding 700 mL of Yellow Fluid    Antimicrobials:  Anti-infectives (From admission, onward)   None     Subjective: Seen and examined at bedside and is much more awake and not complaining of nausea and states her abdominal pain left-sided pain is improved.  Asking for food now and wanting some ginger ale and some applesauce.  No lightheadedness or dizziness.  Continue radiation efforts.  Daughter at bedside  Objective: Vitals:   09/06/19 1500 09/06/19 1956 09/07/19 0417 09/07/19 1607  BP: 110/76 (!) 142/90 133/77 128/87  Pulse: 92 97 97 97  Resp: 16 17 16 16   Temp: (!) 97.2 F (36.2 C) 97.9 F (36.6 C) 97.9 F (36.6 C) (!) 97.5 F (36.4 C)  TempSrc: Oral Oral Oral Oral  SpO2: 97% 100% 98% 100%  Weight:      Height:        Intake/Output Summary (Last 24 hours) at 09/07/2019 1709 Last data filed at  09/07/2019 0421 Gross per 24 hour  Intake 210 ml  Output --  Net 210 ml   Filed Weights   08/29/19 1459  Weight: 43.5 kg   Examination: Physical Exam:  Constitutional: Patient is a thin cachectic chronically ill-appearing Caucasian female who is much more awake today at with her daughter at bedside.  She is not complaining as much nausea and denies any pain.  Appears calm today. Eyes: Lids and conjunctivae normal, sclerae anicteric  ENMT: External Ears, Nose appear normal. Grossly normal hearing.  Neck: Appears normal, supple, no cervical masses, normal ROM, no appreciable thyromegaly; no JVD Respiratory: Diminished to auscultation bilaterally, no wheezing, rales, rhonchi or crackles. Normal respiratory effort and patient is not tachypenic. No accessory muscle use.  Unlabored breathing Cardiovascular: Mildly  tachycardic, no murmurs / rubs / gallops. S1 and S2 auscultated.  Abdomen: Soft, non-tender, non-distended. Bowel sounds positive.  GU: Deferred. Musculoskeletal: No clubbing / cyanosis of digits/nails. No joint deformity upper and lower extremities.  Skin: No rashes, lesions, ulcers on limited skin evaluation. No induration; Warm and dry.  Neurologic: CN 2-12 grossly intact with no focal deficits. Romberg sign and cerebellar reflexes not assessed.  Psychiatric: Normal judgment and insight. Alert and oriented x 3.  Appears slightly depressed.  Data Reviewed: I have personally reviewed following labs and imaging studies  CBC: Recent Labs  Lab 09/03/19 0505 09/04/19 0500 09/05/19 0500 09/06/19 0405 09/07/19 0500  WBC 12.6* 10.2 7.6 11.1* 10.5  NEUTROABS 11.1* 9.0* 6.7 9.9* 9.1*  HGB 12.6 12.4 11.2* 10.6* 10.1*  HCT 38.7 37.3 34.6* 32.7* 30.8*  MCV 95.6 94.0 96.9 99.1 97.8  PLT 649* 641* 577* 521* [id number]*   Basic Metabolic Panel: Recent Labs  Lab 09/03/19 0505 09/04/19 0500 09/05/19 0500 09/06/19 0405 09/07/19 0500  NA 130* 131* 135 135 134*  K 3.8 3.6 3.5 4.0 3.7   CL 94* 95* 99 102 102  CO2 28 26 27 27 25   GLUCOSE 164* 407* 143* 149* 187*  BUN 17 15 16 14 13   CREATININE 0.58 0.62 0.60 0.66 0.68  CALCIUM 8.4* 7.7* 8.0* 8.0* 8.1*  MG 2.2 2.0 2.2 2.2 2.2  PHOS 3.2 3.0 2.9 2.6 2.5   GFR: Estimated Creatinine Clearance: 48.1 mL/min (by C-G formula based on SCr of 0.68 mg/dL). Liver Function Tests: Recent Labs  Lab 09/03/19 0505 09/04/19 0500 09/05/19 0500 09/06/19 0405 09/07/19 0500  AST 43* 34 35 33 31  ALT 30 25 33 29 23  ALKPHOS 92 72 74 75 72  BILITOT 0.7 0.8 0.5 0.2* 0.3  PROT 6.3* 5.0* 5.2* 5.2* 5.3*  ALBUMIN 3.4* 2.8* 2.7* 2.8* 2.8*   No results for input(s): LIPASE, AMYLASE in the last 168 hours. No results for input(s): AMMONIA in the last 168 hours. Coagulation Profile: No results for input(s): INR, PROTIME in the last 168 hours. Cardiac Enzymes: No results for input(s): CKTOTAL, CKMB, CKMBINDEX, TROPONINI in the last 168 hours. BNP (last 3 results) No results for input(s): PROBNP in the last 8760 hours. HbA1C: No results for input(s): HGBA1C in the last 72 hours. CBG: Recent Labs  Lab 09/03/19 1951 09/04/19 1140  GLUCAP 128* 144*   Lipid Profile: No results for input(s): CHOL, HDL, LDLCALC, TRIG, CHOLHDL, LDLDIRECT in the last 72 hours. Thyroid Function Tests: No results for input(s): TSH, T4TOTAL, FREET4, T3FREE, THYROIDAB in the last 72 hours. Anemia Panel: No results for input(s): VITAMINB12, FOLATE, FERRITIN, TIBC, IRON, RETICCTPCT in the last 72 hours. Sepsis Labs: No results for input(s): PROCALCITON, LATICACIDVEN in the last 168 hours.  Recent Results (from the past 240 hour(s))  SARS Coronavirus 2 by RT PCR (hospital order, performed in [facility] hospital lab) Nasopharyngeal Nasopharyngeal Swab     Status: None   Collection Time: 08/29/19  8:34 PM   Specimen: Nasopharyngeal Swab  Result Value Ref Range Status   SARS Coronavirus 2 NEGATIVE NEGATIVE Final    Comment: (NOTE) SARS-CoV-2 target nucleic  acids are NOT DETECTED. The SARS-CoV-2 RNA is generally detectable in upper and lower respiratory specimens during the acute phase of infection. The lowest concentration of SARS-CoV-2 viral copies this assay can detect is 250 copies / mL. A negative result does not preclude SARS-CoV-2 infection and should not be used as the sole basis for treatment or  other patient management decisions.  A negative result may occur with improper specimen collection / handling, submission of specimen other than nasopharyngeal swab, presence of viral mutation(s) within the areas targeted by this assay, and inadequate number of viral copies (<250 copies / mL). A negative result must be combined with clinical observations, patient history, and epidemiological information. Fact Sheet for Patients:   StrictlyIdeas.no Fact Sheet for Healthcare Providers: BankingDealers.co.za This test is not yet approved or cleared  by the [place] FDA and has been authorized for detection and/or diagnosis of SARS-CoV-2 by FDA under an Emergency Use Authorization (EUA).  This EUA will remain in effect (meaning this test can be used) for the duration of the COVID-19 declaration under Section 564(b)(1) of the Act, 21 U.S.C. section 360bbb-3(b)(1), unless the authorization is terminated or revoked sooner. Performed at [facility], [place] [address], [place],  [address]     RN Pressure Injury Documentation:     Estimated body mass index is 17.01 kg/m as calculated from the following:   Height as of this encounter: 5\' 3"  (1.6 m).   Weight as of this encounter: 43.5 kg.  Malnutrition Type:      Malnutrition Characteristics:      Nutrition Interventions:    Radiology Studies: No results found. Scheduled Meds: . Chlorhexidine Gluconate Cloth  6 each Topical Daily  . dexamethasone  4 mg Oral Q8H  . gabapentin  300 mg Oral BID  .  gabapentin  300 mg Oral Once  . levothyroxine  25 mcg Oral QAC breakfast  . metoCLOPramide (REGLAN) injection  5 mg Intravenous Q6H  . prednisoLONE acetate  1 drop Both Eyes Daily   Continuous Infusions: . dextrose 5 % and 0.45% NaCl 75 mL/hr at 09/07/19 [id number]    LOS: 9 days   Kerney Elbe, DO [facility] PAGER is on AMION  If 7PM-7AM, please contact night-coverage [email or web address]

## 2019-09-08 ENCOUNTER — Ambulatory Visit
Admit: 2019-09-08 | Discharge: 2019-09-08 | Disposition: A | Discharge status: Home / Self Care | Payer: Medicare Other | Attending: Radiation Oncology | Admitting: Radiation Oncology

## 2019-09-08 DIAGNOSIS — R531 Weakness: Secondary | ICD-10-CM

## 2019-09-08 LAB — CBC WITH DIFFERENTIAL/PLATELET
Abs Immature Granulocytes: 0.13 10*3/uL — ABNORMAL HIGH (ref 0.00–0.07)
Basophils Absolute: 0 10*3/uL (ref 0.0–0.1)
Basophils Relative: 0 %
Eosinophils Absolute: 0 10*3/uL (ref 0.0–0.5)
Eosinophils Relative: 0 %
HCT: 32 % — ABNORMAL LOW (ref 36.0–46.0)
Hemoglobin: 10.4 g/dL — ABNORMAL LOW (ref 12.0–15.0)
Immature Granulocytes: 1 %
Lymphocytes Relative: 3 %
Lymphs Abs: 0.3 10*3/uL — ABNORMAL LOW (ref 0.7–4.0)
MCH: 31.9 pg (ref 26.0–34.0)
MCHC: 32.5 g/dL (ref 30.0–36.0)
MCV: 98.2 fL (ref 80.0–100.0)
Monocytes Absolute: 0.7 10*3/uL (ref 0.1–1.0)
Monocytes Relative: 6 %
Neutro Abs: 9.8 10*3/uL — ABNORMAL HIGH (ref 1.7–7.7)
Neutrophils Relative %: 90 %
Platelets: 508 10*3/uL — ABNORMAL HIGH (ref 150–400)
RBC: 3.26 MIL/uL — ABNORMAL LOW (ref 3.87–5.11)
RDW: 14.8 % (ref 11.5–15.5)
WBC: 10.9 10*3/uL — ABNORMAL HIGH (ref 4.0–10.5)
nRBC: 0 % (ref 0.0–0.2)

## 2019-09-08 LAB — COMPREHENSIVE METABOLIC PANEL
ALT: 22 U/L (ref 0–44)
AST: 33 U/L (ref 15–41)
Albumin: 2.9 g/dL — ABNORMAL LOW (ref 3.5–5.0)
Alkaline Phosphatase: 76 U/L (ref 38–126)
Anion gap: 7 (ref 5–15)
BUN: 12 mg/dL (ref 8–23)
CO2: 26 mmol/L (ref 22–32)
Calcium: 8 mg/dL — ABNORMAL LOW (ref 8.9–10.3)
Chloride: 102 mmol/L (ref 98–111)
Creatinine, Ser: 0.59 mg/dL (ref 0.44–1.00)
GFR calc Af Amer: >60 mL/min (ref >60)
GFR calc non Af Amer: >60 mL/min (ref >60)
Glucose, Bld: 128 mg/dL — ABNORMAL HIGH (ref 70–99)
Potassium: 3.5 mmol/L (ref 3.5–5.1)
Sodium: 135 mmol/L (ref 135–145)
Total Bilirubin: 0.5 mg/dL (ref 0.3–1.2)
Total Protein: 5.4 g/dL — ABNORMAL LOW (ref 6.5–8.1)

## 2019-09-08 LAB — URINALYSIS, ROUTINE W REFLEX MICROSCOPIC
Bilirubin Urine: NEGATIVE
Glucose, UA: NEGATIVE mg/dL
Hgb urine dipstick: NEGATIVE
Ketones, ur: NEGATIVE mg/dL
Leukocytes,Ua: NEGATIVE
Nitrite: NEGATIVE
Protein, ur: NEGATIVE mg/dL
Specific Gravity, Urine: 1.005 (ref 1.005–1.030)
pH: 5 (ref 5.0–8.0)

## 2019-09-08 LAB — MAGNESIUM: Magnesium: 2.2 mg/dL (ref 1.7–2.4)

## 2019-09-08 LAB — PHOSPHORUS: Phosphorus: 2.5 mg/dL (ref 2.5–4.6)

## 2019-09-08 MED ORDER — LORAZEPAM 0.5 MG PO TABS: 0.5000 mg | ORAL_TABLET | Freq: Every day | ORAL | Status: DC | PRN

## 2019-09-08 MED ORDER — BISACODYL 10 MG RE SUPP: 10.0000 mg | Freq: Every day | RECTAL | Status: DC | PRN

## 2019-09-08 NOTE — Progress Notes (Signed)
PROGRESS NOTE    Elizabeth [name]  [id number] DOB: October 22, 1955 DOA: 08/29/2019 PCP: [name], PA   Brief Narrative: Elizabeth [name] is a 64 y.o. female with a past medical history significant for but not limited to metastatic left sided breast cancer, hypothyroidism, hypercholesterolemia, history of migraines, vertigo. Patient presented secondary to dizziness and frequent falls found to have evidence of metastatic brain lesions with associated vasogenic edema and mass effect. Patient started on IV steroids and radiation therapy with some relative improvement in symptoms.   Assessment & Plan:   Principal Problem:   Brain metastasis ([place]) Active Problems:   Malignant neoplasm of lower-outer quadrant of left breast of female, estrogen receptor positive (HCC)   Pleural effusion   Vasogenic brain edema (HCC)   Metastatic brain lesions Dizziness Vasogenic edema with associated mass effect seen on MRI. Patient started on Decadron IV and radiation therapy.  -Radiation therapy per radiation oncology; plan for 10 treatments  Hypokalemia Resolved with repletion.  Hypothyroidism TSH of 1.054 -Continue synthroid  Right pleural effusion Patient underwent thoracentesis on 5/17 yielding 700 mL of yellow fluid which was only sent for pathology; pathology significant for inflammation with no mention of malignant cells. Currently stable on oxygen. No respiratory distress.  Leukocytosis No obvious evidence of infection however, patient does voice symptoms of dysuria. Complicated by steroid use. Unlikely leukocytosis is secondary to possible UTI.  Dysuria Per patient. Some hesitancy noted by nursing. -Urinalysis, urine culture -Bladder scan to ensure no urinary retention  Anemia of chronic disease Stable  Thrombocytosis In setting of cancer/inflammation. Stable.  Hyponatremia Mild. Possibly secondary to hypotonic IV fluids. Resolved.  Metastatic breast cancer Patient with  metastasis as mentioned above. Previously on chemotherapy, however, oncology now recommending hospice.  Pre-diabetes Hemoglobin A1C of 6.3%. Recommend diet control.  Somnolence Possibly secondary to medication but concern is this could be related to metastatic brain lesions. Earlier in course, this may have been the case. Appears to have improved with radiation therapy but is somnolent in setting of Ativan.   DVT prophylaxis: SCDs Code Status:   Code Status: DNR Family Communication: Husband and daughter at bedside Disposition Plan: Discharge home pending completion of radiation therapy as patient is currently unlikely to be able to transport easily from  Home to radiation as an outpatient. Patient is also not mentally at baseline.   Consultants:   Radiation oncology  Medical oncology  Palliative care  Interventional radiology  Procedures:   Radiation therapy  Antimicrobials:  Ceftriaxone    Subjective: Sleepy.  Objective: Vitals:   09/07/19 1952 09/07/19 2000 09/08/19 0530 09/08/19 1500  BP: 127/90 127/90 (!) 148/96 135/89  Pulse: 97 97 (!) 109 98  Resp:  17 17 18   Temp:  97.8 F (36.6 C) 98.3 F (36.8 C) 98.1 F (36.7 C)  TempSrc:  Oral Oral Oral  SpO2: 96% 96% 96% 100%  Weight:      Height:        Intake/Output Summary (Last 24 hours) at 09/08/2019 2006 Last data filed at 09/08/2019 1800 Gross per 24 hour  Intake 1638.19 ml  Output --  Net 1638.19 ml   Filed Weights   08/29/19 1459  Weight: 43.5 kg    Examination:  General exam: Appears calm and comfortable Respiratory system: Anterior auscultation. Left side is diminished and right side is clear. Respiratory effort normal. Cardiovascular system: S1 & S2 heard, RRR. No murmurs, rubs, gallops or clicks. Gastrointestinal system: Abdomen is nondistended, soft and nontender. No organomegaly  or masses felt. Normal bowel sounds heard. Central nervous system: Somnolent but arouses easily and is  oriented. Answers questions appropriately and follows commands. Musculoskeletal: No edema. No calf tenderness Skin: No cyanosis. No rashes Psychiatry: Judgement and insight appear normal. Mood & affect appropriate.     Data Reviewed: I have personally reviewed following labs and imaging studies  Last CBC Lab Results  Component Value Date   WBC 10.9 (H) 09/08/2019   RBC 3.26 (L) 09/08/2019   HGB 10.4 (L) 09/08/2019   HCT 32.0 (L) 09/08/2019   MCV 98.2 09/08/2019   MCH 31.9 09/08/2019   PLT 508 (H) 09/08/2019   MCHC 32.5 09/08/2019   RDW 14.8 09/08/2019   LYMPHSABS 0.3 (L) 09/08/2019   MONOABS 0.7 09/08/2019   EOSABS 0.0 09/08/2019   BASOSABS 0.0 99991111     Last metabolic panel Lab Results  Component Value Date   NA 135 09/08/2019   K 3.5 09/08/2019   CL 102 09/08/2019   CO2 26 09/08/2019   BUN 12 09/08/2019   CREATININE 0.59 09/08/2019   GLUCOSE 128 (H) 09/08/2019   GFRNONAA >60 09/08/2019   GFRAA >60 09/08/2019   CALCIUM 8.0 (L) 09/08/2019   PHOS 2.5 09/08/2019   PROT 5.4 (L) 09/08/2019   ALBUMIN 2.9 (L) 09/08/2019   BILITOT 0.5 09/08/2019   ALKPHOS 76 09/08/2019   AST 33 09/08/2019   ALT 22 09/08/2019   ANIONGAP 7 09/08/2019    CBG (last 3)  No results for input(s): GLUCAP in the last 72 hours.   GFR: Estimated Creatinine Clearance: 48.1 mL/min (by C-G formula based on SCr of 0.59 mg/dL).  Coagulation Profile: No results for input(s): INR, PROTIME in the last 168 hours.  Recent Results (from the past 240 hour(s))  SARS Coronavirus 2 by RT PCR (hospital order, performed in [facility] hospital lab) Nasopharyngeal Nasopharyngeal Swab     Status: None   Collection Time: 08/29/19  8:34 PM   Specimen: Nasopharyngeal Swab  Result Value Ref Range Status   SARS Coronavirus 2 NEGATIVE NEGATIVE Final    Comment: (NOTE) SARS-CoV-2 target nucleic acids are NOT DETECTED. The SARS-CoV-2 RNA is generally detectable in upper and lower respiratory specimens  during the acute phase of infection. The lowest concentration of SARS-CoV-2 viral copies this assay can detect is 250 copies / mL. A negative result does not preclude SARS-CoV-2 infection and should not be used as the sole basis for treatment or other patient management decisions.  A negative result may occur with improper specimen collection / handling, submission of specimen other than nasopharyngeal swab, presence of viral mutation(s) within the areas targeted by this assay, and inadequate number of viral copies (<250 copies / mL). A negative result must be combined with clinical observations, patient history, and epidemiological information. Fact Sheet for Patients:   StrictlyIdeas.no Fact Sheet for Healthcare Providers: BankingDealers.co.za This test is not yet approved or cleared  by the [place] FDA and has been authorized for detection and/or diagnosis of SARS-CoV-2 by FDA under an Emergency Use Authorization (EUA).  This EUA will remain in effect (meaning this test can be used) for the duration of the COVID-19 declaration under Section 564(b)(1) of the Act, 21 U.S.C. section 360bbb-3(b)(1), unless the authorization is terminated or revoked sooner. Performed at [facility], [place] [address] [place], [place] [address]         Radiology Studies: No results found.      Scheduled Meds: . Chlorhexidine Gluconate Cloth  6 each Topical Daily  . dexamethasone  4 mg Oral Q8H  . gabapentin  300 mg Oral BID  . gabapentin  300 mg Oral Once  . levothyroxine  25 mcg Oral QAC breakfast  . metoCLOPramide (REGLAN) injection  5 mg Intravenous Q6H  . prednisoLONE acetate  1 drop Both Eyes Daily   Continuous Infusions: . dextrose 5 % and 0.45% NaCl 75 mL/hr at 09/08/19 1109     LOS: 10 days     Cordelia Poche, MD [facility] 09/08/2019, 8:06 PM  If 7PM-7AM, please contact  night-coverage [email or web address]

## 2019-09-08 NOTE — Progress Notes (Signed)
Daily Progress Note   Patient Name: Elizabeth [name]       Date: 09/08/2019 DOB: Sep 03, 1955  Age: 64 y.o. MRN#: [id number] Attending Physician: Mariel Aloe, MD Primary Care Physician: [name], PA Admit Date: 08/29/2019  Reason for Consultation/Follow-up: Establishing goals of care  Subjective:   Ms. Volcy is resting in bed, her husband is at the bedside today.  He asks about family concerns for possible UTI, we talked about pain and non pain symptom management. We talked about a bowel regimen as well, concerns for constipation.     Length of Stay: 10  Current Medications: Scheduled Meds:  . Chlorhexidine Gluconate Cloth  6 each Topical Daily  . dexamethasone  4 mg Oral Q8H  . gabapentin  300 mg Oral BID  . gabapentin  300 mg Oral Once  . levothyroxine  25 mcg Oral QAC breakfast  . metoCLOPramide (REGLAN) injection  5 mg Intravenous Q6H  . prednisoLONE acetate  1 drop Both Eyes Daily    Continuous Infusions: . dextrose 5 % and 0.45% NaCl 75 mL/hr at 09/08/19 0100    PRN Meds: HYDROmorphone (DILAUDID) injection, lip balm, LORazepam, ondansetron **OR** ondansetron (ZOFRAN) IV, oxyCODONE-acetaminophen **AND** oxyCODONE, sodium chloride flush  Physical Exam         Limited exam as patient was resting in bed. Does not arouse to gentle stimulation.  Appears to be breathing comfortably No distress   Vital Signs: BP (!) 148/96 (BP Location: Right Arm) Comment: RN notified  Pulse (!) 109   Temp 98.3 F (36.8 C) (Oral)   Resp 17   Ht 5\' 3"  (1.6 m)   Wt 43.5 kg   SpO2 96%   BMI 17.01 kg/m  SpO2: SpO2: 96 % O2 Device: O2 Device: Nasal Cannula O2 Flow Rate: O2 Flow Rate (L/min): 2 L/min  Intake/output summary:   Intake/Output Summary (Last 24 hours) at 09/08/2019  0958 Last data filed at 09/08/2019 0100 Gross per 24 hour  Intake 645 ml  Output --  Net 645 ml   LBM: Last BM Date: 08/31/19 Baseline Weight: Weight: 43.5 kg Most recent weight: Weight: 43.5 kg   Patient Active Problem List   Diagnosis Date Noted  . Pleural effusion   . Vasogenic brain edema ([place])   . Brain metastasis ([place]) 08/29/2019  . Malignant  neoplasm of lower-outer quadrant of left breast of female, estrogen receptor positive ([place]) 10/21/2018  . Goals of care, counseling/discussion 10/21/2018  . Bone metastasis ([place]) 12/22/2017  . Primary osteoarthritis of left hip 05/26/2017  . Osteoarthritis of left hip 05/23/2017  . Breast cancer of lower-outer quadrant of left female breast ([place]) 08/06/2012    Palliative Care Assessment & Plan   Patient Profile: 64 year old female with metastatic breast cancer with newly discovered brain mets (currently getting radiation therapy) with continued functional decline.  She is deemed not a candidate for further systemic therapy.  Recommendations/Plan: -Pain:  Continue oxycodone p.o. or Dilaudid IV as needed for breakthrough pain -Nausea:   scheduled Reglan.  Recommend continue same. -Continue current interventions for now, as per family wishes. Continue radiation treatments.  Augment bowel regimen.  Discussed with husband at bedside about patient possibly benefiting from hospice towards the end of this hospitalization.   Code Status:    Code Status Orders  (From admission, onward)         Start     Ordered   09/03/19 1552  Do not attempt resuscitation (DNR)  Continuous    Question Answer Comment  In the event of cardiac or respiratory ARREST Do not call a "code blue"   In the event of cardiac or respiratory ARREST Do not perform Intubation, CPR, defibrillation or ACLS   In the event of cardiac or respiratory ARREST Use medication by any route, position, wound care, and other measures to relive pain and suffering. May use oxygen,  suction and manual treatment of airway obstruction as needed for comfort.      09/03/19 1551        Code Status History    Date Active Date Inactive Code Status Order ID Comments User Context   08/29/2019 2242 09/03/2019 1551 Full Code [id number]  Rise Patience, MD ED   05/26/2017 1021 05/28/2017 1500 Full Code [id number]  Leighton Parody, PA-C Inpatient   Advance Care Planning Activity    Advance Directive Documentation     Most Recent Value  Type of Advance Directive  Healthcare Power of Attorney  Pre-existing out of facility DNR order (yellow form or pink MOST form)  --  "MOST" Form in Place?  --       Prognosis:   Guarded  Discharge Planning:  To Be Determined.  Consideration was for residential hospice, however, she has shown improvement over the weekend.  Will likely need more time to determine her clinical trajectory before making plans for discharge.  Care plan was discussed with patient and husband  Thank you for allowing the Palliative Medicine Team to assist in the care of this patient.   Time In: 9 Time Out: 9.35 Total Time 35 Prolonged Time Billed  No      Greater than 50%  of this time was spent counseling and coordinating care related to the above assessment and plan.  Loistine Chance, MD  Please contact Palliative Medicine Team phone at [phone] for questions and concerns.

## 2019-09-09 ENCOUNTER — Ambulatory Visit
Admit: 2019-09-09 | Discharge: 2019-09-09 | Disposition: A | Discharge status: Home / Self Care | Payer: Medicare Other | Attending: Radiation Oncology | Admitting: Radiation Oncology

## 2019-09-09 NOTE — TOC Initial Note (Signed)
Transition of Care [facility]) - Initial/Assessment Note    Patient Details  Name: VAIDEHI BRADDY MRN: [id number] Date of Birth: 1955-05-03  [facility] CM/SW Contact:    Trish Mage, LCSW Phone Number: 09/09/2019, 3:47 PM  Clinical Narrative:  Met with patient's husband per RN request. He wanted to talk about options going forward while admitting he is not sure of how the family wishes to proceed.  He did acknowledge that the patient wants to return home, but is interested in knowing if short term rehab would be an option.  PT has recommended SNF, so I told him we could see if we get a bed offer.  He identified [facility] as the closest facility, and is hopeful that would be an option.  Bed search initiated. TOC will continue to follow during the course of hospitalization.                   Expected Discharge Plan: Skilled Nursing Facility Barriers to Discharge: SNF Pending bed offer   Patient Goals and CMS Choice     Choice offered to / list presented to : Spouse  Expected Discharge Plan and Services Expected Discharge Plan: [place]   Discharge Planning Services: CM Consult Post Acute Care Choice: [place] Living arrangements for the past 2 months: Single Family Home                                      Prior Living Arrangements/Services Living arrangements for the past 2 months: Single Family Home Lives with:: Spouse, Adult Children Patient language and need for interpreter reviewed:: Yes        Need for Family Participation in Patient Care: Yes (Comment) Care giver support system in place?: Yes (comment)   Criminal Activity/Legal Involvement Pertinent to Current Situation/Hospitalization: No - Comment as needed  Activities of Daily Living Home Assistive Devices/Equipment: None ADL Screening (condition at time of admission) Patient's cognitive ability adequate to safely complete daily activities?: Yes Is the patient  deaf or have difficulty hearing?: No Does the patient have difficulty seeing, even when wearing glasses/contacts?: No Does the patient have difficulty concentrating, remembering, or making decisions?: No Patient able to express need for assistance with ADLs?: Yes Does the patient have difficulty dressing or bathing?: No Independently performs ADLs?: Yes (appropriate for developmental age) Does the patient have difficulty walking or climbing stairs?: No Weakness of Legs: None Weakness of Arms/Hands: None  Permission Sought/Granted                  Emotional Assessment Appearance:: Appears stated age Attitude/Demeanor/Rapport: Unable to Assess Affect (typically observed): Unable to Assess Orientation: : Oriented to Self Alcohol / Substance Use: Not Applicable Psych Involvement: No (comment)  Admission diagnosis:  Vasogenic brain edema (HCC) [G93.6] Brain metastasis (HCC) [C79.31] Metastasis from breast cancer ([place]) [C79.9, C50.919] Patient Active Problem List   Diagnosis Date Noted  . Pleural effusion   . Vasogenic brain edema ([place])   . Brain metastasis ([place]) 08/29/2019  . Malignant neoplasm of lower-outer quadrant of left breast of female, estrogen receptor positive ([place]) 10/21/2018  . Goals of care, counseling/discussion 10/21/2018  . Bone metastasis ([place]) 12/22/2017  . Primary osteoarthritis of left hip 05/26/2017  . Osteoarthritis of left hip 05/23/2017  . Breast cancer of lower-outer quadrant of left female breast ([place]) 08/06/2012   PCP:  [name],  PA Pharmacy:   [id number] [place] [address]Phone: [phone]: [phone] [place]Phone: [phone]: [phone] [facility] [address] [place]:  [phone]: [address] [address]Phone: [phone]: [phone] [facility] ([address]Phone: [phone]: [phone]    Social Determinants of Health (SDOH) Interventions    Readmission Risk Interventions No flowsheet data found.

## 2019-09-09 NOTE — Progress Notes (Signed)
PROGRESS NOTE    Elizabeth [name]  [id number] DOB: 03/10/1956 DOA: 08/29/2019 PCP: [name], PA   Brief Narrative: Elizabeth [name] is a 64 y.o. female with a past medical history significant for but not limited to metastatic left sided breast cancer, hypothyroidism, hypercholesterolemia, history of migraines, vertigo. Patient presented secondary to dizziness and frequent falls found to have evidence of metastatic brain lesions with associated vasogenic edema and mass effect. Patient started on IV steroids and radiation therapy with some relative improvement in symptoms.   Assessment & Plan:   Principal Problem:   Brain metastasis ([place]) Active Problems:   Malignant neoplasm of lower-outer quadrant of left breast of female, estrogen receptor positive (HCC)   Pleural effusion   Vasogenic brain edema (HCC)   Metastatic brain lesions Dizziness Vasogenic edema with associated mass effect seen on MRI. Patient started on Decadron IV and radiation therapy.  -Radiation therapy per radiation oncology; plan for 10 treatments -Continue Decadron 4 mg PO  Hypokalemia Resolved with repletion.  Hypothyroidism TSH of 1.054 -Continue synthroid  Right pleural effusion Patient underwent thoracentesis on 5/17 yielding 700 mL of yellow fluid which was only sent for pathology; pathology significant for inflammation with no mention of malignant cells. Currently stable on oxygen. No respiratory distress.  Leukocytosis No obvious evidence of infection however, patient does voice symptoms of dysuria. Complicated by steroid use. Unlikely leukocytosis is secondary to possible UTI. Urinalysis unremarkable.  Dysuria Per patient. Some hesitancy noted by nursing. Urinalysis appears benign. Still with some mild pain with urination.  Anemia of chronic disease Stable  Thrombocytosis In setting of cancer/inflammation. Stable.  Hyponatremia Mild. Possibly secondary to hypotonic IV fluids.  Resolved.  Metastatic breast cancer Patient with metastasis as mentioned above. Previously on chemotherapy, however, oncology now recommending hospice.  Pre-diabetes Hemoglobin A1C of 6.3%. Recommend diet control.  Somnolence Possibly secondary to medication but concern is this could be related to metastatic brain lesions. Earlier in course, this may have been the case. Appears to have improved with radiation therapy but is somnolent in setting of Ativan.   DVT prophylaxis: SCDs Code Status:   Code Status: DNR Family Communication: Daughter at bedside Disposition Plan: Discharge home pending completion of radiation therapy as patient is currently unlikely to be able to transport easily from  Home to radiation as an outpatient. Patient is also not mentally at baseline.   Consultants:   Radiation oncology  Medical oncology  Palliative care  Interventional radiology  Procedures:   Radiation therapy  Antimicrobials:  Ceftriaxone    Subjective: Some pain but it is controlled with analgesics.   Objective: Vitals:   09/08/19 1500 09/08/19 2059 09/09/19 0623 09/09/19 1508  BP: 135/89 (!) 150/101 (!) 138/96 (!) 143/84  Pulse: 98 96 96 100  Resp: 18 18 15 18   Temp: 98.1 F (36.7 C) 97.6 F (36.4 C) 97.7 F (36.5 C) 99 F (37.2 C)  TempSrc: Oral Oral Oral Oral  SpO2: 100% 99% 100% 100%  Weight:      Height:        Intake/Output Summary (Last 24 hours) at 09/09/2019 1733 Last data filed at 09/08/2019 1800 Gross per 24 hour  Intake 1263.19 ml  Output --  Net 1263.19 ml   Filed Weights   08/29/19 1459  Weight: 43.5 kg    Examination:  General exam: Appears calm and comfortable Respiratory system: Respiratory effort normal. Cardiovascular system: S1 & S2 heard, RRR. No murmurs, rubs, gallops or clicks. Gastrointestinal system: Abdomen is  nondistended, soft and nontender. Normal bowel sounds heard. Central nervous system: Alert and oriented.  Musculoskeletal:  Edema. No calf tenderness Skin:  No rashes. Mild cyanosis of left hand fingertips that resolved with manipulation of left hand glove Psychiatry: Judgement and insight appear normal. Mood & affect appropriate.     Data Reviewed: I have personally reviewed following labs and imaging studies  Last CBC Lab Results  Component Value Date   WBC 10.9 (H) 09/08/2019   RBC 3.26 (L) 09/08/2019   HGB 10.4 (L) 09/08/2019   HCT 32.0 (L) 09/08/2019   MCV 98.2 09/08/2019   MCH 31.9 09/08/2019   PLT 508 (H) 09/08/2019   MCHC 32.5 09/08/2019   RDW 14.8 09/08/2019   LYMPHSABS 0.3 (L) 09/08/2019   MONOABS 0.7 09/08/2019   EOSABS 0.0 09/08/2019   BASOSABS 0.0 99991111     Last metabolic panel Lab Results  Component Value Date   NA 135 09/08/2019   K 3.5 09/08/2019   CL 102 09/08/2019   CO2 26 09/08/2019   BUN 12 09/08/2019   CREATININE 0.59 09/08/2019   GLUCOSE 128 (H) 09/08/2019   GFRNONAA >60 09/08/2019   GFRAA >60 09/08/2019   CALCIUM 8.0 (L) 09/08/2019   PHOS 2.5 09/08/2019   PROT 5.4 (L) 09/08/2019   ALBUMIN 2.9 (L) 09/08/2019   BILITOT 0.5 09/08/2019   ALKPHOS 76 09/08/2019   AST 33 09/08/2019   ALT 22 09/08/2019   ANIONGAP 7 09/08/2019    CBG (last 3)  No results for input(s): GLUCAP in the last 72 hours.   GFR: Estimated Creatinine Clearance: 48.1 mL/min (by C-G formula based on SCr of 0.59 mg/dL).  Coagulation Profile: No results for input(s): INR, PROTIME in the last 168 hours.  No results found for this or any previous visit (from the past 240 hour(s)).      Radiology Studies: No results found.      Scheduled Meds: . Chlorhexidine Gluconate Cloth  6 each Topical Daily  . dexamethasone  4 mg Oral Q8H  . gabapentin  300 mg Oral BID  . gabapentin  300 mg Oral Once  . levothyroxine  25 mcg Oral QAC breakfast  . metoCLOPramide (REGLAN) injection  5 mg Intravenous Q6H  . prednisoLONE acetate  1 drop Both Eyes Daily   Continuous Infusions: . dextrose  5 % and 0.45% NaCl 75 mL/hr at 09/08/19 1109     LOS: 11 days     Cordelia Poche, MD [facility] 09/09/2019, 5:33 PM  If 7PM-7AM, please contact night-coverage [email or web address]

## 2019-09-09 NOTE — Progress Notes (Signed)
Physical Therapy Treatment Patient Details Name: Elizabeth [name] MRN: [id number] DOB: [birth year] Today's Date: 09/09/2019    History of Present Illness 64 year old thin chronically ill-appearing Caucasian female with a past medical history significant for but not limited to metastatic left sided breast cancer, hypothyroidism, hypercholesterolemia, history of migraines, vertigo as well as other comorbidities who presented with a chief complaint of dizziness for the last 3 to 4 days with frequent falls.  She denied losing any consciousness however she presented to the ED for these chief complaint and further work-up was done and she had a head CT which showed metastatic lesions and an MRI of the brain which showed multiple metastatic lesions mostly in the cerebellum with mass-effect.  Patient was started on IV Decadron and medical oncology was consulted.  Subsequently radiation oncology was notified and she also went thoracentesis for a right pleural effusion.  Radiation oncology evaluated and felt that she was a candidate for brain radiotherapy was subsequently proceeding with systemic therapy under the care of Dr. Ladona Mow when she has completed radiotherapy.  Radiation oncology recommending a course of 10 fractions with the possibility of their time salvage radiosurgery if needed to the largest lesion in the cerebellum.    PT Comments    Patient making steady progress towards goals with acute PT. She was able to progress mobility with gait training using RW. Pt continues to require assist and multimodal cues for sequencing mobility. Visual cues used during gait to facilitate step length and mod assist needed for walker management and balance. Patient HR reached max of 133 bpm. She will continue to benefit from skilled PT interventions to address impairments and progress mobility as able.    Follow Up Recommendations  SNF;Supervision/Assistance - 24 hour     Equipment Recommendations  Rolling walker  with 5" wheels(vs wheelchair for safety)    Recommendations for Other Services       Precautions / Restrictions Precautions Precautions: Fall Restrictions Weight Bearing Restrictions: No    Mobility  Bed Mobility Overal bed mobility: Needs Assistance Bed Mobility: Supine to Sit     Supine to sit: Min assist;HOB elevated     General bed mobility comments: pt required verbal/tactile cues for sequencing bed mobility to reach for railing and bring LE's off EOB. assist and cues needed to use UE's to scoot to EOB and get feet on floor.  Transfers Overall transfer level: Needs assistance Equipment used: Rolling walker (2 wheeled) Transfers: Sit to/from Stand Sit to Stand: Min assist         General transfer comment: cues for safe hand placement on RW and assist to steady with rising. pt with NBOS in standing and cues needed to widen stance. pt required tactile cues and assist to initiate sitting and reach back to recliner after gait.  Ambulation/Gait Ambulation/Gait assistance: Min assist;Mod assist Gait Distance (Feet): 10 Feet Assistive device: Rolling walker (2 wheeled) Gait Pattern/deviations: Step-to pattern;Decreased stride length;Narrow base of support Gait velocity: decreased   General Gait Details: pt required visual cues to facilitate increased step length with Rt/Lt foot to improve stride. mod assist to manage wallker position and steady during gait. pt' shusand provided chair follow.    Stairs        Wheelchair Mobility    Modified Rankin (Stroke Patients Only)       Balance Overall balance assessment: Needs assistance Sitting-balance support: Feet supported;Single extremity supported;Bilateral upper extremity supported Sitting balance-Leahy Scale: Fair Sitting balance - Comments: pt able to maintain seated  balance while combing hair with 1 UE support. slightly slouched posture.    Standing balance support: Bilateral upper extremity  supported Standing balance-Leahy Scale: Poor Standing balance comment: heavily reliant on external support for standing balance           Cognition Arousal/Alertness: Awake/alert;Lethargic Behavior During Therapy: Flat affect Overall Cognitive Status: Difficult to assess    General Comments: pt lethargic at start communicating with gestures and occasional "mmhm". She was more engaged once sitting and after ambulating responding with short statements "thirsty", "a little bit" when asked if she wanted some banana bread.       Exercises Other Exercises Other Exercises: Digit opposition on Lt hand for edema management. pt with great difficulty coordinating movement pattern. Other Exercises: 10x Lt digit composite flexion for edema managment with towel.     General Comments General comments (skin integrity, edema, etc.): HR resting ~100 bpm. increased to 120's with sit<>stand transfer and reached max of 133bpm with gait.      Pertinent Vitals/Pain Pain Assessment: Faces Faces Pain Scale: Hurts a little bit Pain Location: generalized pain Pain Descriptors / Indicators: Discomfort Pain Intervention(s): Limited activity within patient's tolerance;Monitored during session;Repositioned           PT Goals (current goals can now be found in the care plan section) Acute Rehab PT Goals Patient Stated Goal: to get stronger PT Goal Formulation: Patient unable to participate in goal setting Time For Goal Achievement: 09/16/19(date extended by 1 week) Potential to Achieve Goals: Fair Progress towards PT goals: Progressing toward goals    Frequency    Min 2X/week      PT Plan Current plan remains appropriate    Co-evaluation              AM-PAC PT "6 Clicks" Mobility   Outcome Measure  Help needed turning from your back to your side while in a flat bed without using bedrails?: A Little Help needed moving from lying on your back to sitting on the side of a flat bed without  using bedrails?: A Little Help needed moving to and from a bed to a chair (including a wheelchair)?: A Little Help needed standing up from a chair using your arms (e.g., wheelchair or bedside chair)?: A Little Help needed to walk in hospital room?: A Lot Help needed climbing 3-5 steps with a railing? : Total 6 Click Score: 15    End of Session Equipment Utilized During Treatment: Gait belt;Oxygen Activity Tolerance: Patient tolerated treatment well;Patient limited by lethargy Patient left: in chair;with family/visitor present;with chair alarm set Nurse Communication: Mobility status PT Visit Diagnosis: Other abnormalities of gait and mobility (R26.89);Unsteadiness on feet (R26.81)     Time: [id number] PT Time Calculation (min) (ACUTE ONLY): 32 min  Charges:  $Gait Training: 8-22 mins $Therapeutic Activity: 8-22 mins                    Verner Mould, DPT Physical Therapist with [facility] [phone]  09/09/2019 5:50 PM

## 2019-09-09 NOTE — NC FL2 (Signed)
[place] LEVEL OF CARE SCREENING TOOL     IDENTIFICATION  Patient Name: Elizabeth [name] Birthdate: 10-17-1955 Sex: female Admission Date (Current Location): 08/29/2019  [facility] and [place] Number:  Herbalist and Address:  [facility],  [place] [place], [place]      Provider Number: [id number]  Attending Physician Name and Address:  Mariel Aloe, MD  Relative Name and Phone Number:  Jaydalyn Service, spouse, [place]    Current Level of Care: Hospital Recommended Level of Care: [place] Prior Approval Number:    Date Approved/Denied:   PASRR Number: [id number]  Discharge Plan: SNF    Current Diagnoses: Patient Active Problem List   Diagnosis Date Noted  . Pleural effusion   . Vasogenic brain edema ([place])   . Brain metastasis ([place]) 08/29/2019  . Malignant neoplasm of lower-outer quadrant of left breast of female, estrogen receptor positive ([place]) 10/21/2018  . Goals of care, counseling/discussion 10/21/2018  . Bone metastasis ([place]) 12/22/2017  . Primary osteoarthritis of left hip 05/26/2017  . Osteoarthritis of left hip 05/23/2017  . Breast cancer of lower-outer quadrant of left female breast ([place]) 08/06/2012    Orientation RESPIRATION BLADDER Height & Weight     Self  Normal External catheter Weight: 43.5 kg Height:  5\' 3"  (160 cm)  BEHAVIORAL SYMPTOMS/MOOD NEUROLOGICAL BOWEL NUTRITION STATUS  (none) (none) Continent Diet(see d/c summary)  AMBULATORY STATUS COMMUNICATION OF NEEDS Skin   Extensive Assist Verbally Normal                       Personal Care Assistance Level of Assistance  Bathing, Feeding, Dressing Bathing Assistance: Maximum assistance Feeding assistance: Limited assistance Dressing Assistance: Limited assistance     Functional Limitations Info  Sight, Hearing, Speech Sight Info: Adequate Hearing Info: Adequate Speech Info: Adequate    SPECIAL CARE FACTORS FREQUENCY   PT (By licensed PT), OT (By licensed OT)     PT Frequency: 5X/W OT Frequency: 5X/W            Contractures Contractures Info: Not present    Additional Factors Info  Code Status, Allergies Code Status Info: DNR Allergies Info: Shellfish, Sulfa antibiotics           Current Medications (09/09/2019):  This is the current hospital active medication list Current Facility-Administered Medications  Medication Dose Route Frequency Provider Last Rate Last Admin  . bisacodyl (DULCOLAX) suppository 10 mg  10 mg Rectal Daily PRN Loistine Chance, MD      . Chlorhexidine Gluconate Cloth 2 % PADS 6 each  6 each Topical Daily Rise Patience, MD   6 each at 09/09/19 [phone]  . dexamethasone (DECADRON) tablet 4 mg  4 mg Oral Q8H Hayden Pedro, PA-C   4 mg at 09/09/19 1330  . dextrose 5 %-0.45 % sodium chloride infusion   Intravenous Continuous Raiford Noble [place], [place] 75 mL/hr at 09/08/19 1109 New Bag at 09/08/19 1109  . gabapentin (NEURONTIN) capsule 300 mg  300 mg Oral BID Rise Patience, MD   300 mg at 09/09/19 [id number]  . gabapentin (NEURONTIN) capsule 300 mg  300 mg Oral Once Merlene Laughter F, NP      . HYDROmorphone (DILAUDID) injection 0.5 mg  0.5 mg Intravenous Q2H PRN Micheline Rough, MD   0.5 mg at 09/08/19 1358  . levothyroxine (SYNTHROID) tablet 25 mcg  25 mcg Oral QAC breakfast Rise Patience, MD  25 mcg at 09/08/19 0544  . lip balm (CARMEX) ointment   Topical PRN Raiford Noble [place], DO   Given at 09/03/19 1955  . LORazepam (ATIVAN) tablet 0.5 mg  0.5 mg Oral Daily PRN Mariel Aloe, MD      . metoCLOPramide (REGLAN) injection 5 mg  5 mg Intravenous Q6H Domingo Cocking, Gene, MD   5 mg at 09/09/19 1330  . ondansetron (ZOFRAN) tablet 4 mg  4 mg Oral Q6H PRN Rise Patience, MD   4 mg at 09/02/19 1125   Or  . ondansetron (ZOFRAN) injection 4 mg  4 mg Intravenous Q6H PRN Rise Patience, MD   4 mg at 09/02/19 1758  . oxyCODONE-acetaminophen (PERCOCET/ROXICET) 5-325  MG per tablet 1 tablet  1 tablet Oral Q8H PRN Rise Patience, MD   1 tablet at 09/07/19 2215   And  . oxyCODONE (Oxy IR/ROXICODONE) immediate release tablet 5 mg  5 mg Oral Q8H PRN Rise Patience, MD   5 mg at 09/09/19 1021  . prednisoLONE acetate (PRED FORTE) 1 % ophthalmic suspension 1 drop  1 drop Both Eyes Daily Rise Patience, MD   1 drop at 09/09/19 [phone]  . sodium chloride flush (NS) 0.9 % injection 10-40 mL  10-40 mL Intracatheter PRN Rise Patience, MD   10 mL at 09/04/19 0501     Discharge Medications: Please see discharge summary for a list of discharge medications.  Relevant Imaging Results:  Relevant Lab Results:   Additional Information [place], [place]

## 2019-09-10 ENCOUNTER — Ambulatory Visit
Admit: 2019-09-10 | Discharge: 2019-09-10 | Disposition: A | Discharge status: Home / Self Care | Payer: Medicare Other | Attending: Radiation Oncology | Admitting: Radiation Oncology

## 2019-09-10 LAB — URINE CULTURE

## 2019-09-10 MED ORDER — POLYETHYLENE GLYCOL 3350 17 G PO PACK
17.0000 g | PACK | Freq: Two times a day (BID) | ORAL | Status: DC
  Administered 2019-09-10 – 2019-09-15 (×10): 17 g via ORAL
  Filled 2019-09-10 (×11): qty 1

## 2019-09-10 MED ORDER — OXYCODONE HCL ER 10 MG PO T12A
10.0000 mg | EXTENDED_RELEASE_TABLET | Freq: Two times a day (BID) | ORAL | Status: DC
  Administered 2019-09-10 – 2019-09-15 (×11): 10 mg via ORAL
  Filled 2019-09-10 (×11): qty 1

## 2019-09-10 MED ORDER — AMLODIPINE BESYLATE 5 MG PO TABS
5.0000 mg | ORAL_TABLET | Freq: Every day | ORAL | Status: DC
  Administered 2019-09-10 – 2019-09-15 (×6): 5 mg via ORAL
  Filled 2019-09-10 (×6): qty 1

## 2019-09-10 MED ORDER — SENNOSIDES-DOCUSATE SODIUM 8.6-50 MG PO TABS
1.0000 | ORAL_TABLET | Freq: Two times a day (BID) | ORAL | Status: DC
  Administered 2019-09-10 – 2019-09-13 (×7): 1 via ORAL
  Filled 2019-09-10 (×7): qty 1

## 2019-09-10 NOTE — Care Management Important Message (Signed)
Important Message  Patient Details IM Letter given to the Case Manager to present to the Patient Name: Elizabeth [name] MRN: [id number] Date of Birth: 1956/02/10   Medicare Important Message Given:  Yes     Kerin Salen 09/10/2019, 4:41 PM

## 2019-09-10 NOTE — Progress Notes (Signed)
PROGRESS NOTE    Elizabeth [name]  [id number] DOB: [birth year] DOA: 08/29/2019 PCP: [name], PA   Brief Narrative: Elizabeth [name] is a 64 y.o. female with a past medical history significant for but not limited to metastatic left sided breast cancer, hypothyroidism, hypercholesterolemia, history of migraines, vertigo. Patient presented secondary to dizziness and frequent falls found to have evidence of metastatic brain lesions with associated vasogenic edema and mass effect. Patient started on IV steroids and radiation therapy with some relative improvement in symptoms.   Assessment & Plan:   Principal Problem:   Brain metastasis ([place]) Active Problems:   Malignant neoplasm of lower-outer quadrant of left breast of female, estrogen receptor positive ([facility])   Pleural effusion   Vasogenic brain edema ([facility])   Metastatic brain lesions Dizziness Vasogenic edema with associated mass effect seen on MRI. Patient started on Decadron IV and radiation therapy.  -Radiation therapy per radiation oncology; plan for 10 treatments -Continue Decadron 4 mg PO -Continue oxycodone prn -Will start Oxycontin 10 mg BID (discussed with palliative care medicine) to more consistently control pain -Senokot-S and Miralax for bowel regimen  Hypokalemia Resolved with repletion.  Hypothyroidism TSH of 1.054 -Continue synthroid  Right pleural effusion Patient underwent thoracentesis on 5/17 yielding 700 mL of yellow fluid which was only sent for pathology; pathology significant for inflammation with no mention of malignant cells. Currently stable on oxygen. No respiratory distress.  Leukocytosis No obvious evidence of infection however, patient does voice symptoms of dysuria. Complicated by steroid use. Unlikely leukocytosis is secondary to possible UTI. Urinalysis unremarkable.  Dysuria Per patient. Some hesitancy noted by nursing. Urinalysis appears benign. Still with some mild pain with  urination.  Anemia of chronic disease Stable  Thrombocytosis In setting of cancer/inflammation. Stable.  Hyponatremia Mild. Possibly secondary to hypotonic IV fluids. Resolved.  Metastatic breast cancer Patient with metastasis as mentioned above. Previously on chemotherapy, however, oncology now recommending hospice.  Pre-diabetes Hemoglobin A1C of 6.3%. Recommend diet control.  Somnolence Possibly secondary to medication but concern is this could be related to metastatic brain lesions. Earlier in course, this may have been the case. Appears to have improved with radiation therapy but is somnolent in setting of Ativan. Since adjusting Ativan dosing, patient has remained alert.   DVT prophylaxis: SCDs Code Status:   Code Status: DNR Family Communication: Daughter at bedside Disposition Plan: Discharge to SNF pending completion of radiation therapy as patient is currently unlikely to be able to transport easily from  SNF to radiation as an outpatient.  Consultants:   Radiation oncology  Medical oncology  Palliative care  Interventional radiology  Procedures:   Radiation therapy  Antimicrobials:  Ceftriaxone    Subjective: Continues to have peaks of pain but eventually gets good relief with prn oxycodone.  Objective: Vitals:   09/09/19 0623 09/09/19 1508 09/09/19 2034 09/10/19 0643  BP: (!) 138/96 (!) 143/84 (!) 144/88 (!) 153/101  Pulse: 96 100 84 85  Resp: 15 18 16 16   Temp: 97.7 F (36.5 C) 99 F (37.2 C) 97.9 F (36.6 C) 98 F (36.7 C)  TempSrc: Oral Oral  Oral  SpO2: 100% 100% 100% 100%  Weight:      Height:        Intake/Output Summary (Last 24 hours) at 09/10/2019 1144 Last data filed at 09/09/2019 1230 Gross per 24 hour  Intake 120 ml  Output --  Net 120 ml   Filed Weights   08/29/19 1459  Weight: 43.5 kg  Examination:  General exam: Appears calm and comfortable Respiratory system: Diminished to auscultation. Respiratory effort  normal. Cardiovascular system: S1 & S2 heard, RRR. No murmurs, rubs, gallops or clicks. Gastrointestinal system: Abdomen is nondistended, soft and mildly tender. Normal bowel sounds heard. Central nervous system: Alert and oriented. No focal neurological deficits. Musculoskeletal: No edema. No calf tenderness Skin: No cyanosis. No rashes Psychiatry: Judgement and insight appear normal. Mood & affect appropriate.     Data Reviewed: I have personally reviewed following labs and imaging studies  Last CBC Lab Results  Component Value Date   WBC 10.9 (H) 09/08/2019   RBC 3.26 (L) 09/08/2019   HGB 10.4 (L) 09/08/2019   HCT 32.0 (L) 09/08/2019   MCV 98.2 09/08/2019   MCH 31.9 09/08/2019   PLT 508 (H) 09/08/2019   MCHC 32.5 09/08/2019   RDW 14.8 09/08/2019   LYMPHSABS 0.3 (L) 09/08/2019   MONOABS 0.7 09/08/2019   EOSABS 0.0 09/08/2019   BASOSABS 0.0 [id number]     Last metabolic panel Lab Results  Component Value Date   NA 135 09/08/2019   K 3.5 09/08/2019   CL 102 09/08/2019   CO2 26 09/08/2019   BUN 12 09/08/2019   CREATININE 0.59 09/08/2019   GLUCOSE 128 (H) 09/08/2019   GFRNONAA >60 09/08/2019   GFRAA >60 09/08/2019   CALCIUM 8.0 (L) 09/08/2019   PHOS 2.5 09/08/2019   PROT 5.4 (L) 09/08/2019   ALBUMIN 2.9 (L) 09/08/2019   BILITOT 0.5 09/08/2019   ALKPHOS 76 09/08/2019   AST 33 09/08/2019   ALT 22 09/08/2019   ANIONGAP 7 09/08/2019    CBG (last 3)  No results for input(s): GLUCAP in the last 72 hours.   GFR: Estimated Creatinine Clearance: 48.1 mL/min (by C-G formula based on SCr of 0.59 mg/dL).  Coagulation Profile: No results for input(s): INR, PROTIME in the last 168 hours.  Recent Results (from the past 240 hour(s))  Culture, Urine     Status: Abnormal   Collection Time: 09/08/19  3:04 PM   Specimen: Urine, Clean Catch  Result Value Ref Range Status   Specimen Description   Final    URINE, CLEAN CATCH Performed at [facility],  [place] [address] [place], [place] [address]    Special Requests   Final    NONE Performed at [facility], [place] [address] [place], [place] [address]    Culture MULTIPLE SPECIES PRESENT, SUGGEST RECOLLECTION (A)  Final   Report Status 09/10/2019 FINAL  Final        Radiology Studies: No results found.      Scheduled Meds: . amLODipine  5 mg Oral Daily  . Chlorhexidine Gluconate Cloth  6 each Topical Daily  . dexamethasone  4 mg Oral Q8H  . gabapentin  300 mg Oral BID  . gabapentin  300 mg Oral Once  . levothyroxine  25 mcg Oral QAC breakfast  . metoCLOPramide (REGLAN) injection  5 mg Intravenous Q6H  . prednisoLONE acetate  1 drop Both Eyes Daily   Continuous Infusions: . dextrose 5 % and 0.45% NaCl 75 mL/hr at 09/08/19 1109     LOS: 12 days     Cordelia Poche, MD [facility] 09/10/2019, 11:44 AM  If 7PM-7AM, please contact night-coverage [email or web address]

## 2019-09-10 NOTE — Progress Notes (Signed)
OT Cancellation Note  Patient Details Name: Elizabeth [name] MRN: [id number] DOB: [birth year]   Cancelled Treatment:    Reason Eval/Treat Not Completed: Patient at procedure or test/ unavailable  [name] L [name] 09/10/2019, 4:07 PM

## 2019-09-11 DIAGNOSIS — Z515 Encounter for palliative care: Secondary | ICD-10-CM

## 2019-09-11 LAB — CBC
HCT: 34.3 % — ABNORMAL LOW (ref 36.0–46.0)
Hemoglobin: 11.4 g/dL — ABNORMAL LOW (ref 12.0–15.0)
MCH: 31.9 pg (ref 26.0–34.0)
MCHC: 33.2 g/dL (ref 30.0–36.0)
MCV: 96.1 fL (ref 80.0–100.0)
Platelets: 494 10*3/uL — ABNORMAL HIGH (ref 150–400)
RBC: 3.57 MIL/uL — ABNORMAL LOW (ref 3.87–5.11)
RDW: 14.6 % (ref 11.5–15.5)
WBC: 11.7 10*3/uL — ABNORMAL HIGH (ref 4.0–10.5)
nRBC: 0 % (ref 0.0–0.2)

## 2019-09-11 LAB — BASIC METABOLIC PANEL
Anion gap: 8 (ref 5–15)
BUN: 15 mg/dL (ref 8–23)
CO2: 26 mmol/L (ref 22–32)
Calcium: 8.4 mg/dL — ABNORMAL LOW (ref 8.9–10.3)
Chloride: 101 mmol/L (ref 98–111)
Creatinine, Ser: 0.58 mg/dL (ref 0.44–1.00)
GFR calc Af Amer: >60 mL/min (ref >60)
GFR calc non Af Amer: >60 mL/min (ref >60)
Glucose, Bld: 122 mg/dL — ABNORMAL HIGH (ref 70–99)
Potassium: 3.5 mmol/L (ref 3.5–5.1)
Sodium: 135 mmol/L (ref 135–145)

## 2019-09-11 NOTE — Progress Notes (Signed)
PROGRESS NOTE    Elizabeth [name]  [id number] DOB: [birth year] DOA: 08/29/2019 PCP: [name], PA   Brief Narrative: Elizabeth [name] is a 64 y.o. female with a past medical history significant for but not limited to metastatic left sided breast cancer, hypothyroidism, hypercholesterolemia, history of migraines, vertigo. Patient presented secondary to dizziness and frequent falls found to have evidence of metastatic brain lesions with associated vasogenic edema and mass effect. Patient started on IV steroids and radiation therapy with some relative improvement in symptoms.   Assessment & Plan:   Principal Problem:   Brain metastasis ([place]) Active Problems:   Malignant neoplasm of lower-outer quadrant of left breast of female, estrogen receptor positive (HCC)   Pleural effusion   Vasogenic brain edema (HCC)   Metastatic brain lesions Dizziness Vasogenic edema with associated mass effect seen on MRI. Patient started on Decadron IV and radiation therapy.  -Radiation therapy per radiation oncology; plan for 10 treatments -Continue Decadron 4 mg PO -Continue oxycodone prn -Continue Oxycontin 10 mg BID (discussed with palliative care medicine) to more consistently control pain -Senokot-S and Miralax for bowel regimen (no signs/symptoms of obstruction)  Hypokalemia Resolved with repletion.  Hypothyroidism TSH of 1.054 -Continue synthroid  Right pleural effusion Patient underwent thoracentesis on 5/17 yielding 700 mL of yellow fluid which was only sent for pathology; pathology significant for inflammation with no mention of malignant cells. Currently stable on oxygen. No respiratory distress.  Leukocytosis No obvious evidence of infection however, patient does voice symptoms of dysuria which have resolved without antibiotics. Complicated by steroid use. Unlikely leukocytosis is secondary to possible UTI. Urinalysis unremarkable. Urine culture with multiple species.  Dysuria Per  patient. Some hesitancy noted by nursing. Urinalysis appears benign. Still with some mild pain with urination.  Anemia of chronic disease Stable  Thrombocytosis In setting of cancer/inflammation. Stable.  Hyponatremia Mild. Possibly secondary to hypotonic IV fluids. Resolved.  Metastatic breast cancer Patient with metastasis as mentioned above. Previously on chemotherapy, however, oncology now recommending hospice.  Pre-diabetes Hemoglobin A1C of 6.3%. Recommend diet control.  Elevated blood pressure -Amlodipine 5 mg daily; increase as needed  Somnolence Possibly secondary to medication but concern is this could be related to metastatic brain lesions. Earlier in course, this may have been the case. Appears to have improved with radiation therapy but is somnolent in setting of Ativan. Since adjusting Ativan dosing, patient has remained alert.   DVT prophylaxis: SCDs Code Status:   Code Status: DNR Family Communication: None at bedside Disposition Plan: Discharge to SNF pending completion of radiation therapy as patient is currently unlikely to be able to transport easily from  SNF to radiation as an outpatient.  Consultants:   Radiation oncology  Medical oncology  Palliative care  Interventional radiology  Procedures:   Radiation therapy  Antimicrobials:  Ceftriaxone    Subjective: Pain is still present. No concerns today other than pain.  Objective: Vitals:   09/10/19 0643 09/10/19 1744 09/10/19 2145 09/11/19 0601  BP: (!) 153/101 129/79 (!) 150/94 (!) 139/98  Pulse: 85 89 92 (!) 110  Resp: 16 16 16 18   Temp: 98 F (36.7 C) 97.9 F (36.6 C) 98.7 F (37.1 C) 98 F (36.7 C)  TempSrc: Oral Oral Oral Oral  SpO2: 100% 96% 100% 99%  Weight:      Height:        Intake/Output Summary (Last 24 hours) at 09/11/2019 0920 Last data filed at 09/10/2019 1744 Gross per 24 hour  Intake 145 ml  Output --  Net 145 ml   Filed Weights   08/29/19 1459  Weight:  43.5 kg    Examination:  General exam: Appears calm and comfortable Respiratory system: Diminished. Respiratory effort normal. Cardiovascular system: S1 & S2 heard, RRR. No murmurs, rubs, gallops or clicks. Gastrointestinal system: Abdomen is nondistended, soft and nontender. Normal bowel sounds heard. Central nervous system: Alert and oriented. No focal neurological deficits. Musculoskeletal: 1+ LE edema. No calf tenderness Skin: No cyanosis. No rashes Psychiatry: Judgement and insight appear normal. Flat affect.      Data Reviewed: I have personally reviewed following labs and imaging studies  Last CBC Lab Results  Component Value Date   WBC 11.7 (H) 09/11/2019   RBC 3.57 (L) 09/11/2019   HGB 11.4 (L) 09/11/2019   HCT 34.3 (L) 09/11/2019   MCV 96.1 09/11/2019   MCH 31.9 09/11/2019   PLT 494 (H) 09/11/2019   MCHC 33.2 09/11/2019   RDW 14.6 09/11/2019   LYMPHSABS 0.3 (L) 09/08/2019   MONOABS 0.7 09/08/2019   EOSABS 0.0 09/08/2019   BASOSABS 0.0 99991111     Last metabolic panel Lab Results  Component Value Date   NA 135 09/11/2019   K 3.5 09/11/2019   CL 101 09/11/2019   CO2 26 09/11/2019   BUN 15 09/11/2019   CREATININE 0.58 09/11/2019   GLUCOSE 122 (H) 09/11/2019   GFRNONAA >60 09/11/2019   GFRAA >60 09/11/2019   CALCIUM 8.4 (L) 09/11/2019   PHOS 2.5 09/08/2019   PROT 5.4 (L) 09/08/2019   ALBUMIN 2.9 (L) 09/08/2019   BILITOT 0.5 09/08/2019   ALKPHOS 76 09/08/2019   AST 33 09/08/2019   ALT 22 09/08/2019   ANIONGAP 8 09/11/2019    CBG (last 3)  No results for input(s): GLUCAP in the last 72 hours.   GFR: Estimated Creatinine Clearance: 48.1 mL/min (by C-G formula based on SCr of 0.58 mg/dL).  Coagulation Profile: No results for input(s): INR, PROTIME in the last 168 hours.  Recent Results (from the past 240 hour(s))  Culture, Urine     Status: Abnormal   Collection Time: 09/08/19  3:04 PM   Specimen: Urine, Clean Catch  Result Value Ref Range  Status   Specimen Description   Final    URINE, CLEAN CATCH Performed at [facility], [place] [address], [place], [place] [address]    Special Requests   Final    NONE Performed at [facility], [place] [address], [place], [place] [address]    Culture MULTIPLE SPECIES PRESENT, SUGGEST RECOLLECTION (A)  Final   Report Status 09/10/2019 FINAL  Final        Radiology Studies: No results found.      Scheduled Meds: . amLODipine  5 mg Oral Daily  . Chlorhexidine Gluconate Cloth  6 each Topical Daily  . dexamethasone  4 mg Oral Q8H  . gabapentin  300 mg Oral BID  . gabapentin  300 mg Oral Once  . levothyroxine  25 mcg Oral QAC breakfast  . metoCLOPramide (REGLAN) injection  5 mg Intravenous Q6H  . oxyCODONE  10 mg Oral Q12H  . polyethylene glycol  17 g Oral BID  . prednisoLONE acetate  1 drop Both Eyes Daily  . senna-docusate  1 tablet Oral BID   Continuous Infusions:    LOS: 13 days     Cordelia Poche, MD [facility] 09/11/2019, 9:20 AM  If 7PM-7AM, please contact night-coverage [email or web address]

## 2019-09-11 NOTE — Plan of Care (Signed)
  Problem: Nutrition: Goal: Adequate nutrition will be maintained Outcome: Progressing   Problem: Coping: Goal: Level of anxiety will decrease Outcome: Progressing   

## 2019-09-11 NOTE — Progress Notes (Signed)
PMT no charge note.  Received page from bedside RN in the evening on 09-10-19, notifying me of the patient's husband wishing to re discuss goals of care and disposition options with PMT. Patient and family has been followed by PMT in this hospitalization, initially with Dr. Domingo Cocking.   I spoke with Elizabeth [name] on the phone. We talked about patient's ongoing radiation, current hospital course. Goals, wishes and values important to the patient and family as a unit attempted to be explored. Elizabeth [name] states that he wishes to complete radiation treatments and is thankful for rad onc Dr Ida Rogue care of the patient. Elizabeth [name] states that patient has told him she is not ready to "throw in the towel" just yet.   Elizabeth [name] states that based off of PT's recommendations, he has started discussions with CSW here in the hospital about SNF rehab attempt.   Plan: Complete radiation Encourage PO, OOB SNF rehab with palliative on discharge.  Outpatient med onc follow up.   No charge Loistine Chance MD [facility] palliative [phone].

## 2019-09-12 NOTE — Progress Notes (Signed)
PROGRESS NOTE    Elizabeth [name]  [id number] DOB: 11/26/1955 DOA: 08/29/2019 PCP: [name], PA   Brief Narrative: Elizabeth [name] is a 64 y.o. female with a past medical history significant for but not limited to metastatic left sided breast cancer, hypothyroidism, hypercholesterolemia, history of migraines, vertigo. Patient presented secondary to dizziness and frequent falls found to have evidence of metastatic brain lesions with associated vasogenic edema and mass effect. Patient started on IV steroids and radiation therapy with some relative improvement in symptoms.   Assessment & Plan:   Principal Problem:   Brain metastasis ([place]) Active Problems:   Malignant neoplasm of lower-outer quadrant of left breast of female, estrogen receptor positive (HCC)   Pleural effusion   Vasogenic brain edema ([place])   Palliative care by specialist   Metastatic brain lesions Dizziness Vasogenic edema with associated mass effect seen on MRI. Patient started on Decadron IV and radiation therapy.  -Radiation therapy per radiation oncology; plan for 10 treatments -Continue Decadron 4 mg PO -Continue oxycodone prn -Continue Oxycontin 10 mg BID (discussed with palliative care medicine) to more consistently control pain -Senokot-S and Miralax for bowel regimen (no signs/symptoms of obstruction)  Hypokalemia Resolved with repletion.  Hypothyroidism TSH of 1.054 -Continue synthroid  Right pleural effusion Patient underwent thoracentesis on 5/17 yielding 700 mL of yellow fluid which was only sent for pathology; pathology significant for inflammation with no mention of malignant cells. Currently stable on oxygen. No respiratory distress.  Leukocytosis No obvious evidence of infection however, patient does voice symptoms of dysuria which have resolved without antibiotics. Complicated by steroid use. Unlikely leukocytosis is secondary to possible UTI. Urinalysis unremarkable. Urine culture with  multiple species. Stable.  Dysuria Per patient. Some hesitancy noted by nursing. Urinalysis appears benign. Resolved symptoms.  Anemia of chronic disease Stable  Thrombocytosis In setting of cancer/inflammation. Stable.  Hyponatremia Mild. Possibly secondary to hypotonic IV fluids. Resolved.  Metastatic breast cancer Patient with metastasis as mentioned above. Previously on chemotherapy, however, oncology now recommending hospice.  Pre-diabetes Hemoglobin A1C of 6.3%. Recommend diet control.  Elevated blood pressure -Amlodipine 5 mg daily; increase as needed  Somnolence Possibly secondary to medication but concern is this could be related to metastatic brain lesions. Earlier in course, this may have been the case. Appears to have improved with radiation therapy but is somnolent in setting of Ativan. Since adjusting Ativan dosing, patient has remained alert.   DVT prophylaxis: SCDs Code Status:   Code Status: DNR Family Communication: None at bedside Disposition Plan: Discharge to SNF pending completion of radiation therapy as patient is currently unlikely to be able to transport easily from SNF to radiation as an outpatient.  Consultants:   Radiation oncology  Medical oncology  Palliative care  Interventional radiology  Procedures:   Radiation therapy  Antimicrobials:  Ceftriaxone    Subjective: Pain is unchanged but she states she is using less as needed narcotics. MAR does not really support this, however.  Objective: Vitals:   09/11/19 0601 09/11/19 1412 09/11/19 1948 09/12/19 0500  BP: (!) 139/98 112/70 (!) 153/90 124/88  Pulse: (!) 110 (!) 108 96 86  Resp: 18 16 16 17   Temp: 98 F (36.7 C) 98.2 F (36.8 C) 97.8 F (36.6 C) (!) 97.5 F (36.4 C)  TempSrc: Oral Oral Oral Oral  SpO2: 99% 100%  96%  Weight:      Height:        Intake/Output Summary (Last 24 hours) at 09/12/2019 1029 Last data  filed at 09/11/2019 1500 Gross per 24 hour  Intake --   Output 300 ml  Net -300 ml   Filed Weights   08/29/19 1459  Weight: 43.5 kg    Examination:  General exam: Appears calm and comfortable Respiratory system: Diminished. Respiratory effort normal. Cardiovascular system: S1 & S2 heard, RRR. No murmurs, rubs, gallops or clicks. Gastrointestinal system: Abdomen is nondistended, soft and nontender. No organomegaly or masses felt. Normal bowel sounds heard. Central nervous system: Alert and oriented. No focal neurological deficits. Musculoskeletal: Left arm and B/L LE edema. No calf tenderness Skin: No cyanosis. No rashes Psychiatry: Judgement and insight appear normal. Withdrawn. Flat affect.     Data Reviewed: I have personally reviewed following labs and imaging studies  Last CBC Lab Results  Component Value Date   WBC 11.7 (H) 09/11/2019   RBC 3.57 (L) 09/11/2019   HGB 11.4 (L) 09/11/2019   HCT 34.3 (L) 09/11/2019   MCV 96.1 09/11/2019   MCH 31.9 09/11/2019   PLT 494 (H) 09/11/2019   MCHC 33.2 09/11/2019   RDW 14.6 09/11/2019   LYMPHSABS 0.3 (L) 09/08/2019   MONOABS 0.7 09/08/2019   EOSABS 0.0 09/08/2019   BASOSABS 0.0 99991111     Last metabolic panel Lab Results  Component Value Date   NA 135 09/11/2019   K 3.5 09/11/2019   CL 101 09/11/2019   CO2 26 09/11/2019   BUN 15 09/11/2019   CREATININE 0.58 09/11/2019   GLUCOSE 122 (H) 09/11/2019   GFRNONAA >60 09/11/2019   GFRAA >60 09/11/2019   CALCIUM 8.4 (L) 09/11/2019   PHOS 2.5 09/08/2019   PROT 5.4 (L) 09/08/2019   ALBUMIN 2.9 (L) 09/08/2019   BILITOT 0.5 09/08/2019   ALKPHOS 76 09/08/2019   AST 33 09/08/2019   ALT 22 09/08/2019   ANIONGAP 8 09/11/2019    CBG (last 3)  No results for input(s): GLUCAP in the last 72 hours.   GFR: Estimated Creatinine Clearance: 48.1 mL/min (by C-G formula based on SCr of 0.58 mg/dL).  Coagulation Profile: No results for input(s): INR, PROTIME in the last 168 hours.  Recent Results (from the past 240 hour(s))   Culture, Urine     Status: Abnormal   Collection Time: 09/08/19  3:04 PM   Specimen: Urine, Clean Catch  Result Value Ref Range Status   Specimen Description   Final    URINE, CLEAN CATCH Performed at [facility], [place] [address] [place], [place] [address]    Special Requests   Final    NONE Performed at [facility], [place] [address], [place], [place] [address]    Culture MULTIPLE SPECIES PRESENT, SUGGEST RECOLLECTION (A)  Final   Report Status 09/10/2019 FINAL  Final        Radiology Studies: No results found.      Scheduled Meds: . amLODipine  5 mg Oral Daily  . Chlorhexidine Gluconate Cloth  6 each Topical Daily  . dexamethasone  4 mg Oral Q8H  . gabapentin  300 mg Oral BID  . gabapentin  300 mg Oral Once  . levothyroxine  25 mcg Oral QAC breakfast  . metoCLOPramide (REGLAN) injection  5 mg Intravenous Q6H  . oxyCODONE  10 mg Oral Q12H  . polyethylene glycol  17 g Oral BID  . prednisoLONE acetate  1 drop Both Eyes Daily  . senna-docusate  1 tablet Oral BID   Continuous Infusions:    LOS: 14 days  Cordelia Poche, MD [facility] 09/12/2019, 10:29 AM  If 7PM-7AM, please contact night-coverage [email or web address]

## 2019-09-13 MED ORDER — SENNOSIDES-DOCUSATE SODIUM 8.6-50 MG PO TABS
2.0000 | ORAL_TABLET | Freq: Two times a day (BID) | ORAL | Status: DC
  Administered 2019-09-13 – 2019-09-15 (×4): 2 via ORAL
  Filled 2019-09-13 (×4): qty 2

## 2019-09-13 NOTE — Progress Notes (Signed)
Daily Progress Note   Patient Name: Elizabeth [name]       Date: 09/13/2019 DOB: 08-10-1955  Age: 64 y.o. MRN#: [id number] Attending Physician: Mariel Aloe, MD Primary Care Physician: [name], PA Admit Date: 08/29/2019  Reason for Consultation/Follow-up: Establishing goals of care  Subjective:   Elizabeth [name] is resting in bed, her daughter is at the bedside today.  She participated some with OT earlier this morning. Complains of pain in back and generalized discomfort, discussed with daughter about patient's LUE swelling and lymphedema.       Length of Stay: 15  Current Medications: Scheduled Meds:  . amLODipine  5 mg Oral Daily  . Chlorhexidine Gluconate Cloth  6 each Topical Daily  . dexamethasone  4 mg Oral Q8H  . gabapentin  300 mg Oral BID  . gabapentin  300 mg Oral Once  . levothyroxine  25 mcg Oral QAC breakfast  . metoCLOPramide (REGLAN) injection  5 mg Intravenous Q6H  . oxyCODONE  10 mg Oral Q12H  . polyethylene glycol  17 g Oral BID  . prednisoLONE acetate  1 drop Both Eyes Daily  . senna-docusate  1 tablet Oral BID    Continuous Infusions:   PRN Meds: bisacodyl, HYDROmorphone (DILAUDID) injection, lip balm, LORazepam, ondansetron **OR** ondansetron (ZOFRAN) IV, oxyCODONE-acetaminophen **AND** oxyCODONE, sodium chloride flush  Physical Exam         Awake alert Resting in bed Complains of back and generalized pain  LUE with edema   Vital Signs: BP (!) 134/91 (BP Location: Right Arm)   Pulse 86   Temp 98.2 F (36.8 C) (Oral)   Resp 14   Ht 5\' 3"  (1.6 m)   Wt 43.5 kg   SpO2 100%   BMI 17.01 kg/m  SpO2: SpO2: 100 % O2 Device: O2 Device: Nasal Cannula O2 Flow Rate: O2 Flow Rate (L/min): 2 L/min  Intake/output summary:   Intake/Output Summary  (Last 24 hours) at 09/13/2019 1106 Last data filed at 09/13/2019 1027 Gross per 24 hour  Intake 185 ml  Output 0 ml  Net 185 ml   LBM: Last BM Date: (pta; modified) Baseline Weight: Weight: 43.5 kg Most recent weight: Weight: 43.5 kg Flowsheet Rows     Most Recent Value  Intake Tab  Referral Department  Hospitalist  Unit at  Time of Referral  Med/Surg Unit  Palliative Care Primary Diagnosis  Cancer  Date Notified  09/01/19  Palliative Care Type  New Palliative care  Reason for referral  Clarify Goals of Care  Date of Admission  08/29/19  Date first seen by Palliative Care  09/03/19  # of days Palliative referral response time  2 Day(s)  # of days IP prior to Palliative referral  3  Clinical Assessment  Psychosocial & Spiritual Assessment  Palliative Care Outcomes      Patient Active Problem List   Diagnosis Date Noted  . Palliative care by specialist   . Pleural effusion   . Vasogenic brain edema ([place])   . Brain metastasis ([place]) 08/29/2019  . Malignant neoplasm of lower-outer quadrant of left breast of female, estrogen receptor positive ([place]) 10/21/2018  . Goals of care, counseling/discussion 10/21/2018  . Bone metastasis ([place]) 12/22/2017  . Primary osteoarthritis of left hip 05/26/2017  . Osteoarthritis of left hip 05/23/2017  . Breast cancer of lower-outer quadrant of left female breast ([place]) 08/06/2012    Palliative Care Assessment & Plan   Patient Profile: 64 year old female with metastatic breast cancer with newly discovered brain mets (currently getting radiation therapy) with continued functional decline.  She is deemed not a candidate for further systemic therapy.  Recommendations/Plan: -Pain:  Continue oxycodone p.o. or Dilaudid IV as needed for breakthrough pain. Now on scheduled low dose opioids as well. Continue current dose.  -  Continue radiation treatments. One more dose tomorrow.  continue bowel regimen.  Discussed with daughter at bedside. SNF rehab  with palliative and med onc follow up in the outpatient setting is recommended.    Code Status:    Code Status Orders  (From admission, onward)         Start     Ordered   09/03/19 1552  Do not attempt resuscitation (DNR)  Continuous    Question Answer Comment  In the event of cardiac or respiratory ARREST Do not call a "code blue"   In the event of cardiac or respiratory ARREST Do not perform Intubation, CPR, defibrillation or ACLS   In the event of cardiac or respiratory ARREST Use medication by any route, position, wound care, and other measures to relive pain and suffering. May use oxygen, suction and manual treatment of airway obstruction as needed for comfort.      09/03/19 1551        Code Status History    Date Active Date Inactive Code Status Order ID Comments User Context   08/29/2019 2242 09/03/2019 1551 Full Code [id number]  Rise Patience, MD ED   05/26/2017 1021 05/28/2017 1500 Full Code [id number]  Leighton Parody, PA-C Inpatient   Advance Care Planning Activity    Advance Directive Documentation     Most Recent Value  Type of Advance Directive  Healthcare Power of Attorney  Pre-existing out of facility DNR order (yellow form or pink MOST form)  --  "MOST" Form in Place?  --       Prognosis:   Guarded  Discharge Planning:  SNF rehab with palliative on discharge.   Care plan was discussed with patient and daughter. Also checked in with [facility] MD.   Thank you for allowing the Palliative Medicine Team to assist in the care of this patient.   Time In: 10 Time Out: 10.25 Total Time 25 Prolonged Time Billed  No      Greater than 50%  of this  time was spent counseling and coordinating care related to the above assessment and plan.  Loistine Chance, MD  Please contact Palliative Medicine Team phone at [phone] for questions and concerns.

## 2019-09-13 NOTE — Progress Notes (Signed)
PROGRESS NOTE    Elizabeth [name]  [id number] DOB: 1955-08-30 DOA: 08/29/2019 PCP: [name], PA   Brief Narrative: Elizabeth [name] is a 64 y.o. female with a past medical history significant for but not limited to metastatic left sided breast cancer, hypothyroidism, hypercholesterolemia, history of migraines, vertigo. Patient presented secondary to dizziness and frequent falls found to have evidence of metastatic brain lesions with associated vasogenic edema and mass effect. Patient started on IV steroids and radiation therapy with some relative improvement in symptoms.   Assessment & Plan:   Principal Problem:   Brain metastasis ([place]) Active Problems:   Malignant neoplasm of lower-outer quadrant of left breast of female, estrogen receptor positive (HCC)   Pleural effusion   Vasogenic brain edema ([place])   Palliative care by specialist   Metastatic brain lesions Dizziness Vasogenic edema with associated mass effect seen on MRI. Patient started on Decadron IV and radiation therapy.  -Radiation therapy per radiation oncology; plan for 10 treatments -Continue Decadron 4 mg PO -Continue oxycodone prn -Continue Oxycontin 10 mg BID (discussed with palliative care medicine) to more consistently control pain -Senokot-S and Miralax for bowel regimen (no signs/symptoms of obstruction); decrease regimen once having bowel movements  Hypokalemia Resolved with repletion.  Hypothyroidism TSH of 1.054 -Continue synthroid  Right pleural effusion Patient underwent thoracentesis on 5/17 yielding 700 mL of yellow fluid which was only sent for pathology; pathology significant for inflammation with no mention of malignant cells. Currently stable on oxygen. No respiratory distress.  Leukocytosis No obvious evidence of infection however, patient does voice symptoms of dysuria which have resolved without antibiotics. Complicated by steroid use. Unlikely leukocytosis is secondary to possible UTI.  Urinalysis unremarkable. Urine culture with multiple species. Stable.  Dysuria Per patient. Some hesitancy noted by nursing. Urinalysis appears benign. Resolved symptoms.  Lymphedema Chronic issue. Upper extremity venous duplex from 3/5 negative for DVT at that time. -OT consult  Anemia of chronic disease Stable  Thrombocytosis In setting of cancer/inflammation. Stable.  Hyponatremia Mild. Possibly secondary to hypotonic IV fluids. Resolved.  Metastatic breast cancer Patient with metastasis as mentioned above. Previously on chemotherapy, however, oncology now recommending hospice.  Pre-diabetes Hemoglobin A1C of 6.3%. Recommend diet control.  Elevated blood pressure -Amlodipine 5 mg daily; increase as needed  Somnolence Possibly secondary to medication but concern is this could be related to metastatic brain lesions. Earlier in course, this may have been the case. Appears to have improved with radiation therapy but is somnolent in setting of Ativan. Since adjusting Ativan dosing, patient has remained alert. Resolved.   DVT prophylaxis: SCDs Code Status:   Code Status: DNR Family Communication: Daughter at bedside Disposition Plan: Discharge to SNF pending completion of radiation therapy as patient is currently unlikely to be able to transport easily from SNF to radiation as an outpatient.  Consultants:   Radiation oncology  Medical oncology  Palliative care  Interventional radiology  Procedures:   Radiation therapy  Antimicrobials:  Ceftriaxone    Subjective: Some pain from left arm swelling.  Objective: Vitals:   09/12/19 0500 09/12/19 1448 09/12/19 2123 09/13/19 0555  BP: 124/88 113/83 (!) 138/95 (!) 134/91  Pulse: 86 (!) 108 97 86  Resp: 17 18 16 14   Temp: (!) 97.5 F (36.4 C) 97.9 F (36.6 C) 98.3 F (36.8 C) 98.2 F (36.8 C)  TempSrc: Oral Oral Oral Oral  SpO2: 96% 97% 97% 100%  Weight:      Height:  Intake/Output Summary (Last 24  hours) at 09/13/2019 1311 Last data filed at 09/13/2019 1027 Gross per 24 hour  Intake 185 ml  Output 0 ml  Net 185 ml   Filed Weights   08/29/19 1459  Weight: 43.5 kg    Examination:  General exam: Appears calm and comfortable Respiratory system: Diminished on auscultation. Respiratory effort normal. Cardiovascular system: S1 & S2 heard, RRR. No murmurs, rubs, gallops or clicks. Gastrointestinal system: Abdomen is nondistended, soft and nontender. No organomegaly or masses felt. Normal bowel sounds heard. Central nervous system: Alert and oriented. No focal neurological deficits. Musculoskeletal: Upper left and BLE edema; pitting. Skin: No cyanosis. No rashes Psychiatry: Judgement and insight appear normal. Flat affect    Data Reviewed: I have personally reviewed following labs and imaging studies  Last CBC Lab Results  Component Value Date   WBC 11.7 (H) 09/11/2019   RBC 3.57 (L) 09/11/2019   HGB 11.4 (L) 09/11/2019   HCT 34.3 (L) 09/11/2019   MCV 96.1 09/11/2019   MCH 31.9 09/11/2019   PLT 494 (H) 09/11/2019   MCHC 33.2 09/11/2019   RDW 14.6 09/11/2019   LYMPHSABS 0.3 (L) 09/08/2019   MONOABS 0.7 09/08/2019   EOSABS 0.0 09/08/2019   BASOSABS 0.0 99991111     Last metabolic panel Lab Results  Component Value Date   NA 135 09/11/2019   K 3.5 09/11/2019   CL 101 09/11/2019   CO2 26 09/11/2019   BUN 15 09/11/2019   CREATININE 0.58 09/11/2019   GLUCOSE 122 (H) 09/11/2019   GFRNONAA >60 09/11/2019   GFRAA >60 09/11/2019   CALCIUM 8.4 (L) 09/11/2019   PHOS 2.5 09/08/2019   PROT 5.4 (L) 09/08/2019   ALBUMIN 2.9 (L) 09/08/2019   BILITOT 0.5 09/08/2019   ALKPHOS 76 09/08/2019   AST 33 09/08/2019   ALT 22 09/08/2019   ANIONGAP 8 09/11/2019    CBG (last 3)  No results for input(s): GLUCAP in the last 72 hours.   GFR: Estimated Creatinine Clearance: 48.1 mL/min (by C-G formula based on SCr of 0.58 mg/dL).  Coagulation Profile: No results for input(s):  INR, PROTIME in the last 168 hours.  Recent Results (from the past 240 hour(s))  Culture, Urine     Status: Abnormal   Collection Time: 09/08/19  3:04 PM   Specimen: Urine, Clean Catch  Result Value Ref Range Status   Specimen Description   Final    URINE, CLEAN CATCH Performed at [facility], [name] [address], [place], [place] [address]    Special Requests   Final    NONE Performed at [facility], [place] [address] [place], [place] [address]    Culture MULTIPLE SPECIES PRESENT, SUGGEST RECOLLECTION (A)  Final   Report Status 09/10/2019 FINAL  Final        Radiology Studies: No results found.      Scheduled Meds: . amLODipine  5 mg Oral Daily  . Chlorhexidine Gluconate Cloth  6 each Topical Daily  . dexamethasone  4 mg Oral Q8H  . gabapentin  300 mg Oral BID  . gabapentin  300 mg Oral Once  . levothyroxine  25 mcg Oral QAC breakfast  . metoCLOPramide (REGLAN) injection  5 mg Intravenous Q6H  . oxyCODONE  10 mg Oral Q12H  . polyethylene glycol  17 g Oral BID  . prednisoLONE acetate  1 drop Both Eyes Daily  . senna-docusate  1 tablet Oral BID   Continuous Infusions:  LOS: 15 days     Cordelia Poche, MD [facility] 09/13/2019, 1:11 PM  If 7PM-7AM, please contact night-coverage [email or web address]

## 2019-09-13 NOTE — Progress Notes (Signed)
Occupational Therapy Treatment Patient Details Name: Elizabeth [name] MRN: [id number] DOB: 22-Oct-1955 Today's Date: 09/13/2019    History of present illness 64 year old thin chronically ill-appearing Caucasian female with a past medical history significant for but not limited to metastatic left sided breast cancer, hypothyroidism, hypercholesterolemia, history of migraines, vertigo as well as other comorbidities who presented with a chief complaint of dizziness for the last 3 to 4 days with frequent falls.  She denied losing any consciousness however she presented to the ED for these chief complaint and further work-up was done and she had a head CT which showed metastatic lesions and an MRI of the brain which showed multiple metastatic lesions mostly in the cerebellum with mass-effect.  Patient was started on IV Decadron and medical oncology was consulted.  Subsequently radiation oncology was notified and she also went thoracentesis for a right pleural effusion.  Radiation oncology evaluated and felt that she was a candidate for brain radiotherapy was subsequently proceeding with systemic therapy under the care of Dr. Ladona Mow when she has completed radiotherapy.  Radiation oncology recommending a course of 10 fractions with the possibility of their time salvage radiosurgery if needed to the largest lesion in the cerebellum.   OT comments  Patient agreeable to sink side grooming this morning. Patient requires increased time for all mobility with min to min/mod A for static and dynamic balance using rolling walker. Mod to max cues for sequencing side stepping and managing walker during transfers/ambulation. Patient's DTR expressing concern regarding L UE lymphedema and MD arrive at end of session to discuss with DTR and possibly bringing in wraps from home. Spoke with nurse regarding elevating limb if edema sleeve is too uncomfortable for patient.   Updated D/C recommendations as patient's family expressing  wanting to pursue SNF for rehab.   Follow Up Recommendations  SNF;Supervision/Assistance - 24 hour    Equipment Recommendations  3 in 1 bedside commode       Precautions / Restrictions Precautions Precautions: Fall Restrictions Weight Bearing Restrictions: No       Mobility Bed Mobility Overal bed mobility: Needs Assistance Bed Mobility: Supine to Sit;Sit to Supine     Supine to sit: Min assist;HOB elevated Sit to supine: Min assist   General bed mobility comments: pt requires min A to elevate trunk to sit upright, increased time.   Transfers Overall transfer level: Needs assistance Equipment used: Rolling walker (2 wheeled) Transfers: Sit to/from Stand Sit to Stand: Min assist         General transfer comment: cues for safe hand placement and min A to power up     Balance Overall balance assessment: Needs assistance Sitting-balance support: No upper extremity supported;Feet supported Sitting balance-Leahy Scale: Fair     Standing balance support: Bilateral upper extremity supported;During functional activity Standing balance-Leahy Scale: Poor Standing balance comment: heavily reliant on external support for standing balance and min to min/mod A                           ADL either performed or assessed with clinical judgement   ADL Overall ADL's : Needs assistance/impaired     Grooming: Oral care;Minimal assistance;Standing Grooming Details (indicate cue type and reason): poor balance requiring min A to safely maintain static + dynamic standing during grooming/hygiene. assist with set up                 Toilet Transfer: Minimal assistance;Moderate assistance;Cueing for safety;Cueing for sequencing;Ambulation;RW  Toilet Transfer Details (indicate cue type and reason): simulated with transfer to chair, then bed. min/mod A at times for balance, mod to max cues for sequencing side stepping and advancing walker.          Functional mobility  during ADLs: Minimal assistance;Moderate assistance;Rolling walker;Cueing for safety;Cueing for sequencing General ADL Comments: patient requires increased time for all activities. Patient's DTR expressing concern regarding lymphedema in L UE, MD arrive at end of session and discussing possible wrapping to manage.                Cognition Arousal/Alertness: Awake/alert Behavior During Therapy: Flat affect Overall Cognitive Status: Impaired/Different from baseline Area of Impairment: Problem solving;Safety/judgement;Following commands                       Following Commands: Follows one step commands with increased time Safety/Judgement: Decreased awareness of safety   Problem Solving: Slow processing;Requires verbal cues                     Pertinent Vitals/ Pain       Pain Assessment: Faces Faces Pain Scale: Hurts even more Pain Location: generalized pain Pain Descriptors / Indicators: Discomfort Pain Intervention(s): RN gave pain meds during session         Frequency  Min 2X/week        Progress Toward Goals  OT Goals(current goals can now be found in the care plan section)  Progress towards OT goals: Progressing toward goals  Acute Rehab OT Goals Patient Stated Goal: to get stronger OT Goal Formulation: With patient Time For Goal Achievement: 09/19/19 Potential to Achieve Goals: Fair ADL Goals Pt Will Perform Grooming: with set-up;bed level Pt Will Perform Upper Body Dressing: sitting;with set-up Pt Will Perform Lower Body Dressing: with mod assist;sit to/from stand;sitting/lateral leans;with set-up Pt Will Transfer to Toilet: with min assist;stand pivot transfer;bedside commode Pt Will Perform Toileting - Clothing Manipulation and hygiene: with min assist;sit to/from stand Pt/caregiver will Perform Home Exercise Program: Increased strength;Both right and left upper extremity;With theraband  Plan Discharge plan needs to be updated        AM-PAC OT "6 Clicks" Daily Activity     Outcome Measure   Help from another person eating meals?: A Lot Help from another person taking care of personal grooming?: A Little Help from another person toileting, which includes using toliet, bedpan, or urinal?: A Lot Help from another person bathing (including washing, rinsing, drying)?: A Lot Help from another person to put on and taking off regular upper body clothing?: A Lot Help from another person to put on and taking off regular lower body clothing?: Total 6 Click Score: 12    End of Session Equipment Utilized During Treatment: Rolling walker  OT Visit Diagnosis: Unsteadiness on feet (R26.81);Muscle weakness (generalized) (M62.81);Adult, failure to thrive (R62.7)   Activity Tolerance Patient limited by fatigue   Patient Left in bed;with call bell/phone within reach;with family/visitor present;with bed alarm set   Nurse Communication Other (comment)(elevating L Ue)        [id number] OT Time Calculation (min): 23 min  Charges: OT General Charges $OT Visit: 1 Visit OT Treatments $Self Care/Home Management : 23-37 mins  Delbert Phenix OT Pager: [place] 09/13/2019, 12:05 PM

## 2019-09-13 NOTE — TOC Progression Note (Signed)
Transition of Care [facility]) - Progression Note    Patient Details  Name: Elizabeth [name] MRN: [id number] Date of Birth: 10/23/1955  Transition of Care [facility]) CM/SW [place], [place] Phone Number: 09/13/2019, 12:07 PM  Clinical Narrative:  Met with patient, daughter at bedside.  Patient confirmed that she is interested in going to SNF, that she wants to try rehab.  I talked to them about [facility] since that is where husband was hoping she could get in.  Seth Bake indicated there were several hurdles questions with this case.  First, they are not taking patients that are not vaccinated.  They are also concerned that this could turn into LTC with out a payor source if patient is unable to do therapy.  I talked to patient and daughter about this.  I explained that insurance would not pay if she was not getting rehab, and they agreed that she would want to come home if unable to rehab. I explained that in home help would be out of pocket expense as insurance does not pay, but that they would pay for Home health services.  Patient confirmed that she did not get COVID vaccination.  I gave them the names of the two facilities that extended bed offers, and told them it was my understanding that d/c would happen tomorrow. TOC will continue to follow during the course of hospitalization.     Expected Discharge Plan: Skilled Nursing Facility Barriers to Discharge: SNF Pending bed offer  Expected Discharge Plan and Services Expected Discharge Plan: [place]   Discharge Planning Services: CM Consult Post Acute Care Choice: [place] Living arrangements for the past 2 months: Single Family Home                                       Social Determinants of Health (SDOH) Interventions    Readmission Risk Interventions No flowsheet data found.

## 2019-09-13 NOTE — Progress Notes (Signed)
Physical Therapy Treatment Patient Details Name: Elizabeth [name] MRN: [id number] DOB: 11/04/1955 Today's Date: 09/13/2019    History of Present Illness 64 year old thin chronically ill-appearing Caucasian female with a past medical history significant for but not limited to metastatic left sided breast cancer, hypothyroidism, hypercholesterolemia, history of migraines, vertigo as well as other comorbidities who presented with a chief complaint of dizziness for the last 3 to 4 days with frequent falls.  She denied losing any consciousness however she presented to the ED for these chief complaint and further work-up was done and she had a head CT which showed metastatic lesions and an MRI of the brain which showed multiple metastatic lesions mostly in the cerebellum with mass-effect.  Patient was started on IV Decadron and medical oncology was consulted.  Subsequently radiation oncology was notified and she also went thoracentesis for a right pleural effusion.  Radiation oncology evaluated and felt that she was a candidate for brain radiotherapy was subsequently proceeding with systemic therapy under the care of Dr. Ladona Mow when she has completed radiotherapy.  Radiation oncology recommending a course of 10 fractions with the possibility of their time salvage radiosurgery if needed to the largest lesion in the cerebellum.    PT Comments    Pt up in recliner on arrival.  Pt agreeable to ambulated however only tolerated short distance and moves very slowly.  Also elevated L UE due to edema upon return to recliner.  Spouse reports plan is d/c to SNF.   Follow Up Recommendations  SNF;Supervision/Assistance - 24 hour     Equipment Recommendations  Rolling walker with 5" wheels    Recommendations for Other Services       Precautions / Restrictions Precautions Precautions: Fall Precaution Comments: L UE edema (?lymphedema) Restrictions Weight Bearing Restrictions: No    Mobility  Bed  Mobility Overal bed mobility: Needs Assistance Bed Mobility: Supine to Sit;Sit to Supine     Supine to sit: Min assist;HOB elevated Sit to supine: Min assist   General bed mobility comments: pt up in recliner  Transfers Overall transfer level: Needs assistance Equipment used: Rolling walker (2 wheeled) Transfers: Sit to/from Stand Sit to Stand: Min assist         General transfer comment: verbal cues for hand placement, assist to rise and steady  Ambulation/Gait Ambulation/Gait assistance: Min guard Gait Distance (Feet): 14 Feet Assistive device: Rolling walker (2 wheeled) Gait Pattern/deviations: Step-to pattern;Decreased stride length;Narrow base of support     General Gait Details: required increased time, questioned if pt could move a little faster and she stated, "let's not push it"  distance to tolerance   Stairs             Wheelchair Mobility    Modified Rankin (Stroke Patients Only)       Balance Overall balance assessment: Needs assistance Sitting-balance support: No upper extremity supported;Feet supported Sitting balance-Leahy Scale: Fair     Standing balance support: Bilateral upper extremity supported;During functional activity Standing balance-Leahy Scale: Poor Standing balance comment: heavily reliant on external support for standing balance and min to min/mod A                            Cognition Arousal/Alertness: Awake/alert Behavior During Therapy: Flat affect Overall Cognitive Status: Impaired/Different from baseline Area of Impairment: Problem solving;Safety/judgement;Following commands  Following Commands: Follows one step commands with increased time Safety/Judgement: Decreased awareness of safety   Problem Solving: Slow processing;Requires verbal cues        Exercises      General Comments        Pertinent Vitals/Pain Pain Assessment: No/denies pain Faces Pain Scale:  Hurts even more Pain Location: generalized pain Pain Descriptors / Indicators: Discomfort Pain Intervention(s): Premedicated before session;Repositioned    Home Living                      Prior Function            PT Goals (current goals can now be found in the care plan section) Acute Rehab PT Goals Patient Stated Goal: to get stronger Progress towards PT goals: Progressing toward goals    Frequency    Min 2X/week      PT Plan Current plan remains appropriate    Co-evaluation              AM-PAC PT "6 Clicks" Mobility   Outcome Measure  Help needed turning from your back to your side while in a flat bed without using bedrails?: A Little Help needed moving from lying on your back to sitting on the side of a flat bed without using bedrails?: A Little Help needed moving to and from a bed to a chair (including a wheelchair)?: A Little Help needed standing up from a chair using your arms (e.g., wheelchair or bedside chair)?: A Little Help needed to walk in hospital room?: A Lot Help needed climbing 3-5 steps with a railing? : Total 6 Click Score: 15    End of Session Equipment Utilized During Treatment: Gait belt Activity Tolerance: Patient tolerated treatment well Patient left: in chair;with call bell/phone within reach;with chair alarm set;with family/visitor present   PT Visit Diagnosis: Other abnormalities of gait and mobility (R26.89);Unsteadiness on feet (R26.81)     Time: 1400-1416 PT Time Calculation (min) (ACUTE ONLY): 16 min  Charges:  $Gait Training: 8-22 mins                     Arlyce Dice, DPT Acute Rehabilitation Services Office: [phone]  York Ram E 09/13/2019, 2:25 PM

## 2019-09-13 NOTE — Consult Note (Signed)
WOC consulted for upper extremity lymphedema.  OT referral made; this is the discipline that is trained and certified to treat lymphedema.   Notified MD of same.    Re consult if needed, will not follow at this time. Thanks  Chananya Canizalez [employer], RN,CWOCN, CNS, [place] [phone])

## 2019-09-14 ENCOUNTER — Inpatient Hospital Stay (HOSPITAL_COMMUNITY): Payer: Medicare Other

## 2019-09-14 ENCOUNTER — Encounter: Payer: Self-pay | Admitting: Radiation Oncology

## 2019-09-14 ENCOUNTER — Ambulatory Visit
Admission: RE | Admit: 2019-09-14 | Discharge: 2019-09-14 | Disposition: A | Discharge status: Home / Self Care | Payer: Medicare Other | Source: Ambulatory Visit | Attending: Radiation Oncology | Admitting: Radiation Oncology

## 2019-09-14 DIAGNOSIS — I890 Lymphedema, not elsewhere classified: Secondary | ICD-10-CM

## 2019-09-14 LAB — SARS CORONAVIRUS 2 BY RT PCR (HOSPITAL ORDER, PERFORMED IN ~~LOC~~ HOSPITAL LAB): SARS Coronavirus 2: NEGATIVE

## 2019-09-14 MED ORDER — DEXAMETHASONE 4 MG PO TABS: 4.0000 mg | ORAL_TABLET | Freq: Three times a day (TID) | ORAL | Status: DC

## 2019-09-14 MED ORDER — POLYETHYLENE GLYCOL 3350 17 G PO PACK: 17.0000 g | PACK | Freq: Two times a day (BID) | ORAL | 0 refills | Status: AC

## 2019-09-14 MED ORDER — BISACODYL 10 MG RE SUPP: 10.0000 mg | Freq: Every day | RECTAL | 0 refills | Status: AC | PRN

## 2019-09-14 MED ORDER — OXYCODONE-ACETAMINOPHEN 10-325 MG PO TABS: 1.0000 | ORAL_TABLET | Freq: Three times a day (TID) | ORAL | 0 refills | Status: AC | PRN

## 2019-09-14 MED ORDER — SENNOSIDES-DOCUSATE SODIUM 8.6-50 MG PO TABS: 2.0000 | ORAL_TABLET | Freq: Two times a day (BID) | ORAL | Status: AC

## 2019-09-14 MED ORDER — AMLODIPINE BESYLATE 5 MG PO TABS: 5.0000 mg | ORAL_TABLET | Freq: Every day | ORAL | Status: AC

## 2019-09-14 MED ORDER — DEXAMETHASONE 4 MG PO TABS: 4.0000 mg | ORAL_TABLET | Freq: Two times a day (BID) | ORAL | Status: AC

## 2019-09-14 MED ORDER — OXYCODONE HCL ER 10 MG PO T12A: 10.0000 mg | EXTENDED_RELEASE_TABLET | Freq: Two times a day (BID) | ORAL | 0 refills | Status: AC

## 2019-09-14 NOTE — Discharge Summary (Addendum)
Physician Discharge Summary  Elizabeth [name] [id number] DOB: [birth year] DOA: 08/29/2019  PCP: [name], PA  Admit date: 08/29/2019 Discharge date: 09/14/2019  Admitted From: Home Disposition: SNF  Recommendations for Outpatient Follow-up:  1. Follow up with PCP in 1 week 2. Follow up with palliative care recommended 3. Lymphedema control 4. Please follow up on the following pending results: None  Home Health: SNF Equipment/Devices: None  Discharge Condition: Stable CODE STATUS: DNR Diet recommendation: Regular   Brief/Interim Summary:  Admission HPI written by Rise Patience, MD   Chief Complaint: Dizziness.  HPI: Elizabeth [name] is a 64 y.o. female with history of metastatic breast cancer and hypothyroidism has been experiencing dizziness over the last 3 to 4 days with frequent falls denies losing consciousness.  Patient states she has more weakness on the right side and staggers towards the right side.  Denies any incontinence of urine or bowel.  Has been a global headache more on the right ear side.  Denies any visual symptoms or difficulty speaking.  ED Course: In the ER CT head shows metastatic lesions and MRI brain was done with and without contrast which shows multiple metastatic lesion mostly concentrated in the cerebellum with mass-effect.  On-call oncologist was consulted requested patient be placed on Decadron IV and they will be seeing patient in consult and probably will need radiation oncology consult.  On exam patient is able to move all extremities.  Labs show hemoglobin around baseline of 11.3 platelets 418 potassium was 3.1.  I sensitive troponin was 7 EKG shows normal sinus rhythm.  Covid test was negative.   Hospital course:  Metastatic brain lesions Dizziness Vasogenic edema with associated mass effect seen on MRI. Patient started on Decadron IV and radiation therapy. Radiation oncology completed 10 radiation treatments. Functionally, patient  has improved. Patient managed on Decadron 4 mg TID and will discharge on Decadron 4 mg BID with radiation oncology to follow-up on further titration. Pain regimen adjusted and will discharge with Oxycontin 10 mg BID to better control medication. She will also need a bowel regimen and recommend Senokot-S and Miralax. Abdominal x-ray prior to discharge without evidence of bowel obstruction.  Hypokalemia Resolved with repletion.  Hypothyroidism TSH of 1.054. Continue synthroid  Right pleural effusion Patient underwent thoracentesis on 5/17 yielding 700 mL of yellow fluid which was only sent for pathology; pathology significant for inflammation with no mention of malignant cells. Currently stable on oxygen. No respiratory distress.  Leukocytosis No obvious evidence of infection however, patient does voice symptoms of dysuria which have resolved without antibiotics. Complicated by steroid use. Unlikely leukocytosis is secondary to possible UTI. Urinalysis unremarkable. Urine culture with multiple species. Stable.  Dysuria Per patient. Some hesitancy noted by nursing. Urinalysis appears benign. Resolved symptoms.  Lymphedema Chronic issue. Upper extremity venous duplex from 3/5 negative for DVT at that time. OT consulted. Recommend outpatient follow-up  Anemia of chronic disease Stable  Thrombocytosis In setting of cancer/inflammation. Stable.  Hyponatremia Mild. Possibly secondary to hypotonic IV fluids. Resolved.  Metastatic breast cancer Patient with metastasis as mentioned above. Previously on chemotherapy, however, oncology now recommending hospice.  Pre-diabetes Hemoglobin A1C of 6.3%. Recommend diet control.  Elevated blood pressure Amlodipine 5 mg daily; increase as needed.  Chronic respiratory failure with hypoxia Continue 2 L oxygen via [place]  Somnolence Possibly secondary to medication but concern is this could be related to metastatic brain lesions. Earlier  in course, this may have been the case. Appears to  have improved with radiation therapy but is somnolent in setting of Ativan. Since adjusting Ativan dosing, patient has remained alert. Resolved.  Discharge Diagnoses:  Principal Problem:   Brain metastasis ([place]) Active Problems:   Malignant neoplasm of lower-outer quadrant of left breast of female, estrogen receptor positive (HCC)   Pleural effusion   Vasogenic brain edema ([place])   Palliative care by specialist    Discharge Instructions   Allergies as of 09/14/2019      Reactions   Shellfish Allergy Anaphylaxis, Other (See Comments)   Allergist said she was "highly allergic to shellfish"   Sulfa Antibiotics Rash, Other (See Comments)   Looks like severe sunburn      Medication List    STOP taking these medications   traMADol 50 MG tablet Commonly known as: ULTRAM     TAKE these medications   ALPELISIB (300 MG DAILY DOSE) PO Take 1 capsule by mouth in the morning and at bedtime.   amLODipine 5 MG tablet Commonly known as: NORVASC Take 1 tablet (5 mg total) by mouth daily. Start taking on: September 15, 2019   Biotin 10000 MCG Tabs Take 10,000 mcg by mouth daily.   bisacodyl 10 MG suppository Commonly known as: DULCOLAX Place 1 suppository (10 mg total) rectally daily as needed for moderate constipation.   dexamethasone 4 MG tablet Commonly known as: DECADRON Take 1 tablet (4 mg total) by mouth 2 (two) times daily. What changed:   medication strength  how much to take  when to take this   gabapentin 300 MG capsule Commonly known as: NEURONTIN Take 1 capsule (300 mg total) by mouth 2 (two) times daily.   letrozole 2.5 MG tablet Commonly known as: FEMARA Take 1 tablet (2.5 mg total) by mouth daily.   levothyroxine 25 MCG tablet Commonly known as: SYNTHROID Take 25 mcg by mouth daily before breakfast.   lidocaine-prilocaine cream Commonly known as: EMLA Apply to affected area once   loperamide 2 MG  capsule Commonly known as: IMODIUM 1 to 2 PO QID prn diarrhea What changed:   how much to take  how to take this  when to take this  reasons to take this   magic mouthwash w/lidocaine Soln Take 5 mLs by mouth 3 (three) times daily as needed for mouth pain.   multivitamin tablet Take 1 tablet by mouth daily.   omeprazole 20 MG capsule Commonly known as: PRILOSEC Take 1 capsule (20 mg total) by mouth daily. What changed:   when to take this  reasons to take this   ondansetron 8 MG tablet Commonly known as: ZOFRAN Take 1 tablet (8 mg total) by mouth every 8 (eight) hours as needed for nausea.   oxyCODONE 10 mg 12 hr tablet Commonly known as: OXYCONTIN Take 1 tablet (10 mg total) by mouth every 12 (twelve) hours.   oxyCODONE-acetaminophen 10-325 MG tablet Commonly known as: PERCOCET Take 1 tablet by mouth every 8 (eight) hours as needed for pain.   polyethylene glycol 17 g packet Commonly known as: MIRALAX / GLYCOLAX Take 17 g by mouth 2 (two) times daily.   prednisoLONE acetate 1 % ophthalmic suspension Commonly known as: PRED FORTE Place 1 drop into both eyes daily.   prochlorperazine 10 MG tablet Commonly known as: COMPAZINE Take 1 tablet (10 mg total) by mouth every 6 (six) hours as needed (Nausea or vomiting).   senna-docusate 8.6-50 MG tablet Commonly known as: Senokot-S Take 2 tablets by mouth 2 (two) times daily.  simethicone 80 MG chewable tablet Commonly known as: Gas-X Chew 1 tablet (80 mg total) by mouth every 6 (six) hours as needed for flatulence.   VITAMIN D PO Take 5,000 Units by mouth daily.       Allergies  Allergen Reactions  . Shellfish Allergy Anaphylaxis and Other (See Comments)    Allergist said she was "highly allergic to shellfish"  . Sulfa Antibiotics Rash and Other (See Comments)    Looks like severe sunburn    Consultations:  Radiation oncology  Medical oncology  Palliative care  Interventional  radiology   Procedures/Studies: DG Chest 1 View  Result Date: 08/30/2019 CLINICAL DATA:  Patient status post right thoracentesis. EXAM: CHEST  1 VIEW COMPARISON:  Chest radiograph 06/18/2019. FINDINGS: Right anterior chest wall Port-A-Cath is present with tip projecting over the superior vena cava. Stable enlarged cardiac and mediastinal contours. Similar-appearing consolidation within the left mid upper lung. Small left pleural effusion. Minimal right basilar atelectasis. No right-sided pneumothorax identified. Skin fold projects over the right thorax. IMPRESSION: Persistent small left pleural effusion. Similar-appearing patchy opacities within the left lung. Electronically Signed   By: Lovey Newcomer M.D.   On: 08/30/2019 12:59   CT Head Wo Contrast  Result Date: 08/29/2019 CLINICAL DATA:  Headache, new or worsening, cancer history (Age 45-49y) History of breast cancer, metastatic to bone. EXAM: CT HEAD WITHOUT CONTRAST TECHNIQUE: Contiguous axial images were obtained from the base of the skull through the vertex without intravenous contrast. COMPARISON:  No prior head CT. FINDINGS: Brain: Ill-defined edema in the right posterior frontal anterior parietal lobe at the high convexity suspicious for underlying lesion, series 2, image 25. Possible rounded hyperdense lesion abutting the temporal horn of the right lateral ventricle measuring 8 mm. Small periventricular hyperattenuating focus in the left frontal lobe, series 2, image 19. Questionable edema involving the central cerebellum, right more prominent than left, series 2 images 9 through 11. Slight crowding of the basilar cisterns. No hemorrhage or extra-axial collection. Vascular: No hyperdense vessel. Skull: No evidence of blastic or destructive bone lesion. Sinuses/Orbits: Paranasal sinuses and mastoid air cells are clear. The visualized orbits are unremarkable. Other: None. IMPRESSION: 1. Findings suspicious for intracranial metastatic disease with  ill-defined edema in the right frontoparietal lobe at the high convexity as well as bilateral cerebellum. Possible hyperdense nodule in the right temporal lobe abutting the lateral ventricle and periventricular left frontal lobe. Recommend MRI without and with IV contrast for further evaluation. 2. There is slight crowding of the basilar cisterns. These results were called by telephone at the time of interpretation on 08/29/2019 at 6:40 pm to Dr [facility] Tyrone Nine , who verbally acknowledged these results. Electronically Signed   By: Keith Rake M.D.   On: 08/29/2019 18:40   CT Chest W Contrast  Result Date: 08/19/2019 CLINICAL DATA:  Metastatic breast cancer.  Restaging. EXAM: CT CHEST, ABDOMEN, AND PELVIS WITH CONTRAST TECHNIQUE: Multidetector CT imaging of the chest, abdomen and pelvis was performed following the standard protocol during bolus administration of intravenous contrast. CONTRAST:  5mL OMNIPAQUE IOHEXOL 300 MG/ML  SOLN COMPARISON:  04/30/2019 FINDINGS: CT CHEST FINDINGS Cardiovascular: Normal heart size. Small pericardial effusion is unchanged from the previous exam. Aortic atherosclerosis. Lad coronary artery atherosclerotic calcification. Mediastinum/Nodes: Normal appearance of the thyroid gland. The trachea appears patent and is midline. Normal appearance of the esophagus. Index pericardial lymph node has decreased in size from previous exam measuring 0.5 cm on today's study, image 49/2. On the previous exam this  measured 1.4 cm (long axis). Left axillary index lymph node measures 0.8 x 0.7 cm, image 17/2. Previously 1.4 x 1.0 cm. Low-density lymph node posterior to the right bronchus intermedius measures 1.1 cm, image 29/2. Previously 1.3 cm. Lungs/Pleura: Loculated left pleural effusion with pleural thickening and enhancement is unchanged when compared with the previous exam. Extensive architectural distortion and fibrosis with volume loss noted in the left lung. This is unchanged from the  previous exam. Paramediastinal consolidation and fibrosis within the right upper lobe is similar to previous exam. There is a new moderate right pleural effusion. Also new is near complete atelectasis of the right middle lobe, image 65/4. Musculoskeletal: Multifocal sclerotic bone metastases are identified. T4 and T11 compression fractures are unchanged. CT ABDOMEN PELVIS FINDINGS Hepatobiliary: There is a low-attenuation lesion within posterior right hepatic lobe measuring 1.1 cm, image 57/2. This is a new finding when compared with the previous exam. The there are several additional low-density lesions which are less than 1 cm and appear unchanged from the prior exam. Gallbladder unremarkable. No biliary dilatation. Pancreas: Unremarkable. No pancreatic ductal dilatation or surrounding inflammatory changes. Spleen: Normal in size without focal abnormality. Adrenals/Urinary Tract: Bilateral areas of low attenuation involving both upper poles of the kidneys noted. No mass or hydronephrosis identified bilaterally. Stomach/Bowel: Stomach is within normal limits. Appendix appears normal. No evidence of bowel wall thickening, distention, or inflammatory changes. Vascular/Lymphatic: Aortic atherosclerosis. There is a infiltrative soft tissue mass within the left periaortic region at the level of the superior mesenteric artery. This measures 3.4 x 2.0 by 2.5 cm, image 73/5 and image 55/2. This partially encases the celiac trunk and superior mesenteric artery as well as the left adrenal gland. Present on the previous exam this measured 3.2 x 1.6 by 2.8 cm. Reproductive: Uterus and bilateral adnexa are unremarkable. Other: There is trace free fluid identified within the abdomen and pelvis. Musculoskeletal: Previous left hip arthroplasty. Multifocal sclerotic bone metastases are again noted and appears similar to the previous exam IMPRESSION: 1. New low-attenuation lesion within right lobe of liver, suspicious for metastatic  disease. There is also a new moderate right pleural effusion. Consider further evaluation with diagnostic thoracentesis to evaluate for tumor involvement of the right hemithorax. 2. Similar appearance of loculated left-sided pleural effusion with pleural thickening and enhancement compatible with pleural spread of disease. 3. Interval decrease in size of enhancing lymph nodes within the mediastinum and left axilla. 4. Similar size of left retroperitoneal mass which involves the celiac artery, SMA and left adrenal gland. 5. Unchanged small pericardial effusion 6. Unchanged appearance of multifocal sclerotic bone metastases with pathologic fractures involving T4 and T11 vertebra. 7. Geographic distribution of low attenuation within the upper poles of both kidneys is favored to represent sequelae of external beam radiation. 8. Coronary artery calcifications 9.  Aortic Atherosclerosis (ICD10-I70.0). Electronically Signed   By: Kerby Moors M.D.   On: 08/19/2019 10:50   MR Brain W and Wo Contrast  Result Date: 08/29/2019 CLINICAL DATA:  Headache with vertigo. History of breast carcinoma. EXAM: MRI HEAD WITHOUT AND WITH CONTRAST TECHNIQUE: Multiplanar, multiecho pulse sequences of the brain and surrounding structures were obtained without and with intravenous contrast. CONTRAST:  15mL GADAVIST GADOBUTROL 1 MMOL/ML IV SOLN COMPARISON:  Head CT 08/29/2019 FINDINGS: Brain: Areas of vasogenic edema within both cerebellar hemispheres, the right parietal lobe and the brainstem. No abnormal diffusion restriction. There are numerous (approximately 50) contrast-enhancing lesions throughout the brain and cerebellum. Within the cerebellum, there are at  least 25 lesions, the largest of which measures 2.5 x 2.0 cm. There are 3 small lesions in the midbrain. Right temporal lobe lesion measures 9 mm. Right basal ganglia lesion measures 7 mm. Superior right parietal lesion measures 11 mm. No chronic microhemorrhage. Mass effect in  the cerebellum compresses the fourth ventricle and distal cerebral aqueduct. Vascular: Normal flow voids. Skull and upper cervical spine: Normal marrow signal. Sinuses/Orbits: Negative. Other: None. IMPRESSION: 1. Numerous (approximately 50) intraparenchymal brain metastases within both hemispheres and most greatly concentrated in the cerebellum. 2. Vasogenic edema is greatest in the cerebellum or there is mass effect on the fourth ventricle and distal cerebral aqueduct. Electronically Signed   By: Ulyses Jarred M.D.   On: 08/29/2019 19:54   CT Abdomen Pelvis W Contrast  Result Date: 08/19/2019 CLINICAL DATA:  Metastatic breast cancer.  Restaging. EXAM: CT CHEST, ABDOMEN, AND PELVIS WITH CONTRAST TECHNIQUE: Multidetector CT imaging of the chest, abdomen and pelvis was performed following the standard protocol during bolus administration of intravenous contrast. CONTRAST:  32mL OMNIPAQUE IOHEXOL 300 MG/ML  SOLN COMPARISON:  04/30/2019 FINDINGS: CT CHEST FINDINGS Cardiovascular: Normal heart size. Small pericardial effusion is unchanged from the previous exam. Aortic atherosclerosis. Lad coronary artery atherosclerotic calcification. Mediastinum/Nodes: Normal appearance of the thyroid gland. The trachea appears patent and is midline. Normal appearance of the esophagus. Index pericardial lymph node has decreased in size from previous exam measuring 0.5 cm on today's study, image 49/2. On the previous exam this measured 1.4 cm (long axis). Left axillary index lymph node measures 0.8 x 0.7 cm, image 17/2. Previously 1.4 x 1.0 cm. Low-density lymph node posterior to the right bronchus intermedius measures 1.1 cm, image 29/2. Previously 1.3 cm. Lungs/Pleura: Loculated left pleural effusion with pleural thickening and enhancement is unchanged when compared with the previous exam. Extensive architectural distortion and fibrosis with volume loss noted in the left lung. This is unchanged from the previous exam.  Paramediastinal consolidation and fibrosis within the right upper lobe is similar to previous exam. There is a new moderate right pleural effusion. Also new is near complete atelectasis of the right middle lobe, image 65/4. Musculoskeletal: Multifocal sclerotic bone metastases are identified. T4 and T11 compression fractures are unchanged. CT ABDOMEN PELVIS FINDINGS Hepatobiliary: There is a low-attenuation lesion within posterior right hepatic lobe measuring 1.1 cm, image 57/2. This is a new finding when compared with the previous exam. The there are several additional low-density lesions which are less than 1 cm and appear unchanged from the prior exam. Gallbladder unremarkable. No biliary dilatation. Pancreas: Unremarkable. No pancreatic ductal dilatation or surrounding inflammatory changes. Spleen: Normal in size without focal abnormality. Adrenals/Urinary Tract: Bilateral areas of low attenuation involving both upper poles of the kidneys noted. No mass or hydronephrosis identified bilaterally. Stomach/Bowel: Stomach is within normal limits. Appendix appears normal. No evidence of bowel wall thickening, distention, or inflammatory changes. Vascular/Lymphatic: Aortic atherosclerosis. There is a infiltrative soft tissue mass within the left periaortic region at the level of the superior mesenteric artery. This measures 3.4 x 2.0 by 2.5 cm, image 73/5 and image 55/2. This partially encases the celiac trunk and superior mesenteric artery as well as the left adrenal gland. Present on the previous exam this measured 3.2 x 1.6 by 2.8 cm. Reproductive: Uterus and bilateral adnexa are unremarkable. Other: There is trace free fluid identified within the abdomen and pelvis. Musculoskeletal: Previous left hip arthroplasty. Multifocal sclerotic bone metastases are again noted and appears similar to the previous exam IMPRESSION:  1. New low-attenuation lesion within right lobe of liver, suspicious for metastatic disease. There  is also a new moderate right pleural effusion. Consider further evaluation with diagnostic thoracentesis to evaluate for tumor involvement of the right hemithorax. 2. Similar appearance of loculated left-sided pleural effusion with pleural thickening and enhancement compatible with pleural spread of disease. 3. Interval decrease in size of enhancing lymph nodes within the mediastinum and left axilla. 4. Similar size of left retroperitoneal mass which involves the celiac artery, SMA and left adrenal gland. 5. Unchanged small pericardial effusion 6. Unchanged appearance of multifocal sclerotic bone metastases with pathologic fractures involving T4 and T11 vertebra. 7. Geographic distribution of low attenuation within the upper poles of both kidneys is favored to represent sequelae of external beam radiation. 8. Coronary artery calcifications 9.  Aortic Atherosclerosis (ICD10-I70.0). Electronically Signed   By: Kerby Moors M.D.   On: 08/19/2019 10:50   DG CHEST PORT 1 VIEW  Result Date: 09/05/2019 CLINICAL DATA:  Shortness of breath EXAM: PORTABLE CHEST 1 VIEW COMPARISON:  Three days ago FINDINGS: Port with tip at the upper right atrium. No suspected cardiomegaly with left heart partially obscured. Hazy bilateral chest from pleural fluid. There is also volume loss and pulmonary opacity on the left more than right. There has been left-sided lung surgery. No visible pneumothorax. IMPRESSION: Post treatment chest with stable pleural effusions that obscure the left more than right lung. Electronically Signed   By: Monte Fantasia M.D.   On: 09/05/2019 07:34   DG CHEST PORT 1 VIEW  Result Date: 09/02/2019 CLINICAL DATA:  Shortness of breath. History of metastatic breast cancer. EXAM: PORTABLE CHEST 1 VIEW COMPARISON:  08/30/2019 FINDINGS: Right IJ Port-A-Cath unchanged. Lungs are hypoinflated and continue to demonstrate opacification over the mid to lower left lung. Hazy opacification over the right lung  suggesting layering effusion with atelectasis. Cardiomediastinal silhouette and remainder of the exam is unchanged. IMPRESSION: Stable opacification over the left mid to lower lung likely effusion with associated compressive atelectasis. Suggestion of layering right pleural effusion/atelectasis. Electronically Signed   By: Marin Olp M.D.   On: 09/02/2019 08:04   DG Abd Portable 1V  Result Date: 09/14/2019 CLINICAL DATA:  Constipation. EXAM: PORTABLE ABDOMEN - 1 VIEW COMPARISON:  None. FINDINGS: The bowel gas pattern is normal. No radio-opaque calculi or other significant radiographic abnormality are seen. Levoconvex curvature is present in the lower lumbar spine. Left total hip replacement is again noted. IMPRESSION: No acute abnormality. Electronically Signed   By: San Morelle M.D.   On: 09/14/2019 11:54   US THORACENTESIS ASP PLEURAL SPACE W/IMG GUIDE  Result Date: 08/30/2019 INDICATION: Patient with history of metastatic breast cancer, dyspnea, right pleural effusion. Request received for diagnostic and therapeutic right thoracentesis. EXAM: ULTRASOUND GUIDED DIAGNOSTIC AND THERAPEUTIC RIGHT THORACENTESIS MEDICATIONS: None COMPLICATIONS: None immediate. PROCEDURE: An ultrasound guided thoracentesis was thoroughly discussed with the patient and questions answered. The benefits, risks, alternatives and complications were also discussed. The patient understands and wishes to proceed with the procedure. Written consent was obtained. Ultrasound was performed to localize and mark an adequate pocket of fluid in the right chest. The area was then prepped and draped in the normal sterile fashion. 1% Lidocaine was used for local anesthesia. Under ultrasound guidance a 6 Fr Safe-T-Centesis catheter was introduced. Thoracentesis was performed. The catheter was removed and a dressing applied. FINDINGS: A total of approximately 700 cc of yellow fluid was removed. Samples were sent to the laboratory as  requested by  the clinical team. IMPRESSION: Successful ultrasound guided diagnostic and therapeutic right thoracentesis yielding 700 cc of pleural fluid. Read by: Rowe Robert, PA-C Electronically Signed   By: Jacqulynn Cadet M.D.   On: 08/30/2019 13:11      Subjective: Pain is better controlled on low dose scheduled narcotic. Her left arm swelling is increasing and pain is worsening. No other concerns  Discharge Exam: Vitals:   09/14/19 0405 09/14/19 1043  BP: 121/81 (!) 141/82  Pulse: 88 91  Resp: 18 20  Temp: 97.6 F (36.4 C)   SpO2: 98% 100%   Vitals:   09/13/19 1427 09/13/19 2043 09/14/19 0405 09/14/19 1043  BP: 129/79 118/84 121/81 (!) 141/82  Pulse: 87 83 88 91  Resp: 16 17 18 20   Temp: 98.1 F (36.7 C) 97.7 F (36.5 C) 97.6 F (36.4 C)   TempSrc: Oral Oral Oral   SpO2: 98% 100% 98% 100%  Weight:      Height:        General: Pt is alert, awake, not in acute distress Cardiovascular: RRR, S1/S2 +, no rubs, no gallops Respiratory: CTA bilaterally, no wheezing, no rhonchi Abdominal: Soft, NT, ND, bowel sounds + Extremities: 2+ left upper extremity and BLE edema, no cyanosis    The results of significant diagnostics from this hospitalization (including imaging, microbiology, ancillary and laboratory) are listed below for reference.     Microbiology: Recent Results (from the past 240 hour(s))  Culture, Urine     Status: Abnormal   Collection Time: 09/08/19  3:04 PM   Specimen: Urine, Clean Catch  Result Value Ref Range Status   Specimen Description   Final    URINE, CLEAN CATCH Performed at [facility], [place] [address] [place], [place] [address]    Special Requests   Final    NONE Performed at [facility], [place] [address], [place], [place] [address]    Culture MULTIPLE SPECIES PRESENT, SUGGEST RECOLLECTION (A)  Final   Report Status 09/10/2019 FINAL  Final     Labs: BNP (last 3 results) No results for input(s):  BNP in the last 8760 hours. Basic Metabolic Panel: Recent Labs  Lab 09/08/19 0301 09/11/19 0529  NA 135 135  K 3.5 3.5  CL 102 101  CO2 26 26  GLUCOSE 128* 122*  BUN 12 15  CREATININE 0.59 0.58  CALCIUM 8.0* 8.4*  MG 2.2  --   PHOS 2.5  --    Liver Function Tests: Recent Labs  Lab 09/08/19 0301  AST 33  ALT 22  ALKPHOS 76  BILITOT 0.5  PROT 5.4*  ALBUMIN 2.9*   No results for input(s): LIPASE, AMYLASE in the last 168 hours. No results for input(s): AMMONIA in the last 168 hours. CBC: Recent Labs  Lab 09/08/19 0301 09/11/19 0529  WBC 10.9* 11.7*  NEUTROABS 9.8*  --   HGB 10.4* 11.4*  HCT 32.0* 34.3*  MCV 98.2 96.1  PLT 508* 494*   Cardiac Enzymes: No results for input(s): CKTOTAL, CKMB, CKMBINDEX, TROPONINI in the last 168 hours. BNP: Invalid input(s): POCBNP CBG: No results for input(s): GLUCAP in the last 168 hours. D-Dimer No results for input(s): DDIMER in the last 72 hours. Hgb A1c No results for input(s): HGBA1C in the last 72 hours. Lipid Profile No results for input(s): CHOL, HDL, LDLCALC, TRIG, CHOLHDL, LDLDIRECT in the last 72 hours. Thyroid function studies No results for input(s): TSH, T4TOTAL, T3FREE, THYROIDAB in the last 72 hours.  Invalid  input(s): FREET3 Anemia work up No results for input(s): VITAMINB12, FOLATE, FERRITIN, TIBC, IRON, RETICCTPCT in the last 72 hours. Urinalysis    Component Value Date/Time   COLORURINE STRAW (A) 09/08/2019 1504   APPEARANCEUR CLEAR 09/08/2019 1504   LABSPEC 1.005 09/08/2019 1504   PHURINE 5.0 09/08/2019 1504   GLUCOSEU NEGATIVE 09/08/2019 1504   HGBUR NEGATIVE 09/08/2019 1504   BILIRUBINUR NEGATIVE 09/08/2019 1504   KETONESUR NEGATIVE 09/08/2019 1504   PROTEINUR NEGATIVE 09/08/2019 1504   UROBILINOGEN 0.2 05/17/2009 1356   NITRITE NEGATIVE 09/08/2019 1504   LEUKOCYTESUR NEGATIVE 09/08/2019 1504   Sepsis Labs Invalid input(s): PROCALCITONIN,  WBC,  LACTICIDVEN Microbiology Recent Results  (from the past 240 hour(s))  Culture, Urine     Status: Abnormal   Collection Time: 09/08/19  3:04 PM   Specimen: Urine, Clean Catch  Result Value Ref Range Status   Specimen Description   Final    URINE, CLEAN CATCH Performed at [facility], [place] [address], [place], [place] [address]    Special Requests   Final    NONE Performed at [facility], [place] [address] [place], [place] [address]    Culture MULTIPLE SPECIES PRESENT, SUGGEST RECOLLECTION (A)  Final   Report Status 09/10/2019 FINAL  Final     Time coordinating discharge: 35 minutes  SIGNED:   Cordelia Poche, MD [facility] 09/14/2019, 12:32 PM

## 2019-09-14 NOTE — TOC Progression Note (Addendum)
Transition of Care [facility]) - Progression Note    Patient Details  Name: Elizabeth [name] MRN: [id number] Date of Birth: 1956/03/03  Transition of Care [facility]) CM/SW [place], [place] Phone Number: 09/14/2019, 10:44 AM  Clinical Narrative:   Spoke with husband to confirm choice of facilities.  He asked that I call [facility] as it is close by, since [facility] is not an option due to  requirement of COVID vaccination.  Olivia Mackie is reviewing for admission.  In the meantime, husband called to say he found a [facility] walk in site that can give patient vaccination today.  Called Seth Bake at [facility], who states she needs COVID test and COVID vaccine prior to admission.  Will work to get everything together so patient can d/c today.    Addendum: COVID test will not come back in time for patient to get to vaccination site and then to facility in time for admission. Dr notified.  Confirmed transfer plans with patient, family and facility for tomorrow.    Expected Discharge Plan: Skilled Nursing Facility Barriers to Discharge: SNF Pending bed offer  Expected Discharge Plan and Services Expected Discharge Plan: [place]   Discharge Planning Services: CM Consult Post Acute Care Choice: [place] Living arrangements for the past 2 months: Single Family Home                                       Social Determinants of Health (SDOH) Interventions    Readmission Risk Interventions No flowsheet data found.

## 2019-09-14 NOTE — Progress Notes (Signed)
Called PT / OT about left arm and possible compression wrap.

## 2019-09-14 NOTE — Progress Notes (Signed)
  PT Note 09/14/19 0001  Therapeutic Activites   Therapeutic Activities Other Therapeutic Activities  Other Therapeutic Activities To Lt UE: Pts lotion applied to arm, thick stockinette, Elastomull to all fingers, Artiflex x1 with 1/4" gray foam to anterior forearm and 1/2" at antecubital fossa, then 1-6 cm, 1-10 cm and 1-12 cm short stretch compression bandages from hand to near axilla  Exercises  Exercises Other Exercises  Other Exercises  Reviewed importance of remedial exercises with pt and husband: to perform open/close hand, wrist up/down, bend and straighten elbow and raise UE as able. Also instructed nurse in this and for them to remove her bandages if increased tingling or color changes in fingertips.    Pt tolerated the lymphedema compression bandaging well though was reporting some tingling in fingertips so instructed nurse and husband to have pt perform remedial exercises. If symptoms persist remove bandages.  Pt was reporting 10/10 pain in general, but also in Lt UE. Nursing came in and pt was issued pain medication.    In time: 1800 End time: [place], PTA 09/14/19 6:57 PM

## 2019-09-14 NOTE — Progress Notes (Signed)
[facility] Liaison Note  Notified by [facility] manager of patient/family request for [facility] Palliative services at [facility] after discharge.       Spoke  with patient's spouse Christia Reading to confirm interest and explain services. Christia Reading explained plan is to go to [facility] in [place] at some time. Patient had her last chemo treatment today.            [place] Palliative team will follow up with patient after discharge.       Please call with any hospice or palliative related questions.    Clementeen Hoof, RN, [facility] [phone]

## 2019-09-14 NOTE — Plan of Care (Signed)

## 2019-09-14 NOTE — Progress Notes (Signed)
Daily Progress Note   Patient Name: Elizabeth [name]       Date: 09/14/2019 DOB: Jan 10, 1956  Age: 64 y.o. MRN#: [id number] Attending Physician: Mariel Aloe, MD Primary Care Physician: [name], PA Admit Date: 08/29/2019  Reason for Consultation/Follow-up: Establishing goals of care  Subjective: I saw and examined Ms. Stenzel today.  Her daughters at the bedside.  We discussed plan for last treatment of radiation today followed by transition to skilled facility for trial of rehab.  Her daughter reports her main concern is increased swelling of left upper extremity related to her lymphedema.  This is a problem which she has had in the past and her daughter brought her home compression wrap and gloves in today.  We discussed prior treatments for lymphedema and that she had been followed before lymphedema clinic.  Discussed that I would see if lymphedema treatment is something that could also be focused on at skilled facility in addition to other rehab sessions.  Length of Stay: 16  Current Medications: Scheduled Meds:  . amLODipine  5 mg Oral Daily  . Chlorhexidine Gluconate Cloth  6 each Topical Daily  . dexamethasone  4 mg Oral Q8H  . gabapentin  300 mg Oral BID  . gabapentin  300 mg Oral Once  . levothyroxine  25 mcg Oral QAC breakfast  . metoCLOPramide (REGLAN) injection  5 mg Intravenous Q6H  . oxyCODONE  10 mg Oral Q12H  . polyethylene glycol  17 g Oral BID  . prednisoLONE acetate  1 drop Both Eyes Daily  . senna-docusate  2 tablet Oral BID    Continuous Infusions:   PRN Meds: bisacodyl, HYDROmorphone (DILAUDID) injection, lip balm, LORazepam, ondansetron **OR** ondansetron (ZOFRAN) IV, oxyCODONE-acetaminophen **AND** oxyCODONE, sodium chloride flush  Physical Exam           GEN: Awake and alert.  No distress Resp: No labored breathing CV: Regular Skin: Warm and dry Extremity: Left upper extremity with edema noted, particularly just distal to elbow  Vital Signs: BP (!) 141/82 (BP Location: Right Arm)   Pulse 91   Temp 97.6 F (36.4 C) (Oral)   Resp 20   Ht 5\' 3"  (1.6 m)   Wt 43.5 kg   SpO2 100%   BMI 17.01 kg/m  SpO2: SpO2: 100 % O2 Device: O2 Device:  Nasal Cannula O2 Flow Rate: O2 Flow Rate (L/min): 2 L/min  Intake/output summary:   Intake/Output Summary (Last 24 hours) at 09/14/2019 1127 Last data filed at 09/14/2019 1122 Gross per 24 hour  Intake 50 ml  Output --  Net 50 ml   LBM: Last BM Date: (pta; modified) Baseline Weight: Weight: 43.5 kg Most recent weight: Weight: 43.5 kg Flowsheet Rows     Most Recent Value  Intake Tab  Referral Department  Hospitalist  Unit at Time of Referral  Med/Surg Unit  Palliative Care Primary Diagnosis  Cancer  Date Notified  09/01/19  Palliative Care Type  New Palliative care  Reason for referral  Clarify Goals of Care  Date of Admission  08/29/19  Date first seen by Palliative Care  09/03/19  # of days Palliative referral response time  2 Day(s)  # of days IP prior to Palliative referral  3  Clinical Assessment  Psychosocial & Spiritual Assessment  Palliative Care Outcomes      Patient Active Problem List   Diagnosis Date Noted  . Palliative care by specialist   . Pleural effusion   . Vasogenic brain edema ([place])   . Brain metastasis ([place]) 08/29/2019  . Malignant neoplasm of lower-outer quadrant of left breast of female, estrogen receptor positive ([place]) 10/21/2018  . Goals of care, counseling/discussion 10/21/2018  . Bone metastasis ([place]) 12/22/2017  . Primary osteoarthritis of left hip 05/26/2017  . Osteoarthritis of left hip 05/23/2017  . Breast cancer of lower-outer quadrant of left female breast ([place]) 08/06/2012    Palliative Care Assessment & Plan   Patient Profile: 64 year old  female with metastatic breast cancer with newly discovered brain mets (currently getting radiation therapy) with continued functional decline.  She is deemed not a candidate for further systemic therapy.  Recommendations/Plan: -Pain:  Continue oxycodone p.o. or Dilaudid IV as needed for breakthrough pain. Now on scheduled low dose opioids as well. Continue current dose.  -Final dose radiation today.  Plan for transition to skilled facility for rehab. -Continue bowel regimen -[place] with daughter at bedside.  Primary concern of family at this time is increasing lymphedema.  I discussed with TOC to see if lymphedema treatment is something that can be requested in addition to rehab through PT services at skilled facility. -SNF rehab with palliative and med onc follow up in the outpatient setting is recommended.     Code Status:    Code Status Orders  (From admission, onward)         Start     Ordered   09/03/19 1552  Do not attempt resuscitation (DNR)  Continuous    Question Answer Comment  In the event of cardiac or respiratory ARREST Do not call a "code blue"   In the event of cardiac or respiratory ARREST Do not perform Intubation, CPR, defibrillation or ACLS   In the event of cardiac or respiratory ARREST Use medication by any route, position, wound care, and other measures to relive pain and suffering. May use oxygen, suction and manual treatment of airway obstruction as needed for comfort.      09/03/19 1551        Code Status History    Date Active Date Inactive Code Status Order ID Comments User Context   08/29/2019 2242 09/03/2019 1551 Full Code [id number]  Rise Patience, MD ED   05/26/2017 1021 05/28/2017 1500 Full Code [id number]  Leighton Parody, PA-C Inpatient   Advance Care Planning Activity  Advance Directive Documentation     Most Recent Value  Type of Advance Directive  Healthcare Power of Attorney  Pre-existing out of facility DNR order (yellow form or pink MOST  form)  --  "MOST" Form in Place?  --       Prognosis:   Guarded  Discharge Planning:  SNF rehab with palliative on discharge.   Care plan was discussed with patient and daughter. Also checked in with [facility] MD.   Thank you for allowing the Palliative Medicine Team to assist in the care of this patient.   Total Time 25 Prolonged Time Billed  No   Greater than 50%  of this time was spent counseling and coordinating care related to the above assessment and plan.  Micheline Rough, MD  Please contact Palliative Medicine Team phone at [phone] for questions and concerns.

## 2019-09-15 NOTE — TOC Transition Note (Signed)
Transition of Care [facility]) - CM/SW Discharge Note   Patient Details  Name: Elizabeth [name] MRN: [id number] Date of Birth: 10-Sep-1955  Transition of Care [place]) CM/SW Contact:  Trish Mage, LCSW Phone Number: 09/15/2019, 10:26 AM   Clinical Narrative:   Patient to d/c today.  Family to transport.  Nursing, please call report to [phone], room 209.  TOC sign off.    Final next level of care: Skilled Nursing Facility Barriers to Discharge: Barriers Resolved   Patient Goals and CMS Choice     Choice offered to / list presented to : Spouse  Discharge Placement                       Discharge Plan and Services   Discharge Planning Services: CM Consult Post Acute Care Choice: [place]                               Social Determinants of Health (SDOH) Interventions     Readmission Risk Interventions No flowsheet data found.

## 2019-09-15 NOTE — Progress Notes (Signed)
S: patient is feeling well this am, no nausea or vomiting, no chest pain or dyspnea.  O: T 97.9, BP 140/92, HR 90 and RR18.  Lungs clear to auscultation, abdomen soft, no lower extremity edema.  P: Patient will be discharge to SNF today and follow up with palliative care.   All questions were addressed.

## 2019-09-16 ENCOUNTER — Telehealth: Payer: Self-pay | Admitting: *Deleted

## 2019-09-16 ENCOUNTER — Encounter: Payer: Self-pay | Admitting: *Deleted

## 2019-09-16 DIAGNOSIS — G9050 Complex regional pain syndrome I, unspecified: Secondary | ICD-10-CM | POA: Diagnosis not present

## 2019-09-16 DIAGNOSIS — E441 Mild protein-calorie malnutrition: Secondary | ICD-10-CM | POA: Diagnosis not present

## 2019-09-16 DIAGNOSIS — C7981 Secondary malignant neoplasm of breast: Secondary | ICD-10-CM | POA: Diagnosis not present

## 2019-09-16 DIAGNOSIS — R7303 Prediabetes: Secondary | ICD-10-CM | POA: Diagnosis not present

## 2019-09-16 DIAGNOSIS — E039 Hypothyroidism, unspecified: Secondary | ICD-10-CM

## 2019-09-16 NOTE — Telephone Encounter (Signed)
Spoke with the patient's husband to go over taper instructions for his wife's steroid (dexamethasone).  He informed me that he would rather I speak with her nurse at [facility].  After speaking with the nurse I was advised to fax over taper instructions to fax # [phone]   Taper Instructions Starting 09/14/2019 Take 1 tablet (4 mg) by mouth twice daily.  Starting 09/27/2019 take 1/2 tablet (2 mg) by mouth twice daily.  Starting 10/04/2019 take 1/2 tablet (2 mg) by mouth daily.  Starting 10/11/2019 take 1/2 tablet (2mg ) by mouth every other day and then stop on 10/18/2019.   Instructions faxed to [place] station.  Will continue to follow as necessary.  Gloriajean Dell. Leonie Green, BSN

## 2019-09-17 ENCOUNTER — Other Ambulatory Visit: Payer: Self-pay | Admitting: *Deleted

## 2019-09-20 ENCOUNTER — Ambulatory Visit: Payer: Medicare Other

## 2019-09-20 ENCOUNTER — Other Ambulatory Visit: Payer: Medicare Other

## 2019-09-20 ENCOUNTER — Ambulatory Visit: Payer: Medicare Other | Admitting: Hematology and Oncology

## 2019-09-22 ENCOUNTER — Telehealth: Payer: Self-pay | Admitting: *Deleted

## 2019-09-22 NOTE — Telephone Encounter (Signed)
Received call from pt husband Christia Reading stating pt is currently a resident at [facility] skilled nursing facility in [place].  Pt spouse requesting to speak with MD regarding pt current treatment and future treatment plans.  RN will forward to MD to return call to pt.

## 2019-09-23 ENCOUNTER — Telehealth: Payer: Self-pay | Admitting: Hematology and Oncology

## 2019-09-23 NOTE — Telephone Encounter (Signed)
I discussed with the patient's husband who informed me that Eddie Dibbles is doing much better than when I had seen her in the hospital.  He wants to know if immunotherapy is still an option.  I offered them an appointment to see me on 10/05/2019 to see how far she had come and then make a determination on additional systemic therapy.

## 2019-09-30 ENCOUNTER — Ambulatory Visit: Payer: Medicare Other

## 2019-10-04 NOTE — Progress Notes (Addendum)
Patient Care Team: [name], [place] as PCP - General (Nurse Practitioner)  DIAGNOSIS:    ICD-10-CM   1. Malignant neoplasm of lower-outer quadrant of left breast of female, estrogen receptor positive ([name])  C50.512    Z17.0     SUMMARY OF ONCOLOGIC HISTORY: Oncology History  Breast cancer of lower-outer quadrant of left female breast ([name])  12/13/2008 Initial Diagnosis   Left breast biopsy: Invasive ductal carcinoma ER 90% percent, PR 41%, Ki-67 11%, HER-2 negative ratio 1, Oncotype DX score 23, ROR15%   01/27/2009 - 03/10/2009 Neo-Adjuvant Chemotherapy   Neoadjuvant FEC 4; participant in the "bed sheets" study.   05/12/2009 -  Anti-estrogen oral therapy   Femara 2.5 mg daily   05/22/2009 Surgery   Bilateral mastectomy stage IIB T2 N0 M0 IDC left breast grade 1, 2.5 cm, ER 99%, PR 41%, Ki-67 11%, HER-2 negative   12/10/2009 Surgery   Breast reconstruction surgery   11/14/2017 Relapse/Recurrence   Mid back pain: MRI revealed T11 severe pathologic fracture, abnormal signal T10, T11 and T12, moderate canal stenosis at T11   12/03/2017 PET scan   Hypermetabolic metastatic breast cancer involving thoracic and upper abdominal lymph nodes, spine and ribs. There may be hypermetabolic adenopathy in the left neck; Hypermetabolic lymph node versus intramuscular metastasis involving the medial left pectoralis musculature Pathologic T11 fracture     12/11/2017 Procedure   Biopsy of T11 vertebral body lytic lesion: Metastatic breast adenocarcinoma ER positive, PR negative   12/19/2017 - 12/26/2017 Radiation Therapy   Palliative XRT to T 11   01/12/2018 - 10/26/2018 Anti-estrogen oral therapy   Patient could not tolerate Faslodex because of severe hypotension and severe pain in the legs after injections.  Ibrance with letrozole starting 01/12/2018   10/27/2018 -  Chemotherapy   Palliative chemotherapy with Halaven   05/03/2019 -  Anti-estrogen oral therapy   Alpelisib with Faslodex     08/29/2019 Progression   She was admitted from 08/29/19-09/14/19 after presenting to the ED for dizziness x3-4 days. Head CT and MRI showed multiple metastatic lesions mostly concentrated in the cerebellum with mass-effect. She was started on decadron and underwent 10 radiation treatments.    Malignant neoplasm of lower-outer quadrant of left breast of female, estrogen receptor positive ([name])  05/02/2018 Miscellaneous   Alpelisib with Faslodex discontinued 08/23/2019 for progression   10/21/2018 Initial Diagnosis   Malignant neoplasm of lower-outer quadrant of left breast of female, estrogen receptor positive ([name])   10/27/2018 - 05/02/2019 Chemotherapy   The patient had eriBULin mesylate (HALAVEN) 1.65 mg in sodium chloride 0.9 % 100 mL chemo infusion, 1.1 mg/m2 = 2.1 mg, Intravenous,  Once, 9 of 10 cycles Dose modification: 1.1 mg/m2 (original dose 1.4 mg/m2, Cycle 1, Reason: Other (see comments), Comment: CrCL < 50) Administration: 1.65 mg (10/27/2018), 1.65 mg (11/03/2018), 1.65 mg (11/17/2018), 1.65 mg (11/24/2018), 1.65 mg (12/08/2018), 1.65 mg (12/15/2018), 1.65 mg (12/29/2018), 1.65 mg (01/18/2019), 1.65 mg (02/08/2019), 1.65 mg (03/01/2019), 1.65 mg (03/22/2019), 1.65 mg (04/12/2019)  for chemotherapy treatment.    11/17/2018 - 12/03/2018 Radiation Therapy   Palliative radiation to the T5 location of the spine and right femur, 35 Gy in 14 fractions   04/05/2019 Miscellaneous   Guardant 360:PIK3CA mutation (alpelisib), NF1 S2 467 (everolimus) ARID1A S1791 (response to neratinib, olaparib, rucaparib, talazoparib), T p53 and SMAD4 Q534, TMB 35.41 mutations /MB, no evidence of MSI high   08/29/2019 Progression   She was admitted from 08/29/19-09/14/19 after presenting to the ED for dizziness x3-4  days. Head CT and MRI showed multiple metastatic lesions mostly concentrated in the cerebellum with mass-effect. She was started on decadron and underwent 10 radiation treatments.    08/30/2019 - 08/30/2019 Chemotherapy    The patient had pembrolizumab (KEYTRUDA) 200 mg in sodium chloride 0.9 % 50 mL chemo infusion, 200 mg, Intravenous, Once, 0 of 6 cycles  for chemotherapy treatment.      CHIEF COMPLIANT: Follow-up of metastatic breast cancer, recent hospitalization  INTERVAL HISTORY: Elizabeth [name] is a 64 y.o. with above-mentioned history of metastatic breast cancerwhohas had recurrent pleural effusions.Sheis currently on treatment with pembrolizumab immunotherapy.She was admitted from 08/29/19-09/14/19 after presenting to the ED for dizziness x3-4 days. Head CT and MRI showed multiple metastatic lesions mostly concentrated in the cerebellum with mass-effect. She was started on decadron and underwent 10 radiation treatments. She presents to the clinic today for follow-up.   ALLERGIES:  is allergic to shellfish allergy and sulfa antibiotics.  MEDICATIONS:  Current Outpatient Medications  Medication Sig Dispense Refill  . ALPELISIB, 300 MG DAILY DOSE, PO Take 1 capsule by mouth in the morning and at bedtime.    Marland Kitchen amLODipine (NORVASC) 5 MG tablet Take 1 tablet (5 mg total) by mouth daily.    . Biotin 10000 MCG TABS Take 10,000 mcg by mouth daily.     . bisacodyl (DULCOLAX) 10 MG suppository Place 1 suppository (10 mg total) rectally daily as needed for moderate constipation. 12 suppository 0  . Cholecalciferol (VITAMIN D PO) Take 5,000 Units by mouth daily.     Marland Kitchen dexamethasone (DECADRON) 4 MG tablet Take 1 tablet (4 mg total) by mouth 2 (two) times daily.    Marland Kitchen gabapentin (NEURONTIN) 300 MG capsule Take 1 capsule (300 mg total) by mouth 2 (two) times daily. 180 capsule 0  . letrozole (FEMARA) 2.5 MG tablet Take 1 tablet (2.5 mg total) by mouth daily. 90 tablet 3  . levothyroxine (SYNTHROID, LEVOTHROID) 25 MCG tablet Take 25 mcg by mouth daily before breakfast.     . loperamide (IMODIUM) 2 MG capsule 1 to 2 PO QID prn diarrhea (Patient taking differently: Take 2-4 mg by mouth as needed for diarrhea or loose  stools. 1 to 2 PO QID prn diarrhea) 30 capsule 22  . magic mouthwash w/lidocaine SOLN Take 5 mLs by mouth 3 (three) times daily as needed for mouth pain. 100 mL 0  . Multiple Vitamin (MULTIVITAMIN) tablet Take 1 tablet by mouth daily.    Marland Kitchen omeprazole (PRILOSEC) 20 MG capsule Take 1 capsule (20 mg total) by mouth daily. (Patient taking differently: Take 20 mg by mouth daily as needed (indigestion). ) 90 capsule 0  . ondansetron (ZOFRAN) 8 MG tablet Take 1 tablet (8 mg total) by mouth every 8 (eight) hours as needed for nausea. 30 tablet 3  . oxyCODONE (OXYCONTIN) 10 mg 12 hr tablet Take 1 tablet (10 mg total) by mouth every 12 (twelve) hours. 10 tablet 0  . oxyCODONE-acetaminophen (PERCOCET) 10-325 MG tablet Take 1 tablet by mouth every 8 (eight) hours as needed for pain. 10 tablet 0  . polyethylene glycol (MIRALAX / GLYCOLAX) 17 g packet Take 17 g by mouth 2 (two) times daily. 14 each 0  . prednisoLONE acetate (PRED FORTE) 1 % ophthalmic suspension Place 1 drop into both eyes daily.    Marland Kitchen senna-docusate (SENOKOT-S) 8.6-50 MG tablet Take 2 tablets by mouth 2 (two) times daily.    . simethicone (GAS-X) 80 MG chewable tablet Chew 1 tablet (80  mg total) by mouth every 6 (six) hours as needed for flatulence. 120 tablet 2   No current facility-administered medications for this visit.    PHYSICAL EXAMINATION: ECOG PERFORMANCE STATUS: 3 - Symptomatic, >50% confined to bed  Vitals:   10/05/19 1306  BP: 100/62  Pulse: (!) 126  Resp: 18  Temp: 98.7 F (37.1 C)  SpO2: 97%   Filed Weights   10/05/19 1306  Weight: 100 lb 3.2 oz (45.5 kg)    LABORATORY DATA:  I have reviewed the data as listed CMP Latest Ref Rng & Units 09/11/2019 09/08/2019 09/07/2019  Glucose 70 - 99 mg/dL 122(H) 128(H) 187(H)  BUN 8 - 23 mg/dL '15 12 13  ' Creatinine 0.44 - 1.00 mg/dL 0.58 0.59 0.68  Sodium 135 - 145 mmol/L 135 135 134(L)  Potassium 3.5 - 5.1 mmol/L 3.5 3.5 3.7  Chloride 98 - 111 mmol/L 101 102 102  CO2 22 - 32  mmol/L '26 26 25  ' Calcium 8.9 - 10.3 mg/dL 8.4(L) 8.0(L) 8.1(L)  Total Protein 6.5 - 8.1 g/dL - 5.4(L) 5.3(L)  Total Bilirubin 0.3 - 1.2 mg/dL - 0.5 0.3  Alkaline Phos 38 - 126 U/L - 76 72  AST 15 - 41 U/L - 33 31  ALT 0 - 44 U/L - 22 23    Lab Results  Component Value Date   WBC 11.7 (H) 09/11/2019   HGB 11.4 (L) 09/11/2019   HCT 34.3 (L) 09/11/2019   MCV 96.1 09/11/2019   PLT 494 (H) 09/11/2019   NEUTROABS 9.8 (H) 09/08/2019    ASSESSMENT & PLAN:  Malignant neoplasm of lower-outer quadrant of left breast of female, estrogen receptor positive ([facility]) Left breast cancer invasive ductal carcinoma 2.5 cm in size grade 1, ER 99%, PR 41%, Ki-67 11%, HER-2 negative T2 N0 M0 stage II a status post neoadjuvant chemotherapy followed by surgery February 2011and wason antiestrogen therapy with Femara from January 2011-July 2017  Treatment : 1. Palliative radiation to T11 vertebral body and the pectoralis muscle: 12/20/17-12/26/17 2. Ibrance with letrozole along with Delton See or Zometadiscontinued July 2020 due to progression Patient could not tolerate Faslodex because of intense bone pain as well as hypotension. 3.Halaven started 10/27/2018,completed 9 cycles as of 04/12/2019 discontinued for progression of disease.  04/05/2019:Guardant 360:PIK3CA mutation (alpelisib), NF1 S2 467 (everolimus) ARID1A S1791 (response to neratinib, olaparib, rucaparib, talazoparib), T p53 and SMAD4 Q534, TMB 35.41 Based on these results, it appears that she could respond to alpelisib as well as everolimus and olaparib as well as possibly immunotherapy. ----------------------------------------------------------------------------------------------------------------------------------------- Hospitalizations 08/29/2019-09/14/2019 metastatic disease to the brain with vasogenic edema Status post radiation and steroids Thoracentesis 700 mL    Previously we recommended hospice care because of her declining performance  status. Her energy and performance status have improved slightly.  However she is still extremely weak. Her husband wants to discuss about additional treatment options.  We previously discussed the role of immunotherapy.  I once again provided him with literature on immunotherapy and its risks and benefits.  I expect that she would have very low likelihood of benefit.  However her husband wants her to consider taking it in the rare event that she responds to it.  She will think about it and will inform us of her decision.   No orders of the defined types were placed in this encounter.  The patient has a good understanding of the overall plan. she agrees with it. she will call with any problems that may develop before the next  visit here.  Total time spent: 30 mins including face to face time and time spent for planning, charting and coordination of care  Nicholas Lose, MD 10/05/2019  I, Cloyde Reams Dorshimer, am acting as scribe for Dr. Nicholas Lose.  I have reviewed the above documentation for accuracy and completeness, and I agree with the above.     Addendum: 10/20/2019: After much discussion with the hospice care team, patient decided that she does not want hospice at this time and she wants to proceed with immunotherapy treatment.  She fully understands that the likelihood of benefit is very low and that it may not substantially improve either the quality or quantity of life.

## 2019-10-05 ENCOUNTER — Other Ambulatory Visit: Payer: Self-pay

## 2019-10-05 ENCOUNTER — Inpatient Hospital Stay: Payer: Medicare Other | Attending: Hematology and Oncology | Admitting: Hematology and Oncology

## 2019-10-05 VITALS — BP 100/62 | HR 126 | Temp 98.7°F | Resp 18 | Ht 63.0 in | Wt 100.2 lb

## 2019-10-05 DIAGNOSIS — Z923 Personal history of irradiation: Secondary | ICD-10-CM | POA: Insufficient documentation

## 2019-10-05 DIAGNOSIS — Z79899 Other long term (current) drug therapy: Secondary | ICD-10-CM | POA: Insufficient documentation

## 2019-10-05 DIAGNOSIS — C50512 Malignant neoplasm of lower-outer quadrant of left female breast: Secondary | ICD-10-CM | POA: Diagnosis present

## 2019-10-05 DIAGNOSIS — Z170 Estrogen receptor positive status [ER+]: Secondary | ICD-10-CM | POA: Insufficient documentation

## 2019-10-05 DIAGNOSIS — Z9221 Personal history of antineoplastic chemotherapy: Secondary | ICD-10-CM | POA: Diagnosis not present

## 2019-10-05 DIAGNOSIS — Z7189 Other specified counseling: Secondary | ICD-10-CM

## 2019-10-05 NOTE — Assessment & Plan Note (Signed)
Left breast cancer invasive ductal carcinoma 2.5 cm in size grade 1, ER 99%, PR 41%, Ki-67 11%, HER-2 negative T2 N0 M0 stage II a status post neoadjuvant chemotherapy followed by surgery February 2011and wason antiestrogen therapy with Femara from January 2011-July 2017  Treatment : 1. Palliative radiation to T11 vertebral body and the pectoralis muscle: 12/20/17-12/26/17 2. Ibrance with letrozole along with Delton See or Zometadiscontinued July 2020 due to progression Patient could not tolerate Faslodex because of intense bone pain as well as hypotension. 3.Halaven started 10/27/2018,completed 9 cycles as of 04/12/2019 discontinued for progression of disease.  04/05/2019:Guardant 360:PIK3CA mutation (alpelisib), NF1 S2 467 (everolimus) ARID1A S1791 (response to neratinib, olaparib, rucaparib, talazoparib), T p53 and SMAD4 Q534, TMB 35.41 Based on these results, it appears that she could respond to alpelisib as well as everolimus and olaparib as well as possibly immunotherapy. ----------------------------------------------------------------------------------------------------------------------------------------- Hospitalizations 08/29/2019-09/14/2019 metastatic disease to the brain with vasogenic edema Status post radiation and steroids Thoracentesis 700 mL Chronic respiratory failure with hypoxia requiring oxygen  Previously we recommended hospice care because of her declining performance status.

## 2019-10-06 ENCOUNTER — Other Ambulatory Visit: Payer: Self-pay | Admitting: Hematology and Oncology

## 2019-10-06 ENCOUNTER — Telehealth: Payer: Self-pay | Admitting: *Deleted

## 2019-10-06 MED ORDER — VENLAFAXINE HCL ER 37.5 MG PO CP24: 37.5000 mg | ORAL_CAPSULE | Freq: Every day | ORAL | 3 refills | Status: AC

## 2019-10-06 NOTE — Progress Notes (Signed)
Elizabeth [name] has been dealing with depression and anxiety and has requested antidepressant to help with her emotions.  I sent a prescription for Effexor 37.5 mg daily.

## 2019-10-06 NOTE — Telephone Encounter (Signed)
Received call from pt husband Elizabeth [name] requesting to speak with MD.  States he has several questions and concerns to address with Dr. Lindi Adie.  RN will forward message to MD.

## 2019-10-07 ENCOUNTER — Telehealth: Payer: Self-pay | Admitting: Hematology and Oncology

## 2019-10-07 NOTE — Telephone Encounter (Signed)
No 6/22 los, no changes made to patient schedule

## 2019-10-11 ENCOUNTER — Other Ambulatory Visit: Payer: Medicare Other

## 2019-10-11 ENCOUNTER — Ambulatory Visit: Payer: Medicare Other | Admitting: Hematology and Oncology

## 2019-10-11 ENCOUNTER — Ambulatory Visit: Payer: Medicare Other

## 2019-10-19 ENCOUNTER — Telehealth: Payer: Self-pay | Admitting: Adult Health Nurse Practitioner

## 2019-10-19 ENCOUNTER — Other Ambulatory Visit: Payer: Self-pay | Admitting: *Deleted

## 2019-10-19 DIAGNOSIS — C50512 Malignant neoplasm of lower-outer quadrant of left female breast: Secondary | ICD-10-CM

## 2019-10-19 DIAGNOSIS — Z170 Estrogen receptor positive status [ER+]: Secondary | ICD-10-CM

## 2019-10-19 NOTE — Progress Notes (Signed)
Per MD request, RN placed order for hospice and called Drue Dun RN with [facility] to establish pt.

## 2019-10-19 NOTE — Progress Notes (Signed)
Received call from [place] with [facility] stating pt is declining hospice care at this time and would like to proceed with immunotherapy at the [facility].  Pt would qualify for palliative care while receiving immunotherapy.  Referral placed and faxed to [facility] for palliative care.

## 2019-10-19 NOTE — Telephone Encounter (Signed)
Scheduled [facility] Palliative visit for 11-02-19 at 9:00.

## 2019-10-20 ENCOUNTER — Telehealth: Payer: Self-pay | Admitting: *Deleted

## 2019-10-20 ENCOUNTER — Other Ambulatory Visit: Payer: Self-pay | Admitting: Hematology and Oncology

## 2019-10-20 IMAGING — CT CT CHEST W/ CM
2 of 5 series · 11 of 36 positions shown, 13 images · IV contrast (APPLIED)
Comparison: 10/19/2018 CT chest, abdomen and pelvis.

CLINICAL DATA: Stage IV metastatic breast cancer with ongoing
chemotherapy. History of bilateral mastectomy. Restaging.

EXAM:
CT CHEST, ABDOMEN, AND PELVIS WITH CONTRAST
TECHNIQUE: Multidetector CT imaging of the chest, abdomen and pelvis was
performed following the standard protocol during bolus
administration of intravenous contrast.
CONTRAST:  100mL OMNIPAQUE IOHEXOL 300 MG/ML SOLN, 30mL OMNIPAQUE
IOHEXOL 300 MG/ML SOLN

[Series 2: cap with · axial · 0.78mm/px · z∈[-597,-102]mm · 8 of 123 slices shown, 10 images]
[im 12/123  mediastinal]
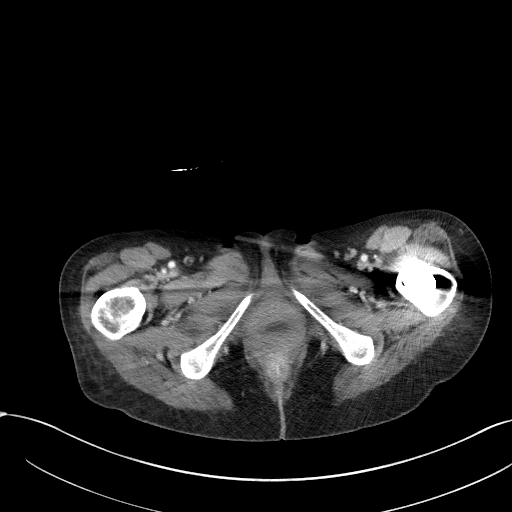
[im 12/123  lung]
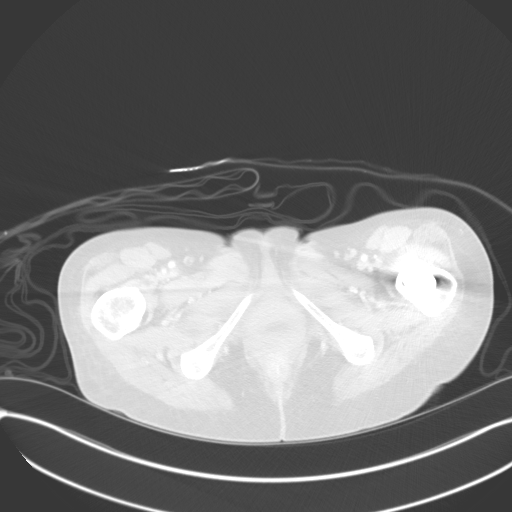
[im 23/123  lung]
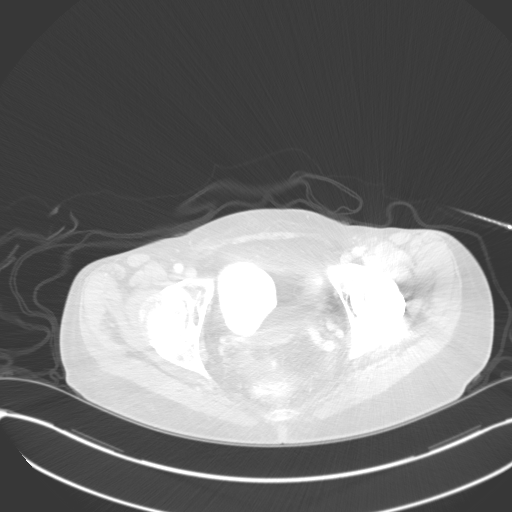
[im 45/123  lung]
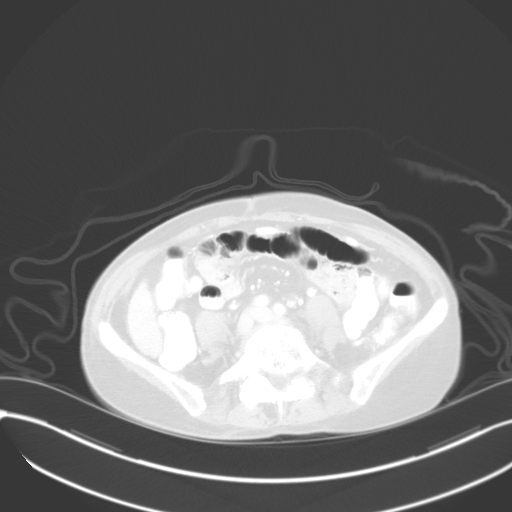
[im 56/123  lung]
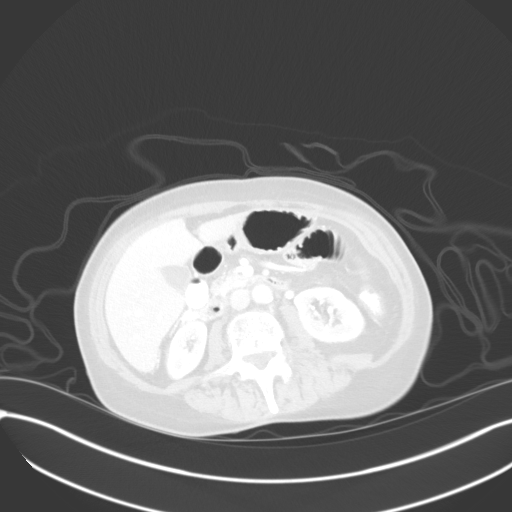
[im 67/123  mediastinal]
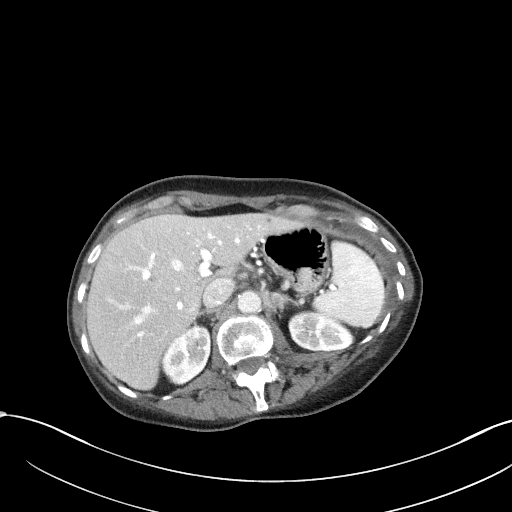
[im 67/123  lung]
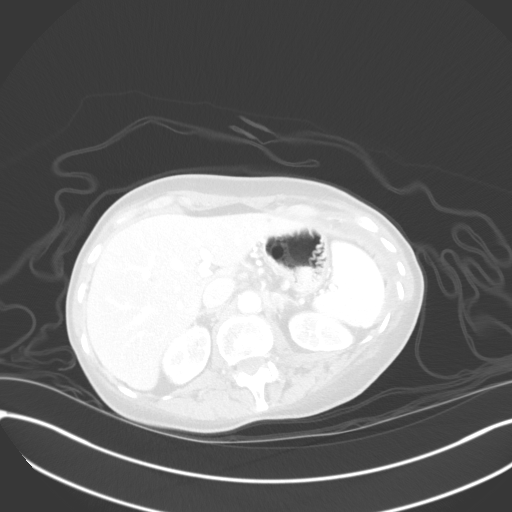
[im 78/123  lung]
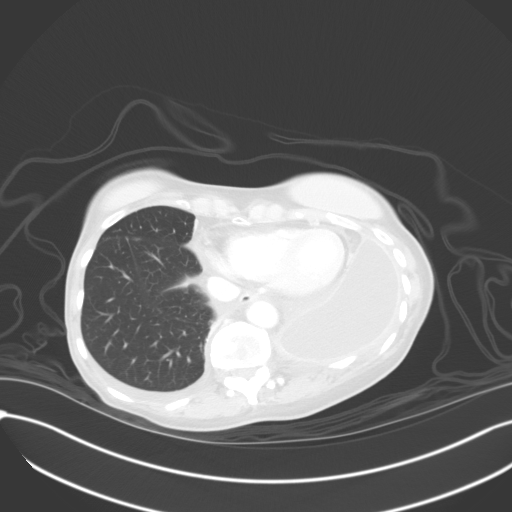
[im 100/123  lung]
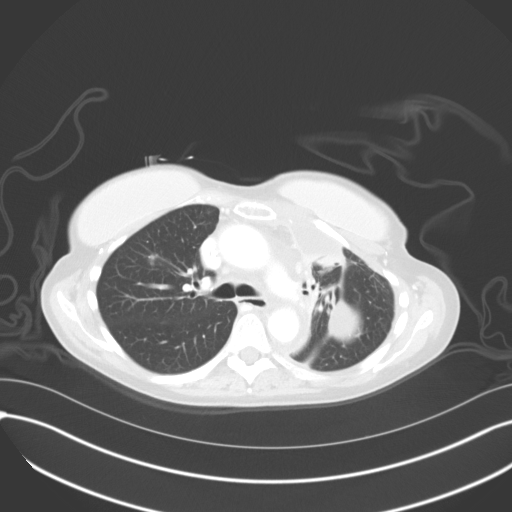
[im 111/123  lung]
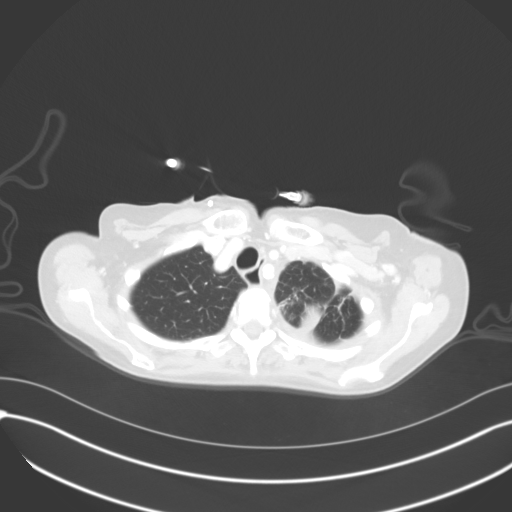

[Series 4: coronals · coronal · 0.71mm/px · 3 of 117 slices shown]
[im 24/117  lung]
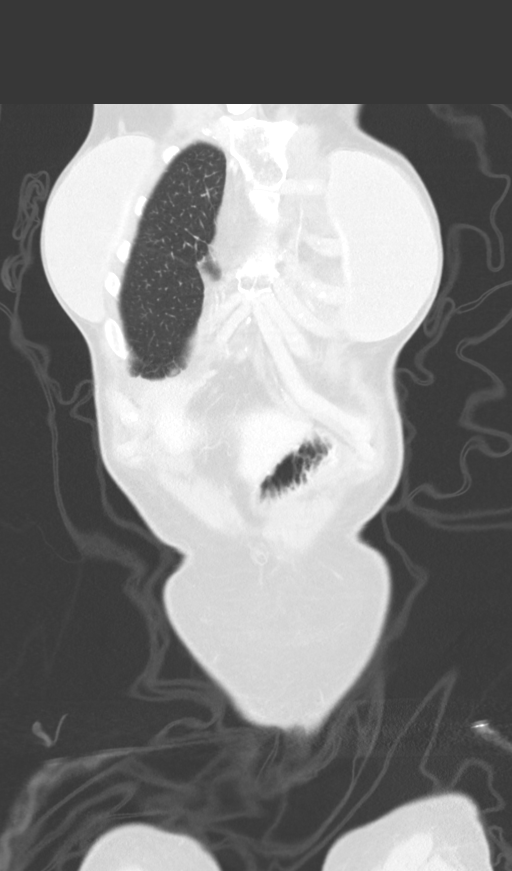
[im 47/117  lung]
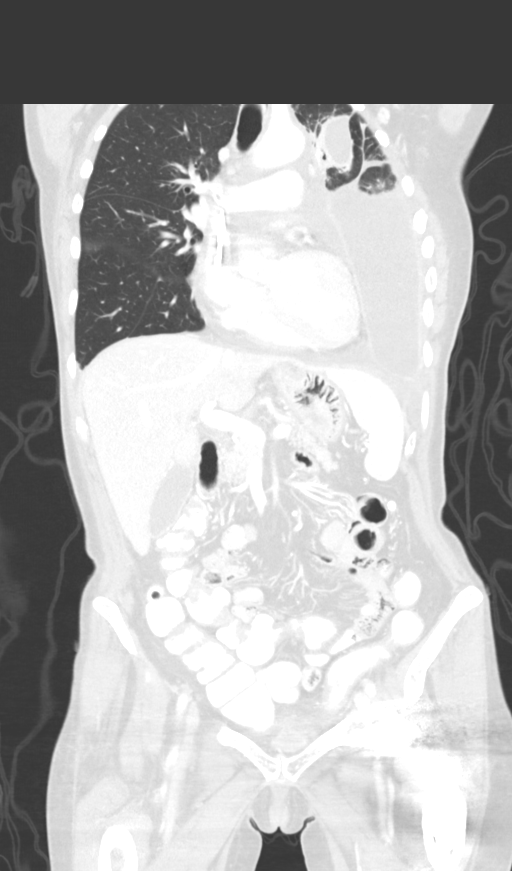
[im 70/117  lung]
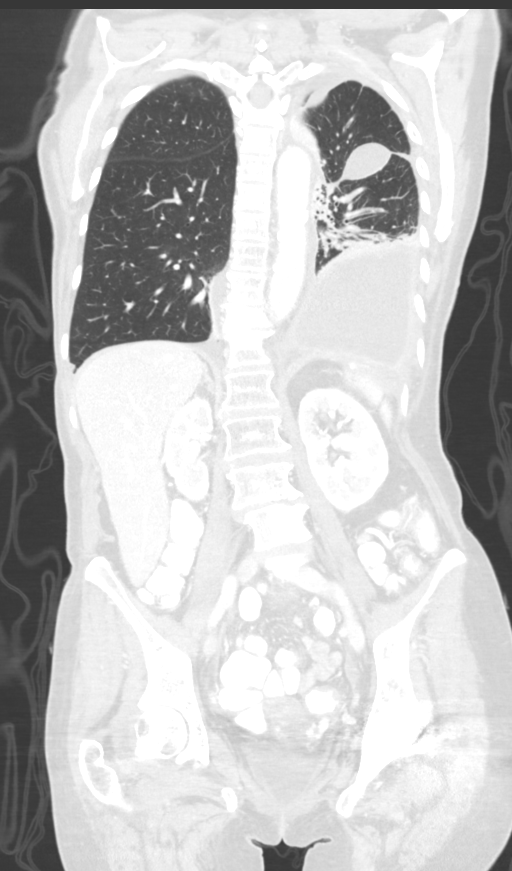

[11 of 36 positions shown; findings below may reference images not displayed]

FINDINGS: CT CHEST FINDINGS

Cardiovascular: Normal heart size. No significant pericardial
effusion/thickening. Left anterior descending coronary
atherosclerosis. Atherosclerotic nonaneurysmal thoracic aorta.
Normal caliber pulmonary arteries. No central pulmonary emboli.

Mediastinum/Nodes: No discrete thyroid nodules. Unremarkable
esophagus. Surgical clips are noted in the left axilla. No
pathologically enlarged axillary lymph nodes. Previously noted
enlarged left axillary nodes have decreased in size. For example a
0.6 cm left axillary node (series 2/image 16) is decreased from
cm 10/19/2018 CT. Heterogeneously enhancing 0.5 cm left
pericardiophrenic node (series 2/image 45) is slightly decreased
from 0.7 cm. Heterogeneous enhancing 0.9 cm inferior left
pericardiophrenic node (series 2/image 55), previously 0.9 cm,
stable. Left internal mammary 0.4 cm node (series 2/image 28) is
decreased from 0.9 cm. Subcarinal 0.9 cm node (series 2/image 30) is
decreased from 1.2 cm. Previously visualized mildly enlarged 1.4 cm
right hilar node is decreased to 0.5 cm (series 2/image 27). No new
pathologically enlarged mediastinal or hilar nodes.

Lungs/Pleura: No pneumothorax. Trace dependent right pleural
effusion is not appreciably changed. Loculated predominantly basilar
moderate left pleural effusion is overall decreased with stable
circumferential smooth left pleural thickening. Small loculated left
pleural effusion component along left major fissure is slightly
increased. Interval removal of left pleural drain. Bandlike
consolidation with associated volume loss in left upper and left
lower lobes, not substantially changed most compatible with
compressive atelectasis or scarring. New 1.4 cm nodular focus of
consolidation in the peripheral left mid lung adjacent to the
loculated fissural left pleural effusion (series 6/image 52). New
sub solid 0.8 cm right upper lobe nodule (series 6/image 50). No
additional significant pulmonary nodules.

Musculoskeletal: Widespread patchy sclerotic osseous lesions
throughout the thoracic spine, bilateral ribs, left endometrium and
inferior left scapula, generally increased in sclerosis and stable
in size, as best appreciated in the posterior elements on the right
at T5, within T4 vertebral lesion with associated slightly worsened
moderate vertebral compression fracture and in the lateral left
second rib. Marked pathologic T11 vertebral compression fracture is
unchanged. No appreciable new osseous lesions in the chest. Stable
intact bilateral breast prostheses.

CT ABDOMEN PELVIS FINDINGS

Hepatobiliary: Normal liver size. Stable hypodense 1.7 cm
subcapsular focus along the intersegmental fissure in the segment 4B
left liver lobe (series 2/image 65). Several subcentimeter hypodense
lesions scattered throughout the liver, too small to characterize,
all stable. No appreciable new liver lesions. Normal gallbladder
with no radiopaque cholelithiasis. No biliary ductal dilatation.

Pancreas: Normal, with no mass or duct dilation.

Spleen: Normal size. No mass.

Adrenals/Urinary Tract: Normal right adrenal. Infiltrative upper
left retroperitoneal 2.0 x 1.8 cm nodule intimately associated with
the left adrenal gland (series 2/image 50), decreased from 2.8 x
cm. No hydronephrosis. Subcentimeter hypodense upper right renal
cortical lesion is too small to characterize and unchanged,
considered benign. No new renal lesions. Small cystocele. Bladder
obscured by streak artifact from left hip hardware and otherwise
grossly unremarkable.

Stomach/Bowel: Normal non-distended stomach. Normal caliber small
bowel with no small bowel wall thickening. Normal appendix. Oral
contrast transits to rectum. Normal large bowel with no
diverticulosis, large bowel wall thickening or pericolonic fat
stranding.

Vascular/Lymphatic: Atherosclerotic nonaneurysmal abdominal aorta.
Patent portal, splenic, hepatic and renal veins. No new
pathologically enlarged lymph nodes in the abdomen or pelvis.

Reproductive: Grossly normal uterus.  No adnexal mass.

Other: No pneumoperitoneum, ascites or focal fluid collection.

Musculoskeletal: Sclerotic osseous lesions scattered in the L4 and
L5 vertebral bodies, upper sacrum and medial right iliac bone are
increased in sclerosis and appear stable in size. No appreciable new
osseous lesions. Moderate lumbar spondylosis.
IMPRESSION: 1. Favor positive response to therapy. Widespread sclerotic osseous
metastases are increased in sclerosis, without appreciable new or
enlarging osseous lesions. Left axillary, mediastinal and right
hilar lymphadenopathy has decreased. Infiltrative metastasis in the
upper left retroperitoneum involving the left adrenal gland is
decreased.
2. No definite new or progressive metastatic disease. Two new small
pulmonary nodules are indeterminate, potentially benign
inflammatory/atelectatic nodules, recommend attention on follow-up
chest CT in 3 months.
3. Moderate loculated left pleural effusion is overall decreased in
size. Stable left pleural thickening. Stable trace dependent right
pleural effusion.
4. Aortic Atherosclerosis ([id number]). Additional chronic findings
as detailed.

## 2019-10-20 MED ORDER — LIDOCAINE-PRILOCAINE 2.5-2.5 % EX CREA: TOPICAL_CREAM | CUTANEOUS | 3 refills | Status: AC

## 2019-10-20 NOTE — Telephone Encounter (Signed)
Received call from pt stating she would like to proceed with treatment.  RN will notify MD of pt decision.

## 2019-10-20 NOTE — Addendum Note (Signed)
Addended by: Nicholas Lose on: 10/20/2019 08:22 AM   Modules accepted: Orders

## 2019-10-20 NOTE — Progress Notes (Signed)
DISCONTINUE OFF PATHWAY REGIMEN - Breast   [id number]:Pembrolizumab 200 mg q21 Days:   A cycle is 21 days:     Pembrolizumab   **Always confirm dose/schedule in your pharmacy ordering system**  REASON: Disease Progression PRIOR TREATMENT: Off Pathway: Pembrolizumab 200 mg q21 Days TREATMENT RESPONSE: Unable to Evaluate  START OFF PATHWAY REGIMEN - Breast   [id number]:Pembrolizumab 200 mg q21 Days:   A cycle is 21 days:     Pembrolizumab   **Always confirm dose/schedule in your pharmacy ordering system**  Patient Characteristics: Distant Metastases or Locoregional Recurrent Disease - Unresected or Locally Advanced Unresectable Disease Progressing after Neoadjuvant and Local Therapies, HER2 Negative/Unknown/Equivocal, ER Positive, Chemotherapy, Third Line and Beyond, Prior or  Contraindicated Anthracycline and Prior Eribulin Therapeutic Status: Distant Metastases BRCA Mutation Status: Absent ER Status: Positive (+) HER2 Status: Negative (-) PR Status: Positive (+) Line of Therapy: Third Line and Beyond Intent of Therapy: Non-Curative / Palliative Intent, Discussed with Patient

## 2019-10-21 ENCOUNTER — Telehealth: Payer: Self-pay | Admitting: *Deleted

## 2019-10-29 ENCOUNTER — Ambulatory Visit: Payer: Medicare Other | Admitting: Hematology and Oncology

## 2019-10-29 ENCOUNTER — Ambulatory Visit: Payer: Medicare Other

## 2019-10-29 ENCOUNTER — Other Ambulatory Visit: Payer: Medicare Other

## 2019-11-02 ENCOUNTER — Other Ambulatory Visit: Payer: Self-pay | Admitting: Adult Health Nurse Practitioner

## 2019-11-14 NOTE — Telephone Encounter (Signed)
Received call from pt husband Tim that pt passed away last night in her sleep.  RN expressed deepest condolences to pt husband and all future apts canceled.

## 2019-11-14 DEATH — deceased

## 2019-11-19 NOTE — Progress Notes (Signed)
  Radiation Oncology         [phone] ________________________________  Name: Elizabeth [name] MRN: [id number]  Date: 09/14/2019  DOB: 1956/01/01  End of Treatment Note  Diagnosis:   brain metastasis   Indication for treatment::  palliative       Radiation treatment dates:   08/31/19 - 09/14/19  Site/dose:   The patient was treated with a course of whole brain radiation therapy to a dose of 30 Gy in 10 fractions.  This was accomplished using a 2 field technique with additional reduced fields as necessary to improve dose homogeneity.  Narrative: The patient tolerated radiation treatment relatively well.   No unexpected difficulties in terms of acute toxicity.  The skin held up to treatment fairly well.  Plan: The patient has completed radiation treatment. The patient will return to radiation oncology clinic for routine followup in one month. I advised the patient to call or return sooner if they have any questions or concerns related to their recovery or treatment. ________________________________  Jodelle Gross, M.D., Ph.D.

## 2021-12-20 ENCOUNTER — Telehealth: Payer: Self-pay | Admitting: *Deleted

## 2021-12-20 NOTE — Telephone Encounter (Signed)
Received call from patients daughter Levada Dy requesting advice if pt ever had genetic testing completed.  States she is concerned for her and her sisters chance of developing breast cancer.  After chart investigation, it was found that pt declined genetic testing.  RN offered Levada Dy and Estill Bamberg appt with genetic counselor to assess their risk and possibly undergo genetic testing.  Levada Dy requesting last office visit note from MD to review and decide if they would like to undergo counseling.  RN mailed recent office note to address on file.

## 2023-03-05 NOTE — Telephone Encounter (Signed)
Telephone call
